# Patient Record
Sex: Female | Born: 1935 | Race: White | Hispanic: No | Smoking: Never smoker
Health system: Southern US, Community
[De-identification: ages and names within clinical notes are randomized; demographics above are authoritative.]

## PROBLEM LIST (undated history)

## (undated) DIAGNOSIS — I10 Essential (primary) hypertension: Secondary | ICD-10-CM

## (undated) DIAGNOSIS — I370 Nonrheumatic pulmonary valve stenosis: Secondary | ICD-10-CM

## (undated) DIAGNOSIS — I509 Heart failure, unspecified: Secondary | ICD-10-CM

## (undated) DIAGNOSIS — I442 Atrioventricular block, complete: Secondary | ICD-10-CM

## (undated) DIAGNOSIS — I4892 Unspecified atrial flutter: Secondary | ICD-10-CM

## (undated) DIAGNOSIS — F329 Major depressive disorder, single episode, unspecified: Secondary | ICD-10-CM

## (undated) DIAGNOSIS — F419 Anxiety disorder, unspecified: Secondary | ICD-10-CM

## (undated) DIAGNOSIS — R0602 Shortness of breath: Secondary | ICD-10-CM

## (undated) DIAGNOSIS — H409 Unspecified glaucoma: Secondary | ICD-10-CM

## (undated) DIAGNOSIS — F32A Depression, unspecified: Secondary | ICD-10-CM

## (undated) DIAGNOSIS — Z8774 Personal history of (corrected) congenital malformations of heart and circulatory system: Secondary | ICD-10-CM

## (undated) HISTORY — PX: AORTIC VALVE REPAIR: SHX6306

## (undated) HISTORY — DX: Personal history of (corrected) congenital malformations of heart and circulatory system: Z87.74

## (undated) HISTORY — DX: Unspecified atrial flutter: I48.92

## (undated) HISTORY — DX: Nonrheumatic pulmonary valve stenosis: I37.0

## (undated) HISTORY — DX: Unspecified glaucoma: H40.9

## (undated) HISTORY — PX: OTHER SURGICAL HISTORY: SHX169

---

## 1983-12-22 HISTORY — PX: OTHER SURGICAL HISTORY: SHX169

## 2000-05-03 ENCOUNTER — Encounter: Admission: RE | Admit: 2000-05-03 | Discharge: 2000-05-03 | Payer: Self-pay | Admitting: Family Medicine

## 2000-05-03 ENCOUNTER — Encounter: Payer: Self-pay | Admitting: Family Medicine

## 2001-05-04 ENCOUNTER — Encounter: Payer: Self-pay | Admitting: Family Medicine

## 2001-05-04 ENCOUNTER — Encounter: Admission: RE | Admit: 2001-05-04 | Discharge: 2001-05-04 | Payer: Self-pay | Admitting: Family Medicine

## 2002-05-09 ENCOUNTER — Encounter: Admission: RE | Admit: 2002-05-09 | Discharge: 2002-05-09 | Payer: Self-pay | Admitting: Family Medicine

## 2002-05-09 ENCOUNTER — Encounter: Payer: Self-pay | Admitting: Family Medicine

## 2003-05-14 ENCOUNTER — Encounter: Payer: Self-pay | Admitting: Family Medicine

## 2003-05-14 ENCOUNTER — Encounter: Admission: RE | Admit: 2003-05-14 | Discharge: 2003-05-14 | Payer: Self-pay | Admitting: Family Medicine

## 2004-05-14 ENCOUNTER — Encounter: Admission: RE | Admit: 2004-05-14 | Discharge: 2004-05-14 | Payer: Self-pay | Admitting: Family Medicine

## 2005-05-26 ENCOUNTER — Encounter: Admission: RE | Admit: 2005-05-26 | Discharge: 2005-05-26 | Payer: Self-pay | Admitting: Family Medicine

## 2005-12-21 DIAGNOSIS — I442 Atrioventricular block, complete: Secondary | ICD-10-CM

## 2005-12-21 HISTORY — DX: Atrioventricular block, complete: I44.2

## 2005-12-21 HISTORY — PX: PACEMAKER INSERTION: SHX728

## 2006-07-01 ENCOUNTER — Encounter: Admission: RE | Admit: 2006-07-01 | Discharge: 2006-07-01 | Payer: Self-pay | Admitting: Family Medicine

## 2006-07-08 ENCOUNTER — Encounter: Admission: RE | Admit: 2006-07-08 | Discharge: 2006-07-08 | Payer: Self-pay | Admitting: Family Medicine

## 2006-09-07 ENCOUNTER — Inpatient Hospital Stay (HOSPITAL_COMMUNITY): Admission: AD | Admit: 2006-09-07 | Discharge: 2006-09-10 | Payer: Self-pay | Admitting: Cardiology

## 2006-09-08 ENCOUNTER — Encounter (INDEPENDENT_AMBULATORY_CARE_PROVIDER_SITE_OTHER): Payer: Self-pay | Admitting: Cardiovascular Disease

## 2007-02-09 ENCOUNTER — Ambulatory Visit: Payer: Self-pay | Admitting: Ophthalmology

## 2007-03-23 ENCOUNTER — Ambulatory Visit: Payer: Self-pay | Admitting: Ophthalmology

## 2007-08-16 ENCOUNTER — Encounter: Admission: RE | Admit: 2007-08-16 | Discharge: 2007-08-16 | Payer: Self-pay | Admitting: Family Medicine

## 2008-08-16 ENCOUNTER — Encounter: Admission: RE | Admit: 2008-08-16 | Discharge: 2008-08-16 | Payer: Self-pay | Admitting: Family Medicine

## 2008-11-29 ENCOUNTER — Encounter: Admission: RE | Admit: 2008-11-29 | Discharge: 2008-11-29 | Payer: Self-pay | Admitting: Nurse Practitioner

## 2009-10-03 ENCOUNTER — Encounter: Admission: RE | Admit: 2009-10-03 | Discharge: 2009-10-03 | Payer: Self-pay | Admitting: Internal Medicine

## 2010-07-02 ENCOUNTER — Emergency Department (HOSPITAL_COMMUNITY): Admission: EM | Admit: 2010-07-02 | Discharge: 2010-07-02 | Payer: Self-pay | Admitting: Emergency Medicine

## 2010-07-12 ENCOUNTER — Inpatient Hospital Stay (HOSPITAL_COMMUNITY): Admission: EM | Admit: 2010-07-12 | Discharge: 2010-07-13 | Payer: Self-pay | Admitting: Emergency Medicine

## 2010-10-09 ENCOUNTER — Encounter: Admission: RE | Admit: 2010-10-09 | Discharge: 2010-10-09 | Payer: Self-pay | Admitting: Internal Medicine

## 2011-01-11 ENCOUNTER — Encounter: Payer: Self-pay | Admitting: Family Medicine

## 2011-03-07 LAB — CROSSMATCH
ABO/RH(D): A POS
Antibody Screen: NEGATIVE

## 2011-03-07 LAB — URINALYSIS, ROUTINE W REFLEX MICROSCOPIC
Bilirubin Urine: NEGATIVE
Glucose, UA: NEGATIVE mg/dL
Ketones, ur: NEGATIVE mg/dL
Nitrite: NEGATIVE
Protein, ur: NEGATIVE mg/dL
Specific Gravity, Urine: 1.011 (ref 1.005–1.030)
Urobilinogen, UA: 0.2 mg/dL (ref 0.0–1.0)
pH: 5 (ref 5.0–8.0)

## 2011-03-07 LAB — BASIC METABOLIC PANEL
BUN: 30 mg/dL — ABNORMAL HIGH (ref 6–23)
BUN: 35 mg/dL — ABNORMAL HIGH (ref 6–23)
CO2: 21 mEq/L (ref 19–32)
CO2: 25 mEq/L (ref 19–32)
Calcium: 9 mg/dL (ref 8.4–10.5)
Calcium: 9.1 mg/dL (ref 8.4–10.5)
Chloride: 105 mEq/L (ref 96–112)
Chloride: 109 mEq/L (ref 96–112)
Creatinine, Ser: 1.26 mg/dL — ABNORMAL HIGH (ref 0.4–1.2)
Creatinine, Ser: 1.44 mg/dL — ABNORMAL HIGH (ref 0.4–1.2)
GFR calc Af Amer: 43 mL/min — ABNORMAL LOW (ref >60)
GFR calc Af Amer: 50 mL/min — ABNORMAL LOW (ref >60)
GFR calc non Af Amer: 35 mL/min — ABNORMAL LOW (ref >60)
GFR calc non Af Amer: 41 mL/min — ABNORMAL LOW (ref >60)
Glucose, Bld: 110 mg/dL — ABNORMAL HIGH (ref 70–99)
Glucose, Bld: 131 mg/dL — ABNORMAL HIGH (ref 70–99)
Potassium: 4.3 mEq/L (ref 3.5–5.1)
Potassium: 4.3 mEq/L (ref 3.5–5.1)
Sodium: 139 mEq/L (ref 135–145)
Sodium: 141 mEq/L (ref 135–145)

## 2011-03-07 LAB — HEMOGLOBIN AND HEMATOCRIT, BLOOD
HCT: 24.7 % — ABNORMAL LOW (ref 36.0–46.0)
Hemoglobin: 8.6 g/dL — ABNORMAL LOW (ref 12.0–15.0)

## 2011-03-07 LAB — DIFFERENTIAL
Basophils Absolute: 0.1 10*3/uL (ref 0.0–0.1)
Basophils Relative: 1 % (ref 0–1)
Eosinophils Absolute: 0.2 10*3/uL (ref 0.0–0.7)
Eosinophils Relative: 2 % (ref 0–5)
Lymphocytes Relative: 14 % (ref 12–46)
Lymphs Abs: 1.4 10*3/uL (ref 0.7–4.0)
Monocytes Absolute: 0.8 10*3/uL (ref 0.1–1.0)
Monocytes Relative: 8 % (ref 3–12)
Neutro Abs: 7.7 10*3/uL (ref 1.7–7.7)
Neutrophils Relative %: 75 % (ref 43–77)

## 2011-03-07 LAB — COMPREHENSIVE METABOLIC PANEL
ALT: 14 U/L (ref 0–35)
AST: 20 U/L (ref 0–37)
Albumin: 3 g/dL — ABNORMAL LOW (ref 3.5–5.2)
Alkaline Phosphatase: 61 U/L (ref 39–117)
BUN: 25 mg/dL — ABNORMAL HIGH (ref 6–23)
CO2: 23 mEq/L (ref 19–32)
Calcium: 8.6 mg/dL (ref 8.4–10.5)
Chloride: 104 mEq/L (ref 96–112)
Creatinine, Ser: 1.02 mg/dL (ref 0.4–1.2)
GFR calc Af Amer: >60 mL/min (ref >60)
GFR calc non Af Amer: 53 mL/min — ABNORMAL LOW (ref >60)
Glucose, Bld: 109 mg/dL — ABNORMAL HIGH (ref 70–99)
Potassium: 3.8 mEq/L (ref 3.5–5.1)
Sodium: 135 mEq/L (ref 135–145)
Total Bilirubin: 1.5 mg/dL — ABNORMAL HIGH (ref 0.3–1.2)
Total Protein: 5.8 g/dL — ABNORMAL LOW (ref 6.0–8.3)

## 2011-03-07 LAB — CBC
HCT: 24.7 % — ABNORMAL LOW (ref 36.0–46.0)
HCT: 27.6 % — ABNORMAL LOW (ref 36.0–46.0)
Hemoglobin: 8.4 g/dL — ABNORMAL LOW (ref 12.0–15.0)
Hemoglobin: 9.6 g/dL — ABNORMAL LOW (ref 12.0–15.0)
MCH: 33.4 pg (ref 26.0–34.0)
MCH: 33.7 pg (ref 26.0–34.0)
MCHC: 34.1 g/dL (ref 30.0–36.0)
MCHC: 34.8 g/dL (ref 30.0–36.0)
MCV: 96.9 fL (ref 78.0–100.0)
MCV: 97.9 fL (ref 78.0–100.0)
Platelets: 243 10*3/uL (ref 150–400)
Platelets: 262 10*3/uL (ref 150–400)
RBC: 2.53 MIL/uL — ABNORMAL LOW (ref 3.87–5.11)
RBC: 2.85 MIL/uL — ABNORMAL LOW (ref 3.87–5.11)
RDW: 14.4 % (ref 11.5–15.5)
RDW: 14.6 % (ref 11.5–15.5)
WBC: 10.3 10*3/uL (ref 4.0–10.5)
WBC: 8.8 10*3/uL (ref 4.0–10.5)

## 2011-03-07 LAB — URINE MICROSCOPIC-ADD ON

## 2011-03-07 LAB — PROTIME-INR
INR: 1.06 (ref 0.00–1.49)
INR: 1.2 (ref 0.00–1.49)
INR: 1.22 (ref 0.00–1.49)
INR: 1.28 (ref 0.00–1.49)
Prothrombin Time: 13.7 seconds (ref 11.6–15.2)
Prothrombin Time: 15.1 seconds (ref 11.6–15.2)
Prothrombin Time: 15.3 seconds — ABNORMAL HIGH (ref 11.6–15.2)
Prothrombin Time: 15.9 seconds — ABNORMAL HIGH (ref 11.6–15.2)

## 2011-03-07 LAB — APTT: aPTT: 32 seconds (ref 24–37)

## 2011-03-07 LAB — ABO/RH: ABO/RH(D): A POS

## 2011-05-08 NOTE — Cardiovascular Report (Signed)
NAMEMARSHIA, [name] Baird                 ACCOUNT NO.:  [id number]   MEDICAL RECORD NO.:  [id number]          PATIENT TYPE:  INP   LOCATION:  2902                         FACILITY:  [facility]   PHYSICIAN:  Thereasa Solo. Little, M.D. DATE OF BIRTH:  [birth year]   DATE OF PROCEDURE:  09/08/2006  DATE OF DISCHARGE:                              CARDIAC CATHETERIZATION   INDICATIONS FOR TEST:  This 77 year old female presented to the office with  marked tiredness and fatigue and bradycardia.  She had a pulse of 34.  She  was hospitalized and subsequently has developed second-degree AV block with  a ventricular rate of 42 and an atrial rate of 84 with every other beat  being conducted.  She has a history of congenital pulmonary artery stenosis  and in 1985 underwent valvulotomy by Dr. Nedra Hai.  Prior to her surgery, her  pulmonic gradient was 110 mmHg.   She is brought to the cath lab for right and left heart catheterization to  make sure she does not have underlying ischemia as an etiology of her second-  degree AV block/bradycardia.  The right heart is done to evaluate her  pulmonary artery pressures and whether or not there is residual/worsening  pulmonic stenosis.   After obtaining informed consent, the patient was prepped and draped in the  usual sterile fashion exposing the right groin.  Following local anesthetic  with 1% Xylocaine, the Seldinger technique was employed a 5-French  introducer sheath was placed in the right femoral artery and an 8-French  introducer sheath in the right femoral vein.  The Swan-Ganz catheter was  advanced through its normal route into the left ventricle.  Hemodynamic  monitoring was undertaken throughout each station.  It was very difficult to  cross the pulmonic valve and finally with the aid of a Swan wire (025).  The  Swan-Ganz catheter was placed into the left pulmonary artery.  This appeared  to be a very large vessel.  Cardiac output by thermal dilution was  performed.   A pigtail catheter was then placed into the left ventricular cavity and  simultaneous wedge LV determination were performed.  Ventriculography and  aortic root and selective evaluation of her coronary arteries were then  performed.   COMPLICATIONS:  None.   TOTAL CONTRAST:  110 mL.   EQUIPMENT:  An 8-French Swan-Ganz catheter, 5-French Judkins configuration  catheters.   INTERESTING FINDINGS:  The right coronary artery comes off the midportion of  the LAD.   RESULTS:  1. Hemodynamic monitoring:  Central aortic pressure was 164/61, left      ventricular pressure was 165/7, and there was no significant gradient      at the time of pullback.  2. Right atrial pressure 12, right ventricular pressure 40/5, pulmonary      artery pressure 22/12, and a wedge of 12.   There was a 17 mm gradient across the pulmonic valve.  Attempts at a  pullback across the valve, unfortunately, were not recorded on paper for  evaluation.   Cardiac output by thermal dilution was 2.9 liters per minute, bearing  in  mind the patient's heart rate was 42 in second-degree AV block.   1. Ventriculography:  Ventriculography in the RAO projection using 20 mL      of contrast at 12 mL per second revealed a normal size LV with normal      systolic function.  The ejection fraction was in excess of 60%.  There      appeared to be +2 mitral regurgitation with the left atrium being      dilated.  The left ventricular end-diastolic pressure was 21.  2. Aortic root:  The aortic root appeared to be normal in diameter.  There      was +1 aortic insufficiency.  3. Coronary arteriography:  No calcification was appreciated on      fluoroscopy, but on the aortic root injection there was no evidence of      a right coronary artery.   1. Left main.  This was about a 6-mm vessel and bifurcated and was normal.  2. Circumflex:  The circumflex was a dominant vessel giving rise to 2 OMs      and a large PDA, all of  which were free of disease.  3. LAD:  The LAD with to the apex of the heart and gave rise to a large      first diagonal, which was free of disease as was the LAD.  The right      coronary came off the LAD at the takeoff of the septal perforator.  The      right coronary artery appeared to supply only the RV portion and did      not appear to supply the posterior septum.  There was a 30% area of      narrowing in the midportion of this vessel.   CONCLUSION:  1. Normal LV systolic function.  2. Dilatation of the left atrium.  3. +2 mitral regurgitation  4. +1 AI.  5. A 17-mm gradient across the pulmonary valve.  6. Second-degree AV block with a heart rate of 42.  7. 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  8. No high-grade stenosis in her coronary anatomy.   The patient will need a pacemaker for the second-degree AV block.           ______________________________  Thereasa Solo. Little, M.D.     ABL/MEDQ  D:  09/08/2006  T:  09/09/2006  Job:  [id number]   cc:   Madaline Savage, M.D.  Donia Guiles, M.D.  Cath Lab

## 2011-05-08 NOTE — Discharge Summary (Signed)
[name], [name] Baird                 ACCOUNT NO.:  [id number]   MEDICAL RECORD NO.:  [id number]          PATIENT TYPE:  INP   LOCATION:  2034                         FACILITY:  [facility]   PHYSICIAN:  Cristy Hilts. Jacinto Halim, MD       DATE OF BIRTH:  [birth year]   DATE OF ADMISSION:  09/07/2006  DATE OF DISCHARGE:  09/10/2006                               DISCHARGE SUMMARY   PRIMARY CARDIOLOGIST:  Madaline Savage, M.D.   DISCHARGE DIAGNOSES:  1. Shortness of breath.  2. Exertional chest discomfort.  3. Bradycardia, 2:1 atrioventricular (AV) block.  4. Right bundle-branch block.  5. Status post cardiac catheterization revealing normal coronary      arteries with a 30% lesion in the right coronary artery.  6. Normal left ventricular function.  7. Dilated left atrium.  8. 2+ mitral regurgitation.  9. 1+ aortic insufficiency.  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  11.Status post placement of Medtronic Adapta pacemaker with Dual-      Chamber pacemaker for AV block and symptomatic bradycardia.  12.History of atrial septal defect.  13.History of pulmonic stenosis.  14.Status post pulmonic valvulotomy by Dr. Nedra Hai.   HISTORY:  [name] is a very pleasant, 77 year old white female who  presented to our office on the day of admission with weakness, fatigue  and bradycardia.  She had also noted some shortness of breath with  exertion as well as some chest tightness with physical activity.  EKG at  that time revealed bradycardia with a heart rate of 34 and a right  bundle-branch block.  She was admitted to [facility] for further  evaluation and treatment.  [name] was admitted to stepdown unit and  subsequently underwent cardiac catheterization which revealed no  evidence of critical stenosis as a cause for her new bradycardia.  At  that time, a decision was made to proceed with pacemaker implant on  September 09, 2006,  this was performed by Dr. Lenise Herald.  A  Medtronic Adapta pacemaker was placed, atrial and ventricular leads were  placed in the right chest, the device was functioning normally  postprocedure and there was no complication.  On September 10, 2006, she  felt well without any complaints of chest pain or shortness of breath.  The pacer pocket was without hematoma and chest x-ray revealed good lead  placement without pneumothorax.  Her EKG revealed a sensed V-paced  rhythm.   DISCHARGE INSTRUCTIONS:  She is to avoid driving for the next week and  no lifting objects greater than five pounds for the next six weeks.  She  is to avoid raising her right arm above her head.  She is to increase  her activities slowly.  She is to keep her incision clean and dry and  call us with any increased pain, redness, swelling or drainage from the  pacemaker insertion site or with onset of fever or chills.   DISCHARGE MEDICATIONS:  Include:  1. Cosopt and Travatan eye drops.  2. 20  mg daily.  3. Aspirin 81 mg daily.  4. Potassium daily.  5. Lorazepam .5 mg p.r.n.   She is to follow up with Dr. Elsie Lincoln in three to four weeks and have her  wound checked in one week, our office will contact her with dates and  times.     ______________________________  Charmian Muff, NP      Cristy Hilts Jacinto Halim, MD  Electronically Signed    LS/MEDQ  D:  01/03/2007  T:  01/04/2007  Job:  [id number]

## 2011-05-08 NOTE — Op Note (Signed)
[name] Baird, [name] Baird                 ACCOUNT NO.:  [id number]   MEDICAL RECORD NO.:  [id number]          PATIENT TYPE:  INP   LOCATION:  2902                         FACILITY:  [facility]   PHYSICIAN:  Darlin Priestly, MD  DATE OF BIRTH:  07-31-1933   DATE OF PROCEDURE:  09/09/2006  DATE OF DISCHARGE:                                 OPERATIVE REPORT   PROCEDURE:  Insertion of a Medtronic Adapta ADDR01 generator, serial number  [id number] with passive atrial and ventricular leads.   ATTENDING SURGEON:  Darlin Priestly, MD   COMPLICATIONS:  None.   INDICATIONS:  [name] Baird is a 77 year old female patient of Dr. Caprice Kluver  and Dr. Donia Guiles, with a history of ASD with pulmonic stenosis status  post a pulmonic valvulotomy by Dr. Nedra Hai.  She is also known to have moderate  aortic insufficiency with mildly depressed EF of 45% to 50% as well as mild  pulmonic regurgitation and mild-to-moderate MR.  She has had symptomatic  bradycardia since mid September.  She was admitted for fatigue and weakness  and found to be bradycardic with heart rates in the 30s.  She underwent a  cardiac catheterization by Dr. Caprice Kluver on September 19, revealing  anomalous takeoff of the RCA from the LAD but no significant CAD with normal  LV function.  She had normal right heart pressures.  She was noted to have  pulmonic valve gradient of 17 mmHg.  She is now referred for a dual-chamber  pacer implant secondary to symptomatic bradycardia and second degree AV  block, type II.   DESCRIPTION OF OPERATION:  After getting informed consent, patient was  brought to the cardiac cath lab where the right chest was prepped and draped  in sterile fashion.  ECG monitoring was established. Then 1% lidocaine was  then used to anesthetize the right mid subclavicular area.  Next,  approximately 3-cm mid infraclavicular, horizontal incision was carried out  and hemostasis was obtained with electrocautery.  Blunt dissection  used to  carry this down to the right pectoral fascia.  Next, approximately 3 x 4 cm  pocket was then created over the right pectoral fascia.  Again hemostasis  was obtained with electrocautery.  The right subclavian vein was then easily  entered and a guide wire was then easily passed into the SVC and right  atrium.  A second right subclavian stick was then performed.  A second guide  wire was then easily passed into the SVC and right atrium.  Two silk sutures  were then placed at the base of each retained guide wire.  Next, a #7 [place] was then placed over the first guide wire and dilator and guide  wire were then removed.  Through this, a 52-cm passive Medtronic lead, model  number [id number], serial number [id number] was then easily passed into the SVC  and right atrium and the peel-away sheath was removed.  A second 7 French  Safe Sheath was inserted over the second retained guide wire, and the guide  wire and dilator removed.  Through  this, a 45-cm passive atrial lead, model  number 4592, serial number [id number] was then placed into the right atrium.  Peel-away sheath was removed.  A J curve was then placed and ventricular  lead was dilated and ventricular lead was then prolapsed through the  tricuspid valve and positioned in the RV apex.  Threshold was then  determined.  R waves were measured at 7.4 millivolts.  Impedance was 569  ohms.  Threshold in the ventricle was 0.6 volts at 0.5 milliseconds.  Current was 1.3 milliamps.  Ten-volts negative for diaphragmatic  stimulation.  The atrial lead was then positioned in the right atrial  appendage and threshold was determined.  P waves were measured at 6.1  millivolts.  Impedance was 476 ohms.  Threshold in the atrium was 0.6 volts  at 0.5 milliseconds.  Current was 1.5 milliamps.  Again, 10 volts negative  for diaphragmatic stimulation.  The leads were then anchored to the pectoral  fascia with two silk sutures per lead.   The  pocket was then copiously irrigated with 1% clindamycin solution.  The  leads were then connected in serial fashion to the Medtronic Adapta  generator, model number [id number], serial number [id number].  Head screws were  tightened and pacing was confirmed.  A single silk suture was then placed in  the apex of the pocket.  The generator and leads were then delivered into  the pocket and the header was secured with silk suture.  The subcutaneous  layer was then closed with running 2-0 Vicryl.  The skin layer was then  closed with running 4-0 Vicryl.  Steri-Strips were applied.  The patient was  returned to the recovery room in stable condition.   CONCLUSION:  Successful implant of a Medtronic Adapta [id number] generator,  serial number [id number] with passive atrial and ventricular leads.      Darlin Priestly, MD  Electronically Signed     RHM/MEDQ  D:  09/09/2006  T:  09/09/2006  Job:  [id number]   cc:   Thereasa Solo. Little, M.D.  Donia Guiles, M.D.

## 2011-09-03 ENCOUNTER — Other Ambulatory Visit: Payer: Self-pay | Admitting: Internal Medicine

## 2011-09-03 DIAGNOSIS — Z1231 Encounter for screening mammogram for malignant neoplasm of breast: Secondary | ICD-10-CM

## 2011-10-13 ENCOUNTER — Ambulatory Visit
Admission: RE | Admit: 2011-10-13 | Discharge: 2011-10-13 | Disposition: A | Discharge status: Home / Self Care | Payer: Medicare Other | Source: Ambulatory Visit | Attending: Internal Medicine | Admitting: Internal Medicine

## 2011-10-13 DIAGNOSIS — Z1231 Encounter for screening mammogram for malignant neoplasm of breast: Secondary | ICD-10-CM

## 2012-09-19 ENCOUNTER — Other Ambulatory Visit: Payer: Self-pay | Admitting: Internal Medicine

## 2012-09-19 DIAGNOSIS — Z1231 Encounter for screening mammogram for malignant neoplasm of breast: Secondary | ICD-10-CM

## 2012-10-18 ENCOUNTER — Ambulatory Visit
Admission: RE | Admit: 2012-10-18 | Discharge: 2012-10-18 | Disposition: A | Discharge status: Home / Self Care | Payer: PRIVATE HEALTH INSURANCE | Source: Ambulatory Visit | Attending: Internal Medicine | Admitting: Internal Medicine

## 2012-10-18 DIAGNOSIS — Z1231 Encounter for screening mammogram for malignant neoplasm of breast: Secondary | ICD-10-CM

## 2013-02-22 LAB — PACEMAKER DEVICE OBSERVATION

## 2013-03-16 ENCOUNTER — Other Ambulatory Visit: Payer: Self-pay | Admitting: Cardiovascular Disease

## 2013-03-17 NOTE — Addendum Note (Signed)
Addended by: Thurmon Fair on: 03/17/2013 08:03 AM   Modules accepted: Orders

## 2013-03-24 ENCOUNTER — Encounter (HOSPITAL_COMMUNITY): Payer: Self-pay | Admitting: Anesthesiology

## 2013-03-24 ENCOUNTER — Ambulatory Visit (HOSPITAL_COMMUNITY): Payer: PRIVATE HEALTH INSURANCE | Admitting: Anesthesiology

## 2013-03-24 ENCOUNTER — Encounter (HOSPITAL_COMMUNITY): Payer: Self-pay | Admitting: Gastroenterology

## 2013-03-24 ENCOUNTER — Ambulatory Visit (HOSPITAL_COMMUNITY)
Admission: RE | Admit: 2013-03-24 | Discharge: 2013-03-24 | Disposition: A | Discharge status: Home / Self Care | Payer: PRIVATE HEALTH INSURANCE | Source: Ambulatory Visit | Attending: Cardiovascular Disease | Admitting: Cardiovascular Disease

## 2013-03-24 ENCOUNTER — Encounter (HOSPITAL_COMMUNITY)
Admission: RE | Disposition: A | Discharge status: Home / Self Care | Payer: Self-pay | Source: Ambulatory Visit | Attending: Cardiovascular Disease

## 2013-03-24 DIAGNOSIS — Z950 Presence of cardiac pacemaker: Secondary | ICD-10-CM | POA: Insufficient documentation

## 2013-03-24 DIAGNOSIS — I442 Atrioventricular block, complete: Secondary | ICD-10-CM | POA: Insufficient documentation

## 2013-03-24 DIAGNOSIS — I4891 Unspecified atrial fibrillation: Secondary | ICD-10-CM | POA: Insufficient documentation

## 2013-03-24 DIAGNOSIS — I4892 Unspecified atrial flutter: Secondary | ICD-10-CM | POA: Insufficient documentation

## 2013-03-24 HISTORY — DX: Essential (primary) hypertension: I10

## 2013-03-24 HISTORY — DX: Depression, unspecified: F32.A

## 2013-03-24 HISTORY — DX: Heart failure, unspecified: I50.9

## 2013-03-24 HISTORY — DX: Major depressive disorder, single episode, unspecified: F32.9

## 2013-03-24 HISTORY — DX: Shortness of breath: R06.02

## 2013-03-24 HISTORY — PX: CARDIOVERSION: SHX1299

## 2013-03-24 HISTORY — DX: Anxiety disorder, unspecified: F41.9

## 2013-03-24 SURGERY — CARDIOVERSION
Anesthesia: Monitor Anesthesia Care

## 2013-03-24 MED ORDER — LIDOCAINE HCL (CARDIAC) 20 MG/ML IV SOLN
INTRAVENOUS | Status: DC | PRN
  Administered 2013-03-24: 30 mg via INTRAVENOUS

## 2013-03-24 MED ORDER — PROPOFOL 10 MG/ML IV BOLUS
INTRAVENOUS | Status: DC | PRN
  Administered 2013-03-24: 50 mg via INTRAVENOUS

## 2013-03-24 MED ORDER — SODIUM CHLORIDE 0.9 % IV SOLN
INTRAVENOUS | Status: DC
  Administered 2013-03-24: 500 mL via INTRAVENOUS
  Administered 2013-03-24: 13:00:00 via INTRAVENOUS

## 2013-03-24 NOTE — Anesthesia Postprocedure Evaluation (Signed)
  Anesthesia Post-op Note  Patient: [name] Baird  Procedure(s) Performed: Procedure(s): CARDIOVERSION (N/A)  Patient Location: PACU  Anesthesia Type:General  Level of Consciousness: awake, sedated and patient cooperative  Airway and Oxygen Therapy: Patient Spontanous Breathing and Patient connected to nasal cannula oxygen  Post-op Pain: none  Post-op Assessment: Post-op Vital signs reviewed, Patient's Cardiovascular Status Stable, Respiratory Function Stable, Patent Airway and No signs of Nausea or vomiting  Post-op Vital Signs: Reviewed and stable  Complications: No apparent anesthesia complications

## 2013-03-24 NOTE — Anesthesia Postprocedure Evaluation (Signed)
  Anesthesia Post-op Note  Patient: [name] Baird  Procedure(s) Performed: Procedure(s): CARDIOVERSION (N/A)  Patient Location: PACU and Endoscopy Unit  Anesthesia Type:General  Level of Consciousness: awake, alert  and oriented  Airway and Oxygen Therapy: Patient Spontanous Breathing and Patient connected to nasal cannula oxygen  Post-op Pain: none  Post-op Assessment: Post-op Vital signs reviewed, Patient's Cardiovascular Status Stable, Respiratory Function Stable and Patent Airway  Post-op Vital Signs: stable  Complications: No apparent anesthesia complications

## 2013-03-24 NOTE — Transfer of Care (Signed)
Immediate Anesthesia Transfer of Care Note  Patient: [name] Baird  Procedure(s) Performed: Procedure(s): CARDIOVERSION (N/A)  Patient Location: PACU  Anesthesia Type:General  Level of Consciousness: awake, sedated and patient cooperative  Airway & Oxygen Therapy: Patient Spontanous Breathing and Patient connected to nasal cannula oxygen  Post-op Assessment: Report given to PACU RN and Post -op Vital signs reviewed and stable  Post vital signs: Reviewed and stable  Complications: No apparent anesthesia complications

## 2013-03-24 NOTE — Anesthesia Preprocedure Evaluation (Addendum)
Anesthesia Evaluation  Patient identified by MRN, date of birth, ID band Patient awake    Reviewed: Allergy & Precautions, H&P , NPO status , Patient's Chart, lab work & pertinent test results, reviewed documented beta blocker date and time   Airway Mallampati: II TM Distance: >3 FB Neck ROM: Full    Dental  (+) Teeth Intact   Pulmonary shortness of breath and at rest,  breath sounds clear to auscultation        Cardiovascular hypertension, +CHF + dysrhythmias Atrial Fibrillation + pacemaker Rhythm:Irregular Rate:Normal  Pacer Tech present    Neuro/Psych    GI/Hepatic   Endo/Other    Renal/GU      Musculoskeletal   Abdominal   Peds  Hematology   Anesthesia Other Findings Assessment originally done at 1350  Reproductive/Obstetrics                         Anesthesia Physical Anesthesia Plan  ASA: III  Anesthesia Plan: General   Post-op Pain Management:    Induction: Intravenous  Airway Management Planned: Mask  Additional Equipment:   Intra-op Plan:   Post-operative Plan:   Informed Consent: I have reviewed the patients History and Physical, chart, labs and discussed the procedure including the risks, benefits and alternatives for the proposed anesthesia with the patient or authorized representative who has indicated his/her understanding and acceptance.     Plan Discussed with: CRNA and Surgeon  Anesthesia Plan Comments:         Anesthesia Quick Evaluation

## 2013-03-24 NOTE — H&P (Signed)
  Date of Initial H&P: 03/15/13  History reviewed, patient examined, no change in status, stable for elective DC CV for symptomatic atrial flutter. Pacemaker dependent due to complete heart block, poorly tolerant of loss of AV synchrony. This procedure has been fully reviewed with the patient and written informed consent has been obtained.  Thurmon Fair, MD, [facility] [phone] office [phone] pager 03/24/2013 1:47 PM

## 2013-03-24 NOTE — CV Procedure (Signed)
[name] Baird, [place] Female, 79 y.o., 09/07/1933  Location: [facility]  Bed: NONE  MRN: [id number]  CSN: [id number]  Admit Dt: 03/24/13   Procedure: Electrical Cardioversion Indications:  Atrial Fibrillation and Atrial Flutter Complete heart block, pacemaker-dependent  Procedure Details:  Consent: Risks of procedure as well as the alternatives and risks of each were explained to the (patient/caregiver).  Consent for procedure obtained.  Time Out: Verified patient identification, verified procedure, site/side was marked, verified correct patient position, special equipment/implants available, medications/allergies/relevent history reviewed, required imaging and test results available.  Performed  Patient placed on cardiac monitor, pulse oximetry, supplemental oxygen as necessary.  Sedation given: Propofol IV Pacer pads placed anterior and posterior chest. The anterior pad was placed at the apex. The pacemaker is located in the right subclavian area.  Cardioverted 1 time(s).  Cardioverted at 150J. Synchronized biphasic  Evaluation: Findings: Post procedure EKG shows: Atrial pacing with complete heart block; Ventricular pacing did not resume for approximately 30 seconds after the shock. Full device check showed normal function and a ventriculat pacing threshold of 0.75V@0 .4 ms pulse width, unchanged from preprocedure. Complications: no other complications Patient did tolerate procedure well.  Time Spent Directly with the Patient:  45 minutes   [name] Baird 03/24/2013, 1:48 PM

## 2013-03-24 NOTE — Preoperative (Addendum)
Beta Blockers   Reason not to administer Beta Blockers:Bystolic 10 a.m.

## 2013-03-27 ENCOUNTER — Encounter (HOSPITAL_COMMUNITY): Payer: Self-pay | Admitting: Cardiovascular Disease

## 2013-03-28 LAB — PACEMAKER DEVICE OBSERVATION

## 2013-04-14 ENCOUNTER — Institutional Professional Consult (permissible substitution): Payer: PRIVATE HEALTH INSURANCE | Admitting: Internal Medicine

## 2013-04-29 ENCOUNTER — Encounter: Payer: Self-pay | Admitting: Cardiovascular Disease

## 2013-05-07 DIAGNOSIS — I4891 Unspecified atrial fibrillation: Secondary | ICD-10-CM

## 2013-05-07 DIAGNOSIS — I442 Atrioventricular block, complete: Secondary | ICD-10-CM

## 2013-05-07 DIAGNOSIS — I509 Heart failure, unspecified: Secondary | ICD-10-CM

## 2013-05-07 LAB — PACEMAKER DEVICE OBSERVATION

## 2013-05-08 ENCOUNTER — Telehealth: Payer: Self-pay | Admitting: Cardiovascular Disease

## 2013-05-08 DIAGNOSIS — I4891 Unspecified atrial fibrillation: Secondary | ICD-10-CM

## 2013-05-08 NOTE — Telephone Encounter (Signed)
Pt's daughter called and states that Mrs. Eyer was told if she had any problems to please do a pacer download.  She did this over the weekend.  Also, pt is not feeling well and thinks it may be the new medication Dr. Royann Shivers gave her.

## 2013-05-08 NOTE — Telephone Encounter (Signed)
Spoke to [facility] regarding mother's MDT [facility] Dgtr informed that pt has been in AF/AFL, which could explain mother's sxms. V.O. From Dr. Royann Shivers was to refer pt back to Dr. Graciela Husbands to discuss an ablation. No changes were made to medications. Dgtr voiced understanding. Referral was sent to Dr. Graciela Husbands.

## 2013-05-08 NOTE — Telephone Encounter (Signed)
Returned call.  [name] stated they did a pacemaker download over the weekend.  Stated pt is not feeling well.  Stated pt c/o feeling SOB when walking and tightness in chest.  Stated pt said she feels a lot better this morning.  Also stated they think it may be related the new medication Dr. Salena Saner prescribed on May 7th.  Informed [name] that [place], CMA has been notified and will discuss with Dr. Salena Saner and call her back with further instructions.  [name] verbalized understanding and asked that she be overhead paged when the call is returned.

## 2013-05-22 ENCOUNTER — Encounter: Payer: Self-pay | Admitting: *Deleted

## 2013-05-31 ENCOUNTER — Other Ambulatory Visit: Payer: Self-pay | Admitting: *Deleted

## 2013-05-31 MED ORDER — LOSARTAN POTASSIUM 50 MG PO TABS: 50.0000 mg | ORAL_TABLET | Freq: Every day | ORAL | Status: DC

## 2013-06-07 ENCOUNTER — Other Ambulatory Visit: Payer: Self-pay | Admitting: Cardiovascular Disease

## 2013-06-08 ENCOUNTER — Ambulatory Visit (INDEPENDENT_AMBULATORY_CARE_PROVIDER_SITE_OTHER): Payer: PRIVATE HEALTH INSURANCE | Admitting: Internal Medicine

## 2013-06-08 ENCOUNTER — Encounter: Payer: Self-pay | Admitting: Internal Medicine

## 2013-06-08 VITALS — BP 110/70 | HR 72 | Ht 64.0 in | Wt 151.0 lb

## 2013-06-08 DIAGNOSIS — Z9889 Other specified postprocedural states: Secondary | ICD-10-CM

## 2013-06-08 DIAGNOSIS — Z8774 Personal history of (corrected) congenital malformations of heart and circulatory system: Secondary | ICD-10-CM | POA: Insufficient documentation

## 2013-06-08 DIAGNOSIS — I370 Nonrheumatic pulmonary valve stenosis: Secondary | ICD-10-CM

## 2013-06-08 DIAGNOSIS — I4892 Unspecified atrial flutter: Secondary | ICD-10-CM

## 2013-06-08 DIAGNOSIS — I442 Atrioventricular block, complete: Secondary | ICD-10-CM

## 2013-06-08 DIAGNOSIS — I379 Nonrheumatic pulmonary valve disorder, unspecified: Secondary | ICD-10-CM

## 2013-06-08 DIAGNOSIS — Z950 Presence of cardiac pacemaker: Secondary | ICD-10-CM | POA: Insufficient documentation

## 2013-06-08 LAB — PACEMAKER DEVICE OBSERVATION
AL IMPEDENCE PM: 494 Ohm
ATRIAL PACING PM: 90
BATTERY VOLTAGE: 2.73 V
RV LEAD IMPEDENCE PM: 749 Ohm
VENTRICULAR PACING PM: 98

## 2013-06-08 NOTE — Progress Notes (Signed)
ELECTROPHYSIOLOGY CONSULT NOTE  Patient ID: [name] Baird, MRN: [id number], DOB/AGE: May 29, 1933 79 y.o. Admit date: (Not on file) Date of Consult: 06/08/2013  Primary Physician: Tomi Bamberger, NP Primary Cardiologist:  [facility] Chief Complaint: afLUTTER   HPI [name] Baird is a 79 y.o. female   with a history of a prior pacemaker implantation referred for consideration of ablation of atrial flutter.  She has a complex cardiac history; she has a history of pulmonic stenosis with a prior pulmonary valvotomy in 1985.she also at that time had repair of an ASD. She also has mild aortic insufficiency pulmonic insufficiency and mitral insufficiency.  She was found to be in atrial flutter. She was started on Rivaroxaban an attempt to pace terminate failed. She underwent cardioversion April 2014  She reverted a few weeks later. It was her impression and her daughter's impression that she felt significantly better while in sinus rhythm.     Past Medical History  Diagnosis Date  . Hypertension   . Dysrhythmia     aflutter  . Pacemaker 2007    medtronic  . Depression   . Anxiety   . CHF (congestive heart failure)   . Shortness of breath   . Valvular heart disease   . Glaucoma       Surgical History:  Past Surgical History  Procedure Laterality Date  . Aortic valve repair    . Pulmonary valvotomy  1985  . Trigeminal sx      left side  . Cardioversion N/A 03/24/2013    Procedure: CARDIOVERSION;  Surgeon: Thurmon Fair, MD;  Location: [facility];  Service: Cardiovascular;  Laterality: N/A;  . Pacemaker insertion  2007    Medtronic Adapta ADDRL1, serial # [id number]     Home Meds: Prior to Admission medications   Medication Sig Start Date End Date Taking? Authorizing Provider  clonazePAM (KLONOPIN) 0.5 MG tablet Take 0.5 mg by mouth daily.    Historical Provider, MD  furosemide (LASIX) 40 MG tablet TAKE 1 TABLET DAILY AS DIRECTED 06/07/13   Mihai Croitoru, MD  latanoprost (XALATAN)  0.005 % ophthalmic solution Place 1 drop into both eyes at bedtime.    Historical Provider, MD  losartan (COZAAR) 50 MG tablet Take 1 tablet (50 mg total) by mouth daily. 05/31/13   Mihai Croitoru, MD  Multiple Vitamins-Minerals (MULTIVITAMIN PO) Take 1 tablet by mouth.    Historical Provider, MD  nebivolol (BYSTOLIC) 10 MG tablet Take 10 mg by mouth daily.    Historical Provider, MD  pilocarpine (PILOCAR) 1 % ophthalmic solution Place 1 drop into the left eye 4 (four) times daily.    Historical Provider, MD  potassium chloride (K-DUR,KLOR-CON) 10 MEQ tablet Take 10 mEq by mouth 3 (three) times daily.    Historical Provider, MD  rivaroxaban (XARELTO) 10 MG TABS tablet Take 20 mg by mouth daily.    Historical Provider, MD  rosuvastatin (CRESTOR) 10 MG tablet Take 10 mg by mouth at bedtime.    Historical Provider, MD  timolol (TIMOPTIC) 0.5 % ophthalmic solution Place 1 drop into both eyes 2 (two) times daily.    Historical Provider, MD  venlafaxine (EFFEXOR) 75 MG tablet Take 75 mg by mouth 2 (two) times daily.    Historical Provider, MD    *  Allergies:  Allergies  Allergen Reactions  . Sulfa Antibiotics Other (See Comments)    welps   . Hydrocodone     nausea    History   Social History  . Marital  Status: Widowed    Spouse Name: N/A    Number of Children: 2  . Years of Education: N/A   Occupational History  . Not on file.   Social History Main Topics  . Smoking status: Never Smoker   . Smokeless tobacco: Not on file  . Alcohol Use: No  . Drug Use: No  . Sexually Active: No   Other Topics Concern  . Not on file   Social History Narrative  . No narrative on file     No family history on file.   ROS:  Please see the history of present illness.     All other systems reviewed and negative.    Physical Exam:   BP 110/70  Pulse 72  Ht 5\' 4"  (1.626 m)  Wt 151 lb (68.493 kg)  BMI 25.91 kg/m2  General: Well developed, well nourished female in no acute  distress. Head: Normocephalic, atraumatic, sclera non-icteric, no xanthomas, nares are without discharge. EENT: normal Lymph Nodes:  none Back: without scoliosis/kyphosis, no CVA tendersness Neck: Negative for carotid bruits. JVD not elevated. Lungs: Clear bilaterally to auscultation without wheezes, rales, or rhonchi. Breathing is unlabored. Heart: RRR with S1 S2. 2/6  murmur , rubs, or gallops appreciated. Abdomen: Soft, non-tender, non-distended with normoactive bowel sounds. No hepatomegaly. No rebound/guarding. No obvious abdominal masses. Msk:  Strength and tone appear normal for age. Extremities: No clubbing or cyanosis. No  edema.  Distal pedal pulses are 2+ and equal bilaterally. Skin: Warm and Dry Neuro: Alert and oriented X 3. CN III-XII intact Grossly normal sensory and motor function . Psych:  Responds to questions appropriately with a normal affect.      Labs: Cardiac Enzymes No results found for this basename: CKTOTAL, CKMB, TROPONINI,  in the last 72 hours CBC Lab Results  Component Value Date   WBC 8.8 07/13/2010   HGB 9.6* 07/13/2010   HCT 27.6* 07/13/2010   MCV 96.9 07/13/2010   PLT 243 07/13/2010    Radiology/Studies:  No results found.  EKG:  Atrial flutter with typical wave form   Complete heart block  V pacing  Assessment and Plan:    [name] Baird

## 2013-06-08 NOTE — Assessment & Plan Note (Signed)
As above.

## 2013-06-08 NOTE — Assessment & Plan Note (Addendum)
The patient has atrial flutter which is symptomatic. She is on anticoagulation. Cardioversion and dronaderone have failed to maintain sinus rhythm  As previously, I tried to pace terminate today and was unsuccessful  Electrocardiogram suggests atypical flutter pattern however, with her prior atriotomy for her ASD repair, I'm concerned about the circuit. I have reviewed this with her and her family and have suggested that utilizing complex mapping might be to her advantage in the event that it is not isthmus dependent and rather is related to her atriotomy. To that end, I will review the strips in a store with Dr. Fawn Kirk and will get back with her about scheduling an ablation with him \  As her rhtyhm is persistent, will also have her stop her dronaderone.

## 2013-06-08 NOTE — Assessment & Plan Note (Signed)
Stable post pacing 

## 2013-06-08 NOTE — Assessment & Plan Note (Signed)
The patient's device was interrogated.  The information was reviewed. No changes were made in the programming.    

## 2013-06-15 ENCOUNTER — Telehealth: Payer: Self-pay | Admitting: *Deleted

## 2013-06-15 ENCOUNTER — Encounter: Payer: Self-pay | Admitting: *Deleted

## 2013-06-15 DIAGNOSIS — I4892 Unspecified atrial flutter: Secondary | ICD-10-CM

## 2013-06-15 NOTE — Telephone Encounter (Signed)
I spoke with the patient's daughter, Lanice Schwab. The patient is scheduled for her a-flutter ablation with Dr. Johney Frame on 06/29/13 at 7:30 am. Verbal instructions given to the patient's daughter. Letter of instructions mailed to the patient. Patient scheduled for labs and an EKG on 06/26/13.

## 2013-06-19 ENCOUNTER — Encounter (HOSPITAL_COMMUNITY): Payer: Self-pay | Admitting: Pharmacy Technician

## 2013-06-22 ENCOUNTER — Encounter: Payer: Self-pay | Admitting: Internal Medicine

## 2013-06-26 ENCOUNTER — Other Ambulatory Visit (INDEPENDENT_AMBULATORY_CARE_PROVIDER_SITE_OTHER): Payer: PRIVATE HEALTH INSURANCE

## 2013-06-26 ENCOUNTER — Ambulatory Visit (INDEPENDENT_AMBULATORY_CARE_PROVIDER_SITE_OTHER): Payer: PRIVATE HEALTH INSURANCE | Admitting: *Deleted

## 2013-06-26 DIAGNOSIS — I4892 Unspecified atrial flutter: Secondary | ICD-10-CM

## 2013-06-26 LAB — CBC WITH DIFFERENTIAL/PLATELET
Basophils Absolute: 0 10*3/uL (ref 0.0–0.1)
Basophils Relative: 0.5 % (ref 0.0–3.0)
Eosinophils Absolute: 0.1 10*3/uL (ref 0.0–0.7)
Eosinophils Relative: 1.6 % (ref 0.0–5.0)
HCT: 41.6 % (ref 36.0–46.0)
Hemoglobin: 13.9 g/dL (ref 12.0–15.0)
Lymphocytes Relative: 17.8 % (ref 12.0–46.0)
Lymphs Abs: 1.6 10*3/uL (ref 0.7–4.0)
MCHC: 33.4 g/dL (ref 30.0–36.0)
MCV: 97.2 fl (ref 78.0–100.0)
Monocytes Absolute: 0.7 10*3/uL (ref 0.1–1.0)
Monocytes Relative: 8.3 % (ref 3.0–12.0)
Neutro Abs: 6.3 10*3/uL (ref 1.4–7.7)
Neutrophils Relative %: 71.8 % (ref 43.0–77.0)
Platelets: 194 10*3/uL (ref 150.0–400.0)
RBC: 4.28 Mil/uL (ref 3.87–5.11)
RDW: 14.5 % (ref 11.5–14.6)
WBC: 8.8 10*3/uL (ref 4.5–10.5)

## 2013-06-26 LAB — BASIC METABOLIC PANEL
BUN: 19 mg/dL (ref 6–23)
CO2: 30 mEq/L (ref 19–32)
Calcium: 9.7 mg/dL (ref 8.4–10.5)
Chloride: 104 mEq/L (ref 96–112)
Creatinine, Ser: 1.2 mg/dL (ref 0.4–1.2)
GFR: 47.07 mL/min — ABNORMAL LOW (ref >60.00)
Glucose, Bld: 103 mg/dL — ABNORMAL HIGH (ref 70–99)
Potassium: 4.5 mEq/L (ref 3.5–5.1)
Sodium: 139 mEq/L (ref 135–145)

## 2013-06-26 LAB — PACEMAKER DEVICE OBSERVATION
AL IMPEDENCE PM: 474 Ohm
ATRIAL PACING PM: 0
BATTERY VOLTAGE: 2.72 V
RV LEAD IMPEDENCE PM: 625 Ohm
VENTRICULAR PACING PM: 99

## 2013-06-26 LAB — PROTIME-INR
INR: 1.6 ratio — ABNORMAL HIGH (ref 0.8–1.0)
Prothrombin Time: 17.2 s — ABNORMAL HIGH (ref 10.2–12.4)

## 2013-06-26 NOTE — Patient Instructions (Signed)
ekg completed, Per Dr Graciela Husbands pt to have pacer mode changed to DDI, pt taken to pacer room/ Renae Fickle will change mode per Dr Odessa Fleming orders.

## 2013-06-29 ENCOUNTER — Encounter (HOSPITAL_COMMUNITY)
Admission: RE | Disposition: A | Discharge status: Home / Self Care | Payer: Self-pay | Source: Ambulatory Visit | Attending: Internal Medicine

## 2013-06-29 ENCOUNTER — Ambulatory Visit (HOSPITAL_COMMUNITY): Payer: PRIVATE HEALTH INSURANCE | Admitting: Anesthesiology

## 2013-06-29 ENCOUNTER — Encounter (HOSPITAL_COMMUNITY): Payer: Self-pay | Admitting: Anesthesiology

## 2013-06-29 ENCOUNTER — Ambulatory Visit (HOSPITAL_COMMUNITY)
Admission: RE | Admit: 2013-06-29 | Discharge: 2013-06-30 | Disposition: A | Discharge status: Home / Self Care | Payer: PRIVATE HEALTH INSURANCE | Source: Ambulatory Visit | Attending: Internal Medicine | Admitting: Internal Medicine

## 2013-06-29 DIAGNOSIS — I442 Atrioventricular block, complete: Secondary | ICD-10-CM

## 2013-06-29 DIAGNOSIS — Z950 Presence of cardiac pacemaker: Secondary | ICD-10-CM | POA: Diagnosis present

## 2013-06-29 DIAGNOSIS — I509 Heart failure, unspecified: Secondary | ICD-10-CM | POA: Insufficient documentation

## 2013-06-29 DIAGNOSIS — I10 Essential (primary) hypertension: Secondary | ICD-10-CM | POA: Insufficient documentation

## 2013-06-29 DIAGNOSIS — I4892 Unspecified atrial flutter: Secondary | ICD-10-CM

## 2013-06-29 DIAGNOSIS — Z7901 Long term (current) use of anticoagulants: Secondary | ICD-10-CM | POA: Insufficient documentation

## 2013-06-29 DIAGNOSIS — I379 Nonrheumatic pulmonary valve disorder, unspecified: Secondary | ICD-10-CM | POA: Insufficient documentation

## 2013-06-29 DIAGNOSIS — Z79899 Other long term (current) drug therapy: Secondary | ICD-10-CM | POA: Insufficient documentation

## 2013-06-29 DIAGNOSIS — I080 Rheumatic disorders of both mitral and aortic valves: Secondary | ICD-10-CM | POA: Insufficient documentation

## 2013-06-29 HISTORY — PX: ABLATION: SHX5711

## 2013-06-29 HISTORY — PX: ATRIAL FLUTTER ABLATION: SHX5733

## 2013-06-29 HISTORY — DX: Atrioventricular block, complete: I44.2

## 2013-06-29 SURGERY — ATRIAL FLUTTER ABLATION
Anesthesia: Monitor Anesthesia Care

## 2013-06-29 MED ORDER — SODIUM CHLORIDE 0.9 % IJ SOLN: 3.0000 mL | Freq: Two times a day (BID) | INTRAMUSCULAR | Status: DC

## 2013-06-29 MED ORDER — ONDANSETRON HCL 4 MG/2ML IJ SOLN
INTRAMUSCULAR | Status: DC | PRN
  Administered 2013-06-29: 4 mg via INTRAVENOUS

## 2013-06-29 MED ORDER — FUROSEMIDE 40 MG PO TABS
40.0000 mg | ORAL_TABLET | Freq: Two times a day (BID) | ORAL | Status: DC
  Filled 2013-06-29 (×4): qty 1

## 2013-06-29 MED ORDER — ARTIFICIAL TEARS OP OINT
TOPICAL_OINTMENT | OPHTHALMIC | Status: DC | PRN
  Administered 2013-06-29: 1 via OPHTHALMIC

## 2013-06-29 MED ORDER — PHENYLEPHRINE HCL 10 MG/ML IJ SOLN
INTRAMUSCULAR | Status: DC | PRN
  Administered 2013-06-29: 40 ug via INTRAVENOUS

## 2013-06-29 MED ORDER — SODIUM CHLORIDE 0.9 % IJ SOLN: 3.0000 mL | INTRAMUSCULAR | Status: DC | PRN

## 2013-06-29 MED ORDER — MIDAZOLAM HCL 5 MG/5ML IJ SOLN
INTRAMUSCULAR | Status: DC | PRN
  Administered 2013-06-29: 1 mg via INTRAVENOUS

## 2013-06-29 MED ORDER — SODIUM CHLORIDE 0.9 % IV SOLN
INTRAVENOUS | Status: DC
  Administered 2013-06-29: 1000 mL via INTRAVENOUS

## 2013-06-29 MED ORDER — POTASSIUM CHLORIDE CRYS ER 20 MEQ PO TBCR
20.0000 meq | EXTENDED_RELEASE_TABLET | Freq: Every day | ORAL | Status: DC
  Filled 2013-06-29: qty 1

## 2013-06-29 MED ORDER — PILOCARPINE HCL 1 % OP SOLN
1.0000 [drp] | Freq: Four times a day (QID) | OPHTHALMIC | Status: DC
  Administered 2013-06-29 (×3): 1 [drp] via OPHTHALMIC
  Filled 2013-06-29: qty 15

## 2013-06-29 MED ORDER — CLONAZEPAM 0.5 MG PO TABS
0.5000 mg | ORAL_TABLET | Freq: Every day | ORAL | Status: DC
  Administered 2013-06-29: 23:00:00 0.5 mg via ORAL
  Filled 2013-06-29: qty 1

## 2013-06-29 MED ORDER — FENTANYL CITRATE 0.05 MG/ML IJ SOLN
INTRAMUSCULAR | Status: DC | PRN
  Administered 2013-06-29: 50 ug via INTRAVENOUS
  Administered 2013-06-29: 25 ug via INTRAVENOUS
  Administered 2013-06-29: 50 ug via INTRAVENOUS
  Administered 2013-06-29: 25 ug via INTRAVENOUS

## 2013-06-29 MED ORDER — VENLAFAXINE HCL ER 75 MG PO CP24
75.0000 mg | ORAL_CAPSULE | Freq: Every day | ORAL | Status: DC
  Administered 2013-06-29: 75 mg via ORAL
  Filled 2013-06-29 (×3): qty 1

## 2013-06-29 MED ORDER — LOSARTAN POTASSIUM 50 MG PO TABS
50.0000 mg | ORAL_TABLET | Freq: Every day | ORAL | Status: DC
  Administered 2013-06-29: 50 mg via ORAL
  Filled 2013-06-29 (×2): qty 1

## 2013-06-29 MED ORDER — ACETAMINOPHEN 325 MG PO TABS: 650.0000 mg | ORAL_TABLET | ORAL | Status: DC | PRN

## 2013-06-29 MED ORDER — RIVAROXABAN 20 MG PO TABS
20.0000 mg | ORAL_TABLET | Freq: Every day | ORAL | Status: DC
  Administered 2013-06-29: 20 mg via ORAL
  Filled 2013-06-29 (×2): qty 1

## 2013-06-29 MED ORDER — DORZOLAMIDE HCL-TIMOLOL MAL 2-0.5 % OP SOLN
2.0000 [drp] | Freq: Two times a day (BID) | OPHTHALMIC | Status: DC
  Filled 2013-06-29: qty 10

## 2013-06-29 MED ORDER — SODIUM CHLORIDE 0.9 % IV SOLN
INTRAVENOUS | Status: DC | PRN
  Administered 2013-06-29: 07:00:00 via INTRAVENOUS

## 2013-06-29 MED ORDER — ONDANSETRON HCL 4 MG/2ML IJ SOLN: 4.0000 mg | Freq: Four times a day (QID) | INTRAMUSCULAR | Status: DC | PRN

## 2013-06-29 MED ORDER — VENLAFAXINE HCL 75 MG PO TABS: 75.0000 mg | ORAL_TABLET | Freq: Every day | ORAL | Status: DC

## 2013-06-29 MED ORDER — LIDOCAINE HCL (CARDIAC) 20 MG/ML IV SOLN
INTRAVENOUS | Status: DC | PRN
  Administered 2013-06-29: 50 mg via INTRAVENOUS

## 2013-06-29 MED ORDER — LATANOPROST 0.005 % OP SOLN
1.0000 [drp] | Freq: Every day | OPHTHALMIC | Status: DC
  Filled 2013-06-29: qty 2.5

## 2013-06-29 MED ORDER — PROPOFOL 10 MG/ML IV BOLUS
INTRAVENOUS | Status: DC | PRN
  Administered 2013-06-29: 150 mg via INTRAVENOUS

## 2013-06-29 MED ORDER — SODIUM CHLORIDE 0.9 % IV SOLN: 250.0000 mL | INTRAVENOUS | Status: DC | PRN

## 2013-06-29 NOTE — Anesthesia Postprocedure Evaluation (Signed)
  Anesthesia Post-op Note  Patient: [name] Baird  Procedure(s) Performed: Procedure(s): ATRIAL FLUTTER ABLATION (N/A)  Patient Location: PACU and Cath Lab  Anesthesia Type:General  Level of Consciousness: awake, alert  and oriented  Airway and Oxygen Therapy: Patient Spontanous Breathing and Patient connected to nasal cannula oxygen  Post-op Pain: mild  Post-op Assessment: Post-op Vital signs reviewed, Patient's Cardiovascular Status Stable, Respiratory Function Stable, Patent Airway and Pain level controlled  Post-op Vital Signs: stable  Complications: No apparent anesthesia complications

## 2013-06-29 NOTE — Anesthesia Procedure Notes (Signed)
Procedure Name: LMA Insertion Date/Time: 06/29/2013 7:47 AM Performed by: Luster Landsberg Pre-anesthesia Checklist: Patient identified, Emergency Drugs available, Suction available and Patient being monitored Patient Re-evaluated:Patient Re-evaluated prior to inductionOxygen Delivery Method: Circle system utilized Preoxygenation: Pre-oxygenation with 100% oxygen Intubation Type: IV induction Ventilation: Mask ventilation without difficulty LMA: LMA inserted LMA Size: 4.0 Number of attempts: 1 Placement Confirmation: positive ETCO2 and breath sounds checked- equal and bilateral Tube secured with: Tape Dental Injury: Teeth and Oropharynx as per pre-operative assessment

## 2013-06-29 NOTE — Transfer of Care (Signed)
Immediate Anesthesia Transfer of Care Note  Patient: [name] Baird  Procedure(s) Performed: Procedure(s): ATRIAL FLUTTER ABLATION (N/A)  Patient Location: PACU  Anesthesia Type:General  Level of Consciousness: responds to stimulation  Airway & Oxygen Therapy: Patient Spontanous Breathing and Patient connected to nasal cannula oxygen  Post-op Assessment: Report given to PACU RN and Post -op Vital signs reviewed and stable  Post vital signs: Reviewed and stable  Complications: No apparent anesthesia complications

## 2013-06-29 NOTE — Progress Notes (Signed)
Utilization Review Completed.   Carlo Guevarra, RN, BSN Nurse Case Manager  [phone]  

## 2013-06-29 NOTE — Interval H&P Note (Signed)
History and Physical Interval Note:  Pt seen examined and Dr Odessa Fleming note is reviewed.  The patient has typical appearing atrial flutter but has a history of valvular heart disease with prior atriotomy.  She is clearly symptomatic with her atrial flutter.  I would agree with Dr Graciela Husbands that atrial flutter ablation is prudent at this time.  Therapeutic strategies for atrial flutter including medicine and ablation were discussed in detail with the patient today. Risk, benefits, and alternatives to EP study and radiofrequency ablation were also discussed in detail today. These risks include but are not limited to stroke, bleeding, vascular damage, tamponade, perforation, damage to the heart and other structures, pacemaker lead dislodgement, worsening renal function, and death. The patient understands these risk and wishes to proceed.  We will therefore proceed with catheter ablation at the next available time.    06/29/2013 7:52 AM  [name] Baird  has presented today for surgery, with the diagnosis of Aflutter  The various methods of treatment have been discussed with the patient and family. After consideration of risks, benefits and other options for treatment, the patient has consented to  Procedure(s): ATRIAL FLUTTER ABLATION (N/A) as a surgical intervention .  The patient's history has been reviewed, patient examined, no change in status, stable for surgery.  I have reviewed the patient's chart and labs.  Questions were answered to the patient's satisfaction.     Hillis Range

## 2013-06-29 NOTE — Brief Op Note (Signed)
06/29/2013  10:12 AM  Counter clockwise isthmus dependant right atrial flutter ablated along the cavotricuspid isthmus  See dication

## 2013-06-29 NOTE — Op Note (Signed)
[name], Baird                 ACCOUNT NO.:  [id number]  MEDICAL RECORD NO.:  [id number]  LOCATION:  6C07C                        FACILITY:  [facility]  PHYSICIAN:  Hillis Range, MD       DATE OF BIRTH:  1933-11-17  DATE OF PROCEDURE: DATE OF DISCHARGE:                              OPERATIVE REPORT   PREPROCEDURE DIAGNOSES: 1. Atrial flutter. 2. Complete heart block status post prior pacemaker implantation.  POSTPROCEDURE DIAGNOSES: 1. Counterclockwise isthmus-dependent right atrial flutter. 2. Complete heart block status post prior pacemaker implantation.  PROCEDURES: 1. Comprehensive EP study. 2. Three-dimensional mapping and ablation of SVT. 3. Radiofrequency ablation of SVT. 4. Pacemaker interrogation and reprogramming.  INTRODUCTION:  [name] Baird is a pleasant 79 year old female with a history of prior atriotomy following an ASD repair and pulmonary valve procedure.  She has a history of complete heart block and is status post prior pacemaker implantation.  She is pacemaker dependent chronically. She recently has developed atrial flutter.  She has taken off Multaq without improvement.  She presents today for EP study and radiofrequency ablation.  DESCRIPTION OF PROCEDURE:  Informed written consent was obtained and the patient was brought to the Electrophysiology Lab in the fasting state. She was adequately sedated with intravenous medications as outlined in the anesthesia report.  The patient's right groin was prepped and draped in the usual sterile fashion by the EP lab staff.  Using a percutaneous Seldinger technique, one 6, one 7, and one 8-French hemostasis sheaths were placed in the right common femoral vein.  A 7-French Biosense Webster decapolar catheter was introduced through the right common femoral vein and advanced into the coronary sinus for recording and pacing from this location.  A 6-French quadripolar Josephson catheter was introduced through the right  common femoral vein and advanced into the right ventricle for recording and pacing.  This catheter was pulled back to the His bundle location.  The patient presented to the Electrophysiology Lab in typical appearing atrial flutter.  The atrial flutter cycle length measured 270 milliseconds.  The coronary sinus activation was proximal to distal and suggestive of right atrial flutter.  Infrahisian block was observed.  I elected to perform three- dimensional mapping of atrial flutter.  A 7-French Biosense Webster 8-mm ablation catheter was introduced through the right common femoral vein and advanced into the right atrium.  Three-dimensional electroanatomical mapping was performed using the [employer].  This demonstrated a counter clockwise reentrant flutter around the tricuspid valve anulus.  The entire tachycardia cycle length was mapped from the right atrium.  Entrainment was performed from the left atrium, which revealed a long post pacing interval when compared to the tachycardia cycle length.  Entrainment was then performed from the cavotricuspid isthmus, which revealed a post pacing interval equal to the tachycardia cycle length.  The patient was therefore felt to have counterclockwise isthmus-dependent right atrial flutter.  I therefore elected to perform cavotricuspid isthmus ablation.  A series of radiofrequency ablations were performed along the usual cavotricuspid isthmus.  Extensive ablation was performed.  The ablation catheter was then advanced through a ramp sheath.  Additional ablation was performed along the isthmus. The tachycardia cycle  length extended and then the patient converted to a 2nd atrial flutter which was of a different atrial activation along the coronary sinus catheter.  I elected to perform additional three- dimensional mapping.  The catheter was placed into the right atrium, along the posterior wall and the tachycardia immediately terminated. The  tachycardia could unfortunately not be reinduced with multiple attempts of rapid atrial pacing today.  Following ablation, complete bidirectional cavotricuspid isthmus block was achieved as evident by a stimulus earliest atrial activation recorded bidirectional across the isthmus of 190 milliseconds with differential atrial pacing from the low lateral right atrium.  The patient was observed without return of conduction through the isthmus.  Following ablation, the AH interval measured 171 milliseconds.  Infrahisian block was observed with complete heart block and ventricular pacing.  VA dissociation was observed when pacing at 600 milliseconds.  Rapid atrial pacing was performed down to a cycle length of 250 milliseconds with no arrhythmias observed.  The procedure was therefore considered completed.  All catheters were removed and the sheaths were aspirated and flushed.  The sheaths were removed and hemostasis was assured.  The patient's pacemaker was interrogated.  Atrial and ventricular lead pacing sensing and impedance parameters were confirmed to be unchanged when compared to prior to the procedure today.  These results were available on the paper chart. There were no early apparent complications.  CONCLUSIONS: 1. Counterclockwise isthmus-dependent right atrial flutter upon     presentation, successfully ablated along the usual cavotricuspid     isthmus with complete isthmus block achieved. 2. A 2nd atrial flutter was observed from the right atrial which     spontaneously terminated and could not be mapped as it could not be     reinduced today. 3. No inducible arrhythmias following ablation. 4. No early apparent complications.     Hillis Range, MD     JA/MEDQ  D:  06/29/2013  T:  06/29/2013  Job:  [id number]  cc:   Duke Salvia, MD, [facility]

## 2013-06-29 NOTE — Anesthesia Preprocedure Evaluation (Addendum)
Anesthesia Evaluation  Patient identified by MRN, date of birth, ID band Patient awake    Reviewed: Allergy & Precautions, H&P , NPO status , Patient's Chart, lab work & pertinent test results  Airway Mallampati: II TM Distance: >3 FB Neck ROM: Full    Dental  (+) Caps, Dental Advisory Given and Teeth Intact   Pulmonary  breath sounds clear to auscultation        Cardiovascular hypertension, Pt. on medications +CHF + dysrhythmias Atrial Fibrillation + pacemaker Rhythm:Regular     Neuro/Psych PSYCHIATRIC DISORDERS Anxiety Depression    GI/Hepatic   Endo/Other    Renal/GU      Musculoskeletal   Abdominal   Peds  Hematology   Anesthesia Other Findings   Reproductive/Obstetrics                          Anesthesia Physical Anesthesia Plan  ASA: III  Anesthesia Plan: General   Post-op Pain Management:    Induction: Intravenous  Airway Management Planned: LMA  Additional Equipment:   Intra-op Plan:   Post-operative Plan: Extubation in OR  Informed Consent: I have reviewed the patients History and Physical, chart, labs and discussed the procedure including the risks, benefits and alternatives for the proposed anesthesia with the patient or authorized representative who has indicated his/her understanding and acceptance.   Dental advisory given  Plan Discussed with: CRNA and Anesthesiologist  Anesthesia Plan Comments: (A. Flutter Pacemaker 2007 S/P ASD closure, pulmonary valvotomy  1985 Htn  Plan GA with LMA  Kipp Brood MD )       Anesthesia Quick Evaluation

## 2013-06-29 NOTE — H&P (View-Only) (Signed)
ELECTROPHYSIOLOGY CONSULT NOTE  Patient ID: [name] Baird, MRN: [id number], DOB/AGE: April 18, 1933 79 y.o. Admit date: (Not on file) Date of Consult: 06/08/2013  Primary Physician: [name] Bamberger, NP Primary Cardiologist:  [facility] Chief Complaint: afLUTTER   HPI [name] Baird is a 79 y.o. female   with a history of a prior pacemaker implantation referred for consideration of ablation of atrial flutter.  She has a complex cardiac history; she has a history of pulmonic stenosis with a prior pulmonary valvotomy in 1985.she also at that time had repair of an ASD. She also has mild aortic insufficiency pulmonic insufficiency and mitral insufficiency.  She was found to be in atrial flutter. She was started on Rivaroxaban an attempt to pace terminate failed. She underwent cardioversion April 2014  She reverted a few weeks later. It was her impression and her daughter's impression that she felt significantly better while in sinus rhythm.     Past Medical History  Diagnosis Date  . Hypertension   . Dysrhythmia     aflutter  . Pacemaker 2007    medtronic  . Depression   . Anxiety   . CHF (congestive heart failure)   . Shortness of breath   . Valvular heart disease   . Glaucoma       Surgical History:  Past Surgical History  Procedure Laterality Date  . Aortic valve repair    . Pulmonary valvotomy  1985  . Trigeminal sx      left side  . Cardioversion N/A 03/24/2013    Procedure: CARDIOVERSION;  Surgeon: [name] Fair, MD;  Location: [facility];  Service: Cardiovascular;  Laterality: N/A;  . Pacemaker insertion  2007    Medtronic Adapta ADDRL1, serial # [id number]     Home Meds: Prior to Admission medications   Medication Sig Start Date End Date Taking? Authorizing Provider  clonazePAM (KLONOPIN) 0.5 MG tablet Take 0.5 mg by mouth daily.    Historical Provider, MD  furosemide (LASIX) 40 MG tablet TAKE 1 TABLET DAILY AS DIRECTED 06/07/13   [name] Croitoru, MD  latanoprost (XALATAN)  0.005 % ophthalmic solution Place 1 drop into both eyes at bedtime.    Historical Provider, MD  losartan (COZAAR) 50 MG tablet Take 1 tablet (50 mg total) by mouth daily. 05/31/13   [name] Croitoru, MD  Multiple Vitamins-Minerals (MULTIVITAMIN PO) Take 1 tablet by mouth.    Historical Provider, MD  nebivolol (BYSTOLIC) 10 MG tablet Take 10 mg by mouth daily.    Historical Provider, MD  pilocarpine (PILOCAR) 1 % ophthalmic solution Place 1 drop into the left eye 4 (four) times daily.    Historical Provider, MD  potassium chloride (K-DUR,KLOR-CON) 10 MEQ tablet Take 10 mEq by mouth 3 (three) times daily.    Historical Provider, MD  rivaroxaban (XARELTO) 10 MG TABS tablet Take 20 mg by mouth daily.    Historical Provider, MD  rosuvastatin (CRESTOR) 10 MG tablet Take 10 mg by mouth at bedtime.    Historical Provider, MD  timolol (TIMOPTIC) 0.5 % ophthalmic solution Place 1 drop into both eyes 2 (two) times daily.    Historical Provider, MD  venlafaxine (EFFEXOR) 75 MG tablet Take 75 mg by mouth 2 (two) times daily.    Historical Provider, MD    *  Allergies:  Allergies  Allergen Reactions  . Sulfa Antibiotics Other (See Comments)    welps   . Hydrocodone     nausea    History   Social History  . Marital  Status: Widowed    Spouse Name: N/A    Number of Children: 2  . Years of Education: N/A   Occupational History  . Not on file.   Social History Main Topics  . Smoking status: Never Smoker   . Smokeless tobacco: Not on file  . Alcohol Use: No  . Drug Use: No  . Sexually Active: No   Other Topics Concern  . Not on file   Social History Narrative  . No narrative on file     No family history on file.   ROS:  Please see the history of present illness.     All other systems reviewed and negative.    Physical Exam:   BP 110/70  Pulse 72  Ht 5\' 4"  (1.626 m)  Wt 151 lb (68.493 kg)  BMI 25.91 kg/m2  General: Well developed, well nourished female in no acute  distress. Head: Normocephalic, atraumatic, sclera non-icteric, no xanthomas, nares are without discharge. EENT: normal Lymph Nodes:  none Back: without scoliosis/kyphosis, no CVA tendersness Neck: Negative for carotid bruits. JVD not elevated. Lungs: Clear bilaterally to auscultation without wheezes, rales, or rhonchi. Breathing is unlabored. Heart: RRR with S1 S2. 2/6  murmur , rubs, or gallops appreciated. Abdomen: Soft, non-tender, non-distended with normoactive bowel sounds. No hepatomegaly. No rebound/guarding. No obvious abdominal masses. Msk:  Strength and tone appear normal for age. Extremities: No clubbing or cyanosis. No  edema.  Distal pedal pulses are 2+ and equal bilaterally. Skin: Warm and Dry Neuro: Alert and oriented X 3. CN III-XII intact Grossly normal sensory and motor function . Psych:  Responds to questions appropriately with a normal affect.      Labs: Cardiac Enzymes No results found for this basename: CKTOTAL, CKMB, TROPONINI,  in the last 72 hours CBC Lab Results  Component Value Date   WBC 8.8 07/13/2010   HGB 9.6* 07/13/2010   HCT 27.6* 07/13/2010   MCV 96.9 07/13/2010   PLT 243 07/13/2010    Radiology/Studies:  No results found.  EKG:  Atrial flutter with typical wave form   Complete heart block  V pacing  Assessment and Plan:    [name] Baird

## 2013-06-30 ENCOUNTER — Encounter (HOSPITAL_COMMUNITY): Payer: Self-pay | Admitting: *Deleted

## 2013-06-30 DIAGNOSIS — I4892 Unspecified atrial flutter: Secondary | ICD-10-CM

## 2013-06-30 DIAGNOSIS — Z950 Presence of cardiac pacemaker: Secondary | ICD-10-CM

## 2013-06-30 DIAGNOSIS — I442 Atrioventricular block, complete: Secondary | ICD-10-CM

## 2013-06-30 MED ORDER — OFF THE BEAT BOOK
Freq: Once | Status: DC
  Filled 2013-06-30: qty 1

## 2013-06-30 NOTE — Discharge Summary (Signed)
Physician Discharge Summary      Patient ID: [name] Baird MRN: [id number] DOB/AGE: [birth year] 79 y.o.  Admit date: 06/29/2013 Discharge date: 06/30/2013  Primary Discharge Diagnosis  Atrial flutter Secondary Discharge Diagnosis  Complete heart block (chronic) s/p PPM, diastolic dysfunction  Significant Diagnostic Studies: atrial flutter ablation  Consults: None  Hospital Course:   The patient was admitted for elective atrial flutter ablation.  She presented in counter clockwise isthmus dependant right atrial flutter.  This was successfully ablated.  She had a second right atria flutter which terminated before mapping could be performed and could not be reinduced.  She did well overnight.  She was observed to have frequent nonsustained atrial arrhythmias overnight for which she was asymptomatic.  At time of discharge she was alert, ambulatory, and without complaint.     Discharge Exam: Blood pressure 115/49, pulse 76, temperature 99.2 F (37.3 C), temperature source Oral, resp. rate 16, height 5\' 4"  (1.626 m), weight 156 lb 12 oz (71.1 kg), SpO2 93.00%.   Physical Exam: Filed Vitals:   06/29/13 1600 06/29/13 2059 06/30/13 0018 06/30/13 0529  BP: 118/52 142/64 134/55 115/49  Pulse: 69 78 83 76  Temp: 97.7 F (36.5 C) 98.8 F (37.1 C) 99.4 F (37.4 C) 99.2 F (37.3 C)  TempSrc: Oral Axillary Oral Oral  Resp: 18 18 20 16   Height:      Weight:   156 lb 12 oz (71.1 kg)   SpO2:  95%  93%    GEN- The patient is well appearing, alert and oriented x 3 today.   Head- normocephalic, atraumatic Eyes-  Sclera clear, conjunctiva pink Ears- hearing intact Oropharynx- clear Neck- supple, no JVP Lymph- no cervical lymphadenopathy Lungs- Clear to ausculation bilaterally, normal work of breathing Heart- Regular rate and rhythm, no murmurs, rubs or gallops, PMI not laterally displaced GI- soft, NT, ND, + BS Extremities- no clubbing, cyanosis, or edema, no hematoma/ bruits   Labs:   Lab Results  Component Value Date   WBC 8.8 06/26/2013   HGB 13.9 06/26/2013   HCT 41.6 06/26/2013   MCV 97.2 06/26/2013   PLT 194.0 06/26/2013    Recent Labs Lab 06/26/13 0958  NA 139  K 4.5  CL 104  CO2 30  BUN 19  CREATININE 1.2  CALCIUM 9.7  GLUCOSE 103*   No results found for this basename: CKTOTAL, CKMB, CKMBINDEX, TROPONINI    No results found for this basename: CHOL   No results found for this basename: HDL   No results found for this basename: LDLCALC   No results found for this basename: TRIG   No results found for this basename: CHOLHDL   No results found for this basename: LDLDIRECT        FOLLOW UP PLANS AND APPOINTMENTS  Future Appointments Provider Department Dept Phone   08/03/2013 10:45 AM Hillis Range, MD [facility] Main Office [place]) [phone]   08/08/2013 6:00 AM [facility] [employer] [place] [phone]       Medication List         calcium-vitamin D 250-125 MG-UNIT per tablet  Commonly known as:  OSCAL  Take 1 tablet by mouth daily.     Cholecalciferol 1000 UNITS capsule  Take 1,000 Units by mouth 2 (two) times daily.     clonazePAM 0.5 MG tablet  Commonly known as:  KLONOPIN  Take 0.5 mg by mouth daily as needed.     dorzolamide-timolol 22.3-6.8 MG/ML ophthalmic solution  Commonly known as:  COSOPT  Place 2 drops into both eyes 2 (two) times daily.     dronedarone 400 MG tablet  Commonly known as:  MULTAQ  Take 400 mg by mouth 2 (two) times daily with a meal.     furosemide 40 MG tablet  Commonly known as:  LASIX  Take 40 mg by mouth 2 (two) times daily.     latanoprost 0.005 % ophthalmic solution  Commonly known as:  XALATAN  Place 1 drop into both eyes at bedtime.     losartan 50 MG tablet  Commonly known as:  COZAAR  Take 50 mg by mouth daily.     MULTIVITAMIN PO  Take 1 tablet by mouth daily.     nebivolol 10 MG tablet  Commonly known as:  BYSTOLIC  Take 10  mg by mouth daily.     pilocarpine 1 % ophthalmic solution  Commonly known as:  PILOCAR  Place 4 drops into the left eye daily.     potassium chloride 10 MEQ tablet  Commonly known as:  K-DUR,KLOR-CON  Take 20 mEq by mouth daily.     rivaroxaban 10 MG Tabs tablet  Commonly known as:  XARELTO  Take 20 mg by mouth daily.     rosuvastatin 10 MG tablet  Commonly known as:  CRESTOR  Take 10 mg by mouth at bedtime.     TYLENOL ARTHRITIS PAIN 650 MG CR tablet  Generic drug:  acetaminophen  Take 1,300 mg by mouth daily as needed for pain.     venlafaxine 75 MG tablet  Commonly known as:  EFFEXOR  Take 75 mg by mouth daily.         BRING ALL MEDICATIONS WITH YOU TO FOLLOW UP APPOINTMENTS  Time spent with patient to include physician time: 30 minutes Signed: Hillis Range, MD 06/30/2013, 7:20 AM

## 2013-07-25 ENCOUNTER — Other Ambulatory Visit: Payer: Self-pay | Admitting: Cardiovascular Disease

## 2013-07-25 NOTE — Telephone Encounter (Signed)
Rx was sent to pharmacy electronically. 

## 2013-07-27 ENCOUNTER — Telehealth: Payer: Self-pay | Admitting: *Deleted

## 2013-07-27 ENCOUNTER — Telehealth: Payer: Self-pay | Admitting: Cardiovascular Disease

## 2013-07-27 NOTE — Telephone Encounter (Signed)
Returned call to Williams, pt's daughter.  Stated they received a letter in the mail from pt's pharmacy that Dr. Salena Saner needs to verify some information for pt's Xarelto by the 15th (Aug) or pt will have to pay full price.  Stated pt is due for a refill soon and she wanted to make sure this was taken care of in time.  Stated she faxed the information to the Attn: Barbara/Dr. C about a week ago and hasn't heard anything back.  Daughter informed Britta Mccreedy, CMA will be notified.  Daughter verbalized understanding and will refax letter.  Asks that Marietta call her back today to let her know it was received.  Forwarded to B. Lassiter, CMA.

## 2013-07-27 NOTE — Telephone Encounter (Signed)
[name] notified her mothers Xarelto is approved x one year w/[employer]. Voiced understanding.

## 2013-07-27 NOTE — Telephone Encounter (Signed)
Please call,if she is not in her office dial [phone] and have the operator page her. Still waiting to hear from you concerning her mother Xarelto.

## 2013-07-27 NOTE — Telephone Encounter (Signed)
[name] notified her mother's xarelto has been approved x one year.

## 2013-07-27 NOTE — Telephone Encounter (Signed)
Notified xarelto has been approved for one year.

## 2013-08-03 ENCOUNTER — Ambulatory Visit (INDEPENDENT_AMBULATORY_CARE_PROVIDER_SITE_OTHER): Payer: PRIVATE HEALTH INSURANCE | Admitting: Internal Medicine

## 2013-08-03 ENCOUNTER — Encounter: Payer: Self-pay | Admitting: Internal Medicine

## 2013-08-03 VITALS — BP 129/76 | HR 88 | Ht 64.0 in | Wt 150.6 lb

## 2013-08-03 DIAGNOSIS — Z950 Presence of cardiac pacemaker: Secondary | ICD-10-CM

## 2013-08-03 DIAGNOSIS — I442 Atrioventricular block, complete: Secondary | ICD-10-CM

## 2013-08-03 DIAGNOSIS — I4891 Unspecified atrial fibrillation: Secondary | ICD-10-CM

## 2013-08-03 DIAGNOSIS — I4892 Unspecified atrial flutter: Secondary | ICD-10-CM

## 2013-08-03 DIAGNOSIS — I480 Paroxysmal atrial fibrillation: Secondary | ICD-10-CM | POA: Insufficient documentation

## 2013-08-03 LAB — PACEMAKER DEVICE OBSERVATION
AL IMPEDENCE PM: 500 Ohm
ATRIAL PACING PM: 83
BAMS-0001: 140 {beats}/min
BATTERY VOLTAGE: 2.71 V
RV LEAD IMPEDENCE PM: 727 Ohm
VENTRICULAR PACING PM: 100

## 2013-08-03 NOTE — Patient Instructions (Addendum)
Your physician recommends that you schedule a follow-up appointment in: 3 months with Dr. Croitoru.  

## 2013-08-03 NOTE — Progress Notes (Signed)
PCP: Tomi Bamberger, NP Primary Cardiologist:  Dr Donette Larry is a 79 y.o. female who presents today for routine electrophysiology followup.  Since her recent atrial flutter ablation, the patient reports doing very well.  She is very pleased with the results of her procedure.  She denies procedure related problems.  Her exercise tolerance is much improved.   Her fatigue is resolved.  Today, she denies symptoms of palpitations, chest pain, shortness of breath,  lower extremity edema, dizziness, presyncope, or syncope.  The patient is otherwise without complaint today.   Past Medical History  Diagnosis Date  . Hypertension   . Atrial flutter     s/p ablation 06/29/2013 by Dr Johney Frame  . Complete heart block 2007    s/p medtronic pacemaker implantation  . Depression   . Anxiety   . CHF (congestive heart failure)   . Glaucoma   . Status post patch closure of ASD   . Pulmonary stenosis sp valvotomy        . Shortness of breath    Past Surgical History  Procedure Laterality Date  . Aortic valve repair    . Pulmonary valvotomy  1985  . Trigeminal sx      left side  . Cardioversion N/A 03/24/2013    Procedure: CARDIOVERSION;  Surgeon: Thurmon Fair, MD;  Location: [facility];  Service: Cardiovascular;  Laterality: N/A;  . Pacemaker insertion  2007    Medtronic Adapta ADDRL1, serial # [id number]  . Ablation  06/29/2013    s/p isthmus ablation for atrial flutter by Dr Johney Frame    Current Outpatient Prescriptions  Medication Sig Dispense Refill  . acetaminophen (TYLENOL ARTHRITIS PAIN) 650 MG CR tablet Take 1,300 mg by mouth daily as needed for pain.       [name] Kitchen BYSTOLIC 10 MG tablet TAKE 1 TABLET DAILY  90 tablet  2  . calcium-vitamin D (OSCAL) 250-125 MG-UNIT per tablet Take 1 tablet by mouth daily.      . Cholecalciferol 1000 UNITS capsule Take 1,000 Units by mouth 2 (two) times daily.      . clonazePAM (KLONOPIN) 0.5 MG tablet Take 0.5 mg by mouth daily as needed.       .  dorzolamide-timolol (COSOPT) 22.3-6.8 MG/ML ophthalmic solution Place 2 drops into both eyes 2 (two) times daily.      . furosemide (LASIX) 40 MG tablet Take 40 mg by mouth daily.       [name] Kitchen latanoprost (XALATAN) 0.005 % ophthalmic solution Place 1 drop into both eyes at bedtime.      [name] Kitchen losartan (COZAAR) 50 MG tablet Take 50 mg by mouth daily.      . Multiple Vitamins-Minerals (MULTIVITAMIN PO) Take 1 tablet by mouth daily.       . pilocarpine (PILOCAR) 1 % ophthalmic solution Place 4 drops into the left eye daily.       . potassium chloride (K-DUR,KLOR-CON) 10 MEQ tablet Take 20 mEq by mouth daily.       . rivaroxaban (XARELTO) 10 MG TABS tablet Take 20 mg by mouth daily.      . rosuvastatin (CRESTOR) 10 MG tablet Take 10 mg by mouth at bedtime.      [name] Kitchen venlafaxine (EFFEXOR) 75 MG tablet Take 75 mg by mouth daily.        No current facility-administered medications for this visit.    Physical Exam: Filed Vitals:   08/03/13 1048  BP: 129/76  Pulse: 88  Height: 5\' 4"  (1.626 m)  Weight: 150 lb 9.6 oz (68.312 kg)    GEN- The patient is well appearing, alert and oriented x 3 today.   Head- normocephalic, atraumatic Eyes-  Sclera clear, conjunctiva pink Ears- hearing intact Oropharynx- clear Lungs- Clear to ausculation bilaterally, normal work of breathing Heart- Regular rate and rhythm, no murmurs, rubs or gallops, PMI not laterally displaced GI- soft, NT, ND, + BS Extremities- no clubbing, cyanosis, or edema  Pacemaker interrogation today is reviewed (see paceart)  Assessment and Plan:  1. Atrial flutter Resolved s/p ablation  2. afib She has had 1 hour of afib on 07/24/13 for which she was asymptomatic Continue xarelto long term  3. Complete heart block Normal pacemaker function See Pace Art report No changes today  Follow-up with Dr Royann Shivers I will see as needed going forward

## 2013-08-04 ENCOUNTER — Encounter: Payer: Self-pay | Admitting: Internal Medicine

## 2013-08-13 ENCOUNTER — Other Ambulatory Visit: Payer: Self-pay | Admitting: Cardiovascular Disease

## 2013-08-13 DIAGNOSIS — I4892 Unspecified atrial flutter: Secondary | ICD-10-CM

## 2013-08-13 DIAGNOSIS — I442 Atrioventricular block, complete: Secondary | ICD-10-CM

## 2013-08-13 LAB — PACEMAKER DEVICE OBSERVATION

## 2013-09-01 ENCOUNTER — Encounter: Payer: Self-pay | Admitting: *Deleted

## 2013-09-01 LAB — REMOTE PACEMAKER DEVICE
AL IMPEDENCE PM: 490 Ohm
AL THRESHOLD: 1.125 V
ATRIAL PACING PM: 97.8
BAMS-0001: 140 {beats}/min
BATTERY VOLTAGE: 2.73 V
RV LEAD IMPEDENCE PM: 746 Ohm
RV LEAD THRESHOLD: 0.875 V
VENTRICULAR PACING PM: 99.7

## 2013-09-08 ENCOUNTER — Telehealth: Payer: Self-pay | Admitting: *Deleted

## 2013-09-08 NOTE — Telephone Encounter (Signed)
Surgical clearance faxed  

## 2013-09-13 ENCOUNTER — Ambulatory Visit: Payer: Self-pay | Admitting: Ophthalmology

## 2013-09-13 DIAGNOSIS — I509 Heart failure, unspecified: Secondary | ICD-10-CM

## 2013-09-13 DIAGNOSIS — R011 Cardiac murmur, unspecified: Secondary | ICD-10-CM

## 2013-09-13 LAB — POTASSIUM: Potassium: 4.2 mmol/L (ref 3.5–5.1)

## 2013-09-14 ENCOUNTER — Telehealth: Payer: Self-pay | Admitting: Cardiovascular Disease

## 2013-09-14 NOTE — Telephone Encounter (Signed)
Need to know if you received fax yesterday about stopping her Xarelto.Need this asap.-scheduled for surgery on 09-20-13.

## 2013-09-14 NOTE — Telephone Encounter (Signed)
Message from Medical Records: "Chart was put on Dr. Renaye Rakers cart yesterday. ST" [facility] [place])

## 2013-09-14 NOTE — Telephone Encounter (Signed)
Confirmed chart is on Dr. Erin Hearing cart.

## 2013-09-20 ENCOUNTER — Ambulatory Visit: Payer: Self-pay | Admitting: Ophthalmology

## 2013-09-26 ENCOUNTER — Other Ambulatory Visit: Payer: Self-pay

## 2013-09-26 DIAGNOSIS — Z1231 Encounter for screening mammogram for malignant neoplasm of breast: Secondary | ICD-10-CM

## 2013-10-01 ENCOUNTER — Other Ambulatory Visit: Payer: Self-pay | Admitting: Cardiovascular Disease

## 2013-10-19 ENCOUNTER — Ambulatory Visit
Admission: RE | Admit: 2013-10-19 | Discharge: 2013-10-19 | Disposition: A | Discharge status: Home / Self Care | Payer: PRIVATE HEALTH INSURANCE | Source: Ambulatory Visit

## 2013-10-19 DIAGNOSIS — Z1231 Encounter for screening mammogram for malignant neoplasm of breast: Secondary | ICD-10-CM

## 2013-10-26 ENCOUNTER — Ambulatory Visit (INDEPENDENT_AMBULATORY_CARE_PROVIDER_SITE_OTHER): Payer: PRIVATE HEALTH INSURANCE | Admitting: Cardiovascular Disease

## 2013-10-26 ENCOUNTER — Encounter: Payer: Self-pay | Admitting: Cardiovascular Disease

## 2013-10-26 ENCOUNTER — Ambulatory Visit: Payer: PRIVATE HEALTH INSURANCE | Admitting: Cardiovascular Disease

## 2013-10-26 VITALS — BP 139/79 | HR 77 | Resp 16 | Ht 65.0 in | Wt 151.7 lb

## 2013-10-26 DIAGNOSIS — Z9889 Other specified postprocedural states: Secondary | ICD-10-CM

## 2013-10-26 DIAGNOSIS — Z8774 Personal history of (corrected) congenital malformations of heart and circulatory system: Secondary | ICD-10-CM

## 2013-10-26 DIAGNOSIS — I442 Atrioventricular block, complete: Secondary | ICD-10-CM

## 2013-10-26 DIAGNOSIS — I370 Nonrheumatic pulmonary valve stenosis: Secondary | ICD-10-CM

## 2013-10-26 DIAGNOSIS — I4892 Unspecified atrial flutter: Secondary | ICD-10-CM

## 2013-10-26 DIAGNOSIS — Z950 Presence of cardiac pacemaker: Secondary | ICD-10-CM

## 2013-10-26 DIAGNOSIS — I379 Nonrheumatic pulmonary valve disorder, unspecified: Secondary | ICD-10-CM

## 2013-10-26 DIAGNOSIS — I4891 Unspecified atrial fibrillation: Secondary | ICD-10-CM

## 2013-10-26 LAB — PACEMAKER DEVICE OBSERVATION

## 2013-10-26 NOTE — Patient Instructions (Signed)
Remote monitoring is used to monitor your pacemaker from home. This monitoring reduces the number of office visits required to check your device to one time per year. It allows us to keep an eye on the functioning of your device to ensure it is working properly. You are scheduled for a device check from home on 01-29-2014. You may send your transmission at any time that day. If you have a wireless device, the transmission will be sent automatically. After your physician reviews your transmission, you will receive a postcard with your next transmission date.   Your physician recommends that you schedule a follow-up appointment in: 6 months  

## 2013-11-02 ENCOUNTER — Encounter: Payer: Self-pay | Admitting: Cardiovascular Disease

## 2013-11-02 LAB — MDC_IDC_ENUM_SESS_TYPE_INCLINIC
Battery Impedance: 2045 Ohm
Battery Remaining Longevity: 21 mo
Battery Voltage: 2.71 V
Brady Statistic RA Percent Paced: 96.8 %
Brady Statistic RV Percent Paced: 99.6 %
Lead Channel Pacing Threshold Amplitude: 0.5 V
Lead Channel Pacing Threshold Amplitude: 0.75 V
Lead Channel Pacing Threshold Pulse Width: 0.4 ms
Lead Channel Pacing Threshold Pulse Width: 0.4 ms
Lead Channel Sensing Intrinsic Amplitude: 2.8 mV
Lead Channel Setting Pacing Amplitude: 2.25 V
Lead Channel Setting Pacing Amplitude: 2.5 V
Lead Channel Setting Pacing Pulse Width: 0.4 ms
Lead Channel Setting Sensing Sensitivity: 4 mV

## 2013-11-02 NOTE — Assessment & Plan Note (Signed)
Normal dual chamber permanent pacemaker functioning. Almost 100% atrial pacing and 100% ventricular pacing. No permanent device reprogramming performed.

## 2013-11-02 NOTE — Assessment & Plan Note (Signed)
Last echo April 2013 showed mild to moderate pulmonary valve regurgitation and no residual pulmonary valve stenosis normal right ventricular size with mildly reduced right ventricular systolic function

## 2013-11-02 NOTE — Assessment & Plan Note (Signed)
Mild to moderately dilated left atrium. No residual ASD by echo.

## 2013-11-02 NOTE — Assessment & Plan Note (Signed)
Pacemaker dependent. She is poorly tolerant of ventricular pacing threshold testing

## 2013-11-02 NOTE — Progress Notes (Signed)
Patient ID: [name] Baird, female   DOB: 03/25/1933, 80 y.o.   MRN: [id number]      Reason for office visit Pacemaker check/complete heart block, atrial flutter status post ablation, history of pulmonic stenosis status post valvotomy and atrial septal defect status post patch closure.  [name] Baird is feeling well. She had a flutter ablation performed by Dr. Johney Frame in July and her Multaq was stopped. At the time of the EP study she was noted to have conventional counterclockwise isthmus-dependent right atrial flutter but then a second type of right atrial flutter was noted tbriefly. It could not be reinduced for full study.  She has complete heart block and is pacemaker dependent. Since her last pacemaker interrogation there has been 97% right atrial pacing and 100% biventricular pacing. She has had one meaningful episode of atrial fibrillation lasting for over 4 days. She takes Xarelto and has not had any bleeding problems or any focal neurological events or other signs of embolism.   She denies shortness of breath chest pain or edema. She takes a small daily dose of loop diuretic. Her daughter gives her an extra dose for edema and weight gain but this occurs at the most once every 2-3 weeks.   Allergies  Allergen Reactions  . Sulfa Antibiotics Other (See Comments)    welps   . Hydrocodone     nausea    Current Outpatient Prescriptions  Medication Sig Dispense Refill  . acetaminophen (TYLENOL ARTHRITIS PAIN) 650 MG CR tablet Take 1,300 mg by mouth daily as needed for pain.       [name] Kitchen BYSTOLIC 10 MG tablet TAKE 1 TABLET DAILY  90 tablet  2  . calcium-vitamin D (OSCAL) 250-125 MG-UNIT per tablet Take 1 tablet by mouth daily.      . clonazePAM (KLONOPIN) 0.5 MG tablet Take 0.5 mg by mouth daily as needed.       . dorzolamide-timolol (COSOPT) 22.3-6.8 MG/ML ophthalmic solution Place 2 drops into both eyes 2 (two) times daily.      . furosemide (LASIX) 40 MG tablet Take 40 mg by mouth daily.       [name] Kitchen  latanoprost (XALATAN) 0.005 % ophthalmic solution Place 1 drop into both eyes at bedtime.      [name] Kitchen losartan (COZAAR) 50 MG tablet Take 50 mg by mouth daily.      . Multiple Vitamins-Minerals (MULTIVITAMIN PO) Take 1 tablet by mouth daily.       . pilocarpine (PILOCAR) 1 % ophthalmic solution Place 4 drops into the left eye daily.       . potassium chloride (K-DUR,KLOR-CON) 10 MEQ tablet Take 20 mEq by mouth daily.       . rosuvastatin (CRESTOR) 10 MG tablet Take 10 mg by mouth at bedtime.      [name] Kitchen venlafaxine (EFFEXOR) 75 MG tablet Take 75 mg by mouth daily.       [name] Baird 20 MG TABS tablet TAKE 1 TABLET EVERY DAY  30 tablet  5   No current facility-administered medications for this visit.    Past Medical History  Diagnosis Date  . Hypertension   . Atrial flutter     s/p ablation 06/29/2013 by Dr Johney Frame  . Complete heart block 2007    s/p medtronic pacemaker implantation  . Depression   . Anxiety   . CHF (congestive heart failure)   . Glaucoma   . Status post patch closure of ASD   . Pulmonary stenosis sp valvotomy        .  Shortness of breath     Past Surgical History  Procedure Laterality Date  . Aortic valve repair    . Pulmonary valvotomy  1985  . Trigeminal sx      left side  . Cardioversion N/A 03/24/2013    Procedure: CARDIOVERSION;  Surgeon: Thurmon Fair, MD;  Location: [facility];  Service: Cardiovascular;  Laterality: N/A;  . Pacemaker insertion  2007    Medtronic Adapta ADDRL1, serial # [id number]  . Ablation  06/29/2013    s/p isthmus ablation for atrial flutter by Dr Johney Frame    No family history on file.  History   Social History  . Marital Status: Widowed    Spouse Name: N/A    Number of Children: 2  . Years of Education: N/A   Occupational History  . Not on file.   Social History Main Topics  . Smoking status: Never Smoker   . Smokeless tobacco: Never Used  . Alcohol Use: No  . Drug Use: No  . Sexual Activity: No   Other Topics Concern  . Not  on file   Social History Narrative  . No narrative on file    Review of systems: The patient specifically denies any chest pain at rest or with exertion, dyspnea at rest or with exertion, orthopnea, paroxysmal nocturnal dyspnea, syncope, palpitations, focal neurological deficits, intermittent claudication, lower extremity edema, unexplained weight gain, cough, hemoptysis or wheezing.  The patient also denies abdominal pain, nausea, vomiting, dysphagia, diarrhea, constipation, polyuria, polydipsia, dysuria, hematuria, frequency, urgency, abnormal bleeding or bruising, fever, chills, unexpected weight changes, mood swings, change in skin or hair texture, change in voice quality, auditory or visual problems, allergic reactions or rashes, new musculoskeletal complaints other than usual "aches and pains".   PHYSICAL EXAM BP 139/79  Pulse 77  Resp 16  Ht 5\' 5"  (1.651 m)  Wt 151 lb 11.2 oz (68.811 kg)  BMI 25.24 kg/m2  General: Alert, oriented x3, no distress Head: no evidence of trauma, PERRL, EOMI, no exophtalmos or lid lag, no myxedema, no xanthelasma; normal ears, nose and oropharynx Neck: normal jugular venous pulsations and no hepatojugular reflux; brisk carotid pulses without delay and no carotid bruits Chest: clear to auscultation, no signs of consolidation by percussion or palpation, normal fremitus, symmetrical and full respiratory excursions; healthy right subclavian pacemaker site Cardiovascular: normal position and quality of the apical impulse, regular rhythm, normal first and second heart sounds, no rubs or gallops, 2/6 decrescendo murmur at the base of the sternum lateral and the left than on the right side, grade 1-2/6 harsh systolic ejection murmur at the left upper sternal border, grade 2/6 holosystolic murmur at the left lower sternal border at the apex Abdomen: no tenderness or distention, no masses by palpation, no abnormal pulsatility or arterial bruits, normal bowel sounds,  no hepatosplenomegaly Extremities: no clubbing, cyanosis or edema; 2+ radial, ulnar and brachial pulses bilaterally; 2+ right femoral, posterior tibial and dorsalis pedis pulses; 2+ left femoral, posterior tibial and dorsalis pedis pulses; no subclavian or femoral bruits Neurological: grossly nonfocal   EKG: 100% atrioventricular sequential pacing  BMET    Component Value Date/Time   NA 139 06/26/2013 0958   K 4.5 06/26/2013 0958   CL 104 06/26/2013 0958   CO2 30 06/26/2013 0958   GLUCOSE 103* 06/26/2013 0958   BUN 19 06/26/2013 0958   CREATININE 1.2 06/26/2013 0958   CALCIUM 9.7 06/26/2013 0958   GFRNONAA 53* 07/13/2010 0328   GFRAA  Value: >60  The eGFR has been calculated using the MDRD equation. This calculation has not been validated in all clinical situations. eGFR's persistently <60 mL/min signify possible Chronic Kidney Disease. 07/13/2010 0328     ASSESSMENT AND PLAN Complete heart block Pacemaker dependent. She is poorly tolerant of ventricular pacing threshold testing  Pacemaker Normal dual chamber permanent pacemaker functioning. Almost 100% atrial pacing and 100% ventricular pacing. No permanent device reprogramming performed.  Pulmonary stenosis sp valvotomy Last echo April 2013 showed mild to moderate pulmonary valve regurgitation and no residual pulmonary valve stenosis normal right ventricular size with mildly reduced right ventricular systolic function  Status post patch closure of ASD Mild to moderately dilated left atrium. No residual ASD by echo.   Patient Instructions  Remote monitoring is used to monitor your pacemaker from home. This monitoring reduces the number of office visits required to check your device to one time per year. It allows [place] to keep an eye on the functioning of your device to ensure it is working properly. You are scheduled for a device check from home on 01-29-2014. You may send your transmission at any time that day. If you have a wireless device,  the transmission will be sent automatically. After your physician reviews your transmission, you will receive a postcard with your next transmission date.  Your physician recommends that you schedule a follow-up appointment in: 6 months        Orders Placed This Encounter  Procedures  . Implantable device check  . EKG 12-Lead   No orders of the defined types were placed in this encounter.    Junious Silk, MD, [facility] [phone] office [phone] pager

## 2014-01-29 ENCOUNTER — Encounter: Payer: Self-pay | Admitting: Internal Medicine

## 2014-01-29 ENCOUNTER — Ambulatory Visit (INDEPENDENT_AMBULATORY_CARE_PROVIDER_SITE_OTHER): Payer: PRIVATE HEALTH INSURANCE | Admitting: *Deleted

## 2014-01-29 DIAGNOSIS — I4892 Unspecified atrial flutter: Secondary | ICD-10-CM

## 2014-01-29 DIAGNOSIS — I442 Atrioventricular block, complete: Secondary | ICD-10-CM

## 2014-01-29 DIAGNOSIS — I4891 Unspecified atrial fibrillation: Secondary | ICD-10-CM

## 2014-01-29 LAB — MDC_IDC_ENUM_SESS_TYPE_REMOTE
Battery Impedance: 2150 Ohm
Battery Remaining Longevity: 19 mo
Battery Voltage: 2.71 V
Brady Statistic AP VP Percent: 97 %
Brady Statistic AP VS Percent: 0 %
Brady Statistic AS VP Percent: 3 %
Brady Statistic AS VS Percent: 0 %
Date Time Interrogation Session: 20150207052926
Lead Channel Impedance Value: 497 Ohm
Lead Channel Impedance Value: 741 Ohm
Lead Channel Pacing Threshold Amplitude: 0.875 V
Lead Channel Pacing Threshold Pulse Width: 0.4 ms
Lead Channel Setting Pacing Amplitude: 2.25 V
Lead Channel Setting Pacing Amplitude: 2.5 V
Lead Channel Setting Pacing Pulse Width: 0.4 ms
Lead Channel Setting Sensing Sensitivity: 4 mV

## 2014-02-22 ENCOUNTER — Other Ambulatory Visit: Payer: Self-pay | Admitting: Cardiovascular Disease

## 2014-02-22 NOTE — Telephone Encounter (Signed)
Rx was sent to pharmacy electronically. 

## 2014-02-27 ENCOUNTER — Encounter: Payer: Self-pay | Admitting: *Deleted

## 2014-03-21 ENCOUNTER — Other Ambulatory Visit: Payer: Self-pay

## 2014-03-21 MED ORDER — FUROSEMIDE 40 MG PO TABS: 40.0000 mg | ORAL_TABLET | Freq: Every day | ORAL | Status: DC

## 2014-03-21 NOTE — Telephone Encounter (Signed)
Rx was sent to pharmacy electronically. 

## 2014-03-29 ENCOUNTER — Other Ambulatory Visit: Payer: Self-pay | Admitting: Cardiovascular Disease

## 2014-03-30 ENCOUNTER — Other Ambulatory Visit: Payer: Self-pay | Admitting: *Deleted

## 2014-03-30 MED ORDER — RIVAROXABAN 20 MG PO TABS: 20.0000 mg | ORAL_TABLET | Freq: Every day | ORAL | Status: DC

## 2014-04-24 ENCOUNTER — Ambulatory Visit (INDEPENDENT_AMBULATORY_CARE_PROVIDER_SITE_OTHER): Payer: PRIVATE HEALTH INSURANCE | Admitting: Cardiovascular Disease

## 2014-04-24 ENCOUNTER — Encounter: Payer: Self-pay | Admitting: Cardiovascular Disease

## 2014-04-24 VITALS — BP 138/80 | HR 93 | Resp 16 | Ht 63.0 in | Wt 154.5 lb

## 2014-04-24 DIAGNOSIS — I4892 Unspecified atrial flutter: Secondary | ICD-10-CM

## 2014-04-24 DIAGNOSIS — I4891 Unspecified atrial fibrillation: Secondary | ICD-10-CM

## 2014-04-24 DIAGNOSIS — Z950 Presence of cardiac pacemaker: Secondary | ICD-10-CM

## 2014-04-24 DIAGNOSIS — I442 Atrioventricular block, complete: Secondary | ICD-10-CM

## 2014-04-24 LAB — MDC_IDC_ENUM_SESS_TYPE_INCLINIC
Battery Impedance: 2375 Ohm
Battery Remaining Longevity: 17 mo
Battery Voltage: 2.7 V
Brady Statistic AP VP Percent: 95.9 %
Brady Statistic AP VS Percent: 0.9 %
Brady Statistic AS VP Percent: 3.1 %
Brady Statistic AS VS Percent: 0.1 % — CL
Lead Channel Impedance Value: 507 Ohm
Lead Channel Impedance Value: 802 Ohm
Lead Channel Pacing Threshold Amplitude: 0.75 V
Lead Channel Pacing Threshold Amplitude: 1 V
Lead Channel Pacing Threshold Pulse Width: 0.4 ms
Lead Channel Pacing Threshold Pulse Width: 0.4 ms
Lead Channel Sensing Intrinsic Amplitude: 2.8 mV
Lead Channel Setting Pacing Amplitude: 2.25 V
Lead Channel Setting Pacing Amplitude: 2.5 V
Lead Channel Setting Pacing Pulse Width: 0.4 ms
Lead Channel Setting Sensing Sensitivity: 4 mV

## 2014-04-24 LAB — PACEMAKER DEVICE OBSERVATION

## 2014-04-24 NOTE — Assessment & Plan Note (Signed)
Normal device function, no permanent reprogramming.

## 2014-04-24 NOTE — Progress Notes (Signed)
Patient ID: KALAH PFLUM, female   DOB: 1933/03/10, 80 y.o.   MRN: [id number]      Reason for office visit  Pacemaker check/complete heart block, atrial flutter status post ablation, history of pulmonic stenosis status post valvotomy and atrial septal defect status post patch closure.   [name] Baird is feeling well. Not been troubled by palpitations, dyspnea, chest tightness or syncope. Her major complaint right now is glaucoma and rising high pressures with some change in her visual acuity. She has not had any bleeding complications or any stroke/TIA or other embolic events. She intermittently develops ankle edema, promptly resolving after an increase in dose of diuretic  She had a flutter ablation performed by Dr. Rayann Heman in July 2014 and is not taking any antiarrhythmics at this time, other than a low dose of beta blocker. At the time of the EP study she was noted to have conventional counterclockwise isthmus-dependent right atrial flutter but then a second type of right atrial flutter was noted tbriefly. It could not be reinduced for full study.  She has complete heart block and is pacemaker dependent (Medtronic Adapta). Her pacemaker generator is expected to reach ERI in late 2016.  Her pacemaker has had issues with far field R wave oversensing as well as crosstalk, especially when atrial overdrive pacing was attempted. She has a history of pulmonic stenosis undergoing pulmonary valvotomy in 1985 and repair of an atrial septal defect at the same time. Her echocardiogram shows mild to moderate aortic insufficiency, mild pulmonic insufficiency and mild mitral insufficiency. He has mildly reduced left ventricular systolic function, largely related to ventricular dyssynchrony from right ventricular pacing.  Since her last pacemaker interrogation there has been 97% right atrial pacing and 100% ventricular pacing. Has not had any atrial arrhythmia since last November. Far field R wave oversensing has led to  mode switch and reprogramming of her post ventricular atrial blanking today and hopefully will take care of that problem.    Allergies  Allergen Reactions  . Sulfa Antibiotics Other (See Comments)    welps   . Hydrocodone     nausea    Current Outpatient Prescriptions  Medication Sig Dispense Refill  . acetaminophen (TYLENOL ARTHRITIS PAIN) 650 MG CR tablet Take 1,300 mg by mouth daily as needed for pain.       . brimonidine (ALPHAGAN P) 0.1 % SOLN Apply 1 drop to eye daily.      [name] Kitchen BYSTOLIC 10 MG tablet TAKE 1 TABLET DAILY  90 tablet  1  . calcium-vitamin D (OSCAL) 250-125 MG-UNIT per tablet Take 1 tablet by mouth daily.      . clonazePAM (KLONOPIN) 0.5 MG tablet Take 0.5 mg by mouth daily as needed.       . dorzolamide-timolol (COSOPT) 22.3-6.8 MG/ML ophthalmic solution Place 2 drops into both eyes 2 (two) times daily.      . furosemide (LASIX) 40 MG tablet Take 1 tablet (40 mg total) by mouth daily.  30 tablet  7  . latanoprost (XALATAN) 0.005 % ophthalmic solution Place 1 drop into both eyes at bedtime.      [name] Kitchen losartan (COZAAR) 50 MG tablet Take 50 mg by mouth daily.      . Multiple Vitamins-Minerals (MULTIVITAMIN PO) Take 1 tablet by mouth daily.       . pilocarpine (PILOCAR) 1 % ophthalmic solution Place 4 drops into the left eye daily.       . potassium chloride (K-DUR) 10 MEQ tablet Take 2 tablets (20  mEq total) by mouth daily.  180 tablet  3  . Rivaroxaban (XARELTO) 20 MG TABS tablet Take 1 tablet (20 mg total) by mouth daily with supper.  30 tablet  2  . rosuvastatin (CRESTOR) 10 MG tablet Take 10 mg by mouth at bedtime.      [name] Kitchen venlafaxine (EFFEXOR) 75 MG tablet Take 75 mg by mouth daily.        No current facility-administered medications for this visit.    Past Medical History  Diagnosis Date  . Hypertension   . Atrial flutter     s/p ablation 06/29/2013 by Dr Rayann Heman  . Complete heart block 2007    s/p medtronic pacemaker implantation  . Depression   . Anxiety   .  CHF (congestive heart failure)   . Glaucoma   . Status post patch closure of ASD   . Pulmonary stenosis sp valvotomy        . Shortness of breath     Past Surgical History  Procedure Laterality Date  . Aortic valve repair    . Pulmonary valvotomy  1985  . Trigeminal sx      left side  . Cardioversion N/A 03/24/2013    Procedure: CARDIOVERSION;  Surgeon: Sanda Klein, MD;  Location: [facility];  Service: Cardiovascular;  Laterality: N/A;  . Pacemaker insertion  2007    [place], serial # [id number]  . Ablation  06/29/2013    s/p isthmus ablation for atrial flutter by Dr Rayann Heman    No family history on file.  History   Social History  . Marital Status: Widowed    Spouse Name: N/A    Number of Children: 2  . Years of Education: N/A   Occupational History  . Not on file.   Social History Main Topics  . Smoking status: Never Smoker   . Smokeless tobacco: Never Used  . Alcohol Use: No  . Drug Use: No  . Sexual Activity: No   Other Topics Concern  . Not on file   Social History Narrative  . No narrative on file    Review of systems: The patient specifically denies any chest pain at rest or with exertion, dyspnea at rest or with exertion, orthopnea, paroxysmal nocturnal dyspnea, syncope, palpitations, focal neurological deficits, intermittent claudication, lower extremity edema, unexplained weight gain, cough, hemoptysis or wheezing.  The patient also denies abdominal pain, nausea, vomiting, dysphagia, diarrhea, constipation, polyuria, polydipsia, dysuria, hematuria, frequency, urgency, abnormal bleeding or bruising, fever, chills, unexpected weight changes, mood swings, change in skin or hair texture, change in voice quality, auditory or visual problems, allergic reactions or rashes, new musculoskeletal complaints other than usual "aches and pains".   PHYSICAL EXAM BP 138/80  Pulse 93  Resp 16  Ht _0  (1.6 m)  Wt 154 lb 8 oz (70.081 kg)  BMI 27.38  kg/m2  General: Alert, oriented x3, no distress Head: no evidence of trauma, PERRL, EOMI, no exophtalmos or lid lag, no myxedema, no xanthelasma; normal ears, nose and oropharynx Neck: normal jugular venous pulsations and no hepatojugular reflux; brisk carotid pulses without delay and no carotid bruits Chest: clear to auscultation, no signs of consolidation by percussion or palpation, normal fremitus, symmetrical and full respiratory excursions Cardiovascular: normal position and quality of the apical impulse, regular rhythm, normal first and paradoxically split second heart sounds, no murmurs, rubs or gallops Abdomen: no tenderness or distention, no masses by palpation, no abnormal pulsatility or arterial bruits, normal bowel sounds, no hepatosplenomegaly  Extremities: no clubbing, cyanosis or edema; 2+ radial, ulnar and brachial pulses bilaterally; 2+ right femoral, posterior tibial and dorsalis pedis pulses; 2+ left femoral, posterior tibial and dorsalis pedis pulses; no subclavian or femoral bruits Neurological: grossly nonfocal   EKG: AV sequential pacing  Lipid Panel  No results found for this basename: chol, trig, hdl, cholhdl, vldl, ldlcalc    BMET    Component Value Date/Time   NA 139 06/26/2013 0958   K 4.5 06/26/2013 0958   CL 104 06/26/2013 0958   CO2 30 06/26/2013 0958   GLUCOSE 103* 06/26/2013 0958   BUN 19 06/26/2013 0958   CREATININE 1.2 06/26/2013 0958   CALCIUM 9.7 06/26/2013 0958   GFRNONAA 53* 07/13/2010 0328   GFRAA  Value: >60        The eGFR has been calculated using the MDRD equation. This calculation has not been validated in all clinical situations. eGFR's persistently <60 mL/min signify possible Chronic Kidney Disease. 07/13/2010 0328     ASSESSMENT AND PLAN No problem-specific assessment & plan notes found for this encounter.  Orders Placed This Encounter  Procedures  . EKG 12-Lead   Meds ordered this encounter  Medications  . brimonidine (ALPHAGAN P) 0.1 % SOLN     Sig: Apply 1 drop to eye daily.    Destinee Taber  Sanda Klein, MD, [facility] [phone] office [phone] pager

## 2014-04-24 NOTE — Assessment & Plan Note (Signed)
Pacemaker dependent 

## 2014-04-24 NOTE — Assessment & Plan Note (Signed)
No recurrence since ablation.

## 2014-04-24 NOTE — Assessment & Plan Note (Signed)
Rare self-limited episodes in the past, none in the last 3 months. Anticoagulation is well tolerated.

## 2014-04-24 NOTE — Patient Instructions (Signed)
Remote monitoring is used to monitor your pacemaker from home. This monitoring reduces the number of office visits required to check your device to one time per year. It allows us to keep an eye on the functioning of your device to ensure it is working properly. You are scheduled for a device check from home on 07-26-2014. You may send your transmission at any time that day. If you have a wireless device, the transmission will be sent automatically. After your physician reviews your transmission, you will receive a postcard with your next transmission date.  Your physician recommends that you schedule a follow-up appointment in: 6 months with Dr.Croitoru    

## 2014-04-30 ENCOUNTER — Encounter: Payer: Self-pay | Admitting: Cardiovascular Disease

## 2014-05-23 ENCOUNTER — Telehealth: Payer: Self-pay | Admitting: Cardiovascular Disease

## 2014-05-23 MED ORDER — LOSARTAN POTASSIUM 50 MG PO TABS: 50.0000 mg | ORAL_TABLET | Freq: Every day | ORAL | Status: DC

## 2014-05-23 MED ORDER — POTASSIUM CHLORIDE ER 10 MEQ PO TBCR: 20.0000 meq | EXTENDED_RELEASE_TABLET | Freq: Every day | ORAL | Status: DC

## 2014-05-23 MED ORDER — FUROSEMIDE 40 MG PO TABS: 40.0000 mg | ORAL_TABLET | Freq: Every day | ORAL | Status: DC

## 2014-05-23 MED ORDER — RIVAROXABAN 20 MG PO TABS: 20.0000 mg | ORAL_TABLET | Freq: Every day | ORAL | Status: DC

## 2014-05-23 NOTE — Telephone Encounter (Signed)
Called patient's daughter to clarify lasix dosing. She states Dr C said is OK to extra dose of furosemide daily if wt gain >3lbs. RN sees this documented to some degree in November 2014 OV note.  Rx was sent to pharmacy electronically. Also refilled losartan, xarelto, potassium, furosemide

## 2014-05-23 NOTE — Telephone Encounter (Signed)
[facility] wanted you to be on the look out for a fax on her medications.Her fluid medicine quainity should be changed to 2 tablets a day so it will 180 instead of 90.

## 2014-07-24 ENCOUNTER — Telehealth: Payer: Self-pay | Admitting: Cardiovascular Disease

## 2014-07-24 ENCOUNTER — Telehealth: Payer: Self-pay | Admitting: *Deleted

## 2014-07-24 NOTE — Telephone Encounter (Signed)
Follow Up    Originally faxed over a letter for Xarelto meds to be continued 7/29. States office needs to call and confirm in order to stop D/C of this medication. Calling to follow up, please call.

## 2014-07-24 NOTE — Telephone Encounter (Signed)
Prior auth done for Xarelto 06/24/2014 - 07/24/2015.

## 2014-07-24 NOTE — Telephone Encounter (Signed)
Forwarded to Barbara 

## 2014-07-24 NOTE — Telephone Encounter (Signed)
Prior auth done for Xarelto 06/24/14 - 07/24/2015.  Daughter notified and voiced understanding.

## 2014-07-26 ENCOUNTER — Telehealth: Payer: Self-pay | Admitting: Cardiology

## 2014-07-26 ENCOUNTER — Ambulatory Visit (INDEPENDENT_AMBULATORY_CARE_PROVIDER_SITE_OTHER): Payer: PRIVATE HEALTH INSURANCE | Admitting: *Deleted

## 2014-07-26 DIAGNOSIS — I442 Atrioventricular block, complete: Secondary | ICD-10-CM

## 2014-07-26 DIAGNOSIS — I4891 Unspecified atrial fibrillation: Secondary | ICD-10-CM

## 2014-07-26 DIAGNOSIS — I480 Paroxysmal atrial fibrillation: Secondary | ICD-10-CM

## 2014-07-26 NOTE — Telephone Encounter (Signed)
LMOV[facility] reminding pt to send remote transmission.   

## 2014-07-27 NOTE — Progress Notes (Signed)
Remote pacemaker transmission.   

## 2014-07-30 LAB — MDC_IDC_ENUM_SESS_TYPE_REMOTE
Battery Impedance: 2931 Ohm
Battery Remaining Longevity: 14 mo
Battery Voltage: 2.69 V
Brady Statistic AP VP Percent: 94 %
Brady Statistic AP VS Percent: 1 %
Brady Statistic AS VP Percent: 5 %
Brady Statistic AS VS Percent: 0 %
Date Time Interrogation Session: 20150806212440
Lead Channel Impedance Value: 514 Ohm
Lead Channel Impedance Value: 812 Ohm
Lead Channel Pacing Threshold Amplitude: 0.75 V
Lead Channel Pacing Threshold Amplitude: 0.875 V
Lead Channel Pacing Threshold Pulse Width: 0.4 ms
Lead Channel Pacing Threshold Pulse Width: 0.4 ms
Lead Channel Sensing Intrinsic Amplitude: 4 mV
Lead Channel Setting Pacing Amplitude: 2 V
Lead Channel Setting Pacing Amplitude: 2.5 V
Lead Channel Setting Pacing Pulse Width: 0.4 ms
Lead Channel Setting Sensing Sensitivity: 4 mV

## 2014-08-03 ENCOUNTER — Telehealth: Payer: Self-pay | Admitting: *Deleted

## 2014-08-03 NOTE — Telephone Encounter (Signed)
Xarelto has been approved effective 06/24/14-07/24/2015.

## 2014-08-06 ENCOUNTER — Encounter: Payer: Self-pay | Admitting: Cardiology

## 2014-08-08 ENCOUNTER — Encounter: Payer: Self-pay | Admitting: Internal Medicine

## 2014-08-28 ENCOUNTER — Other Ambulatory Visit: Payer: Self-pay

## 2014-08-28 DIAGNOSIS — Z1231 Encounter for screening mammogram for malignant neoplasm of breast: Secondary | ICD-10-CM

## 2014-08-29 ENCOUNTER — Other Ambulatory Visit: Payer: Self-pay | Admitting: Pharmacist Clinician (PhC)/ Clinical Pharmacy Specialist

## 2014-08-29 MED ORDER — RIVAROXABAN 20 MG PO TABS: 20.0000 mg | ORAL_TABLET | Freq: Every day | ORAL | Status: DC

## 2014-09-02 ENCOUNTER — Other Ambulatory Visit: Payer: Self-pay | Admitting: Cardiovascular Disease

## 2014-09-09 ENCOUNTER — Other Ambulatory Visit: Payer: Self-pay | Admitting: Cardiovascular Disease

## 2014-10-23 ENCOUNTER — Ambulatory Visit
Admission: RE | Admit: 2014-10-23 | Discharge: 2014-10-23 | Disposition: A | Discharge status: Home / Self Care | Payer: PRIVATE HEALTH INSURANCE | Source: Ambulatory Visit

## 2014-10-23 DIAGNOSIS — Z1231 Encounter for screening mammogram for malignant neoplasm of breast: Secondary | ICD-10-CM

## 2014-11-08 ENCOUNTER — Encounter: Payer: Self-pay | Admitting: Cardiovascular Disease

## 2014-11-08 ENCOUNTER — Ambulatory Visit (INDEPENDENT_AMBULATORY_CARE_PROVIDER_SITE_OTHER): Payer: PRIVATE HEALTH INSURANCE | Admitting: Cardiovascular Disease

## 2014-11-08 VITALS — BP 128/72 | HR 99 | Resp 16 | Ht 63.0 in | Wt 158.7 lb

## 2014-11-08 DIAGNOSIS — Z950 Presence of cardiac pacemaker: Secondary | ICD-10-CM

## 2014-11-08 DIAGNOSIS — I4891 Unspecified atrial fibrillation: Secondary | ICD-10-CM

## 2014-11-08 DIAGNOSIS — I442 Atrioventricular block, complete: Secondary | ICD-10-CM

## 2014-11-08 DIAGNOSIS — I484 Atypical atrial flutter: Secondary | ICD-10-CM

## 2014-11-08 DIAGNOSIS — Z9889 Other specified postprocedural states: Secondary | ICD-10-CM

## 2014-11-08 DIAGNOSIS — I370 Nonrheumatic pulmonary valve stenosis: Secondary | ICD-10-CM

## 2014-11-08 DIAGNOSIS — Z8774 Personal history of (corrected) congenital malformations of heart and circulatory system: Secondary | ICD-10-CM

## 2014-11-08 LAB — MDC_IDC_ENUM_SESS_TYPE_INCLINIC
Battery Impedance: 3311 Ohm
Battery Remaining Longevity: 11 mo
Battery Voltage: 2.67 V
Brady Statistic AP VP Percent: 93 %
Brady Statistic AP VS Percent: 1 %
Brady Statistic AS VP Percent: 5 %
Brady Statistic AS VS Percent: 0 %
Date Time Interrogation Session: 20151119104655
Lead Channel Impedance Value: 509 Ohm
Lead Channel Impedance Value: 773 Ohm
Lead Channel Pacing Threshold Amplitude: 0.75 V
Lead Channel Pacing Threshold Amplitude: 1 V
Lead Channel Pacing Threshold Pulse Width: 0.4 ms
Lead Channel Pacing Threshold Pulse Width: 0.4 ms
Lead Channel Sensing Intrinsic Amplitude: 2.8 mV
Lead Channel Setting Pacing Amplitude: 2.5 V
Lead Channel Setting Pacing Amplitude: 2.5 V
Lead Channel Setting Pacing Pulse Width: 0.4 ms
Lead Channel Setting Sensing Sensitivity: 4 mV

## 2014-11-08 NOTE — Progress Notes (Signed)
Patient ID: [name] Baird, female   DOB: 10/20/1933, 80 y.o.   MRN: [id number]      Reason for office visit [facility] s/p pacemaker, atrial flutter, HTN, pulmonary stenosis  [name] Baird is here to follow-up on her pacemaker and has not had any serious medical problems since her last visit. She is limited primarily by knee pain related to arthritis and also has worsening vision from glaucoma, especially in her right eye. Shortness of breath does not occur during usual physical activity, but she does notice it when she bends over.  Pacemaker interrogation shows normal device function with an estimated battery longevity of about 11 months. She has 100% ventricular pacing and no escape rhythm whatsoever. She become severely symptomatic during ventricular threshold testing. Lead parameters are excellent otherwise. She also paces the atrium roughly 94% of the time. The overall burden of atrial mode switch has been less than 0.1%. There has been no evidence of true atrial flutter or atrial fibrillation. She has fairly frequent PVCs. Heart rate histogram distribution is favorable. Atrial preference pacing is on.  She had a flutter ablation performed by Dr. Rayann Heman in July 2014 and is not taking any antiarrhythmics at this time, other than a low dose of beta blocker. At the time of the EP study she was noted to have conventional counterclockwise isthmus-dependent right atrial flutter but then a second type of right atrial flutter was noted tbriefly. It could not be reinduced for full study.  She has complete heart block and is pacemaker dependent (Medtronic Adapta). Her pacemaker generator is expected to reach ERI in late 2016.  Her pacemaker has had issues with far field R wave oversensing as well as crosstalk, especially when atrial overdrive pacing was attempted. She has a history of pulmonic stenosis undergoing pulmonary valvotomy in 1985 and repair of an atrial septal defect at the same time. Her echocardiogram shows  mild to moderate aortic insufficiency, mild pulmonic insufficiency and mild mitral insufficiency. He has mildly reduced left ventricular systolic function, largely related to ventricular dyssynchrony from right ventricular pacing.    Allergies  Allergen Reactions  . Sulfa Antibiotics Other (See Comments)    welps   . Hydrocodone     nausea    Current Outpatient Prescriptions  Medication Sig Dispense Refill  . acetaminophen (TYLENOL ARTHRITIS PAIN) 650 MG CR tablet Take 1,300 mg by mouth daily as needed for pain.     . brimonidine (ALPHAGAN P) 0.1 % SOLN Apply 1 drop to eye daily.    [name] Kitchen BYSTOLIC 10 MG tablet TAKE 1 TABLET DAILY 90 tablet 0  . calcium-vitamin D (OSCAL) 250-125 MG-UNIT per tablet Take 1 tablet by mouth daily.    . clonazePAM (KLONOPIN) 0.5 MG tablet Take 0.5 mg by mouth daily as needed.     . dorzolamide-timolol (COSOPT) 22.3-6.8 MG/ML ophthalmic solution Place 2 drops into both eyes 2 (two) times daily.    . furosemide (LASIX) 40 MG tablet Take 1 tablet (40 mg total) by mouth daily. May take extra dose (42m) as needed for weight gain daily. 180 tablet 2  . latanoprost (XALATAN) 0.005 % ophthalmic solution Place 1 drop into both eyes at bedtime.    .[name] Kitchenlosartan (COZAAR) 50 MG tablet Take 1 tablet (50 mg total) by mouth daily. 90 tablet 2  . Multiple Vitamins-Minerals (MULTIVITAMIN PO) Take 1 tablet by mouth daily.     . pilocarpine (PILOCAR) 1 % ophthalmic solution Place 4 drops into the left eye daily.     .[name] Kitchen  potassium chloride (K-DUR) 10 MEQ tablet Take 2 tablets (20 mEq total) by mouth daily. 180 tablet 2  . rivaroxaban (XARELTO) 20 MG TABS tablet Take 1 tablet (20 mg total) by mouth daily with supper. 90 tablet 1  . rosuvastatin (CRESTOR) 10 MG tablet Take 10 mg by mouth at bedtime.    [name] Kitchen venlafaxine (EFFEXOR) 75 MG tablet Take 75 mg by mouth daily.      No current facility-administered medications for this visit.    Past Medical History  Diagnosis Date  .  Hypertension   . Atrial flutter     s/p ablation 06/29/2013 by Dr Rayann Heman  . Complete heart block 2007    s/p medtronic pacemaker implantation  . Depression   . Anxiety   . CHF (congestive heart failure)   . Glaucoma   . Status post patch closure of ASD   . Pulmonary stenosis sp valvotomy        . Shortness of breath     Past Surgical History  Procedure Laterality Date  . Aortic valve repair    . Pulmonary valvotomy  1985  . Trigeminal sx      left side  . Cardioversion N/A 03/24/2013    Procedure: CARDIOVERSION;  Surgeon: Sanda Klein, MD;  Location: [place];  Service: Cardiovascular;  Laterality: N/A;  . Pacemaker insertion  2007    [place], serial # [id number]  . Ablation  06/29/2013    s/p isthmus ablation for atrial flutter by Dr Rayann Heman    No family history on file.  History   Social History  . Marital Status: Widowed    Spouse Name: N/A    Number of Children: 2  . Years of Education: N/A   Occupational History  . Not on file.   Social History Main Topics  . Smoking status: Never Smoker   . Smokeless tobacco: Never Used  . Alcohol Use: No  . Drug Use: No  . Sexual Activity: No   Other Topics Concern  . Not on file   Social History Narrative    Review of systems: The patient specifically denies any chest pain at rest or with exertion, dyspnea at rest or with exertion, orthopnea, paroxysmal nocturnal dyspnea, syncope, palpitations, focal neurological deficits, intermittent claudication, lower extremity edema, unexplained weight gain, cough, hemoptysis or wheezing.  The patient also denies abdominal pain, nausea, vomiting, dysphagia, diarrhea, constipation, polyuria, polydipsia, dysuria, hematuria, frequency, urgency, abnormal bleeding or bruising, fever, chills, unexpected weight changes, mood swings, change in skin or hair texture, change in voice quality, auditory or visual problems, allergic reactions or rashes, new musculoskeletal  complaints other than usual "aches and pains".   PHYSICAL EXAM BP 128/72 mmHg  Pulse 99  Resp 16  Ht 5' 3"  (1.6 m)  Wt 158 lb 11.2 oz (71.986 kg)  BMI 28.12 kg/m2   General: Alert, oriented x3, no distress Head: no evidence of trauma, PERRL, EOMI, no exophtalmos or lid lag, no myxedema, no xanthelasma; normal ears, nose and oropharynx Neck: normal jugular venous pulsations and no hepatojugular reflux; brisk carotid pulses without delay and no carotid bruits Chest: clear to auscultation, no signs of consolidation by percussion or palpation, normal fremitus, symmetrical and full respiratory excursions Cardiovascular: normal position and quality of the apical impulse, regular rhythm, normal first and paradoxically split second heart sounds, no murmurs, rubs or gallops Abdomen: no tenderness or distention, no masses by palpation, no abnormal pulsatility or arterial bruits, normal bowel sounds, no hepatosplenomegaly Extremities: no  clubbing, cyanosis or edema; 2+ radial, ulnar and brachial pulses bilaterally; 2+ right femoral, posterior tibial and dorsalis pedis pulses; 2+ left femoral, posterior tibial and dorsalis pedis pulses; no subclavian or femoral bruits Neurological: grossly nonfocal   EKG: AV sequential pacing  BMET    Component Value Date/Time   NA 139 06/26/2013 0958   K 4.5 06/26/2013 0958   CL 104 06/26/2013 0958   CO2 30 06/26/2013 0958   GLUCOSE 103* 06/26/2013 0958   BUN 19 06/26/2013 0958   CREATININE 1.2 06/26/2013 0958   CALCIUM 9.7 06/26/2013 0958   GFRNONAA 53* 07/13/2010 0328   GFRAA  07/13/2010 0328    >60        The eGFR has been calculated using the MDRD equation. This calculation has not been validated in all clinical situations. eGFR's persistently <60 mL/min signify possible Chronic Kidney Disease.     ASSESSMENT AND PLAN  Complete heart block/pacemaker dependent with normally functioning dual-chamber device. Anticipate need for generator  change sometime next summer. It is advisable that she have a temporary transvenous ventricular pacing lead paced since she is so poorly tolerant when the ventricle pacing was stopped.  No evidence of recurrent arrhythmia. Anticoagulation has been well tolerated and discontinued since after her flutter ablation she has had some rare episodes of atrial fibrillation. Recurrent arrhythmia would not be unexpected considering her previous cardiac surgery for congenital heart disease. If she should have bleeding complications, however, I don't think anticoagulation is critical and could be temporarily stopped.  Her blood pressure is well controlled.  Plan remote pacemaker check in 3 months and follow-up office visit in 6 months. Orders Placed This Encounter  Procedures  . Implantable device check  . EKG 12-Lead   No orders of the defined types were placed in this encounter.    Holli Humbles, MD, [place] [phone] office [phone] pager

## 2014-11-08 NOTE — Patient Instructions (Addendum)
Remote monitoring is used to monitor your pacemaker from home. This monitoring reduces the number of office visits required to check your device to one time per year. It allows us to keep an eye on the functioning of your device to ensure it is working properly. You are scheduled for a device check from home on 02-11-2015. You may send your transmission at any time that day. If you have a wireless device, the transmission will be sent automatically. After your physician reviews your transmission, you will receive a postcard with your next transmission date.  Your physician recommends that you schedule a follow-up appointment in: 6 months with Dr.Croitoru

## 2014-11-21 ENCOUNTER — Encounter: Payer: Self-pay | Admitting: *Deleted

## 2014-11-29 ENCOUNTER — Encounter (HOSPITAL_COMMUNITY): Payer: Self-pay | Admitting: Internal Medicine

## 2014-12-01 ENCOUNTER — Other Ambulatory Visit: Payer: Self-pay | Admitting: Cardiovascular Disease

## 2014-12-03 NOTE — Telephone Encounter (Signed)
Rx refill sent to patient pharmacy   

## 2014-12-08 ENCOUNTER — Other Ambulatory Visit: Payer: Self-pay | Admitting: Cardiovascular Disease

## 2015-01-29 ENCOUNTER — Other Ambulatory Visit: Payer: Self-pay | Admitting: Cardiovascular Disease

## 2015-01-29 NOTE — Telephone Encounter (Signed)
Rx(s) sent to pharmacy electronically.  

## 2015-01-30 ENCOUNTER — Other Ambulatory Visit: Payer: Self-pay | Admitting: Cardiovascular Disease

## 2015-01-30 NOTE — Telephone Encounter (Signed)
Rx refill sent to patient pharmacy   

## 2015-02-11 ENCOUNTER — Telehealth: Payer: Self-pay | Admitting: Cardiology

## 2015-02-11 ENCOUNTER — Ambulatory Visit (INDEPENDENT_AMBULATORY_CARE_PROVIDER_SITE_OTHER): Payer: Medicare Other | Admitting: *Deleted

## 2015-02-11 DIAGNOSIS — I442 Atrioventricular block, complete: Secondary | ICD-10-CM

## 2015-02-11 LAB — MDC_IDC_ENUM_SESS_TYPE_REMOTE
Battery Impedance: 4115 Ohm
Battery Remaining Longevity: 7 mo
Battery Voltage: 2.65 V
Brady Statistic AP VP Percent: 92 %
Brady Statistic AP VS Percent: 1 %
Brady Statistic AS VP Percent: 6 %
Brady Statistic AS VS Percent: 0 %
Date Time Interrogation Session: 20160222221055
Lead Channel Impedance Value: 527 Ohm
Lead Channel Impedance Value: 778 Ohm
Lead Channel Pacing Threshold Amplitude: 0.75 V
Lead Channel Pacing Threshold Amplitude: 1 V
Lead Channel Pacing Threshold Pulse Width: 0.4 ms
Lead Channel Pacing Threshold Pulse Width: 0.4 ms
Lead Channel Setting Pacing Amplitude: 2.25 V
Lead Channel Setting Pacing Amplitude: 2.5 V
Lead Channel Setting Pacing Pulse Width: 0.4 ms
Lead Channel Setting Sensing Sensitivity: 4 mV

## 2015-02-11 NOTE — Telephone Encounter (Signed)
LMOV[facility] reminding pt to send remote transmission.   

## 2015-02-12 NOTE — Progress Notes (Signed)
Remote pacemaker transmission.   

## 2015-02-22 ENCOUNTER — Encounter: Payer: Self-pay | Admitting: Cardiology

## 2015-02-27 ENCOUNTER — Encounter: Payer: Self-pay | Admitting: Cardiovascular Disease

## 2015-03-03 ENCOUNTER — Other Ambulatory Visit: Payer: Self-pay | Admitting: Cardiovascular Disease

## 2015-03-04 NOTE — Telephone Encounter (Signed)
Rx has been sent to the pharmacy electronically. ° °

## 2015-03-12 ENCOUNTER — Ambulatory Visit (INDEPENDENT_AMBULATORY_CARE_PROVIDER_SITE_OTHER): Payer: Medicare Other | Admitting: *Deleted

## 2015-03-12 ENCOUNTER — Encounter: Payer: Self-pay | Admitting: Cardiovascular Disease

## 2015-03-12 ENCOUNTER — Telehealth: Payer: Self-pay | Admitting: Cardiology

## 2015-03-12 DIAGNOSIS — Z950 Presence of cardiac pacemaker: Secondary | ICD-10-CM

## 2015-03-12 DIAGNOSIS — I442 Atrioventricular block, complete: Secondary | ICD-10-CM

## 2015-03-12 LAB — MDC_IDC_ENUM_SESS_TYPE_REMOTE
Battery Impedance: 4341 Ohm
Battery Remaining Longevity: 6 mo
Battery Voltage: 2.65 V
Brady Statistic AP VP Percent: 92 %
Brady Statistic AP VS Percent: 1 %
Brady Statistic AS VP Percent: 7 %
Brady Statistic AS VS Percent: 0 %
Date Time Interrogation Session: 20160322205320
Lead Channel Impedance Value: 554 Ohm
Lead Channel Impedance Value: 795 Ohm
Lead Channel Pacing Threshold Amplitude: 0.75 V
Lead Channel Pacing Threshold Amplitude: 1.125 V
Lead Channel Pacing Threshold Pulse Width: 0.4 ms
Lead Channel Pacing Threshold Pulse Width: 0.4 ms
Lead Channel Setting Pacing Amplitude: 2.25 V
Lead Channel Setting Pacing Amplitude: 2.5 V
Lead Channel Setting Pacing Pulse Width: 0.4 ms
Lead Channel Setting Sensing Sensitivity: 4 mV

## 2015-03-12 NOTE — Telephone Encounter (Signed)
LMOV[facility] reminding pt to send remote transmission.   

## 2015-03-13 NOTE — Progress Notes (Signed)
Remote pacemaker transmission.   

## 2015-03-19 ENCOUNTER — Encounter: Payer: Self-pay | Admitting: Cardiology

## 2015-03-25 ENCOUNTER — Telehealth: Payer: Self-pay | Admitting: Cardiovascular Disease

## 2015-03-25 NOTE — Telephone Encounter (Signed)
What milligram of Xarelto is she suppose to be taking? She also had a battery check for her pacemaker on 03-12-15,have not heard anything about her next check. If it is after 5,please [phone]-[phone].

## 2015-03-25 NOTE — Telephone Encounter (Signed)
Spoke to HauserMisty.  Xarelto 20 mg daily . Next pacer transmission April 21,2016. Re- mail letter

## 2015-04-11 ENCOUNTER — Telehealth: Payer: Self-pay | Admitting: Cardiology

## 2015-04-11 ENCOUNTER — Ambulatory Visit (INDEPENDENT_AMBULATORY_CARE_PROVIDER_SITE_OTHER): Payer: Medicare Other | Admitting: *Deleted

## 2015-04-11 DIAGNOSIS — Z950 Presence of cardiac pacemaker: Secondary | ICD-10-CM

## 2015-04-11 LAB — MDC_IDC_ENUM_SESS_TYPE_REMOTE
Battery Impedance: 4502 Ohm
Battery Remaining Longevity: 5 mo
Battery Voltage: 2.64 V
Brady Statistic AP VP Percent: 92 %
Brady Statistic AP VS Percent: 1 %
Brady Statistic AS VP Percent: 7 %
Brady Statistic AS VS Percent: 0 %
Date Time Interrogation Session: 20160421160922
Lead Channel Impedance Value: 528 Ohm
Lead Channel Impedance Value: 784 Ohm
Lead Channel Pacing Threshold Amplitude: 0.625 V
Lead Channel Pacing Threshold Amplitude: 1.125 V
Lead Channel Pacing Threshold Pulse Width: 0.4 ms
Lead Channel Pacing Threshold Pulse Width: 0.4 ms
Lead Channel Setting Pacing Amplitude: 2.25 V
Lead Channel Setting Pacing Amplitude: 2.5 V
Lead Channel Setting Pacing Pulse Width: 0.4 ms
Lead Channel Setting Sensing Sensitivity: 4 mV

## 2015-04-11 NOTE — Telephone Encounter (Signed)
LMOV[facility] reminding pt to send remote transmission.   

## 2015-04-11 NOTE — Progress Notes (Signed)
Remote pacemaker transmission.   

## 2015-04-12 NOTE — Op Note (Signed)
PATIENT NAME:  [name] Baird, [name] Baird MR#:  [id number] DATE OF BIRTH:  03-16-1933  DATE OF PROCEDURE:  09/20/2013  PROCEDURES PERFORMED: 1.  Pars plana vitrectomy of the right eye.  2.  Internal limiting membrane peel of the right eye.  3.  Phacoemulsification and intraocular lens insertion of the right eye.   PREOPERATIVE DIAGNOSES: 1.  Macular hole of the right eye.  2.  Visually significant cataract of the right eye.  POSTOPERATIVE DIAGNOSES: 1.  Macular hole of the right eye.  2.  Visually significant cataract of the right eye.  ESTIMATED BLOOD LOSS: Less than 1 Ml.  PRIMARY SURGEON:  Aron Baba, M.D.   ANESTHESIA:  Retrobulbar block of the right eye with monitored anesthesia care.   INDICATION FOR PROCEDURE:  The patient presented to my office with decreasing vision in the right eye. Examination revealed a macular hole of the right eye with vitreomacular traction and a visually significant cataract of the right eye. The patient noted that she was having trouble with reading and watching television. Risks, benefits and alternatives of the above procedure were discussed and the patient wished to proceed.   DETAILS OF PROCEDURE: After informed consent was obtained, the patient was brought in the operative suite at [facility]. The patient was placed in supine position, given a small dose of Alfenta and a retrobulbar block was performed on the right eye by the primary surgeon without any complications. The right eye was prepped and draped in a sterile manner. After lid speculum was inserted, a side-port wound was created at approximately 10:30 through clear cornea. DisCoVisc was injected into the anterior chamber in order to maintain it. A clear corneal wound was created at approximately 12 o'clock in three-plane fashion. The cystotome was introduced and a continuous 360 degree anterior capsulorrhexis was created without complication. The lens was hydrodissected using  BSS on a 26-gauge cannula. The lens was rotated for 180 degrees. The phacoemulsification wand was introduced and the lens was broken into 4 quadrants and removed without complication. The INA was used to remove any remnant cortical material from the capsular bag. DisCoVisc was injected into the capsular bag. A 22-diopter ZCB00 lens from [employer], serial #[id number] was injected into the capsular bag and rotated into position. The DisCoVisc was removed using INA. A single 10-0 nylon stitch was placed at the main corneal wound and the knot was rotated into the cornea. The corneal wounds were hydrated and the anterior chamber maintained itself. Attention was turned to the pars plana vitrectomy portion of the case.   A 25-gauge trocar was placed inferotemporally through displaced conjunctiva in an oblique fashion 3 mm beyond the limbus in the inferotemporal quadrant. The infusion cannula was turned on and inserted through the trocar and secured in position with Steri-Strips. Two more trocars were placed in a similar fashion superotemporally and superonasally. Vitreous cutter and light pipe were introduced in the eye and a core vitrectomy was performed. The vitreous face was engaged using suction and carefully peeled away from the retina. The retinal face was removed and peripheral vitreous was trimmed for 360 degrees. The scleral depressed exam was performed for 360 degrees due to the adherent nature of the gel. No signs of any breaks, tears or retinal detachment could be identified for 360 degrees. Indocyanine green was injected onto the posterior pole and removed within 30 seconds. An internal limiting membrane peel was completed for 360 degrees around the fovea. A scleral depressed exam was performed again  and no signs of any breaks or tears could be identified for 360 degrees. Air-fluid exchange was performed. Four minutes was allowed to elapse for dehydration of the vitreous base and this remnant fluid was removed.  22% SF6 was used as an air-gas exchange and the trocars were removed and were noted to be airtight. Pressure in the eye was confirmed to be approximately 15 mmHg and 5 mg of dexamethasone was given into the inferior fornix. The lid speculum was removed and the eye was cleaned. TobraDex was placed in the eye and a patch and shield were placed over the eye. The patient was taken to postanesthesia care with instructions to remain face down.     ____________________________ Ignacia FellingMatthew F. Champ MungoAppenzeller, MD mfa:ce D: 09/20/2013 10:40:05 ET T: 09/20/2013 10:48:26 ET JOB#: [id number]  cc: Ignacia FellingMatthew F. Champ MungoAppenzeller, MD, <Dictator> Cline CoolsMATTHEW F Gurtaj Ruz MD ELECTRONICALLY SIGNED 10/18/2013 7:08

## 2015-04-17 ENCOUNTER — Encounter: Payer: Self-pay | Admitting: Cardiology

## 2015-04-19 ENCOUNTER — Encounter: Payer: Self-pay | Admitting: Cardiovascular Disease

## 2015-05-14 ENCOUNTER — Encounter: Payer: Self-pay | Admitting: Cardiovascular Disease

## 2015-05-14 ENCOUNTER — Ambulatory Visit (INDEPENDENT_AMBULATORY_CARE_PROVIDER_SITE_OTHER): Payer: Medicare Other | Admitting: Cardiovascular Disease

## 2015-05-14 VITALS — BP 122/72 | HR 84 | Ht 63.0 in | Wt 158.9 lb

## 2015-05-14 DIAGNOSIS — I4891 Unspecified atrial fibrillation: Secondary | ICD-10-CM | POA: Diagnosis not present

## 2015-05-14 DIAGNOSIS — I370 Nonrheumatic pulmonary valve stenosis: Secondary | ICD-10-CM | POA: Diagnosis not present

## 2015-05-14 DIAGNOSIS — Z9889 Other specified postprocedural states: Secondary | ICD-10-CM

## 2015-05-14 DIAGNOSIS — I442 Atrioventricular block, complete: Secondary | ICD-10-CM

## 2015-05-14 DIAGNOSIS — Z950 Presence of cardiac pacemaker: Secondary | ICD-10-CM

## 2015-05-14 DIAGNOSIS — Z8774 Personal history of (corrected) congenital malformations of heart and circulatory system: Secondary | ICD-10-CM

## 2015-05-14 LAB — CUP PACEART INCLINIC DEVICE CHECK
Battery Impedance: 4712 Ohm
Battery Remaining Longevity: 4 mo
Battery Voltage: 2.62 V
Brady Statistic AP VP Percent: 92 %
Brady Statistic AP VS Percent: 1 %
Brady Statistic AS VP Percent: 6 %
Brady Statistic AS VS Percent: 0 %
Date Time Interrogation Session: 20160524125231
Lead Channel Impedance Value: 540 Ohm
Lead Channel Impedance Value: 790 Ohm
Lead Channel Pacing Threshold Amplitude: 0.625 V
Lead Channel Pacing Threshold Amplitude: 1.125 V
Lead Channel Pacing Threshold Pulse Width: 0.4 ms
Lead Channel Pacing Threshold Pulse Width: 0.4 ms
Lead Channel Setting Pacing Amplitude: 2.25 V
Lead Channel Setting Pacing Amplitude: 2.5 V
Lead Channel Setting Pacing Pulse Width: 0.4 ms
Lead Channel Setting Sensing Sensitivity: 4 mV

## 2015-05-14 NOTE — Patient Instructions (Addendum)
Remote monitoring is used to monitor your pacemaker from home. This monitoring reduces the number of office visits required to check your device to one time per year. It allows us to keep an eye on the functioning of your device to ensure it is working properly. You are scheduled for a device check from home on 06/13/2015. You may send your transmission at any time that day. If you have a wireless device, the transmission will be sent automatically. After your physician reviews your transmission, you will receive a postcard with your next transmission date.  Your physician recommends that you schedule a follow-up appointment in: 12 months with Dr.Croitoru + pacemaker check

## 2015-05-14 NOTE — Progress Notes (Signed)
Patient ID: [name] Baird, female   DOB: [birth year], 81 y.o.   MRN: [id number]      Cardiology Office Note   Date:  05/14/2015   ID:  [name] Baird, DOB 01/31/1933, MRN [id number]  PCP:  [name] Bamberger, NP  Cardiologist:   [name] Fair, MD   Chief Complaint  Patient presents with  . Follow-up    6 months:  No complaints of chest pain, SOB, edema or dizziness.      History of Present Illness: [name] Baird is a 81 y.o. female who presents for follow-up for complete heart block/pacemaker dependent with a dual-chamber device that is approaching battery depletion. Interrogation of her pacemaker today shows an estimated generator longevity of about another 4 months (Baird 1-11 months). The device has recorded generally brief episodes of paroxysmal atrial tachycardia (maximum 45 minutes, all of them asymptomatic) with uninterrupted ventricular pacing at 70 bpm her pacemaker is a Medtronic Adapta device implanted in 2007. The presenting rhythm today atrial ventricular sequential pacing. She has 93% atrial pacing and 100% ventricular pacing.  Her weight has been steady and she has not had any manifestations of congestive heart failure. Her home scale shows 157 pounds +/- 1 lb ( office scale is 1-2 pounds more than that).  She had a flutter ablation performed by Dr. Johney Baird in July 2014 and is not taking any antiarrhythmics at this time, other than a low dose of beta blocker. At the time of the EP study she was noted to have conventional counterclockwise isthmus-dependent right atrial flutter but then a second type of right atrial flutter was noted briefly. It could not be reinduced for full study.  She has complete heart block and is pacemaker dependent (Medtronic Adapta). Her pacemaker generator is expected to reach ERI in late 2016.  Her pacemaker has had issues with far field R wave oversensing as well as crosstalk, especially when atrial overdrive pacing was attempted. She has a history of  pulmonic stenosis undergoing pulmonary valvotomy in 1985 and repair of an atrial septal defect at the same time. Her echocardiogram shows mild to moderate aortic insufficiency, mild pulmonic insufficiency and mild mitral insufficiency. She has mildly reduced left ventricular systolic function, largely related to ventricular dyssynchrony from right ventricular pacing.  Past Medical History  Diagnosis Date  . Hypertension   . Atrial flutter     s/p ablation 06/29/2013 by Dr [name] Baird  . Complete heart block 2007    s/p medtronic pacemaker implantation  . Depression   . Anxiety   . CHF (congestive heart failure)   . Glaucoma   . Status post patch closure of ASD   . Pulmonary stenosis sp valvotomy        . Shortness of breath     Past Surgical History  Procedure Laterality Date  . Aortic valve repair    . Pulmonary valvotomy  1985  . Trigeminal sx      left side  . Cardioversion N/A 03/24/2013    Procedure: CARDIOVERSION;  Surgeon: [name] Fair, MD;  Location: [facility];  Service: Cardiovascular;  Laterality: N/A;  . Pacemaker insertion  2007    Medtronic Adapta ADDRL1, serial # [id number]  . Ablation  06/29/2013    s/p isthmus ablation for atrial flutter by Dr [name] Baird  . Atrial flutter ablation N/A 06/29/2013    Procedure: ATRIAL FLUTTER ABLATION;  Surgeon: [name] Range, MD;  Location: [facility] CATH LAB;  Service: Cardiovascular;  Laterality: N/A;     Current Outpatient Prescriptions  Medication Sig Dispense Refill  . acetaminophen (TYLENOL ARTHRITIS PAIN) 650 MG CR tablet Take 1,300 mg by mouth daily as needed for pain.     . brimonidine (ALPHAGAN P) 0.1 % SOLN Apply 1 drop to eye daily.    [name] Kitchen. BYSTOLIC 10 MG tablet TAKE 1 TABLET DAILY 90 tablet 3  . calcium-vitamin D (OSCAL) 250-125 MG-UNIT per tablet Take 1 tablet by mouth daily.    . clonazePAM (KLONOPIN) 0.5 MG tablet Take 0.5 mg by mouth daily as needed.     . dorzolamide-timolol (COSOPT) 22.3-6.8 MG/ML ophthalmic solution Place 2  drops into both eyes 2 (two) times daily.    . furosemide (LASIX) 40 MG tablet TAKE 1 TABLET DAILY. MAY TAKE EXTRA DOSE AS NEEDED FOR WEIGHT GAIN DAILY. 180 tablet 1  . latanoprost (XALATAN) 0.005 % ophthalmic solution Place 1 drop into both eyes at bedtime.    [name] Kitchen. losartan (COZAAR) 50 MG tablet Take 1 tablet (50 mg total) by mouth daily. 90 tablet 2  . Multiple Vitamins-Minerals (MULTIVITAMIN PO) Take 1 tablet by mouth daily.     . pilocarpine (PILOCAR) 1 % ophthalmic solution Place 4 drops into the left eye daily.     . potassium chloride (KLOR-CON M10) 10 MEQ tablet Take 2 tablets (20 mEq total) by mouth daily. 180 tablet 2  . rosuvastatin (CRESTOR) 10 MG tablet Take 10 mg by mouth at bedtime.    [name] Kitchen. venlafaxine (EFFEXOR) 75 MG tablet Take 75 mg by mouth daily.     [name] Baird. XARELTO 20 MG TABS tablet TAKE 1 TABLET (20 MG TOTAL) BY MOUTH DAILY WITH SUPPER. 30 tablet 6   No current facility-administered medications for this visit.    Allergies:   Sulfa antibiotics and Hydrocodone    Social History:  The patient  reports that she has never smoked. She has never used smokeless tobacco. She reports that she does not drink alcohol or use illicit drugs.     ROS:  Please see the history of present illness.    Otherwise, review of systems positive for none.   All other systems are reviewed and negative.    PHYSICAL EXAM: VS:  BP 122/72 mmHg  Pulse 84  Ht 5\' 3"  (1.6 m)  Wt 72.077 kg (158 lb 14.4 oz)  BMI 28.16 kg/m2 , BMI Body mass index is 28.16 kg/(m^2).  General: Alert, oriented x3, no distress Head: no evidence of trauma, PERRL, EOMI, no exophtalmos or lid lag, no myxedema, no xanthelasma; normal ears, nose and oropharynx Neck: normal jugular venous pulsations and no hepatojugular reflux; brisk carotid pulses without delay and no carotid bruits Chest: clear to auscultation, no signs of consolidation by percussion or palpation, normal fremitus, symmetrical and full respiratory  excursions Cardiovascular: normal position and quality of the apical impulse, regular rhythm, normal first and paradoxically split second heart sounds, no rubs or gallops, or to/6 decrescendo diastolic murmur at the right and left lower sternal border Abdomen: no tenderness or distention, no masses by palpation, no abnormal pulsatility or arterial bruits, normal bowel sounds, no hepatosplenomegaly Extremities: no clubbing, cyanosis or edema; 2+ radial, ulnar and brachial pulses bilaterally; 2+ right femoral, posterior tibial and dorsalis pedis pulses; 2+ left femoral, posterior tibial and dorsalis pedis pulses; no subclavian or femoral bruits Neurological: grossly nonfocal Psych: euthymic mood, full affect   EKG:  EKG is ordered today. The ekg ordered today demonstrates atrial paced, ventricular paced rhythm   Recent Labs: No results found for requested labs within last  365 days.    Lipid Panel No results found for: CHOL, TRIG, HDL, CHOLHDL, VLDL, LDLCALC, LDLDIRECT    Wt Readings from Last 3 Encounters:  05/14/15 72.077 kg (158 lb 14.4 oz)  11/08/14 71.986 kg (158 lb 11.2 oz)  04/24/14 70.081 kg (154 lb 8 oz)       ASSESSMENT AND PLAN:  Little has complete heart block and is pacemaker dependent. It is anticipated that she will require generator change out sometime this summer. We discussed whether or not a temporary transvenous pacing wire is the best option at the time of the procedure. The risk of bleeding has to be taken in consideration since she is on chronic Xarelto. On the other hand she has no escape rhythm whatsoever. She has not had recent episodes of atrial fibrillation, but some rare events were recorded after she had the flutter ablation.   Current medicines are reviewed at length with the patient today.  The patient does not have concerns regarding medicines.  The following changes have been made:  no change  Labs/ tests ordered today include:  Orders Placed This  Encounter  Procedures  . Implantable device check  . EKG 12-Lead    Patient Instructions  Remote monitoring is used to monitor your pacemaker from home. This monitoring reduces the number of office visits required to check your device to one time per year. It allows [place] to keep an eye on the functioning of your device to ensure it is working properly. You are scheduled for a device check from home on 06/13/2015. You may send your transmission at any time that day. If you have a wireless device, the transmission will be sent automatically. After your physician reviews your transmission, you will receive a postcard with your next transmission date.  Your physician recommends that you schedule a follow-up appointment in: 12 months with Dr.Saarah Dewing + pacemaker check     Signed, [name] Fair, MD  05/14/2015 7:05 PM    [name] Fair, MD, [facility] [phone] office [phone] pager

## 2015-06-04 ENCOUNTER — Encounter: Payer: Self-pay | Admitting: Cardiovascular Disease

## 2015-06-13 ENCOUNTER — Ambulatory Visit (INDEPENDENT_AMBULATORY_CARE_PROVIDER_SITE_OTHER): Payer: Medicare Other | Admitting: *Deleted

## 2015-06-13 DIAGNOSIS — Z950 Presence of cardiac pacemaker: Secondary | ICD-10-CM

## 2015-06-13 LAB — CUP PACEART REMOTE DEVICE CHECK
Battery Impedance: 5377 Ohm
Battery Remaining Longevity: 2 mo
Battery Voltage: 2.59 V
Brady Statistic AP VP Percent: 92 %
Brady Statistic AP VS Percent: 1 %
Brady Statistic AS VP Percent: 6 %
Brady Statistic AS VS Percent: 0 %
Date Time Interrogation Session: 20160623125424
Lead Channel Impedance Value: 541 Ohm
Lead Channel Impedance Value: 796 Ohm
Lead Channel Setting Pacing Amplitude: 2.5 V
Lead Channel Setting Pacing Amplitude: 2.5 V
Lead Channel Setting Pacing Pulse Width: 0.4 ms
Lead Channel Setting Sensing Sensitivity: 4 mV

## 2015-06-13 NOTE — Progress Notes (Signed)
Remote pacemaker transmission.   

## 2015-06-20 ENCOUNTER — Encounter: Payer: Self-pay | Admitting: Cardiology

## 2015-06-21 ENCOUNTER — Telehealth: Payer: Self-pay | Admitting: Cardiovascular Disease

## 2015-06-21 NOTE — Telephone Encounter (Signed)
Pt says she diid not feel right,where pacemaker is,it felt like a tightness. She did a download last night,wants to see if it is all right.

## 2015-06-21 NOTE — Telephone Encounter (Signed)
Follow Up   4. Are you calling to see if we received your device transmission? Y  Comments: pt daughter called to follow

## 2015-06-21 NOTE — Telephone Encounter (Signed)
Pt's device reached ERI 06/17/15. Pt has been feeling tight & bloated in middle of chest since ERI. Device did revert to VVI 65 from DDDR 70. Her device is not reprogrammable.   Due to no available time slot through July for Dr. Royann Shiversroitoru or Joice LoftsAmber, msg sent to Baldomero LamyKay Fogleman to direct schedule pt in lab for changeout. Pt's daughter aware someone from [facility] will contact them to schedule.   *Daughter, Ms. Lovings, requesting to remind staff pt may need to have temp pacer inserted during changeout due to pacer dependency.

## 2015-06-21 NOTE — Telephone Encounter (Signed)
Pt's daughter is calling back to speak with someone in the device clinic.

## 2015-06-25 ENCOUNTER — Encounter: Payer: Self-pay | Admitting: Cardiovascular Disease

## 2015-06-25 ENCOUNTER — Telehealth: Payer: Self-pay | Admitting: *Deleted

## 2015-06-25 ENCOUNTER — Other Ambulatory Visit: Payer: Self-pay | Admitting: *Deleted

## 2015-06-25 DIAGNOSIS — Z45010 Encounter for checking and testing of cardiac pacemaker pulse generator [battery]: Secondary | ICD-10-CM

## 2015-06-25 DIAGNOSIS — Z79899 Other long term (current) drug therapy: Secondary | ICD-10-CM

## 2015-06-25 NOTE — Telephone Encounter (Signed)
Patient notified she is scheduled for pacemaker generator change out Thursday 06/27/15, lab work needs to done tomorrow 06/26/15.  Daughter will come by the office today to pick up her paper work.  Patient voiced understanding.

## 2015-06-25 NOTE — Telephone Encounter (Signed)
Patient notified she is scheduled for pacemaker generator change out Thursday 06/27/15, lab work needs to done tomorrow 06/26/15.  Daughter will come by the office today to pick up her paper work.  Patient voiced understanding. 

## 2015-06-26 ENCOUNTER — Encounter: Payer: Self-pay | Admitting: Cardiovascular Disease

## 2015-06-26 ENCOUNTER — Ambulatory Visit: Payer: Medicare Other | Admitting: Cardiovascular Disease

## 2015-06-26 LAB — COMPREHENSIVE METABOLIC PANEL
ALT: 16 U/L (ref 0–35)
AST: 19 U/L (ref 0–37)
Albumin: 4.2 g/dL (ref 3.5–5.2)
Alkaline Phosphatase: 72 U/L (ref 39–117)
BUN: 18 mg/dL (ref 6–23)
CO2: 26 mEq/L (ref 19–32)
Calcium: 9.9 mg/dL (ref 8.4–10.5)
Chloride: 104 mEq/L (ref 96–112)
Creat: 1.03 mg/dL (ref 0.50–1.10)
Glucose, Bld: 101 mg/dL — ABNORMAL HIGH (ref 70–99)
Potassium: 4.1 mEq/L (ref 3.5–5.3)
Sodium: 142 mEq/L (ref 135–145)
Total Bilirubin: 0.8 mg/dL (ref 0.2–1.2)
Total Protein: 7 g/dL (ref 6.0–8.3)

## 2015-06-26 LAB — CBC
HCT: 42.4 % (ref 36.0–46.0)
Hemoglobin: 13.9 g/dL (ref 12.0–15.0)
MCH: 31 pg (ref 26.0–34.0)
MCHC: 32.8 g/dL (ref 30.0–36.0)
MCV: 94.4 fL (ref 78.0–100.0)
MPV: 9.8 fL (ref 8.6–12.4)
Platelets: 199 10*3/uL (ref 150–400)
RBC: 4.49 MIL/uL (ref 3.87–5.11)
RDW: 13.5 % (ref 11.5–15.5)
WBC: 7.7 10*3/uL (ref 4.0–10.5)

## 2015-06-26 LAB — PROTIME-INR
INR: 1.53 — ABNORMAL HIGH (ref <1.50)
Prothrombin Time: 18.4 seconds — ABNORMAL HIGH (ref 11.6–15.2)

## 2015-06-26 LAB — APTT: aPTT: 35 seconds (ref 24–37)

## 2015-06-26 MED ORDER — SODIUM CHLORIDE 0.9 % IR SOLN
80.0000 mg | Status: AC
  Filled 2015-06-26: qty 2

## 2015-06-26 MED ORDER — CEFAZOLIN SODIUM-DEXTROSE 2-3 GM-% IV SOLR: 2.0000 g | INTRAVENOUS | Status: DC

## 2015-06-27 ENCOUNTER — Encounter (HOSPITAL_COMMUNITY)
Admission: RE | Disposition: A | Discharge status: Home / Self Care | Payer: Medicare Other | Source: Ambulatory Visit | Attending: Cardiovascular Disease

## 2015-06-27 ENCOUNTER — Ambulatory Visit (HOSPITAL_COMMUNITY)
Admission: RE | Admit: 2015-06-27 | Discharge: 2015-06-27 | Disposition: A | Discharge status: Home / Self Care | Payer: Medicare Other | Source: Ambulatory Visit | Attending: Cardiovascular Disease | Admitting: Cardiovascular Disease

## 2015-06-27 DIAGNOSIS — F419 Anxiety disorder, unspecified: Secondary | ICD-10-CM | POA: Diagnosis not present

## 2015-06-27 DIAGNOSIS — I442 Atrioventricular block, complete: Secondary | ICD-10-CM | POA: Diagnosis not present

## 2015-06-27 DIAGNOSIS — I10 Essential (primary) hypertension: Secondary | ICD-10-CM | POA: Diagnosis not present

## 2015-06-27 DIAGNOSIS — I4892 Unspecified atrial flutter: Secondary | ICD-10-CM | POA: Insufficient documentation

## 2015-06-27 DIAGNOSIS — Z45010 Encounter for checking and testing of cardiac pacemaker pulse generator [battery]: Secondary | ICD-10-CM

## 2015-06-27 DIAGNOSIS — F329 Major depressive disorder, single episode, unspecified: Secondary | ICD-10-CM | POA: Insufficient documentation

## 2015-06-27 DIAGNOSIS — I509 Heart failure, unspecified: Secondary | ICD-10-CM | POA: Diagnosis not present

## 2015-06-27 HISTORY — PX: CARDIAC CATHETERIZATION: SHX172

## 2015-06-27 HISTORY — PX: EP IMPLANTABLE DEVICE: SHX172B

## 2015-06-27 LAB — SURGICAL PCR SCREEN
MRSA, PCR: NEGATIVE
Staphylococcus aureus: NEGATIVE

## 2015-06-27 SURGERY — PPM/BIV PPM GENERATOR CHANGEOUT
Anesthesia: LOCAL

## 2015-06-27 MED ORDER — SODIUM CHLORIDE 0.9 % IV SOLN: INTRAVENOUS | Status: DC

## 2015-06-27 MED ORDER — MIDAZOLAM HCL 5 MG/5ML IJ SOLN
INTRAMUSCULAR | Status: DC | PRN
  Administered 2015-06-27: 1 mg via INTRAVENOUS

## 2015-06-27 MED ORDER — ONDANSETRON HCL 4 MG/2ML IJ SOLN: 4.0000 mg | Freq: Four times a day (QID) | INTRAMUSCULAR | Status: DC | PRN

## 2015-06-27 MED ORDER — MIDAZOLAM HCL 5 MG/5ML IJ SOLN
INTRAMUSCULAR | Status: AC
  Filled 2015-06-27: qty 5

## 2015-06-27 MED ORDER — LIDOCAINE HCL (PF) 1 % IJ SOLN
INTRAMUSCULAR | Status: AC
  Filled 2015-06-27: qty 60

## 2015-06-27 MED ORDER — MUPIROCIN 2 % EX OINT
1.0000 "application " | TOPICAL_OINTMENT | Freq: Once | CUTANEOUS | Status: AC
  Administered 2015-06-27: 1 via TOPICAL
  Filled 2015-06-27: qty 22

## 2015-06-27 MED ORDER — GENTAMICIN SULFATE 40 MG/ML IJ SOLN
INTRAMUSCULAR | Status: AC
  Filled 2015-06-27: qty 2

## 2015-06-27 MED ORDER — HEPARIN (PORCINE) IN NACL 2-0.9 UNIT/ML-% IJ SOLN
INTRAMUSCULAR | Status: AC
  Filled 2015-06-27: qty 500

## 2015-06-27 MED ORDER — ACETAMINOPHEN 325 MG PO TABS: 325.0000 mg | ORAL_TABLET | ORAL | Status: DC | PRN

## 2015-06-27 MED ORDER — SODIUM CHLORIDE 0.9 % IV SOLN
INTRAVENOUS | Status: DC
  Administered 2015-06-27: 13:00:00 via INTRAVENOUS

## 2015-06-27 MED ORDER — CEFAZOLIN SODIUM-DEXTROSE 2-3 GM-% IV SOLR
INTRAVENOUS | Status: AC
  Filled 2015-06-27: qty 50

## 2015-06-27 MED ORDER — SODIUM CHLORIDE 0.9 % IJ SOLN: 3.0000 mL | INTRAMUSCULAR | Status: DC | PRN

## 2015-06-27 MED ORDER — RIVAROXABAN 20 MG PO TABS: 20.0000 mg | ORAL_TABLET | Freq: Every day | ORAL | Status: DC

## 2015-06-27 MED ORDER — CEFAZOLIN SODIUM-DEXTROSE 2-3 GM-% IV SOLR
INTRAVENOUS | Status: DC | PRN
  Administered 2015-06-27: 2 g via INTRAVENOUS

## 2015-06-27 MED ORDER — MUPIROCIN 2 % EX OINT
TOPICAL_OINTMENT | CUTANEOUS | Status: AC
  Administered 2015-06-27: 1 via TOPICAL
  Filled 2015-06-27: qty 22

## 2015-06-27 SURGICAL SUPPLY — 12 items
CABLE SURGICAL S-101-97-12 (CABLE) ×1 IMPLANT
CATH S G BIP PACING (SET/KITS/TRAYS/PACK) ×1 IMPLANT
NDL 18GX3-1/2 9CM (NEEDLE) IMPLANT
NDL PERC 18GX7CM (NEEDLE) IMPLANT
NEEDLE 18GX3-1/2 9CM (NEEDLE) ×2 IMPLANT
NEEDLE PERC 18GX7CM (NEEDLE) ×2 IMPLANT
PACEMAKER ADAPTA DR ADDRL1 (Pacemaker) IMPLANT
PAD DEFIB LIFELINK (PAD) ×1 IMPLANT
PPM ADAPTA DR ADDRL1 (Pacemaker) ×2 IMPLANT
SHEATH PINNACLE 6F 10CM (SHEATH) ×1 IMPLANT
SYR 8ML ANGIOGRAPH HI-PRES (SYRINGE) ×1 IMPLANT
TRAY PACEMAKER INSERTION (CUSTOM PROCEDURE TRAY) ×1 IMPLANT

## 2015-06-27 NOTE — CV Procedure (Addendum)
error 

## 2015-06-27 NOTE — Op Note (Addendum)
Error

## 2015-06-27 NOTE — H&P (Signed)
Cardiology Office Note   Date:  06/27/2015   ID:  [name] Baird, DOB April 11, 1933, MRN [id number]  PCP:  Tomi BambergerFULLER,SUSAN, NP  Cardiologist:   Thurmon FairROITORU,Brisia Schuermann, MD   PACEMAKER AT ELECTIVE REPLACEMENT INDICATOR    History of Present Illness: [name] CurtMona S Baird is a 81 y.o. female who presents for PACEMAKER GENERATOR CHANGE. She has complete heart block/pacemaker dependent with a dual-chamber device that has reached ERI with reversion to VVI and symptoms of pacemaker syndrome.  The device has recorded generally brief episodes of paroxysmal atrial tachycardia (maximum 45 minutes, all of them asymptomatic) with uninterrupted ventricular pacing at 70 bpm her pacemaker is a Medtronic Adapta device implanted in 2007. The presenting rhythm today atrial ventricular sequential pacing. She has previously had 93% atrial pacing and 100% ventricular pacing.  Her weight has been steady and she has not had any manifestations of congestive heart failure. Her home scale shows 157 pounds +/- 1 lb.  She had a flutter ablation performed by Dr. Johney FrameAllred in July 2014 and is not taking any antiarrhythmics at this time, other than a low dose of beta blocker. At the time of the EP study she was noted to have conventional counterclockwise isthmus-dependent right atrial flutter but then a second type of right atrial flutter was noted briefly. It could not be reinduced for full study.   Her pacemaker has had issues with far field R wave oversensing as well as crosstalk, especially when atrial overdrive pacing was attempted.  She has a history of pulmonic stenosis undergoing pulmonary valvotomy in 1985 and repair of an atrial septal defect at the same time. Her echocardiogram shows mild to moderate aortic insufficiency, mild pulmonic insufficiency and mild mitral insufficiency. She has mildly reduced left ventricular systolic function, largely related to ventricular dyssynchrony from right ventricular pacing.    Past Medical  History  Diagnosis Date  . Hypertension   . Atrial flutter     s/p ablation 06/29/2013 by Dr Johney FrameAllred  . Complete heart block 2007    s/p medtronic pacemaker implantation  . Depression   . Anxiety   . CHF (congestive heart failure)   . Glaucoma   . Status post patch closure of ASD   . Pulmonary stenosis sp valvotomy        . Shortness of breath     Past Surgical History  Procedure Laterality Date  . Aortic valve repair    . Pulmonary valvotomy  1985  . Trigeminal sx      left side  . Cardioversion N/A 03/24/2013    Procedure: CARDIOVERSION;  Surgeon: Thurmon FairMihai Grier Czerwinski, MD;  Location: [facility];  Service: Cardiovascular;  Laterality: N/A;  . Pacemaker insertion  2007    Medtronic Adapta ADDRL1, serial # [id number]  . Ablation  06/29/2013    s/p isthmus ablation for atrial flutter by Dr Johney FrameAllred  . Atrial flutter ablation N/A 06/29/2013    Procedure: ATRIAL FLUTTER ABLATION;  Surgeon: Hillis RangeJames Allred, MD;  [facility];  Service: Cardiovascular;  Laterality: N/A;     Current Facility-Administered Medications  Medication Dose Route Frequency Provider Last Rate Last Dose  . 0.9 %  sodium chloride infusion   Intravenous Continuous Jennifer Holland, MD      . ceFAZolin (ANCEF) IVPB 2 g/50 mL premix  2 g Intravenous On Call Ngoc Daughtridge, MD      . gentamicin (GARAMYCIN) 80 mg in sodium chloride irrigation 0.9 % 500 mL irrigation  80 mg Irrigation On Call [employer] of CompaniesHaley  Alyson Locket, RPH      . mupirocin ointment (BACTROBAN) 2 % 1 application  1 application Topical Once Kynnedi Zweig, MD      . sodium chloride 0.9 % injection 3 mL  3 mL Intravenous PRN Thurmon Fair, MD        Allergies:   Sulfa antibiotics and Hydrocodone    Social History:  The patient  reports that she has never smoked. She has never used smokeless tobacco. She reports that she does not drink alcohol or use illicit drugs.   Family History:  The patient's reviewed family history is not contributory    ROS:  Please see  the history of present illness.    Otherwise, review of systems positive for fatigue, weakness nausea after VVI mode switch   All other systems are reviewed and negative.    PHYSICAL EXAM: VS:  BP 139/55 mmHg  Pulse 64  Temp(Src) 98.4 F (36.9 C) (Oral)  Resp 18  Ht  (1.575 m)  Wt 161 lb (73.029 kg)  BMI 29.44 kg/m2  SpO2 100% ,  General: Alert, oriented x3, no distress Head: no evidence of trauma, PERRL, EOMI, no exophtalmos or lid lag, no myxedema, no xanthelasma; normal ears, nose and oropharynx Neck: normal jugular venous pulsations and no hepatojugular reflux; brisk carotid pulses without delay and no carotid bruits Chest: clear to auscultation, no signs of consolidation by percussion or palpation, normal fremitus, symmetrical and full respiratory excursions Cardiovascular: normal position and quality of the apical impulse, regular rhythm, normal first and paradoxically split second heart sounds, no rubs or gallops, 2/6 decrescendo diastolic murmur at the right and left lower sternal border Abdomen: no tenderness or distention, no masses by palpation, no abnormal pulsatility or arterial bruits, normal bowel sounds, no hepatosplenomegaly Extremities: no clubbing, cyanosis or edema; 2+ radial, ulnar and brachial pulses bilaterally; 2+ right femoral, posterior tibial and dorsalis pedis pulses; 2+ left femoral, posterior tibial and dorsalis pedis pulses; no subclavian or femoral bruits Neurological: grossly nonfocal Psych: euthymic mood, full affect   Wt Readings from Last 3 Encounters:  05/14/15 158 lb 14.4 oz (72.077 kg)  11/08/14 158 lb 11.2 oz (71.986 kg)  04/24/14 154 lb 8 oz (70.081 kg)       ASSESSMENT AND PLAN:  Plan pacemaker generator change with temporary transvenous pacing. This procedure has been fully reviewed with the patient and written informed consent has been obtained.   Labs/ tests ordered today include:  Orders Placed This Encounter  Procedures  .  Surgical pcr screen  . Diet NPO time specified Except for: Sips with Meds  . Obtain Consent  . Electrode Placement  . Use clippers to remove hair, entire chest area  . Verify informed consent  . Void on call to EP Lab  . Lab instructions  . EP PPM/ICD IMPLANT   Signed, Thurmon Fair, MD  06/27/2015 12:13 PM    Thurmon Fair, MD, [facility] [phone] office [phone] pager

## 2015-06-27 NOTE — Op Note (Signed)
Procedure report  Procedure performed:  1. Dual chamber pacemaker generator changeout  2. Light sedation  3. Temporary transvenous pacemaker lead Reason for procedure:  1. Device generator at elective replacement interval  2. Complete heart block 3. Pacemaker syndrome Procedure performed by:  Thurmon FairMihai Cesia Orf, MD  Complications:  None  Estimated blood loss:  <5 mL  Medications administered during procedure:  Ancef 2 g intravenously, lidocaine 1% 30 mL locally, Versed 1 mg intravenously Device details:   New Generator Medtronic Adapta L model number ADDRL1, serial number  Right atrial lead (chronic) Medtronic [id number], serial number [id number] (implanted 09/09/2006) Right ventricular lead (chronic)  Medtronic [id number], serial number [id number] (implanted 09/09/2006)  Explanted generator Medtronic Adapta (implanted 09/09/2006)  Procedure details:  After the risks and benefits of the procedure were discussed the patient provided informed consent. She was brought to the cardiac catheter lab in the fasting state. The patient was prepped and draped in usual sterile fashion. Local anesthesia with 1% lidocaine was administered to to the right infraclavicular area. A 5-6cm horizontal incision was made parallel with and 2-3 cm caudal to the right clavicle, in the area of an old scar. An older scar was seen closer to the right clavicle. Using minimal electrocautery and mostly sharp and blunt dissection the prepectoral pocket was opened carefully to avoid injury to the loops of chronic leads. Extensive dissection was not necessary. The device was explanted. The pocket was carefully inspected for hemostasis and flushed with copious amounts of antibiotic solution.  The leads were disconnected from the old generator and testing of the lead parameters later showed excellent values. The new generator was connected to the chronic leads, with appropriate pacing noted.   The entire system was then carefully inserted  in the pocket with care been taking that the leads and device assumed a comfortable position without pressure on the incision. Great care was taken that the leads be located deep to the generator. The pocket was then closed in layers using 2 layers of 2-0 Vicryl and cutaneous staples after which a sterile dressing was applied.   At the end of the procedure the following lead parameters were encountered:   Right atrial lead sensed P waves 2.5 mV, impedance 477 ohms, threshold 1.0 at 0.5 ms pulse width.  Right ventricular lead sensed R waves  20 mV (temp pacer), impedance 674 ohms, threshold 0.6 at 0.5 ms pulse width.   Thurmon FairMihai Glinda Natzke, MD, [facility] [phone] office [phone] pager

## 2015-06-27 NOTE — Discharge Instructions (Signed)

## 2015-06-28 ENCOUNTER — Encounter (HOSPITAL_COMMUNITY): Payer: Self-pay | Admitting: Cardiovascular Disease

## 2015-06-28 MED FILL — Heparin Sodium (Porcine) 2 Unit/ML in Sodium Chloride 0.9%: INTRAMUSCULAR | Qty: 500 | Status: AC

## 2015-06-28 MED FILL — Lidocaine HCl Local Preservative Free (PF) Inj 1%: INTRAMUSCULAR | Qty: 30 | Status: AC

## 2015-06-28 MED FILL — Sodium Chloride Irrigation Soln 0.9%: Qty: 500 | Status: AC

## 2015-06-28 MED FILL — Gentamicin Sulfate Inj 40 MG/ML: INTRAMUSCULAR | Qty: 2 | Status: AC

## 2015-07-02 ENCOUNTER — Telehealth: Payer: Self-pay | Admitting: Cardiovascular Disease

## 2015-07-02 NOTE — Telephone Encounter (Signed)
Closed encounter °

## 2015-07-08 ENCOUNTER — Ambulatory Visit: Payer: Medicare Other

## 2015-07-08 ENCOUNTER — Ambulatory Visit (INDEPENDENT_AMBULATORY_CARE_PROVIDER_SITE_OTHER): Payer: Medicare Other | Admitting: *Deleted

## 2015-07-08 DIAGNOSIS — Z950 Presence of cardiac pacemaker: Secondary | ICD-10-CM

## 2015-07-08 DIAGNOSIS — I442 Atrioventricular block, complete: Secondary | ICD-10-CM | POA: Diagnosis not present

## 2015-07-08 LAB — CUP PACEART INCLINIC DEVICE CHECK
Battery Impedance: 100 Ohm
Battery Remaining Longevity: 128 mo
Battery Voltage: 2.79 V
Brady Statistic AP VP Percent: 88 %
Brady Statistic AP VS Percent: 1 %
Brady Statistic AS VP Percent: 11 %
Brady Statistic AS VS Percent: 0 %
Date Time Interrogation Session: 20160718114559
Lead Channel Impedance Value: 503 Ohm
Lead Channel Impedance Value: 783 Ohm
Lead Channel Pacing Threshold Amplitude: 0.75 V
Lead Channel Pacing Threshold Amplitude: 1 V
Lead Channel Pacing Threshold Pulse Width: 0.4 ms
Lead Channel Pacing Threshold Pulse Width: 0.4 ms
Lead Channel Sensing Intrinsic Amplitude: 1.4 mV
Lead Channel Setting Pacing Amplitude: 2 V
Lead Channel Setting Pacing Amplitude: 2.5 V
Lead Channel Setting Pacing Pulse Width: 0.4 ms
Lead Channel Setting Sensing Sensitivity: 5.6 mV

## 2015-07-08 NOTE — Progress Notes (Signed)
Changeout wound check appointment. Staples removed. Wound without redness or edema. Incision edges approximated, wound well healed. Normal device function. Thresholds, sensing, and impedances consistent with implant measurements. Device programmed at chronic output settings. Histogram distribution poor but appropriate for patient and level of activity---pt minimally active; no programming changes made to sensors. 1 mode switch--- <0.1% + xarelto. No high ventricular rates noted. Patient educated about wound care, arm mobility, lifting restrictions. Carelink 10/07/15 & ROV w/ [facility] in 87mo.

## 2015-07-26 ENCOUNTER — Other Ambulatory Visit: Payer: Self-pay | Admitting: Cardiovascular Disease

## 2015-07-26 NOTE — Telephone Encounter (Signed)
Rx(s) sent to pharmacy electronically.  

## 2015-07-29 ENCOUNTER — Other Ambulatory Visit: Payer: Self-pay | Admitting: Cardiovascular Disease

## 2015-07-31 ENCOUNTER — Telehealth: Payer: Self-pay | Admitting: *Deleted

## 2015-07-31 MED ORDER — RIVAROXABAN 20 MG PO TABS: 20.0000 mg | ORAL_TABLET | Freq: Every day | ORAL | Status: DC

## 2015-07-31 NOTE — Telephone Encounter (Signed)
Xarelto PA done and approved from 07/01/15 through 07/30/2016 #[id number] Refill sent to pharmacy.

## 2015-08-01 ENCOUNTER — Other Ambulatory Visit: Payer: Self-pay | Admitting: *Deleted

## 2015-08-06 ENCOUNTER — Encounter: Payer: Self-pay | Admitting: Cardiovascular Disease

## 2015-09-13 ENCOUNTER — Telehealth: Payer: Self-pay | Admitting: *Deleted

## 2015-09-13 NOTE — Telephone Encounter (Signed)
[facility] - need to know if she has been on a beta blocker in the past.

## 2015-09-16 NOTE — Telephone Encounter (Signed)
Per [name] (dtr) she was on Metoprolol years ago and it was not effective and made her feel fatigued.  Will contact [facility] to see if we can get a PA.  If not, Dr. Salena Saner said to try carvedilol 6.25mg  bid.

## 2015-09-16 NOTE — Telephone Encounter (Signed)
Returning your call,if after 4:45 call [phone].

## 2015-09-17 MED ORDER — CARVEDILOL 12.5 MG PO TABS: 12.5000 mg | ORAL_TABLET | Freq: Two times a day (BID) | ORAL | Status: DC

## 2015-09-17 NOTE — Telephone Encounter (Signed)
Notified PA for Bystolic denied.  Per Dr. Salena Saner switch to carvedilol 12.5mg  bid.  Rx sent to pharmacy to hold until she uses up her current Rx of Bystolic.

## 2015-09-17 NOTE — Telephone Encounter (Signed)
PA done through CoverMyMeds.  Awaiting response.

## 2015-10-07 ENCOUNTER — Ambulatory Visit (INDEPENDENT_AMBULATORY_CARE_PROVIDER_SITE_OTHER): Payer: Medicare Other | Admitting: *Deleted

## 2015-10-07 ENCOUNTER — Telehealth: Payer: Self-pay | Admitting: Cardiology

## 2015-10-07 DIAGNOSIS — I442 Atrioventricular block, complete: Secondary | ICD-10-CM

## 2015-10-07 NOTE — Telephone Encounter (Signed)
LMOV[facility] reminding pt to send remote transmission.   

## 2015-10-08 ENCOUNTER — Encounter: Payer: Self-pay | Admitting: Cardiology

## 2015-10-08 LAB — CUP PACEART REMOTE DEVICE CHECK
Battery Impedance: 100 Ohm
Battery Remaining Longevity: 137 mo
Battery Voltage: 2.79 V
Brady Statistic AP VP Percent: 94 %
Brady Statistic AP VS Percent: 1 %
Brady Statistic AS VP Percent: 5 %
Brady Statistic AS VS Percent: 0 %
Date Time Interrogation Session: 20161017211451
Implantable Lead Implant Date: 20070920
Implantable Lead Implant Date: 20070920
Implantable Lead Location: 753859
Implantable Lead Location: 753860
Implantable Lead Model: 4592
Implantable Lead Model: 5092
Lead Channel Impedance Value: 510 Ohm
Lead Channel Impedance Value: 828 Ohm
Lead Channel Pacing Threshold Amplitude: 0.625 V
Lead Channel Pacing Threshold Amplitude: 1.125 V
Lead Channel Pacing Threshold Pulse Width: 0.4 ms
Lead Channel Pacing Threshold Pulse Width: 0.4 ms
Lead Channel Setting Pacing Amplitude: 2 V
Lead Channel Setting Pacing Amplitude: 2.25 V
Lead Channel Setting Pacing Pulse Width: 0.4 ms
Lead Channel Setting Sensing Sensitivity: 5.6 mV

## 2015-10-08 NOTE — Progress Notes (Signed)
Remote pacemaker transmission.   

## 2015-10-22 ENCOUNTER — Other Ambulatory Visit: Payer: Self-pay | Admitting: Cardiovascular Disease

## 2015-10-24 ENCOUNTER — Other Ambulatory Visit: Payer: Self-pay | Admitting: Cardiovascular Disease

## 2015-11-11 ENCOUNTER — Telehealth: Payer: Self-pay | Admitting: Pediatrics

## 2015-11-11 NOTE — Telephone Encounter (Signed)
Patient's daughter, [name], called because the patient is having occasional episodes where she feels lightheaded and dizzy for a short time.  [name] correlates this with the patient taking latanoprost eye drops every night because within 45 minutes after taking the episode occurs.  Patient has had these episodes twice last week and again last night.  Patient has been taking latanoprost "for years".  Reviewed manual Carelink transmission--no abnormalities or alerts.  Advised [name] to start recording patient's blood pressure during these episodes and that I would make Dr. Royann Shiversroitoru aware.  If Dr. Royann Shiversroitoru has any additional recommendations, I will call her back.  [name] is agreeable to this plan and is aware to call if patient has low BPs, worsening symptoms, questions, or concerns.  She is appreciative of call and denies additional questions or concerns at this time.

## 2015-11-11 NOTE — Telephone Encounter (Signed)
Agree with your advice to check blood pressure. Latanoprost should have little impact on her rhythm or blood pressure though.

## 2015-11-11 NOTE — Telephone Encounter (Signed)
The correct providerr is Dr Royann Shiversroitoru

## 2015-11-11 NOTE — Telephone Encounter (Signed)
Pt haven not felt quite right about her pacemaker,felt like her heart was slowing down. Pt did a pacer down load last night,please check and let her know if it was all right.

## 2015-11-11 NOTE — Telephone Encounter (Signed)
I hit the wrong provider in  First encounter.

## 2015-11-12 NOTE — Telephone Encounter (Signed)
Pt daughter calling you back. Please call her back.

## 2015-11-12 NOTE — Telephone Encounter (Signed)
Called [name] back to let her know.  She reports that patient had the same sensation last night "just before I gave her the eye drops" and she felt lightheaded and felt like she was choking.  The sensation lasts only a few minutes.  [name] checked her BP and it was 116/69.  Advised [name] that her latanoprost is likely not causing these symptoms.  Patient also reports feeling nauseous with these episodes and "she feels like her heart rate is slowing down".  [facility] that there are no abnormalities or alerts on the Carelink transmission she sent yesterday.  Advised her to continue checking BP during episodes and that I would forward the information to Dr. Royann Shiversroitoru.  She voices understanding and is aware to call if patient has new or worsening symptoms.

## 2015-11-12 NOTE — Telephone Encounter (Signed)
Please have her come in for an in-office device check

## 2015-11-12 NOTE — Telephone Encounter (Signed)
LM requesting call back.  

## 2015-11-13 ENCOUNTER — Ambulatory Visit (INDEPENDENT_AMBULATORY_CARE_PROVIDER_SITE_OTHER): Payer: Medicare Other | Admitting: *Deleted

## 2015-11-13 DIAGNOSIS — I484 Atypical atrial flutter: Secondary | ICD-10-CM | POA: Diagnosis not present

## 2015-11-13 DIAGNOSIS — I442 Atrioventricular block, complete: Secondary | ICD-10-CM | POA: Diagnosis not present

## 2015-11-13 LAB — CUP PACEART INCLINIC DEVICE CHECK
Battery Impedance: 100 Ohm
Battery Remaining Longevity: 137 mo
Battery Voltage: 2.78 V
Brady Statistic AP VP Percent: 95 %
Brady Statistic AP VS Percent: 1 %
Brady Statistic AS VP Percent: 4 %
Brady Statistic AS VS Percent: 0 %
Date Time Interrogation Session: 20161123165738
Implantable Lead Implant Date: 20070920
Implantable Lead Implant Date: 20070920
Implantable Lead Location: 753859
Implantable Lead Location: 753860
Implantable Lead Model: 4592
Implantable Lead Model: 5092
Lead Channel Impedance Value: 518 Ohm
Lead Channel Impedance Value: 825 Ohm
Lead Channel Pacing Threshold Amplitude: 0.75 V
Lead Channel Pacing Threshold Amplitude: 0.75 V
Lead Channel Pacing Threshold Amplitude: 1 V
Lead Channel Pacing Threshold Amplitude: 1.125 V
Lead Channel Pacing Threshold Pulse Width: 0.4 ms
Lead Channel Pacing Threshold Pulse Width: 0.4 ms
Lead Channel Pacing Threshold Pulse Width: 0.4 ms
Lead Channel Pacing Threshold Pulse Width: 0.4 ms
Lead Channel Sensing Intrinsic Amplitude: 2.8 mV
Lead Channel Setting Pacing Amplitude: 2 V
Lead Channel Setting Pacing Amplitude: 2.25 V
Lead Channel Setting Pacing Pulse Width: 0.4 ms
Lead Channel Setting Sensing Sensitivity: 5.6 mV

## 2015-11-13 NOTE — Telephone Encounter (Signed)
Patient's daughter returned call.  She states that she is unable to bring her mother in for an appointment today, but is agreeable to PPM check in the device clinic on Monday, 11/18/15 at 3:00pm.  She states that her mother did not have any symptoms last night and that she believes it could be related to her not drinking enough water some days.  Advised that being dehydrated can cause lightheadedness/dizziness, but that it would still be advisable to have her device checked in office.  Patient's daughter is agreeable and appreciative.  She is aware that our office will be closed for the holiday and that if her mother has worsening symptoms, that she can take her to the ED.  Daughter denies any additional questions or concerns.

## 2015-11-13 NOTE — Progress Notes (Signed)
Pacemaker check in clinic for dizziness. Per daughter---patient has started drinking more water and has noticed some relief in her sx's. Normal device function. Thresholds, sensing, impedances consistent with previous measurements. Device programmed to maximize longevity. (10) mode switches (<0.1%)--all <30 sec, no EGMs. No high ventricular rates noted. Device programmed at appropriate safety margins. Histogram distribution appropriate for patient activity level. Device programmed to optimize intrinsic conduction. Estimated longevity 11.5 years. [facility]

## 2015-11-27 ENCOUNTER — Encounter: Payer: Self-pay | Admitting: Cardiovascular Disease

## 2015-12-18 ENCOUNTER — Other Ambulatory Visit: Payer: Self-pay

## 2015-12-18 DIAGNOSIS — Z1231 Encounter for screening mammogram for malignant neoplasm of breast: Secondary | ICD-10-CM

## 2016-01-14 ENCOUNTER — Encounter: Payer: Self-pay | Admitting: Cardiovascular Disease

## 2016-01-14 ENCOUNTER — Ambulatory Visit (INDEPENDENT_AMBULATORY_CARE_PROVIDER_SITE_OTHER): Payer: Medicare Other | Admitting: Cardiovascular Disease

## 2016-01-14 VITALS — BP 135/83 | HR 75 | Ht 64.0 in | Wt 158.8 lb

## 2016-01-14 DIAGNOSIS — I4891 Unspecified atrial fibrillation: Secondary | ICD-10-CM | POA: Diagnosis not present

## 2016-01-14 DIAGNOSIS — Z950 Presence of cardiac pacemaker: Secondary | ICD-10-CM

## 2016-01-14 DIAGNOSIS — I442 Atrioventricular block, complete: Secondary | ICD-10-CM | POA: Diagnosis not present

## 2016-01-14 DIAGNOSIS — I5032 Chronic diastolic (congestive) heart failure: Secondary | ICD-10-CM

## 2016-01-14 DIAGNOSIS — I370 Nonrheumatic pulmonary valve stenosis: Secondary | ICD-10-CM | POA: Diagnosis not present

## 2016-01-14 DIAGNOSIS — Z9889 Other specified postprocedural states: Secondary | ICD-10-CM

## 2016-01-14 DIAGNOSIS — Z7901 Long term (current) use of anticoagulants: Secondary | ICD-10-CM

## 2016-01-14 DIAGNOSIS — Z8774 Personal history of (corrected) congenital malformations of heart and circulatory system: Secondary | ICD-10-CM

## 2016-01-14 NOTE — Patient Instructions (Signed)
Remote monitoring is used to monitor your Pacemaker from home. This monitoring reduces the number of office visits required to check your device to one time per year. It allows [place] to monitor the functioning of your device to ensure it is working properly. You are scheduled for a device check from home on April 14, 2016. You may send your transmission at any time that day. If you have a wireless device, the transmission will be sent automatically. After your physician reviews your transmission, you will receive a postcard with your next transmission date.  Dr. Royann Shivers recommends that you schedule a follow-up appointment in: 6 MONTHS

## 2016-01-14 NOTE — Progress Notes (Signed)
Patient ID: [name] Baird, female   DOB: [birth year], 82 y.o.   MRN: [id number]    Cardiology Office Note    Date:  01/16/2016   ID:  [name] Baird, DOB [birth year], MRN [id number]  PCP:  Tomi Bamberger, NP  Cardiologist:   Thurmon Fair, MD   Chief Complaint  Patient presents with  . Pacemaker Check    History of Present Illness:  [name] Baird is a 82 y.o. female with a history of congenital heart disease (atrial septal defect status post patch closure, pulmonic stenosis status post valvotomy), complete heart block status post dual-chamber permanent pacemaker implantation and recurrent problems with paroxysmal atrial flutter and atrial fibrillation on Xarelto. She has history of diastolic heart failure without recent exacerbation.  She generally feels well. She has a few episodes of weakness and dizziness but when she has transmitted her pacemaker downloads for these events, no arrhythmia was detected. She associates these episodes with administration of her eyedrops, although these do not appear likely to cause hemodynamic changes.  Pacemaker interrogation shows normal device function. She has 99% atrial pacing and 100% ventricular pacing. No episodes of atrial or ventricular arrhythmias have been detected. Lead parameters are excellent. She is pacemaker dependent without underlying ventricular escape rhythm.  She had a flutter ablation performed by Dr. Johney Frame in July 2014 and is not taking any antiarrhythmics at this time, other than a low dose of beta blocker. At the time of the EP study she was noted to have conventional counterclockwise isthmus-dependent right atrial flutter but then a second type of right atrial flutter was noted briefly. It could not be reinduced for full study.    Past Medical History  Diagnosis Date  . Hypertension   . Atrial flutter (HCC)     s/p ablation 06/29/2013 by Dr Johney Frame  . Complete heart block (HCC) 2007    s/p medtronic pacemaker implantation  .  Depression   . Anxiety   . CHF (congestive heart failure) (HCC)   . Glaucoma   . Status post patch closure of ASD   . Pulmonary stenosis sp valvotomy        . Shortness of breath     Past Surgical History  Procedure Laterality Date  . Aortic valve repair    . Pulmonary valvotomy  1985  . Trigeminal sx      left side  . Cardioversion N/A 03/24/2013    Procedure: CARDIOVERSION;  Surgeon: Thurmon Fair, MD;  Location: [facility] ENDOSCOPY;  Service: Cardiovascular;  Laterality: N/A;  . Pacemaker insertion  2007    Medtronic Adapta ADDRL1, serial # [id number]  . Ablation  06/29/2013    s/p isthmus ablation for atrial flutter by Dr Johney Frame  . Atrial flutter ablation N/A 06/29/2013    Procedure: ATRIAL FLUTTER ABLATION;  Surgeon: Hillis Range, MD;  Location: [facility] CATH LAB;  Service: Cardiovascular;  Laterality: N/A;  . Ep implantable device N/A 06/27/2015    Procedure: PPM Generator Changeout;  Surgeon: Thurmon Fair, MD;  Location: [facility] INVASIVE CV LAB;  Service: Cardiovascular;  Laterality: N/A;  . Cardiac catheterization N/A 06/27/2015    Procedure: Temporary Pacemaker;  Surgeon: Thurmon Fair, MD;  Location: [facility] INVASIVE CV LAB;  Service: Cardiovascular;  Laterality: N/A;    Outpatient Prescriptions Prior to Visit  Medication Sig Dispense Refill  . acetaminophen (TYLENOL ARTHRITIS PAIN) 650 MG CR tablet Take 1,300 mg by mouth daily as needed for pain.     . brimonidine (ALPHAGAN) 0.2 % ophthalmic solution Place 1  drop into the left eye 2 (two) times daily.  5  . calcium-vitamin D (OSCAL) 250-125 MG-UNIT per tablet Take 1 tablet by mouth daily.    . carvedilol (COREG) 12.5 MG tablet Take 1 tablet (12.5 mg total) by mouth 2 (two) times daily. 60 tablet 5  . clonazePAM (KLONOPIN) 0.5 MG tablet Take 0.5 mg by mouth daily as needed.     . dorzolamide-timolol (COSOPT) 22.3-6.8 MG/ML ophthalmic solution Place 2 drops into both eyes 2 (two) times daily.    . furosemide (LASIX) 40 MG tablet Take 1 tablet by  mouth daily. May take an extra dose daily as needed for weight gain. 180 tablet 1  . latanoprost (XALATAN) 0.005 % ophthalmic solution Place 1 drop into both eyes at bedtime.    [name] Kitchen losartan (COZAAR) 50 MG tablet Take 1 tablet (50 mg total) by mouth daily. 90 tablet 1  . Multiple Vitamins-Minerals (MULTIVITAMIN PO) Take 1 tablet by mouth daily.     [name] Kitchen neomycin-polymyxin b-dexamethasone (MAXITROL) 3.5-10000-0.1 OINT Place 1 application into the left eye 2 (two) times daily.  2  . ofloxacin (OCUFLOX) 0.3 % ophthalmic solution Place 1 drop into the left eye 2 (two) times daily.   0  . pilocarpine (PILOCAR) 1 % ophthalmic solution Place 4 drops into the left eye daily.     . potassium chloride (KLOR-CON M10) 10 MEQ tablet Take 2 tablets (20 mEq total) by mouth daily. 180 tablet 1  . rivaroxaban (XARELTO) 20 MG TABS tablet Take 1 tablet (20 mg total) by mouth daily with supper. 30 tablet 6  . rosuvastatin (CRESTOR) 10 MG tablet Take 10 mg by mouth at bedtime.    [name] Kitchen venlafaxine XR (EFFEXOR-XR) 75 MG 24 hr capsule Take 75 mg by mouth daily.    . nebivolol (BYSTOLIC) 10 MG tablet Take 10 mg by mouth daily.    . rivaroxaban (XARELTO) 20 MG TABS tablet Take 1 tablet (20 mg total) by mouth daily with supper. 30 tablet 4   No facility-administered medications prior to visit.     Allergies:   Sulfa antibiotics and Hydrocodone   Social History   Social History  . Marital Status: Widowed    Spouse Name: N/A  . Number of Children: 2  . Years of Education: N/A   Social History Main Topics  . Smoking status: Never Smoker   . Smokeless tobacco: Never Used  . Alcohol Use: No  . Drug Use: No  . Sexual Activity: No   Other Topics Concern  . None   Social History Narrative    ROS:   Please see the history of present illness.    ROS All other systems reviewed and are negative.   PHYSICAL EXAM:   VS:  BP 135/83 mmHg  Pulse 75  Ht  (1.626 m)  Wt 158 lb 12.8 oz (72.031 kg)  BMI 27.24 kg/m2     GEN: Well nourished, well developed, in no acute distress HEENT: normal Neck: no JVD, carotid bruits, or masses Cardiac: RRR; normal first and paradoxically split second heart sounds, no rubs or gallops, 1-2/6 decrescendo diastolic murmur at the right and left lower sternal border,no edema  Respiratory:  clear to auscultation bilaterally, normal work of breathing GI: soft, nontender, nondistended, + BS MS: no deformity or atrophy Skin: warm and dry, no rash Neuro:  Alert and Oriented x 3, Strength and sensation are intact Psych: euthymic mood, full affect  Wt Readings from Last 3 Encounters:  01/14/16 158  lb 12.8 oz (72.031 kg)  06/27/15 161 lb (73.029 kg)  05/14/15 158 lb 14.4 oz (72.077 kg)      Studies/Labs Reviewed:   EKG:  EKG is ordered today.  The ekg ordered today demonstrates AV sequential paced rhythm  Recent Labs: 06/25/2015: ALT 16; BUN 18; Creat 1.03; Hemoglobin 13.9; Platelets 199; Potassium 4.1; Sodium 142   Lipid Panel No results found for: CHOL, TRIG, HDL, CHOLHDL, VLDL, LDLCALC, LDLDIRECT   ASSESSMENT:    1. Complete heart block (HCC)   2. Pacemaker   3. Atrial fibrillation, unspecified type (HCC)   4. Chronic diastolic heart failure (HCC)   5. Pulmonary stenosis sp valvotomy   6. Status post patch closure of ASD   7. Long term (current) use of anticoagulants      PLAN:  In order of problems listed above:  1. CHB: Pacemaker dependent, very poorly tolerant of atrial threshold testing. No escape rhythm.  2. PPM: Normal device function, continue remote downloads every 3 months and office visit at least yearly. 3. PAFib: On appropriate anticoagulation. CHADSVasc 4 (age 89, gender, HF). No recent episodes by pacemaker check 4. CHF, diastolic: Maintaining euvolemic on a very low dose of diuretic. 2013, echo reported EF 40% septal dyssynchrony, but nuclear scintigraphy showed normal perfusion and normal LVEF 57%. 5. PS s/p surgical valvotomy: No residual  gradient, mild to moderate regurgitation by echo 6. ASD s/p patch closure: No residual shunt. 7. No bleeding complications  Echo also showed mild to moderate mitral insufficiency and mild to moderate aortic insufficiency  Medication Adjustments/Labs and Tests Ordered: Current medicines are reviewed at length with the patient today.  Concerns regarding medicines are outlined above.  Medication changes, Labs and Tests ordered today are listed in the Patient Instructions below. Patient Instructions  Remote monitoring is used to monitor your Pacemaker from home. This monitoring reduces the number of office visits required to check your device to one time per year. It allows [place] to monitor the functioning of your device to ensure it is working properly. You are scheduled for a device check from home on April 14, 2016. You may send your transmission at any time that day. If you have a wireless device, the transmission will be sent automatically. After your physician reviews your transmission, you will receive a postcard with your next transmission date.  Dr. Royann Shivers recommends that you schedule a follow-up appointment in: 6 MONTHS       Signed, Meosha Castanon, MD  01/16/2016 2:37 PM    [facility] [address] [place], [place], [place]  [address] Phone: [phone]; Fax: [phone]

## 2016-01-16 ENCOUNTER — Ambulatory Visit
Admission: RE | Admit: 2016-01-16 | Discharge: 2016-01-16 | Disposition: A | Discharge status: Home / Self Care | Payer: Medicare Other | Source: Ambulatory Visit

## 2016-01-16 DIAGNOSIS — I5032 Chronic diastolic (congestive) heart failure: Secondary | ICD-10-CM | POA: Insufficient documentation

## 2016-01-16 DIAGNOSIS — I5042 Chronic combined systolic (congestive) and diastolic (congestive) heart failure: Secondary | ICD-10-CM | POA: Insufficient documentation

## 2016-01-16 DIAGNOSIS — Z1231 Encounter for screening mammogram for malignant neoplasm of breast: Secondary | ICD-10-CM

## 2016-02-18 LAB — CUP PACEART INCLINIC DEVICE CHECK
Battery Impedance: 100 Ohm
Battery Remaining Longevity: 136 mo
Battery Voltage: 2.79 V
Brady Statistic AP VP Percent: 98 %
Brady Statistic AP VS Percent: 1 %
Brady Statistic AS VP Percent: 1 %
Brady Statistic AS VS Percent: 0 %
Date Time Interrogation Session: 20170124131629
Implantable Lead Implant Date: 20070920
Implantable Lead Implant Date: 20070920
Implantable Lead Location: 753859
Implantable Lead Location: 753860
Implantable Lead Model: 4592
Implantable Lead Model: 5092
Lead Channel Impedance Value: 510 Ohm
Lead Channel Impedance Value: 825 Ohm
Lead Channel Setting Pacing Amplitude: 2 V
Lead Channel Setting Pacing Amplitude: 2.25 V
Lead Channel Setting Pacing Pulse Width: 0.4 ms
Lead Channel Setting Sensing Sensitivity: 5.6 mV

## 2016-02-20 ENCOUNTER — Encounter: Payer: Self-pay | Admitting: Cardiovascular Disease

## 2016-02-27 ENCOUNTER — Telehealth: Payer: Self-pay | Admitting: Cardiovascular Disease

## 2016-02-27 NOTE — Telephone Encounter (Signed)
Returned call and spoke w/ daughter. She realized that she has been giving pt the carvedilol once a day, not twice a day as recommended. Apparent confusion about the freq of administration since Bystolic was a once daily med.  Symptoms of fatigue, weakness, SOB discussed.  Advised to correct dosage - this may help w/ symptoms, but we may also need to have pt return to office for EKG check or visit. She is aware I am routing to Dr. Royann Shiversroitoru for further advice.

## 2016-02-27 NOTE — Telephone Encounter (Signed)
New message      Pt c/o medication issue:  1. Name of Medication: carvedilol 2. How are you currently taking this medication (dosage and times per day)? 12.5mg  daily in am with food 3. Are you having a reaction (difficulty breathing--STAT)? no 4. What is your medication issue? Since starting medication, pt is fatigue, weak, no energy and mild sob.  Pt has been taking this for about 1 month.  Pt's ins would not pay for bystolic.  However, they are willing to switch back to bystolic and pay the difference

## 2016-02-28 NOTE — Telephone Encounter (Signed)
OK. If it is because of the medication dosing, she should feel improvement in just a few days since it is a fairly quick acting medication. If she still feels poorly by early next week, please make her an office appointment with a device check

## 2016-03-16 ENCOUNTER — Telehealth: Payer: Self-pay | Admitting: Cardiovascular Disease

## 2016-03-16 MED ORDER — NEBIVOLOL HCL 10 MG PO TABS
10.0000 mg | ORAL_TABLET | Freq: Every day | ORAL | Status: DC
Start: 2016-03-16 — End: 2016-10-07

## 2016-03-16 NOTE — Telephone Encounter (Signed)
Pt is going to have Tarrosophy surgery on 04-02-16. She needs to know about stopping her Xarelto. Pt is still not feeling well,even after taking the Carvedilol. She might need to be seen again.

## 2016-03-16 NOTE — Telephone Encounter (Signed)
Ok to switch back to her previous dose of Bystolic, instead of carvedilol.  She will probably need to stop her Xarelto for the eyelid surgery for 48 hours. Take last dose on 03-30-2016 in the evening, skip the next 2 evenings. Resume the evening after the procedure if her ophthalmologist thins it is safe to start it.

## 2016-03-16 NOTE — Telephone Encounter (Signed)
Returned call to patient's daughter LazearMisty.Dr.Croitoru's recommendations given.Bystolic 10 mg daily sent to pharmacy.Advised to call back if needed.

## 2016-03-16 NOTE — Telephone Encounter (Signed)
Returned call to patient's daughter [place]isty.She stated she wanted to ask Dr.Croitoru 2 questions #1 mother scheduled to have eye lid surgery 04/02/16 does she need to hold xarelto before surgery.#2 mother has not felt good since she started taking carvedilol 12.5 mg twice a day she is weak,sluggish,more sob.Stated she did not have any problems with bystolic.Insurance would not cover bystolic,she is willing to pay for.Message sent to Dr.Croitoru for advice.

## 2016-03-23 ENCOUNTER — Telehealth: Payer: Self-pay | Admitting: Cardiovascular Disease

## 2016-03-23 NOTE — Telephone Encounter (Signed)
Returned call to patient's daughter [place]isty.She stated mother did not feel good this past weekend.Stated she had chest tightness off and on,noticed more after she ate.Also sob.No swelling.Stated she sent in a download from her pacemaker this morning.Stated she feels ok this morning.She slept good last night.No chest tightness at present.Appointment scheduled with Wilburt FinlayBryan Hager PA 03/25/16 at 2:00 pm.Message sent to Trudi IdaShakila Saunders in device clinic Message sent to Dr.Croitoru for review.

## 2016-03-23 NOTE — Telephone Encounter (Signed)
Pt was still not feeling well yesterday,so she went on and sent a transmission for her pacemaker. She wanted you to be aware of this.

## 2016-03-23 NOTE — Telephone Encounter (Signed)
Persistent AFL since 02/16/2016. Dr.Croitoru aware.

## 2016-03-24 NOTE — Telephone Encounter (Signed)
Per Dr.Croitoru- will attempt overdrive pacing of AFL at patient's upcoming appt.   Will contact industry.  [name] informed of Dr.Croitoru's plan.

## 2016-03-25 ENCOUNTER — Ambulatory Visit (INDEPENDENT_AMBULATORY_CARE_PROVIDER_SITE_OTHER): Payer: Medicare Other | Admitting: Physician Assistant

## 2016-03-25 ENCOUNTER — Encounter: Payer: Self-pay | Admitting: Physician Assistant

## 2016-03-25 VITALS — BP 110/70 | HR 70 | Ht 64.0 in | Wt 158.0 lb

## 2016-03-25 DIAGNOSIS — I484 Atypical atrial flutter: Secondary | ICD-10-CM | POA: Diagnosis not present

## 2016-03-25 DIAGNOSIS — I5032 Chronic diastolic (congestive) heart failure: Secondary | ICD-10-CM

## 2016-03-25 DIAGNOSIS — R1013 Epigastric pain: Secondary | ICD-10-CM

## 2016-03-25 DIAGNOSIS — I4891 Unspecified atrial fibrillation: Secondary | ICD-10-CM

## 2016-03-25 DIAGNOSIS — K30 Functional dyspepsia: Secondary | ICD-10-CM | POA: Insufficient documentation

## 2016-03-25 MED ORDER — PANTOPRAZOLE SODIUM 40 MG PO TBEC: 40.0000 mg | DELAYED_RELEASE_TABLET | Freq: Every day | ORAL | Status: DC

## 2016-03-25 NOTE — Progress Notes (Signed)
Patient ID: [name] CurtMona S Baird, female   DOB: 12-29-1932, 82 y.o.   MRN: [id number]    Date:  03/25/2016   ID:  [name] CurtMona S Baird, DOB 12-29-1932, MRN [id number]  PCP:  Tomi BambergerFULLER,SUSAN, NP  Primary Cardiologist:  Croitoru   Chief Complaint  Patient presents with  . Chest Pain    Tightness and shortness of breath, no dizziness or lightheadedness     History of Present Illness: [name] Baird is a 82 y.o. female with a history of congenital heart disease (atrial septal defect status post patch closure, pulmonic stenosis status post valvotomy), complete heart block status post dual-chamber permanent pacemaker implantation and recurrent problems with paroxysmal atrial flutter and atrial fibrillation on Xarelto. She has history of diastolic heart failure without recent exacerbation.  She generally feels well. She has a few episodes of weakness and dizziness but when she has transmitted her pacemaker downloads for these events, no arrhythmia was detected. She associates these episodes with administration of her eyedrops, although these do not appear likely to cause hemodynamic changes.  Pacemaker interrogation during her last office visit in January showed normal device function. She has 99% atrial pacing and 100% ventricular pacing. No episodes of atrial or ventricular arrhythmias have been detected. Lead parameters are excellent. She is pacemaker dependent without underlying ventricular escape rhythm.  She had a flutter ablation performed by Dr. Johney FrameAllred in July 2014 and is not taking any antiarrhythmics at this time, other than a low dose of beta blocker. At the time of the EP study she was noted to have conventional counterclockwise isthmus-dependent right atrial flutter but then a second type of right atrial flutter was noted briefly. It could not be reinduced for full study.   She presents today with complaints of shortness of breath with exertion. Pacemaker interrogation shows she is in atrial flutter and has  been for 37 days.  She also reports having some epigastric/lower chest tightness right after eating. She also reports a little bit of acid reflux.  The patient currently denies nausea, vomiting, fever, orthopnea, dizziness, PND, cough, congestion, abdominal pain, hematochezia, melena, lower extremity edema, claudication.  Wt Readings from Last 3 Encounters:  03/25/16 158 lb (71.668 kg)  01/14/16 158 lb 12.8 oz (72.031 kg)  06/27/15 161 lb (73.029 kg)     Past Medical History  Diagnosis Date  . Hypertension   . Atrial flutter (HCC)     s/p ablation 06/29/2013 by Dr Johney FrameAllred  . Complete heart block (HCC) 2007    s/p medtronic pacemaker implantation  . Depression   . Anxiety   . CHF (congestive heart failure) (HCC)   . Glaucoma   . Status post patch closure of ASD   . Pulmonary stenosis sp valvotomy        . Shortness of breath     Current Outpatient Prescriptions  Medication Sig Dispense Refill  . acetaminophen (TYLENOL ARTHRITIS PAIN) 650 MG CR tablet Take 1,300 mg by mouth daily as needed for pain.     . brimonidine (ALPHAGAN) 0.2 % ophthalmic solution Place 1 drop into the left eye 2 (two) times daily.  5  . calcium-vitamin D (OSCAL) 250-125 MG-UNIT per tablet Take 1 tablet by mouth daily.    . clonazePAM (KLONOPIN) 0.5 MG tablet Take 0.5 mg by mouth daily as needed.     . Difluprednate (DUREZOL) 0.05 % EMUL Place 1 drop into the left eye daily.    . dorzolamide-timolol (COSOPT) 22.3-6.8 MG/ML ophthalmic solution Place 2 drops  into both eyes 2 (two) times daily.    . furosemide (LASIX) 40 MG tablet Take 1 tablet by mouth daily. May take an extra dose daily as needed for weight gain. 180 tablet 1  . latanoprost (XALATAN) 0.005 % ophthalmic solution Place 1 drop into both eyes at bedtime.    [name] Kitchen losartan (COZAAR) 50 MG tablet Take 1 tablet (50 mg total) by mouth daily. 90 tablet 1  . Multiple Vitamins-Minerals (MULTIVITAMIN PO) Take 1 tablet by mouth daily.     . nebivolol (BYSTOLIC)  10 MG tablet Take 1 tablet (10 mg total) by mouth daily. 30 tablet 6  . neomycin-polymyxin b-dexamethasone (MAXITROL) 3.5-10000-0.1 OINT Place 1 application into the left eye 2 (two) times daily.  2  . ofloxacin (OCUFLOX) 0.3 % ophthalmic solution Place 1 drop into the left eye 2 (two) times daily.   0  . pilocarpine (PILOCAR) 1 % ophthalmic solution Place 4 drops into the left eye daily.     . potassium chloride (KLOR-CON M10) 10 MEQ tablet Take 2 tablets (20 mEq total) by mouth daily. 180 tablet 1  . rivaroxaban (XARELTO) 20 MG TABS tablet Take 1 tablet (20 mg total) by mouth daily with supper. 30 tablet 6  . rosuvastatin (CRESTOR) 10 MG tablet Take 10 mg by mouth at bedtime.    [name] Kitchen venlafaxine XR (EFFEXOR-XR) 75 MG 24 hr capsule Take 75 mg by mouth daily.    . pantoprazole (PROTONIX) 40 MG tablet Take 1 tablet (40 mg total) by mouth daily. 30 tablet 11   No current facility-administered medications for this visit.    Allergies:    Allergies  Allergen Reactions  . Sulfa Antibiotics Other (See Comments)    welps   . Hydrocodone     nausea    Social History:  The patient  reports that she has never smoked. She has never used smokeless tobacco. She reports that she does not drink alcohol or use illicit drugs.   Family history:  No family history on file.  ROS:  Please see the history of present illness.  All other systems reviewed and negative.   PHYSICAL EXAM: VS:  BP 110/70 mmHg  Pulse 70  Ht  (1.626 m)  Wt 158 lb (71.668 kg)  BMI 27.11 kg/m2 Overweight, well developed, in no acute distress HEENT: Pupils are equal round react to light accommodation extraocular movements are intact.  Neck: no JVDNo cervical lymphadenopathy. Cardiac: Regular rate and rhythm without murmurs rubs or gallops. Lungs:  clear to auscultation bilaterally, no wheezing, rhonchi or rales Abd: soft, nontender, positive bowel sounds all quadrants, no hepatosplenomegaly Ext: no lower extremity edema.   2+ radial and dorsalis pedis pulses. Skin: warm and dry Neuro:  Grossly normal  EKG:   Paced rhythm underlying atrial flutter  ASSESSMENT AND PLAN:  Problem List Items Addressed This Visit    Indigestion   Chronic diastolic heart failure (HCC)   Relevant Orders   EKG 12-Lead   Atrial flutter (HCC) - Primary   Relevant Orders   EKG 12-Lead   Atrial fibrillation (HCC)   Relevant Orders   EKG 12-Lead     Patient is in atrial flutter. Dr. Royann Shivers and Leta Jungling Hayes(Medtronic rep) attempted to overdrive pace her but were unsuccessful.  Patient will download her pacemaker device this coming Friday. She continues to be in atrial flutter or fibrillation we will schedule a DC CV the week after next when Dr. Royann Shivers is available.  Currently patient is stable  blood pressures well-controlled.  Also prescribed her some Protonix to help with acid indigestion.

## 2016-03-25 NOTE — Patient Instructions (Signed)
Medication Instructions:  Your physician recommends that you continue on your current medications as directed. Please refer to the Current Medication list given to you today.   Labwork: None ordered  Testing/Procedures: None ordered  Follow-Up: Remote monitoring is used to monitor your Pacemaker of ICD from home. This monitoring reduces the number of office visits required to check your device to one time per year. It allows us to keep an eye on the functioning of your device to ensure it is working properly. You are scheduled for a device check from home on 03/27/16. You may send your transmission at any time that day. If you have a wireless device, the transmission will be sent automatically. After your physician reviews your transmission, you will receive a postcard with your next transmission date.  Your physician recommends that you schedule a follow-up appointment as planned with Dr.Croitoru in July 2017 or sooner pending your remote device transmission    Any Other Special Instructions Will Be Listed Below (If Applicable).     If you need a refill on your cardiac medications before your next appointment, please call your pharmacy.

## 2016-03-26 ENCOUNTER — Ambulatory Visit: Payer: Medicare Other | Admitting: Physician Assistant

## 2016-03-27 ENCOUNTER — Ambulatory Visit (INDEPENDENT_AMBULATORY_CARE_PROVIDER_SITE_OTHER): Payer: Medicare Other | Admitting: *Deleted

## 2016-03-27 ENCOUNTER — Telehealth: Payer: Self-pay | Admitting: Cardiovascular Disease

## 2016-03-27 DIAGNOSIS — I4891 Unspecified atrial fibrillation: Secondary | ICD-10-CM

## 2016-03-27 DIAGNOSIS — Z45010 Encounter for checking and testing of cardiac pacemaker pulse generator [battery]: Secondary | ICD-10-CM

## 2016-03-27 NOTE — Progress Notes (Signed)
Remote pacemaker transmission.   

## 2016-03-27 NOTE — Telephone Encounter (Signed)
Please tell her that she is actually back in normal rhythm which is an excellent surprise. I would give it another 2 or 3 days to see if she doesn't start feeling better. Would also like her to have another echocardiogram, in case there's been some change in heart pumping function at actually causing her to feel different. Please order echo for afib

## 2016-03-27 NOTE — Telephone Encounter (Signed)
Returned call to HomerMisty (daughter), noted heart rhythm normal, advised 2-3 days for improvement per Dr. Royann Shiversroitoru. Also noted his recommendation for f/u echo. She voiced understanding of this and rationale for testing, will await scheduler call.  I have placed order and sent a message to scheduling.

## 2016-03-27 NOTE — Telephone Encounter (Signed)
Returned daughter's call. Notes pt feels about the same as she did when seen in office on Wed. Wanted to inform that the home transmission was sent this morning & see if any further recommendations from Dr. Royann Shiversroitoru.  Caller aware I will route for device inquiry & that someone will follow up.

## 2016-03-27 NOTE — Telephone Encounter (Signed)
New message    Home transmitting was done this am.    Patient C/O still not feeling herself.

## 2016-03-31 ENCOUNTER — Encounter: Payer: Self-pay | Admitting: Cardiovascular Disease

## 2016-03-31 ENCOUNTER — Telehealth: Payer: Self-pay | Admitting: Cardiovascular Disease

## 2016-03-31 NOTE — Telephone Encounter (Signed)
I did not need encounter

## 2016-04-06 ENCOUNTER — Ambulatory Visit (HOSPITAL_COMMUNITY)
Admission: RE | Admit: 2016-04-06 | Discharge: 2016-04-06 | Disposition: A | Discharge status: Home / Self Care | Payer: Medicare Other | Source: Ambulatory Visit | Attending: Cardiology | Admitting: Cardiology

## 2016-04-06 DIAGNOSIS — I351 Nonrheumatic aortic (valve) insufficiency: Secondary | ICD-10-CM | POA: Insufficient documentation

## 2016-04-06 DIAGNOSIS — I4891 Unspecified atrial fibrillation: Secondary | ICD-10-CM | POA: Diagnosis not present

## 2016-04-06 DIAGNOSIS — I340 Nonrheumatic mitral (valve) insufficiency: Secondary | ICD-10-CM | POA: Insufficient documentation

## 2016-04-06 DIAGNOSIS — I517 Cardiomegaly: Secondary | ICD-10-CM | POA: Diagnosis not present

## 2016-04-06 DIAGNOSIS — I371 Nonrheumatic pulmonary valve insufficiency: Secondary | ICD-10-CM | POA: Diagnosis not present

## 2016-04-06 DIAGNOSIS — Z7901 Long term (current) use of anticoagulants: Secondary | ICD-10-CM | POA: Diagnosis not present

## 2016-04-08 ENCOUNTER — Telehealth: Payer: Self-pay | Admitting: Cardiovascular Disease

## 2016-04-08 NOTE — Telephone Encounter (Signed)
Returned call to patient's daughter [name] echo results given.

## 2016-04-08 NOTE — Telephone Encounter (Signed)
Pt's dtr [name] Lovings returning call re echo results-pls call  [phone] or [phone]

## 2016-04-19 ENCOUNTER — Other Ambulatory Visit: Payer: Self-pay | Admitting: Cardiovascular Disease

## 2016-04-20 NOTE — Telephone Encounter (Signed)
Rx request sent to pharmacy.  

## 2016-04-28 ENCOUNTER — Ambulatory Visit (INDEPENDENT_AMBULATORY_CARE_PROVIDER_SITE_OTHER): Payer: Medicare Other | Admitting: *Deleted

## 2016-04-28 DIAGNOSIS — I442 Atrioventricular block, complete: Secondary | ICD-10-CM | POA: Diagnosis not present

## 2016-04-29 NOTE — Progress Notes (Signed)
Remote pacemaker transmission.   

## 2016-05-14 ENCOUNTER — Encounter: Payer: Self-pay | Admitting: Cardiology

## 2016-05-14 LAB — CUP PACEART REMOTE DEVICE CHECK
Battery Impedance: 100 Ohm
Battery Remaining Longevity: 138 mo
Battery Voltage: 2.79 V
Brady Statistic AP VP Percent: 93 %
Brady Statistic AP VS Percent: 1 %
Brady Statistic AS VP Percent: 5 %
Brady Statistic AS VS Percent: 1 %
Date Time Interrogation Session: 20170407082837
Implantable Lead Implant Date: 20070920
Implantable Lead Implant Date: 20070920
Implantable Lead Location: 753859
Implantable Lead Location: 753860
Implantable Lead Model: 4592
Implantable Lead Model: 5092
Lead Channel Impedance Value: 509 Ohm
Lead Channel Impedance Value: 732 Ohm
Lead Channel Pacing Threshold Amplitude: 0.625 V
Lead Channel Pacing Threshold Amplitude: 1.125 V
Lead Channel Pacing Threshold Pulse Width: 0.4 ms
Lead Channel Pacing Threshold Pulse Width: 0.4 ms
Lead Channel Setting Pacing Amplitude: 2 V
Lead Channel Setting Pacing Amplitude: 2.25 V
Lead Channel Setting Pacing Pulse Width: 0.4 ms
Lead Channel Setting Sensing Sensitivity: 5.6 mV

## 2016-05-22 ENCOUNTER — Other Ambulatory Visit
Admission: RE | Admit: 2016-05-22 | Discharge: 2016-05-22 | Disposition: A | Discharge status: Home / Self Care | Payer: Medicare Other | Source: Ambulatory Visit | Attending: Ophthalmology | Admitting: Ophthalmology

## 2016-05-22 DIAGNOSIS — H16002 Unspecified corneal ulcer, left eye: Secondary | ICD-10-CM | POA: Insufficient documentation

## 2016-05-26 LAB — WOUND CULTURE: Gram Stain: NONE SEEN

## 2016-05-30 ENCOUNTER — Other Ambulatory Visit: Payer: Self-pay | Admitting: Cardiovascular Disease

## 2016-06-01 ENCOUNTER — Telehealth: Payer: Self-pay | Admitting: Cardiology

## 2016-06-01 ENCOUNTER — Ambulatory Visit (INDEPENDENT_AMBULATORY_CARE_PROVIDER_SITE_OTHER): Payer: Medicare Other | Admitting: *Deleted

## 2016-06-01 DIAGNOSIS — Z45010 Encounter for checking and testing of cardiac pacemaker pulse generator [battery]: Secondary | ICD-10-CM

## 2016-06-01 NOTE — Telephone Encounter (Signed)
LMOV[facility] reminding pt to send remote transmission.   

## 2016-06-02 NOTE — Progress Notes (Signed)
Remote pacemaker transmission.   

## 2016-06-03 LAB — CUP PACEART REMOTE DEVICE CHECK
Battery Impedance: 100 Ohm
Battery Remaining Longevity: 138 mo
Battery Voltage: 2.79 V
Brady Statistic AP VP Percent: 97 %
Brady Statistic AP VS Percent: 1 %
Brady Statistic AS VP Percent: 2 %
Brady Statistic AS VS Percent: 0 %
Date Time Interrogation Session: 20170510010238
Implantable Lead Implant Date: 20070920
Implantable Lead Implant Date: 20070920
Implantable Lead Location: 753859
Implantable Lead Location: 753860
Implantable Lead Model: 4592
Implantable Lead Model: 5092
Lead Channel Impedance Value: 534 Ohm
Lead Channel Impedance Value: 874 Ohm
Lead Channel Pacing Threshold Amplitude: 0.625 V
Lead Channel Pacing Threshold Amplitude: 1.125 V
Lead Channel Pacing Threshold Pulse Width: 0.4 ms
Lead Channel Pacing Threshold Pulse Width: 0.4 ms
Lead Channel Setting Pacing Amplitude: 2 V
Lead Channel Setting Pacing Amplitude: 2.25 V
Lead Channel Setting Pacing Pulse Width: 0.4 ms
Lead Channel Setting Sensing Sensitivity: 5.6 mV

## 2016-06-25 ENCOUNTER — Ambulatory Visit (INDEPENDENT_AMBULATORY_CARE_PROVIDER_SITE_OTHER): Payer: Medicare Other | Admitting: Cardiovascular Disease

## 2016-06-25 ENCOUNTER — Encounter: Payer: Self-pay | Admitting: Cardiovascular Disease

## 2016-06-25 VITALS — BP 130/80 | HR 88 | Ht 64.0 in | Wt 161.0 lb

## 2016-06-25 DIAGNOSIS — I442 Atrioventricular block, complete: Secondary | ICD-10-CM

## 2016-06-25 DIAGNOSIS — Z950 Presence of cardiac pacemaker: Secondary | ICD-10-CM

## 2016-06-25 DIAGNOSIS — Z9889 Other specified postprocedural states: Secondary | ICD-10-CM

## 2016-06-25 DIAGNOSIS — I480 Paroxysmal atrial fibrillation: Secondary | ICD-10-CM | POA: Diagnosis not present

## 2016-06-25 DIAGNOSIS — I370 Nonrheumatic pulmonary valve stenosis: Secondary | ICD-10-CM

## 2016-06-25 DIAGNOSIS — Z8774 Personal history of (corrected) congenital malformations of heart and circulatory system: Secondary | ICD-10-CM

## 2016-06-25 DIAGNOSIS — Z7901 Long term (current) use of anticoagulants: Secondary | ICD-10-CM

## 2016-06-25 DIAGNOSIS — I5032 Chronic diastolic (congestive) heart failure: Secondary | ICD-10-CM

## 2016-06-25 NOTE — Patient Instructions (Signed)
Dr Royann Shiversroitoru recommends that you continue on your current medications as directed. Please refer to the Current Medication list given to you today.  Dr Royann Shiversroitoru recommends that you schedule a follow-up appointment in November 2017. You will receive a reminder letter in the mail two months in advance. If you don't receive a letter, please call our office to schedule the follow-up appointment.  If you need a refill on your cardiac medications before your next appointment, please call your pharmacy.

## 2016-06-25 NOTE — Progress Notes (Signed)
ROS  Patient ID: [name] Baird, female   DOB: [birth year], 82 y.o.   MRN: [id number]    Cardiology Office Note    Date:  06/26/2016   ID:  [name] Baird, DOB [birth year], MRN [id number]  PCP:  Tomi BambergerFULLER,SUSAN, NP  Cardiologist:   Thurmon FairMihai Sariyah Corcino, MD   Chief Complaint  Patient presents with  . Follow-up    History of Present Illness:  [name] Baird is a 82 y.o. female with a history of congenital heart disease (atrial septal defect status post patch closure, pulmonic stenosis status post valvotomy), complete heart block status post dual-chamber permanent pacemaker implantation and recurrent problems with paroxysmal atrial flutter and atrial fibrillation on Xarelto. She has history of diastolic heart failure without recent exacerbation.  She generally feels well. Some weakness and lightheadedness earlier this year improved after treatment for urinary tract infection  She infrequently takes an extra dose of diuretic for leg edema, no more than twice a month.  Pacemaker interrogation Apr 29, 2016 shows normal device function. She has 97% atrial pacing and 100% ventricular pacing. No episodes of atrial or ventricular arrhythmias have been detected. Lead parameters are excellent. She is pacemaker dependent without underlying ventricular escape rhythm.  She had a flutter ablation performed by Dr. Johney FrameAllred in July 2014 and is not taking any antiarrhythmics at this time, other than a low dose of beta blocker. At the time of the EP study she was noted to have conventional counterclockwise isthmus-dependent right atrial flutter but then a second type of right atrial flutter was noted briefly. It could not be reinduced for full study.    Past Medical History  Diagnosis Date  . Hypertension   . Atrial flutter (HCC)     s/p ablation 06/29/2013 by Dr Johney FrameAllred  . Complete heart block (HCC) 2007    s/p medtronic pacemaker implantation  . Depression   . Anxiety   . CHF (congestive heart failure) (HCC)   .  Glaucoma   . Status post patch closure of ASD   . Pulmonary stenosis sp valvotomy        . Shortness of breath     Past Surgical History  Procedure Laterality Date  . Aortic valve repair    . Pulmonary valvotomy  1985  . Trigeminal sx      left side  . Cardioversion N/A 03/24/2013    Procedure: CARDIOVERSION;  Surgeon: Thurmon FairMihai Boyd Buffalo, MD;  Location: [facility] ENDOSCOPY;  Service: Cardiovascular;  Laterality: N/A;  . Pacemaker insertion  2007    Medtronic Adapta ADDRL1, serial # [id number]  . Ablation  06/29/2013    s/p isthmus ablation for atrial flutter by Dr Johney FrameAllred  . Atrial flutter ablation N/A 06/29/2013    Procedure: ATRIAL FLUTTER ABLATION;  Surgeon: Hillis RangeJames Allred, MD;  [facility];  Service: Cardiovascular;  Laterality: N/A;  . Ep implantable device N/A 06/27/2015    Procedure: PPM Generator Changeout;  Surgeon: Thurmon FairMihai Nayana Lenig, MD;  Location: [facility] INVASIVE CV LAB;  Service: Cardiovascular;  Laterality: N/A;  . Cardiac catheterization N/A 06/27/2015    Procedure: Temporary Pacemaker;  Surgeon: Thurmon FairMihai Zacheriah Stumpe, MD;  Location: [facility] INVASIVE CV LAB;  Service: Cardiovascular;  Laterality: N/A;    Outpatient Prescriptions Prior to Visit  Medication Sig Dispense Refill  . acetaminophen (TYLENOL ARTHRITIS PAIN) 650 MG CR tablet Take 1,300 mg by mouth daily as needed for pain.     . brimonidine (ALPHAGAN) 0.2 % ophthalmic solution Place 1 drop into the left eye 2 (  two) times daily.  5  . calcium-vitamin D (OSCAL) 250-125 MG-UNIT per tablet Take 1 tablet by mouth daily.    . clonazePAM (KLONOPIN) 0.5 MG tablet Take 0.5 mg by mouth daily as needed.     . Difluprednate (DUREZOL) 0.05 % EMUL Place 1 drop into the left eye daily.    . dorzolamide-timolol (COSOPT) 22.3-6.8 MG/ML ophthalmic solution Place 2 drops into both eyes 2 (two) times daily.    . furosemide (LASIX) 40 MG tablet TAKE 1 TABLET DAILY. MAY TAKE AN EXTRA DOSE DAILY AS NEEDED FOR WEIGHT GAIN 180 tablet 1  . KLOR-CON M10 10 MEQ tablet  TAKE 2 TABLETS (20 MEQ TOTAL) DAILY 180 tablet 1  . latanoprost (XALATAN) 0.005 % ophthalmic solution Place 1 drop into both eyes at bedtime.    [name] Kitchen losartan (COZAAR) 50 MG tablet TAKE 1 TABLET DAILY 90 tablet 1  . Multiple Vitamins-Minerals (MULTIVITAMIN PO) Take 1 tablet by mouth daily.     . nebivolol (BYSTOLIC) 10 MG tablet Take 1 tablet (10 mg total) by mouth daily. 30 tablet 6  . neomycin-polymyxin b-dexamethasone (MAXITROL) 3.5-10000-0.1 OINT Place 1 application into the left eye 2 (two) times daily.  2  . ofloxacin (OCUFLOX) 0.3 % ophthalmic solution Place 1 drop into the left eye 2 (two) times daily.   0  . pantoprazole (PROTONIX) 40 MG tablet Take 1 tablet (40 mg total) by mouth daily. 30 tablet 11  . pilocarpine (PILOCAR) 1 % ophthalmic solution Place 4 drops into the left eye daily.     . rosuvastatin (CRESTOR) 10 MG tablet Take 10 mg by mouth at bedtime.    [name] Kitchen venlafaxine XR (EFFEXOR-XR) 75 MG 24 hr capsule Take 75 mg by mouth daily.    [name] 20 MG TABS tablet TAKE 1 TABLET BY MOUTH EVERY DAY WITH SUPPER 30 tablet 4   No facility-administered medications prior to visit.     Allergies:   Sulfa antibiotics and Hydrocodone   Social History   Social History  . Marital Status: Widowed    Spouse Name: N/A  . Number of Children: 2  . Years of Education: N/A   Social History Main Topics  . Smoking status: Never Smoker   . Smokeless tobacco: Never Used  . Alcohol Use: No  . Drug Use: No  . Sexual Activity: No   Other Topics Concern  . None   Social History Narrative    ROS:   Please see the history of present illness.    ROS All other systems reviewed and are negative.   PHYSICAL EXAM:   VS:  BP 130/80 mmHg  Pulse 88  Ht  (1.626 m)  Wt 73.029 kg (161 lb)  BMI 27.62 kg/m2  SpO2 98%   GEN: Well nourished, well developed, in no acute distress HEENT: normal Neck: no JVD, carotid bruits, or masses Cardiac: RRR; normal first and paradoxically split second  heart sounds, no rubs or gallops, 1-2/6 decrescendo diastolic murmur at the right and left lower sternal border,no edema  Respiratory:  clear to auscultation bilaterally, normal work of breathing GI: soft, nontender, nondistended, + BS MS: no deformity or atrophy Skin: warm and dry, no rash Neuro:  Alert and Oriented x 3, Strength and sensation are intact Psych: euthymic mood, full affect  Wt Readings from Last 3 Encounters:  06/25/16 73.029 kg (161 lb)  03/25/16 71.668 kg (158 lb)  01/14/16 72.031 kg (158 lb 12.8 oz)      Studies/Labs Reviewed:  EKG:  EKG is not ordered today.    ASSESSMENT:    1. Complete heart block (HCC)   2. Pacemaker   3. Paroxysmal atrial fibrillation (HCC)   4. Chronic diastolic heart failure (HCC)   5. Pulmonary stenosis sp valvotomy   6. Status post patch closure of ASD   7. Long term (current) use of anticoagulants      PLAN:  In order of problems listed above:  1. CHB: Pacemaker dependent, very poorly tolerant of atrial threshold testing. No escape rhythm.  2. PPM: Normal device function, continue remote downloads every 3 months and office visit at least yearly. 3. PAFib: On appropriate anticoagulation. CHADSVasc 4 (age 37, gender, HF). No recent episodes by pacemaker check 4. CHF, diastolic: Maintaining euvolemic on a very low dose of diuretic, rarely needing dose adjustment. 2013, echo reported EF 40% septal dyssynchrony, but nuclear scintigraphy showed normal perfusion and normal LVEF 57%. 5. PS s/p surgical valvotomy: No residual gradient, mild to moderate regurgitation by echo 6. ASD s/p patch closure: No residual shunt. 7. No bleeding complications  Echo also showed mild to moderate mitral insufficiency and mild to moderate aortic insufficiency  Medication Adjustments/Labs and Tests Ordered: Current medicines are reviewed at length with the patient today.  Concerns regarding medicines are outlined above.  Medication changes, Labs and  Tests ordered today are listed in the Patient Instructions below. Patient Instructions  Dr Royann Shiversroitoru recommends that you continue on your current medications as directed. Please refer to the Current Medication list given to you today.  Dr Royann Shiversroitoru recommends that you schedule a follow-up appointment in November 2017. You will receive a reminder letter in the mail two months in advance. If you don't receive a letter, please call our office to schedule the follow-up appointment.  If you need a refill on your cardiac medications before your next appointment, please call your pharmacy.    Signed, Thurmon FairMihai Paras Kreider, MD  06/26/2016 9:32 PM    [facility] [address], [place], [place]  [address] Phone: [phone]; Fax: [phone]

## 2016-06-26 DIAGNOSIS — Z7901 Long term (current) use of anticoagulants: Secondary | ICD-10-CM | POA: Insufficient documentation

## 2016-07-13 ENCOUNTER — Telehealth: Payer: Self-pay

## 2016-07-14 NOTE — Telephone Encounter (Signed)
Request for surgical clearance:   1. What type surgery is being performed? Tooth extraction  2. When is this surgery scheduled? pending  3. Are there any medications that need to be held prior to surgery and how long? Xarelto  4. Name of the physician performing surgery: Graylon Gunning, DDS  5. What is the office phone and fax number?   Phone [phone]  Fax [phone]

## 2016-07-15 ENCOUNTER — Encounter: Payer: Self-pay | Admitting: Cardiovascular Disease

## 2016-07-15 NOTE — Telephone Encounter (Signed)
In EPIC

## 2016-07-20 NOTE — Telephone Encounter (Signed)
Faxed clearance letter to Dr Mendel Corning office [phone].

## 2016-07-30 ENCOUNTER — Ambulatory Visit (INDEPENDENT_AMBULATORY_CARE_PROVIDER_SITE_OTHER): Payer: Medicare Other | Admitting: *Deleted

## 2016-07-30 ENCOUNTER — Telehealth: Payer: Self-pay | Admitting: Cardiology

## 2016-07-30 DIAGNOSIS — Z950 Presence of cardiac pacemaker: Secondary | ICD-10-CM

## 2016-07-30 DIAGNOSIS — I442 Atrioventricular block, complete: Secondary | ICD-10-CM | POA: Diagnosis not present

## 2016-07-30 NOTE — Telephone Encounter (Signed)
LMOV[facility] reminding pt to send remote transmission.   

## 2016-08-03 ENCOUNTER — Telehealth: Payer: Self-pay | Admitting: Cardiovascular Disease

## 2016-08-03 ENCOUNTER — Encounter: Payer: Self-pay | Admitting: Cardiology

## 2016-08-03 LAB — CUP PACEART REMOTE DEVICE CHECK
Battery Impedance: 100 Ohm
Battery Remaining Longevity: 137 mo
Battery Voltage: 2.78 V
Brady Statistic AP VP Percent: 97 %
Brady Statistic AP VS Percent: 1 %
Brady Statistic AS VP Percent: 2 %
Brady Statistic AS VS Percent: 0 %
Date Time Interrogation Session: 20170810200625
Implantable Lead Implant Date: 20070920
Implantable Lead Implant Date: 20070920
Implantable Lead Location: 753859
Implantable Lead Location: 753860
Implantable Lead Model: 4592
Implantable Lead Model: 5092
Lead Channel Impedance Value: 510 Ohm
Lead Channel Impedance Value: 845 Ohm
Lead Channel Pacing Threshold Amplitude: 0.625 V
Lead Channel Pacing Threshold Amplitude: 1.125 V
Lead Channel Pacing Threshold Pulse Width: 0.4 ms
Lead Channel Pacing Threshold Pulse Width: 0.4 ms
Lead Channel Setting Pacing Amplitude: 2 V
Lead Channel Setting Pacing Amplitude: 2.25 V
Lead Channel Setting Pacing Pulse Width: 0.4 ms
Lead Channel Setting Sensing Sensitivity: 5.6 mV

## 2016-08-03 MED ORDER — RIVAROXABAN 20 MG PO TABS: 20.0000 mg | ORAL_TABLET | Freq: Every day | ORAL | 0 refills | Status: DC

## 2016-08-03 NOTE — Progress Notes (Signed)
Remote pacemaker transmission.   

## 2016-08-03 NOTE — Telephone Encounter (Signed)
Returned call to daughter. She states [facility] to [facility] this notification to us - patient on last xarelto pill today.  I have informed caller that I have put samples at front for patient pickup, and that we will start on PA today. She voiced thanks for the call. Aware I will update once prior authorization reviewed/approve by insurance.

## 2016-08-03 NOTE — Telephone Encounter (Signed)
Pt needs prior authorization for her Xarelto please. Please call to [phone].She needs this today if possible please.

## 2016-08-03 NOTE — Telephone Encounter (Signed)
PA submitted   Key: [id number] - PA Case ID: [id number]

## 2016-08-03 NOTE — Telephone Encounter (Signed)
PA Case [id number] is denied. For further questions, call [phone].

## 2016-08-04 ENCOUNTER — Encounter: Payer: Self-pay | Admitting: Cardiology

## 2016-08-04 NOTE — Telephone Encounter (Signed)
New message   What should pt family do about the medication she said that [name] Baird has the information to help pt stay on Xarelto   Pt daughter said she will pay out of pocket for pt to stay on the medication

## 2016-08-04 NOTE — Progress Notes (Signed)
Letter  

## 2016-08-06 ENCOUNTER — Telehealth: Payer: Self-pay | Admitting: Cardiovascular Disease

## 2016-08-06 NOTE — Telephone Encounter (Signed)
Returned call. I went through process of calling [facility] and spoke to several representatives. Ultimately was found that the patient was not on their Rx coverage, hence rationale for denial.  I contacted [facility] and obtained up to date Rx benefit info, she has a separate Part D through [employer].  I contacted [employer] at their provided appeal number.  Spoke to representative for [employer] who authorized approval for date of July 18th 2017 to August 17th 2018. Case approval # [id number]

## 2016-08-06 NOTE — Telephone Encounter (Signed)
New message       Pt cannot afford xarelto.  Can she get a presc for something else cheaper?

## 2016-09-15 ENCOUNTER — Telehealth: Payer: Self-pay | Admitting: Cardiology

## 2016-09-15 NOTE — Telephone Encounter (Signed)
Pt daughter called and stated that pt thinks her heart is out of rhythm. Pt sent a transmission. Informed pt daughter that the transmission was received and that a device tech will review and and call her back. Pt daughter verbalized understanding.

## 2016-09-15 NOTE — Telephone Encounter (Signed)
Agree with your recommendations 

## 2016-09-15 NOTE — Telephone Encounter (Signed)
Remote transmission reviewed. Presenting rhythm: ApVp 103-96 (after walk to bedroom). 1 AF episode x 6hrs 22 mins on 03/25/16. Normal device function.  Spoke to [place] regarding patient's remote. I informed her that patient has not had an episode of AF since 03/25/16. [name] voiced understanding.  [name] states that patient complains that "her chest just doesn't feel right". She says that she has had some mild chest tightness and DOE x 1 week. [name] states that patient may be anxious bc this is around the time that her husband passed away.   [name] also states that patient has had some LE edema, which is resolved HS by Lasix. She says that patient hasn't been limiting her sodium intake. She says that she will make sure that she does better.  I encouraged [name] to continue to monitor patient for sx's and call back if she notices any changes or progression of sx's. [name] voiced understanding.   Will forward information to Dr.Croitoru for any further recommendations.

## 2016-10-07 ENCOUNTER — Other Ambulatory Visit: Payer: Self-pay | Admitting: Cardiovascular Disease

## 2016-10-08 NOTE — Telephone Encounter (Signed)
REFILL 

## 2016-10-11 ENCOUNTER — Other Ambulatory Visit: Payer: Self-pay | Admitting: Cardiovascular Disease

## 2016-10-12 ENCOUNTER — Telehealth: Payer: Self-pay | Admitting: Cardiovascular Disease

## 2016-10-12 NOTE — Telephone Encounter (Signed)
Closed encounter °

## 2016-10-17 ENCOUNTER — Other Ambulatory Visit: Payer: Self-pay | Admitting: Cardiovascular Disease

## 2016-10-19 NOTE — Telephone Encounter (Signed)
Rx(s) sent to pharmacy electronically.  

## 2016-10-27 ENCOUNTER — Encounter: Payer: Self-pay | Admitting: Cardiovascular Disease

## 2016-10-27 ENCOUNTER — Encounter (INDEPENDENT_AMBULATORY_CARE_PROVIDER_SITE_OTHER): Payer: Self-pay

## 2016-10-27 ENCOUNTER — Ambulatory Visit (INDEPENDENT_AMBULATORY_CARE_PROVIDER_SITE_OTHER): Payer: Medicare Other | Admitting: Cardiovascular Disease

## 2016-10-27 VITALS — BP 134/75 | HR 104 | Ht 65.0 in | Wt 160.8 lb

## 2016-10-27 DIAGNOSIS — I442 Atrioventricular block, complete: Secondary | ICD-10-CM

## 2016-10-27 DIAGNOSIS — Z23 Encounter for immunization: Secondary | ICD-10-CM

## 2016-10-27 DIAGNOSIS — Z9889 Other specified postprocedural states: Secondary | ICD-10-CM

## 2016-10-27 DIAGNOSIS — I5032 Chronic diastolic (congestive) heart failure: Secondary | ICD-10-CM

## 2016-10-27 DIAGNOSIS — I484 Atypical atrial flutter: Secondary | ICD-10-CM | POA: Diagnosis not present

## 2016-10-27 DIAGNOSIS — Z8774 Personal history of (corrected) congenital malformations of heart and circulatory system: Secondary | ICD-10-CM

## 2016-10-27 DIAGNOSIS — I370 Nonrheumatic pulmonary valve stenosis: Secondary | ICD-10-CM

## 2016-10-27 DIAGNOSIS — Z950 Presence of cardiac pacemaker: Secondary | ICD-10-CM

## 2016-10-27 DIAGNOSIS — Z7901 Long term (current) use of anticoagulants: Secondary | ICD-10-CM

## 2016-10-27 MED ORDER — RIVAROXABAN 20 MG PO TABS: 20.0000 mg | ORAL_TABLET | Freq: Every day | ORAL | 3 refills | Status: DC

## 2016-10-27 MED ORDER — NEBIVOLOL HCL 10 MG PO TABS: 10.0000 mg | ORAL_TABLET | Freq: Every day | ORAL | 3 refills | Status: DC

## 2016-10-27 NOTE — Progress Notes (Signed)
ROS  Patient ID: [name] Baird, female   DOB: [birth year], 82 y.o.   MRN: [id number]    Cardiology Office Note    Date:  10/27/2016   ID:  [name] Baird, DOB Oct 09, 1933, MRN [id number]  PCP:  Tomi Bamberger, NP  Cardiologist:   Thurmon Fair, MD   Chief Complaint  Patient presents with  . Follow-up    pt denied chest pain  and SOB    History of Present Illness:  [name] Baird is a 82 y.o. female with a history of congenital heart disease (atrial septal defect status post patch closure, pulmonic stenosis status post valvotomy), complete heart block status post dual-chamber permanent pacemaker implantation and recurrent problems with paroxysmal atrial flutter and atrial fibrillation on Xarelto. She has history of diastolic heart failure without recent exacerbation.  She generally feels well. She gets anxious more often and worries that her heart rhythm has been abnormal. He had been a couple of ad hoc downloads that showed normal rhythm. She has 2 sutures holding together her upper and lower left eyelid at the external corner, to avoid progression of corneal disease. She infrequently takes an extra dose of diuretic for leg edema, no more than twice a month.  Pacemaker interrogation today shows normal device function. She has 98% atrial pacing and 99% ventricular pacing. No significant episodes of atrial or ventricular arrhythmias have been detected since April when she had 6-1/2 hours of atrial fibrillation. Lead parameters are excellent. She is pacemaker dependent without underlying ventricular escape rhythm. She is quite symptomatic and we do ventricular lead threshold testing.  She had a flutter ablation performed by Dr. Johney Frame in July 2014 and is not taking any antiarrhythmics at this time, other than a low dose of beta blocker. At the time of the EP study she was noted to have conventional counterclockwise isthmus-dependent right atrial flutter but then a second type of right atrial  flutter was noted briefly. It could not be reinduced for full study.    Past Medical History:  Diagnosis Date  . Anxiety   . Atrial flutter (HCC)    s/p ablation 06/29/2013 by Dr Johney Frame  . CHF (congestive heart failure) (HCC)   . Complete heart block (HCC) 2007   s/p medtronic pacemaker implantation  . Depression   . Glaucoma   . Hypertension   . Pulmonary stenosis sp valvotomy       . Shortness of breath   . Status post patch closure of ASD     Past Surgical History:  Procedure Laterality Date  . ABLATION  06/29/2013   s/p isthmus ablation for atrial flutter by Dr Johney Frame  . AORTIC VALVE REPAIR    . ATRIAL FLUTTER ABLATION N/A 06/29/2013   Procedure: ATRIAL FLUTTER ABLATION;  Surgeon: Hillis Range, MD;  Location: [facility] CATH LAB;  Service: Cardiovascular;  Laterality: N/A;  . CARDIAC CATHETERIZATION N/A 06/27/2015   Procedure: Temporary Pacemaker;  Surgeon: Thurmon Fair, MD;  Location: [facility] INVASIVE CV LAB;  Service: Cardiovascular;  Laterality: N/A;  . CARDIOVERSION N/A 03/24/2013   Procedure: CARDIOVERSION;  Surgeon: Thurmon Fair, MD;  Location: [facility] ENDOSCOPY;  Service: Cardiovascular;  Laterality: N/A;  . EP IMPLANTABLE DEVICE N/A 06/27/2015   Procedure: PPM Generator Changeout;  Surgeon: Thurmon Fair, MD;  Location: [facility] INVASIVE CV LAB;  Service: Cardiovascular;  Laterality: N/A;  . PACEMAKER INSERTION  2007   Medtronic Adapta ADDRL1, serial # [id number]  . pulmonary valvotomy  1985  . trigeminal sx  left side    Outpatient Medications Prior to Visit  Medication Sig Dispense Refill  . acetaminophen (TYLENOL ARTHRITIS PAIN) 650 MG CR tablet Take 1,300 mg by mouth daily as needed for pain.     . bacitracin ophthalmic ointment Place 1 application into the left eye 4 (four) times daily.  1  . brimonidine (ALPHAGAN) 0.2 % ophthalmic solution Place 1 drop into the left eye 2 (two) times daily.  5  . calcium-vitamin D (OSCAL) 250-125 MG-UNIT per tablet Take 1 tablet by mouth daily.     . clonazePAM (KLONOPIN) 0.5 MG tablet Take 0.5 mg by mouth daily as needed.     . dorzolamide-timolol (COSOPT) 22.3-6.8 MG/ML ophthalmic solution Place 2 drops into both eyes 2 (two) times daily.    . furosemide (LASIX) 40 MG tablet Take 1 tablet (40 mg total) by mouth daily. May take an extra dose daily as needed for weight gain. 180 tablet 2  . latanoprost (XALATAN) 0.005 % ophthalmic solution Place 1 drop into both eyes at bedtime.    [name] Kitchen losartan (COZAAR) 50 MG tablet TAKE 1 TABLET DAILY 90 tablet 3  . Multiple Vitamins-Minerals (MULTIVITAMIN PO) Take 1 tablet by mouth daily.     [name] Kitchen ofloxacin (OCUFLOX) 0.3 % ophthalmic solution Place 1 drop into the left eye 2 (two) times daily.   0  . pantoprazole (PROTONIX) 40 MG tablet Take 1 tablet (40 mg total) by mouth daily. 30 tablet 11  . potassium chloride (K-DUR,KLOR-CON) 10 MEQ tablet Take 2 tablets (20 mEq total) by mouth daily. 180 tablet 3  . rosuvastatin (CRESTOR) 10 MG tablet Take 10 mg by mouth at bedtime.    [name] Kitchen venlafaxine XR (EFFEXOR-XR) 75 MG 24 hr capsule Take 75 mg by mouth daily.    [name] Kitchen VIGAMOX 0.5 % ophthalmic solution Place 1 application into the left eye 4 (four) times daily.    [name] Kitchen BYSTOLIC 10 MG tablet TAKE 1 TABLET BY MOUTH EVERY DAY 30 tablet 6  . pilocarpine (PILOCAR) 1 % ophthalmic solution Place 4 drops into the left eye daily.     . rivaroxaban (XARELTO) 20 MG TABS tablet Take 1 tablet (20 mg total) by mouth daily with supper. 14 tablet 0  . Difluprednate (DUREZOL) 0.05 % EMUL Place 1 drop into the left eye daily.    [name] Kitchen neomycin-polymyxin b-dexamethasone (MAXITROL) 3.5-10000-0.1 OINT Place 1 application into the left eye 2 (two) times daily.  2   No facility-administered medications prior to visit.      Allergies:   Sulfa antibiotics and Hydrocodone   Social History   Social History  . Marital status: Widowed    Spouse name: N/A  . Number of children: 2  . Years of education: N/A   Social History Main Topics  . Smoking  status: Never Smoker  . Smokeless tobacco: Never Used  . Alcohol use No  . Drug use: No  . Sexual activity: No   Other Topics Concern  . Not on file   Social History Narrative  . No narrative on file    ROS:   Please see the history of present illness.    ROS All other systems reviewed and are negative.   PHYSICAL EXAM:   VS:  BP 134/75   Pulse (!) 104   Ht 5\' 5"  (1.651 m)   Wt 160 lb 12.8 oz (72.9 kg)   BMI 26.76 kg/m    GEN: Well nourished, well developed, in no acute distress  HEENT: normal  Neck: no JVD, carotid bruits, or masses Cardiac: RRR; normal first and paradoxically split second heart sounds, no rubs or gallops, 1-2/6 decrescendo diastolic murmur at the right and left lower sternal border,no edema  Respiratory:  clear to auscultation bilaterally, normal work of breathing GI: soft, nontender, nondistended, + BS MS: no deformity or atrophy  Skin: warm and dry, no rash Neuro:  Alert and Oriented x 3, Strength and sensation are intact Psych: euthymic mood, full affect  Wt Readings from Last 3 Encounters:  10/27/16 160 lb 12.8 oz (72.9 kg)  06/25/16 161 lb (73 kg)  03/25/16 158 lb (71.7 kg)      Studies/Labs Reviewed:   EKG:  EKG is ordered today.  Shows 100% atrial paced ventricular paced rhythm. QTC 570 ms  ASSESSMENT:    1. Complete heart block (HCC)   2. Pacemaker   3. Atypical atrial flutter (HCC)   4. Chronic diastolic heart failure (HCC)   5. Nonrheumatic pulmonary valve stenosis   6. Status post patch closure of ASD   7. Long term current use of anticoagulant therapy   8. Encounter for immunization      PLAN:  In order of problems listed above:  1. CHB: Pacemaker dependent, very poorly tolerant of threshold testing. No escape rhythm.  2. PPM: Normal device function, continue remote downloads every 3 months and office visit at least yearly. 3. PAFlutter/ Afib: On appropriate anticoagulation. No recent episodes by pacemaker check After the  6-1/2 hour episode that occurred in April 4. CHF, diastolic: Maintaining euvolemia on a very low dose of diuretic, rarely needing dose adjustment. 2013, echo reported EF 40% septal dyssynchrony, but nuclear scintigraphy showed normal perfusion and normal LVEF 57%. Echo also showed mild to moderate mitral insufficiency and mild to moderate aortic insufficiency 5. PS s/p surgical valvotomy: No residual gradient, mild to moderate regurgitation by echo 6. ASD s/p patch closure: No residual shunt. 7. Xarelto: No bleeding complications. CHADSVasc 4 (age 67, gender, HF) 8. Flu shot administered today at patient's request    Medication Adjustments/Labs and Tests Ordered: Current medicines are reviewed at length with the patient today.  Concerns regarding medicines are outlined above.  Medication changes, Labs and Tests ordered today are listed in the Patient Instructions below. Patient Instructions  Dr Royann Shiversroitoru recommends that you continue on your current medications as directed. Please refer to the Current Medication list given to you today.  Remote monitoring is used to monitor your Pacemaker of ICD from home. This monitoring reduces the number of office visits required to check your device to one time per year. It allows us to keep an eye on the functioning of your device to ensure it is working properly. You are scheduled for a device check from home on Tuesday, February 6th, 2018. You may send your transmission at any time that day. If you have a wireless device, the transmission will be sent automatically. After your physician reviews your transmission, you will receive a postcard with your next transmission date.  Dr Royann Shiversroitoru recommends that you schedule a follow-up appointment in 12 months with a pacemaker check. You will receive a reminder letter in the mail two months in advance. If you don't receive a letter, please call our office to schedule the follow-up appointment.  If you need a refill on  your cardiac medications before your next appointment, please call your pharmacy.   Signed, Thurmon FairMihai Aaron Boeh, MD  10/27/2016 3:13 PM    [facility] [phone]  [address], [place], [place]  [address] Phone: [phone]) [phone]; Fax: [phone](336) [phone]

## 2016-10-27 NOTE — Patient Instructions (Signed)
Dr Croitoru recommends that you continue on your current medications as directed. Please refer to the Current Medication list given to you today.  Remote monitoring is used to monitor your Pacemaker of ICD from home. This monitoring reduces the number of office visits required to check your device to one time per year. It allows us to keep an eye on the functioning of your device to ensure it is working properly. You are scheduled for a device check from home on Tuesday, February 6th, 2018. You may send your transmission at any time that day. If you have a wireless device, the transmission will be sent automatically. After your physician reviews your transmission, you will receive a postcard with your next transmission date.  Dr Croitoru recommends that you schedule a follow-up appointment in 12 months with a pacemaker check. You will receive a reminder letter in the mail two months in advance. If you don't receive a letter, please call our office to schedule the follow-up appointment.  If you need a refill on your cardiac medications before your next appointment, please call your pharmacy. 

## 2016-10-28 ENCOUNTER — Encounter: Payer: Medicare Other | Admitting: Cardiovascular Disease

## 2017-01-09 LAB — CUP PACEART INCLINIC DEVICE CHECK
Battery Impedance: 112 Ohm
Battery Remaining Longevity: 117 mo
Battery Voltage: 2.78 V
Brady Statistic AP VP Percent: 97 %
Brady Statistic AP VS Percent: 1 %
Brady Statistic AS VP Percent: 2 %
Brady Statistic AS VS Percent: 0 %
Date Time Interrogation Session: 20171107163646
Implantable Lead Implant Date: 20070920
Implantable Lead Implant Date: 20070920
Implantable Lead Location: 753859
Implantable Lead Location: 753860
Implantable Lead Model: 4592
Implantable Lead Model: 5092
Implantable Pulse Generator Implant Date: 20160707
Lead Channel Impedance Value: 503 Ohm
Lead Channel Impedance Value: 820 Ohm
Lead Channel Pacing Threshold Amplitude: 0.625 V
Lead Channel Pacing Threshold Amplitude: 1.375 V
Lead Channel Pacing Threshold Pulse Width: 0.4 ms
Lead Channel Pacing Threshold Pulse Width: 0.4 ms
Lead Channel Setting Pacing Amplitude: 2 V
Lead Channel Setting Pacing Amplitude: 2.75 V
Lead Channel Setting Pacing Pulse Width: 0.4 ms
Lead Channel Setting Sensing Sensitivity: 5.6 mV

## 2017-01-26 ENCOUNTER — Telehealth: Payer: Self-pay | Admitting: Cardiology

## 2017-01-26 ENCOUNTER — Ambulatory Visit (INDEPENDENT_AMBULATORY_CARE_PROVIDER_SITE_OTHER): Payer: Medicare Other | Admitting: *Deleted

## 2017-01-26 DIAGNOSIS — I442 Atrioventricular block, complete: Secondary | ICD-10-CM

## 2017-01-26 NOTE — Telephone Encounter (Signed)
LMOV[facility] reminding pt to send remote transmission.   

## 2017-01-27 LAB — CUP PACEART REMOTE DEVICE CHECK
Battery Impedance: 135 Ohm
Battery Remaining Longevity: 128 mo
Battery Voltage: 2.78 V
Brady Statistic AP VP Percent: 95 %
Brady Statistic AP VS Percent: 1 %
Brady Statistic AS VP Percent: 4 %
Brady Statistic AS VS Percent: 0 %
Date Time Interrogation Session: 20180206234613
Implantable Lead Implant Date: 20070920
Implantable Lead Implant Date: 20070920
Implantable Lead Location: 753859
Implantable Lead Location: 753860
Implantable Lead Model: 4592
Implantable Lead Model: 5092
Implantable Pulse Generator Implant Date: 20160707
Lead Channel Impedance Value: 518 Ohm
Lead Channel Impedance Value: 899 Ohm
Lead Channel Pacing Threshold Amplitude: 0.625 V
Lead Channel Pacing Threshold Amplitude: 1.125 V
Lead Channel Pacing Threshold Pulse Width: 0.4 ms
Lead Channel Pacing Threshold Pulse Width: 0.4 ms
Lead Channel Setting Pacing Amplitude: 2 V
Lead Channel Setting Pacing Amplitude: 2.25 V
Lead Channel Setting Pacing Pulse Width: 0.4 ms
Lead Channel Setting Sensing Sensitivity: 5.6 mV

## 2017-01-27 NOTE — Progress Notes (Signed)
Remote pacemaker transmission.   

## 2017-01-29 ENCOUNTER — Encounter: Payer: Self-pay | Admitting: Cardiology

## 2017-02-12 ENCOUNTER — Other Ambulatory Visit: Payer: Self-pay | Admitting: Internal Medicine

## 2017-02-12 ENCOUNTER — Other Ambulatory Visit: Payer: Self-pay | Admitting: Nurse Practitioner

## 2017-02-12 DIAGNOSIS — Z1231 Encounter for screening mammogram for malignant neoplasm of breast: Secondary | ICD-10-CM

## 2017-03-03 ENCOUNTER — Encounter: Payer: Self-pay | Admitting: Podiatry

## 2017-03-03 ENCOUNTER — Ambulatory Visit (INDEPENDENT_AMBULATORY_CARE_PROVIDER_SITE_OTHER): Payer: Medicare Other

## 2017-03-03 ENCOUNTER — Ambulatory Visit (INDEPENDENT_AMBULATORY_CARE_PROVIDER_SITE_OTHER): Payer: Medicare Other | Admitting: Podiatry

## 2017-03-03 VITALS — BP 115/78 | HR 92 | Resp 16 | Ht 62.0 in | Wt 158.0 lb

## 2017-03-03 DIAGNOSIS — M2041 Other hammer toe(s) (acquired), right foot: Secondary | ICD-10-CM | POA: Diagnosis not present

## 2017-03-03 DIAGNOSIS — L84 Corns and callosities: Secondary | ICD-10-CM | POA: Diagnosis not present

## 2017-03-03 DIAGNOSIS — M779 Enthesopathy, unspecified: Secondary | ICD-10-CM

## 2017-03-03 MED ORDER — TRIAMCINOLONE ACETONIDE 10 MG/ML IJ SUSP: 10.0000 mg | Freq: Once | INTRAMUSCULAR | Status: DC

## 2017-03-03 NOTE — Progress Notes (Signed)
   Subjective:    Patient ID: [name] Baird, female    DOB: 03/19/1933, 83 y.o.   MRN: [id number]  HPI Chief Complaint  Patient presents with  . Painful lesion    Right foot; 5th toe-lateral side; pt stated, "Has had for the past year"      Review of Systems  All other systems reviewed and are negative.      Objective:   Physical Exam        Assessment & Plan:

## 2017-03-03 NOTE — Progress Notes (Signed)
Subjective:     Patient ID: [name] Baird, female   DOB: 08-28-1933, 83 y.o.   MRN: [id number]  HPI patient presents with a painful right fifth digit with lesion formation. States it's been going on for around a year and that she's tried wider shoes and padding without relief and she presents with caregiver today   Review of Systems  All other systems reviewed and are negative.      Objective:   Physical Exam  Constitutional: She is oriented to person, place, and time.  Cardiovascular: Intact distal pulses.   Musculoskeletal: Normal range of motion.  Neurological: She is oriented to person, place, and time.  Skin: Skin is warm.  Nursing note and vitals reviewed.  Neurovascular status intact muscle strength adequate range of motion within normal limits with patient found to have a very painful interphalangeal joint right fifth toe with keratotic lesion and inability to wear shoe gear comfortably. Patient also has excellent digital perfusion and is well oriented 3     Assessment:     Inflammatory lesion fifth digit right with fluid buildup and inflammatory capsulitis around the joint along with hammertoe deformity    Plan:     H&P x-ray reviewed all conditions discussed. At this time I did a proximal nerve block of the fifth digit and I carefully injected around and into the interphalangeal joint 1 mg dexamethasone 1 mg Kenalog and did deep debridement of lesion with padding. I instructed on wider shoes and reappoint to recheck  X-ray report indicates that the fifth digit right on the head of the proximal phalanx shows a spur formation

## 2017-03-09 ENCOUNTER — Ambulatory Visit
Admission: RE | Admit: 2017-03-09 | Discharge: 2017-03-09 | Disposition: A | Discharge status: Home / Self Care | Payer: Medicare Other | Source: Ambulatory Visit | Attending: Nurse Practitioner | Admitting: Nurse Practitioner

## 2017-03-09 DIAGNOSIS — Z1231 Encounter for screening mammogram for malignant neoplasm of breast: Secondary | ICD-10-CM

## 2017-03-15 ENCOUNTER — Other Ambulatory Visit: Payer: Self-pay | Admitting: *Deleted

## 2017-03-15 ENCOUNTER — Other Ambulatory Visit: Payer: Self-pay | Admitting: Internal Medicine

## 2017-03-15 DIAGNOSIS — R928 Other abnormal and inconclusive findings on diagnostic imaging of breast: Secondary | ICD-10-CM

## 2017-03-17 ENCOUNTER — Other Ambulatory Visit: Payer: Self-pay | Admitting: Internal Medicine

## 2017-03-17 ENCOUNTER — Other Ambulatory Visit: Payer: Self-pay | Admitting: Cardiovascular Disease

## 2017-03-17 ENCOUNTER — Other Ambulatory Visit: Payer: Medicare Other

## 2017-03-17 ENCOUNTER — Inpatient Hospital Stay: Admission: RE | Admit: 2017-03-17 | Payer: Medicare Other | Source: Ambulatory Visit

## 2017-03-17 ENCOUNTER — Other Ambulatory Visit: Payer: Self-pay | Admitting: Adult Health

## 2017-03-17 ENCOUNTER — Telehealth: Payer: Self-pay | Admitting: Cardiovascular Disease

## 2017-03-17 ENCOUNTER — Ambulatory Visit
Admission: RE | Admit: 2017-03-17 | Discharge: 2017-03-17 | Disposition: A | Discharge status: Home / Self Care | Payer: Medicare Other | Source: Ambulatory Visit | Attending: Internal Medicine | Admitting: Internal Medicine

## 2017-03-17 ENCOUNTER — Ambulatory Visit: Payer: Medicare Other

## 2017-03-17 DIAGNOSIS — R928 Other abnormal and inconclusive findings on diagnostic imaging of breast: Secondary | ICD-10-CM

## 2017-03-17 NOTE — Telephone Encounter (Signed)
New message   Patient PCP suddenly closed it's office.   Patient needs to have a double core biopsy of left breast .   Asking if Dr.Croitoru could do an verbal on patient . Fax # [phone]. Patient has appt today @ 2:45 pm   Direct # [phone].  Second option is patient will have to go to [address] - her appt at [facility] center will not be until Friday 3.30.18

## 2017-03-17 NOTE — Telephone Encounter (Signed)
Follow up   Pt daughter is calling to follow up.

## 2017-03-17 NOTE — Telephone Encounter (Signed)
Spoke to daughter , informed her information has not been routed to Dr C.    will contact her with the answer.   Daughter verbalized understanding. She states she called Tomi BambergerSusan Fuller NP 'S OFFICE - office has been closed after they received letter in regards to having the biopsy.   Defer to DR Croitoru

## 2017-03-18 NOTE — Telephone Encounter (Signed)
Sorry, I declined the biopsy order because I thought they were in error (was entered 5 times). Then, I read this message. I can sign that order if you could put it back in, please. Thanks, GoogleMCr

## 2017-03-18 NOTE — Telephone Encounter (Signed)
SPOKE TO cathy. Informed cathy - Dr Royann Shiversroitoru will signed order- She states she will re-enter and contact daughter.

## 2017-03-19 ENCOUNTER — Ambulatory Visit
Admission: RE | Admit: 2017-03-19 | Discharge: 2017-03-19 | Disposition: A | Discharge status: Home / Self Care | Payer: Medicare Other | Source: Ambulatory Visit | Attending: Cardiovascular Disease | Admitting: Cardiovascular Disease

## 2017-03-19 ENCOUNTER — Other Ambulatory Visit: Payer: Self-pay | Admitting: Cardiovascular Disease

## 2017-03-19 DIAGNOSIS — R928 Other abnormal and inconclusive findings on diagnostic imaging of breast: Secondary | ICD-10-CM

## 2017-03-22 ENCOUNTER — Other Ambulatory Visit: Payer: Medicare Other

## 2017-03-31 ENCOUNTER — Other Ambulatory Visit: Payer: Self-pay

## 2017-03-31 MED ORDER — PANTOPRAZOLE SODIUM 40 MG PO TBEC: 40.0000 mg | DELAYED_RELEASE_TABLET | Freq: Every day | ORAL | 7 refills | Status: DC

## 2017-03-31 NOTE — Telephone Encounter (Signed)
Rx(s) sent to pharmacy electronically.  

## 2017-04-02 ENCOUNTER — Telehealth: Payer: Self-pay | Admitting: Cardiology

## 2017-04-02 NOTE — Telephone Encounter (Signed)
Pt daughter called and stated that pt has not been feeling well. PT has no dizziness, some shortness of breath, just weak and fatigue every night around 5:30 - 6:00 PM.

## 2017-04-05 NOTE — Telephone Encounter (Signed)
Spoke with [name] informed her that the transmission was reviewed and there were no events that correlated with times that pt states she experiences weakness and fatigue. Ms [name] Baird appreciative of call back and relieved that the weakness and fatigue does not correlate to episodes.

## 2017-04-08 ENCOUNTER — Telehealth: Payer: Self-pay | Admitting: Cardiovascular Disease

## 2017-04-08 NOTE — Telephone Encounter (Signed)
[name] Baird from [facility] at [phone], requesting a prior auth on Bystolic 10 mg tablet.  [name] Baird would like a call back concerning this matter. Thanks

## 2017-04-13 ENCOUNTER — Telehealth: Payer: Self-pay | Admitting: *Deleted

## 2017-04-14 NOTE — Telephone Encounter (Signed)
Medication samples have been provided to the patient. Drug name: Bystolic 10 mg Qty: 56 tablets LOT: [id number] Exp.Date: 10/2017 Samples left at front desk for patient pick-up. Patient notified.  T[place]itt, Chelley 5:13 PM 04/14/2017   Harvie Heck provided # for appeal [id number].

## 2017-04-14 NOTE — Telephone Encounter (Signed)
[name] Baird (Son ) is calling about his mother medication

## 2017-04-16 NOTE — Telephone Encounter (Signed)
[name] Baird, CMA, Dr. Erin Hearing assistant has already addressed this matter.

## 2017-04-27 ENCOUNTER — Ambulatory Visit (INDEPENDENT_AMBULATORY_CARE_PROVIDER_SITE_OTHER): Payer: Medicare Other | Admitting: *Deleted

## 2017-04-27 DIAGNOSIS — I442 Atrioventricular block, complete: Secondary | ICD-10-CM

## 2017-04-27 NOTE — Progress Notes (Signed)
Remote pacemaker transmission.   

## 2017-04-29 LAB — CUP PACEART REMOTE DEVICE CHECK
Battery Impedance: 135 Ohm
Battery Remaining Longevity: 118 mo
Battery Voltage: 2.78 V
Brady Statistic AP VP Percent: 95 %
Brady Statistic AP VS Percent: 1 %
Brady Statistic AS VP Percent: 4 %
Brady Statistic AS VS Percent: 0 %
Date Time Interrogation Session: 20180508103112
Implantable Lead Implant Date: 20070920
Implantable Lead Implant Date: 20070920
Implantable Lead Location: 753859
Implantable Lead Location: 753860
Implantable Lead Model: 4592
Implantable Lead Model: 5092
Implantable Pulse Generator Implant Date: 20160707
Lead Channel Impedance Value: 503 Ohm
Lead Channel Impedance Value: 863 Ohm
Lead Channel Pacing Threshold Amplitude: 0.625 V
Lead Channel Pacing Threshold Amplitude: 1.25 V
Lead Channel Pacing Threshold Pulse Width: 0.4 ms
Lead Channel Pacing Threshold Pulse Width: 0.4 ms
Lead Channel Setting Pacing Amplitude: 2 V
Lead Channel Setting Pacing Amplitude: 2.5 V
Lead Channel Setting Pacing Pulse Width: 0.4 ms
Lead Channel Setting Sensing Sensitivity: 5.6 mV

## 2017-04-30 ENCOUNTER — Telehealth: Payer: Self-pay

## 2017-04-30 NOTE — Telephone Encounter (Signed)
Spoke with patient's daughter regarding starting the coreg d/t insurance issues with they bystolic. She reports that they were able to resolve these issues and she will maintain the Bystolic at this time.

## 2017-06-22 ENCOUNTER — Other Ambulatory Visit: Payer: Self-pay | Admitting: Cardiovascular Disease

## 2017-07-17 ENCOUNTER — Other Ambulatory Visit: Payer: Self-pay | Admitting: Cardiovascular Disease

## 2017-07-27 ENCOUNTER — Ambulatory Visit (INDEPENDENT_AMBULATORY_CARE_PROVIDER_SITE_OTHER): Payer: Medicare Other | Admitting: *Deleted

## 2017-07-27 DIAGNOSIS — I442 Atrioventricular block, complete: Secondary | ICD-10-CM | POA: Diagnosis not present

## 2017-07-27 NOTE — Progress Notes (Signed)
Remote pacemaker transmission.   

## 2017-07-28 ENCOUNTER — Encounter: Payer: Self-pay | Admitting: Cardiology

## 2017-08-06 ENCOUNTER — Other Ambulatory Visit: Payer: Self-pay | Admitting: Adult Health

## 2017-08-06 DIAGNOSIS — N6320 Unspecified lump in the left breast, unspecified quadrant: Secondary | ICD-10-CM

## 2017-08-11 ENCOUNTER — Other Ambulatory Visit: Payer: Self-pay

## 2017-08-12 ENCOUNTER — Telehealth: Payer: Self-pay | Admitting: Cardiovascular Disease

## 2017-08-12 NOTE — Telephone Encounter (Signed)
[name] called pt need a re fill on XARELTO  20 mg   Pt has 1 pill left  Pharm asked that pt call.  Pt's daughter has question of can she get samples  And or discount card?     Please give her call back.

## 2017-08-12 NOTE — Telephone Encounter (Signed)
Contacted Patients daughter([name] Lovings), and notify her we do have samples of Xarelto 20 mg and it will be at the front desk ready for pick up. [name] let me know her Husband Arma Heading) will be picking up samples tomorrow(08/13/17)

## 2017-08-13 LAB — CUP PACEART REMOTE DEVICE CHECK
Battery Impedance: 160 Ohm
Battery Remaining Longevity: 105 mo
Battery Voltage: 2.78 V
Brady Statistic AP VP Percent: 95 %
Brady Statistic AP VS Percent: 1 %
Brady Statistic AS VP Percent: 4 %
Brady Statistic AS VS Percent: 0 %
Date Time Interrogation Session: 20180807094910
Implantable Lead Implant Date: 20070920
Implantable Lead Implant Date: 20070920
Implantable Lead Location: 753859
Implantable Lead Location: 753860
Implantable Lead Model: 4592
Implantable Lead Model: 5092
Implantable Pulse Generator Implant Date: 20160707
Lead Channel Impedance Value: 488 Ohm
Lead Channel Impedance Value: 752 Ohm
Lead Channel Pacing Threshold Amplitude: 0.75 V
Lead Channel Pacing Threshold Amplitude: 1.375 V
Lead Channel Pacing Threshold Pulse Width: 0.4 ms
Lead Channel Pacing Threshold Pulse Width: 0.4 ms
Lead Channel Setting Pacing Amplitude: 2 V
Lead Channel Setting Pacing Amplitude: 2.75 V
Lead Channel Setting Pacing Pulse Width: 0.4 ms
Lead Channel Setting Sensing Sensitivity: 5.6 mV

## 2017-08-18 ENCOUNTER — Telehealth: Payer: Self-pay | Admitting: Cardiovascular Disease

## 2017-08-18 DIAGNOSIS — E785 Hyperlipidemia, unspecified: Secondary | ICD-10-CM

## 2017-08-18 DIAGNOSIS — Z7901 Long term (current) use of anticoagulants: Secondary | ICD-10-CM

## 2017-08-18 DIAGNOSIS — Z79899 Other long term (current) drug therapy: Secondary | ICD-10-CM

## 2017-08-18 NOTE — Telephone Encounter (Signed)
New message    [facility] at [facility] is calling about prior authorization for the pt. For her xarelto. Please call.

## 2017-08-25 NOTE — Telephone Encounter (Signed)
Follow Up    Patient's daughter calling to follow up on authorization. Still hasn't heard anything and [facility] recommended she call back. She doesn't want to run out of samples. Requesting call back

## 2017-08-25 NOTE — Telephone Encounter (Signed)
Prior authorization for Xarelto 20 mg faxed to patient's insurance company via [email or web address].  Prior authorization for Xarelto has been approved through 08/25/17. Pharmacy notified - pharmacy will not fill, need new CrClearance. Labs in epic >81 yrs old, no metabolic panel in care everywhere. Called daughter, [name], spoke with her husband, Harvie HeckRandy (okay per DPR). Patient has new PCP and had labs done this year. Called [facility] to [facility], Marletta LorJulie Barr, NP office. Patient only has CBC. Discussed with MCr - CMET and lipid panel.  Returned call to [place]. Notified of recommendations. Will attempt to bring patient in tomorrow for labs - at the latest Monday.   Labs ordered and placed in file for [facility].

## 2017-08-25 NOTE — Telephone Encounter (Signed)
Returned call and advised message would be sent to primary nurse for PA to be completed.    Daughter aware and verbalized understanding.  Patient has samples at current.  Daughter request call once completed.

## 2017-08-26 LAB — COMPREHENSIVE METABOLIC PANEL
ALT: 12 IU/L (ref 0–32)
AST: 22 IU/L (ref 0–40)
Albumin/Globulin Ratio: 1.8 (ref 1.2–2.2)
Albumin: 4.5 g/dL (ref 3.5–4.7)
Alkaline Phosphatase: 80 IU/L (ref 39–117)
BUN/Creatinine Ratio: 15 (ref 12–28)
BUN: 14 mg/dL (ref 8–27)
Bilirubin Total: 1 mg/dL (ref 0.0–1.2)
CO2: 24 mmol/L (ref 20–29)
Calcium: 10 mg/dL (ref 8.7–10.3)
Chloride: 104 mmol/L (ref 96–106)
Creatinine, Ser: 0.96 mg/dL (ref 0.57–1.00)
GFR calc Af Amer: 64 mL/min/{1.73_m2} (ref >59)
GFR calc non Af Amer: 55 mL/min/{1.73_m2} — ABNORMAL LOW (ref >59)
Globulin, Total: 2.5 g/dL (ref 1.5–4.5)
Glucose: 105 mg/dL — ABNORMAL HIGH (ref 65–99)
Potassium: 5 mmol/L (ref 3.5–5.2)
Sodium: 142 mmol/L (ref 134–144)
Total Protein: 7 g/dL (ref 6.0–8.5)

## 2017-08-26 LAB — LIPID PANEL
Chol/HDL Ratio: 1.9 ratio (ref 0.0–4.4)
Cholesterol, Total: 116 mg/dL (ref 100–199)
HDL: 61 mg/dL (ref >39)
LDL Calculated: 36 mg/dL (ref 0–99)
Triglycerides: 93 mg/dL (ref 0–149)
VLDL Cholesterol Cal: 19 mg/dL (ref 5–40)

## 2017-08-27 ENCOUNTER — Telehealth: Payer: Self-pay | Admitting: *Deleted

## 2017-08-27 NOTE — Telephone Encounter (Signed)
Patient's daughter in law, [place], has been made aware to hold Xarelto 2 nights prior to a tooth extraction and then restart the night of the extraction, per Dr. Royann Shiversroitoru. The appointment to have the tooth extracted has not been set yet. The daughter in law wanted to know the instructions prior to setting the appointment. She verbalized her appreciation.

## 2017-08-30 ENCOUNTER — Ambulatory Visit
Admission: RE | Admit: 2017-08-30 | Discharge: 2017-08-30 | Disposition: A | Discharge status: Home / Self Care | Payer: Medicare Other | Source: Ambulatory Visit | Attending: Adult Health | Admitting: Adult Health

## 2017-08-30 DIAGNOSIS — N6320 Unspecified lump in the left breast, unspecified quadrant: Secondary | ICD-10-CM

## 2017-08-31 ENCOUNTER — Other Ambulatory Visit: Payer: Self-pay

## 2017-08-31 MED ORDER — RIVAROXABAN 20 MG PO TABS: 20.0000 mg | ORAL_TABLET | Freq: Every day | ORAL | 1 refills | Status: DC

## 2017-09-27 ENCOUNTER — Telehealth: Payer: Self-pay | Admitting: Cardiovascular Disease

## 2017-09-27 NOTE — Telephone Encounter (Signed)
New Message     Call came through answering service 10-6/18 840a    If Home Health RN is calling please get Coumadin Nurse on the phone STAT  1.  Are you calling in regards to an appointment?  no  2.  Are you calling for a refill ? no  3.  Are you having bleeding issues? Yes - patient has been having bad bleeding in tooth and clotting , wants to stop blood thinner   4.  Do you need clearance to hold Coumadin?  no   Please route to the Coumadin Clinic Pool

## 2017-09-27 NOTE — Telephone Encounter (Signed)
Spoke with daughter - this problem occurred late last week after having had some dental extractions on Monday.  They held Xarelto on Friday, restarted Saturday and patient now doing well.  Daughter was concerned about safety of holding.   Assured her that the one missed dose was fine and patient doing well.

## 2017-10-08 ENCOUNTER — Telehealth: Payer: Self-pay | Admitting: Cardiovascular Disease

## 2017-10-08 NOTE — Telephone Encounter (Signed)
Go ahead and check CBC and BMET please. MCr

## 2017-10-08 NOTE — Telephone Encounter (Signed)
°  New Prob  Pt c/o BP issue: STAT if pt c/o blurred vision, one-sided weakness or slurred speech  1. What are your last 5 BP readings?  10/17: 82/44 (7:30 PM) later on 113/67 (8:30 PM) 10/18: 102/62 (7:30 PM) later on 109/64 (8:30 PM) 10/19: 137/81 this morning 7:30 AM  2. Are you having any other symptoms (ex. Dizziness, headache, blurred vision, passed out)?  Generalized weakness  3. What is your BP issue?  States pt has become very weak a few hours after taking her daily Xarelto.

## 2017-10-08 NOTE — Telephone Encounter (Signed)
Returned call to [place] (daughter). She reports patient c/o weakness, fatigue after taking xarelto. This has been occurring for a few nights. BP readings copied below. She did have a tooth pulled recently and had some bleeding after this and held xarelto an extra day, since this no recurrent bleeding (see phone note 10/8). Asked if patient is eating well and staying hydrated - she didn't think patient was doing this, so she has since advised for patient to drink more fluids since these episodes have occured. She was advised to check patient's BP no more than twice daily, 1-2 hours after taking any BP/cardiac medications, after resting for 5-10 mins in a sitting position and to call office if SBP is consistently under 100. Advised to continue all current medications and monitor symptoms.   Routed to MD for any further recommendations.

## 2017-10-12 NOTE — Telephone Encounter (Signed)
Returned call to [place]. Notified her of MD recommendations. She states patient has PCP appt on Thursday 10/25. She will have labs there. She wrote down the requested labs per Dr. Salena Saner and our fax # to provide to PCP office staff. Informed her to have PCP office call if they need a lab order sent to them

## 2017-10-13 ENCOUNTER — Telehealth: Payer: Self-pay | Admitting: Cardiovascular Disease

## 2017-10-13 DIAGNOSIS — R531 Weakness: Secondary | ICD-10-CM

## 2017-10-13 DIAGNOSIS — R5383 Other fatigue: Secondary | ICD-10-CM

## 2017-10-13 DIAGNOSIS — Z79899 Other long term (current) drug therapy: Secondary | ICD-10-CM

## 2017-10-13 NOTE — Telephone Encounter (Signed)
Returned call to daughter [place]. Patient's PCP appt got cancelled and moved to Nov so she wishes to bring patient here for lab work. BMET & CBC ordered. Daughter aware that flu vaccines are only given during an office visit but advised patient can go to local pharmacy for vaccination.

## 2017-10-13 NOTE — Telephone Encounter (Signed)
New message    Patient daughter calling to request lab order. No longer wants labs to be done at PCP office.  Requesting to get labs done on 10/26 at [facility], also wanting to know about getting a flu shot Friday.  Please call daughter on mobile.

## 2017-10-14 LAB — CBC
Hematocrit: 39.6 % (ref 34.0–46.6)
Hemoglobin: 12.9 g/dL (ref 11.1–15.9)
MCH: 30.6 pg (ref 26.6–33.0)
MCHC: 32.6 g/dL (ref 31.5–35.7)
MCV: 94 fL (ref 79–97)
Platelets: 222 10*3/uL (ref 150–379)
RBC: 4.21 x10E6/uL (ref 3.77–5.28)
RDW: 13.6 % (ref 12.3–15.4)
WBC: 5.3 10*3/uL (ref 3.4–10.8)

## 2017-10-14 LAB — BASIC METABOLIC PANEL
BUN/Creatinine Ratio: 13 (ref 12–28)
BUN: 14 mg/dL (ref 8–27)
CO2: 26 mmol/L (ref 20–29)
Calcium: 9.6 mg/dL (ref 8.7–10.3)
Chloride: 102 mmol/L (ref 96–106)
Creatinine, Ser: 1.04 mg/dL — ABNORMAL HIGH (ref 0.57–1.00)
GFR calc Af Amer: 58 mL/min/{1.73_m2} — ABNORMAL LOW (ref >59)
GFR calc non Af Amer: 50 mL/min/{1.73_m2} — ABNORMAL LOW (ref >59)
Glucose: 115 mg/dL — ABNORMAL HIGH (ref 65–99)
Potassium: 4.6 mmol/L (ref 3.5–5.2)
Sodium: 141 mmol/L (ref 134–144)

## 2017-10-17 ENCOUNTER — Other Ambulatory Visit: Payer: Self-pay | Admitting: Cardiovascular Disease

## 2017-10-18 NOTE — Telephone Encounter (Signed)
REFILL 

## 2017-10-26 ENCOUNTER — Ambulatory Visit (INDEPENDENT_AMBULATORY_CARE_PROVIDER_SITE_OTHER): Payer: Medicare Other | Admitting: *Deleted

## 2017-10-26 DIAGNOSIS — I442 Atrioventricular block, complete: Secondary | ICD-10-CM

## 2017-10-26 NOTE — Progress Notes (Signed)
Remote pacemaker transmission.   

## 2017-10-28 ENCOUNTER — Encounter: Payer: Self-pay | Admitting: Cardiology

## 2017-10-30 LAB — CUP PACEART REMOTE DEVICE CHECK
Battery Impedance: 160 Ohm
Battery Remaining Longevity: 113 mo
Battery Voltage: 2.78 V
Brady Statistic AP VP Percent: 95 %
Brady Statistic AP VS Percent: 1 %
Brady Statistic AS VP Percent: 4 %
Brady Statistic AS VS Percent: 0 %
Date Time Interrogation Session: 20181106121724
Implantable Lead Implant Date: 20070920
Implantable Lead Implant Date: 20070920
Implantable Lead Location: 753859
Implantable Lead Location: 753860
Implantable Lead Model: 4592
Implantable Lead Model: 5092
Implantable Pulse Generator Implant Date: 20160707
Lead Channel Impedance Value: 510 Ohm
Lead Channel Impedance Value: 837 Ohm
Lead Channel Pacing Threshold Amplitude: 0.75 V
Lead Channel Pacing Threshold Amplitude: 1.25 V
Lead Channel Pacing Threshold Pulse Width: 0.4 ms
Lead Channel Pacing Threshold Pulse Width: 0.4 ms
Lead Channel Setting Pacing Amplitude: 2 V
Lead Channel Setting Pacing Amplitude: 2.5 V
Lead Channel Setting Pacing Pulse Width: 0.4 ms
Lead Channel Setting Sensing Sensitivity: 5.6 mV

## 2017-11-03 ENCOUNTER — Encounter: Payer: Self-pay | Admitting: Cardiovascular Disease

## 2017-11-03 ENCOUNTER — Ambulatory Visit (INDEPENDENT_AMBULATORY_CARE_PROVIDER_SITE_OTHER): Payer: Medicare Other | Admitting: Cardiovascular Disease

## 2017-11-03 VITALS — BP 146/82 | HR 81 | Ht 62.0 in | Wt 163.2 lb

## 2017-11-03 DIAGNOSIS — Q211 Atrial septal defect, unspecified: Secondary | ICD-10-CM

## 2017-11-03 DIAGNOSIS — Z950 Presence of cardiac pacemaker: Secondary | ICD-10-CM | POA: Diagnosis not present

## 2017-11-03 DIAGNOSIS — E785 Hyperlipidemia, unspecified: Secondary | ICD-10-CM

## 2017-11-03 DIAGNOSIS — Q221 Congenital pulmonary valve stenosis: Secondary | ICD-10-CM | POA: Diagnosis not present

## 2017-11-03 DIAGNOSIS — I5032 Chronic diastolic (congestive) heart failure: Secondary | ICD-10-CM | POA: Diagnosis not present

## 2017-11-03 DIAGNOSIS — I442 Atrioventricular block, complete: Secondary | ICD-10-CM

## 2017-11-03 DIAGNOSIS — I480 Paroxysmal atrial fibrillation: Secondary | ICD-10-CM | POA: Diagnosis not present

## 2017-11-03 DIAGNOSIS — Z7901 Long term (current) use of anticoagulants: Secondary | ICD-10-CM | POA: Diagnosis not present

## 2017-11-03 LAB — LIPID PANEL
Chol/HDL Ratio: 2.3 ratio (ref 0.0–4.4)
Cholesterol, Total: 130 mg/dL (ref 100–199)
HDL: 57 mg/dL (ref >39)
LDL Calculated: 49 mg/dL (ref 0–99)
Triglycerides: 120 mg/dL (ref 0–149)
VLDL Cholesterol Cal: 24 mg/dL (ref 5–40)

## 2017-11-03 NOTE — Patient Instructions (Signed)
Dr Royann Shiversroitoru recommends that you continue on your current medications as directed. Please refer to the Current Medication list given to you today.  Your physician recommends that you return for lab work at your earliest convenience - FASTING.  Remote monitoring is used to monitor your Pacemaker or ICD from home. This monitoring reduces the number of office visits required to check your device to one time per year. It allows us to keep an eye on the functioning of your device to ensure it is working properly. You are scheduled for a device check from home on Tuesday, February 5th, 2019. You may send your transmission at any time that day. If you have a wireless device, the transmission will be sent automatically. After your physician reviews your transmission, you will receive a notification with your next transmission date.  Dr Royann Shiversroitoru recommends that you schedule a follow-up appointment in 12 months with a pacemaker check. You will receive a reminder letter in the mail two months in advance. If you don't receive a letter, please call our office to schedule the follow-up appointment.  If you need a refill on your cardiac medications before your next appointment, please call your pharmacy.

## 2017-11-04 ENCOUNTER — Other Ambulatory Visit: Payer: Self-pay | Admitting: Cardiovascular Disease

## 2017-11-05 NOTE — Progress Notes (Signed)
ROS  Patient ID: [name] Baird, female   DOB: [birth year], 83 y.o.   MRN: [id number]    Cardiology Office Note    Date:  11/05/2017   ID:  [name] Baird, DOB 1933-07-21, MRN [id number]  PCP:  [name] Bamberger, NP (Inactive)  Cardiologist:   [name] Fair, MD   Chief Complaint  Patient presents with  . Follow-up    History of Present Illness:  [name] is a 83 y.o. female with a history of congenital heart disease (atrial septal defect status post patch closure, pulmonic stenosis status post valvotomy), complete heart block status post dual-chamber permanent pacemaker implantation and recurrent problems with paroxysmal atrial flutter and atrial fibrillation on Xarelto. She has history of diastolic heart failure without recent exacerbation.  She has not had any cardiac problems recently.  She denies angina or dyspnea, but is of course quite sedentary.  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  She has not required extra doses of diuretic in months.  She is not troubled by leg edema or orthopnea.  She is not aware of palpitations even though her pacemaker has shown occasional arrhythmia.  Comprehensive interrogation shows normal device function today.  Her Medtronic Adapta dual-chamber device was implanted in 2016 and still has over 9 years of longevity, even though she has a very high frequency of pacing.  She has 95.6% atrial pacing at 98.9% ventricular pacing.  She does not have any AV conduction and is pacemaker dependent.  She is very poorly tolerant of ventricular threshold testing.  In the last 2 years she is only had a couple of episodes of atrial fibrillation.  In April 2017 she had one episode that lasted for 6 hours.  In March 2018 the atrial fibrillation only lasted for just over 2 minutes.  She had a flutter ablation performed by Dr. Johney Frame in July 2014 and is not taking any antiarrhythmics at  this time, other than a low dose of beta blocker. At the time of the EP study she was noted to have conventional counterclockwise isthmus-dependent right atrial flutter but then a second type of right atrial flutter was noted briefly. It could not be reinduced for full study.    Past Medical History:  Diagnosis Date  . Anxiety   . Atrial flutter (HCC)    Baird/p ablation 06/29/2013 by Dr Johney Frame  . CHF (congestive heart failure) (HCC)   . Complete heart block (HCC) 2007   Baird/p medtronic pacemaker implantation  . Depression   . Glaucoma   . Hypertension   . Pulmonary stenosis sp valvotomy       . Shortness of breath   . Status post patch closure of ASD     Past Surgical History:  Procedure Laterality Date  . ABLATION  06/29/2013   Baird/p isthmus ablation for atrial flutter by Dr Johney Frame  . AORTIC VALVE REPAIR    . ATRIAL FLUTTER ABLATION N/A 06/29/2013   Performed by [name] Range, MD at [facility] CATH LAB  . CARDIOVERSION N/A 03/24/2013   Performed by [name] Fair, MD at [facility] ENDOSCOPY  . PACEMAKER INSERTION  2007   Medtronic Adapta ADDRL1, serial # [id number]  . PPM Generator Changeout N/A 06/27/2015   Performed by [name] Fair, MD at [facility] INVASIVE CV LAB  . pulmonary valvotomy  1985  . Temporary Pacemaker N/A 06/27/2015   Performed by [name] Fair, MD at [facility] INVASIVE  CV LAB  . trigeminal sx     left side    Outpatient Medications Prior to Visit  Medication Sig Dispense Refill  . acetaminophen (TYLENOL ARTHRITIS PAIN) 650 MG CR tablet Take 1,300 mg by mouth daily as needed for pain.     . bacitracin ophthalmic ointment Place 1 application into the left eye 4 (four) times daily.  1  . brimonidine (ALPHAGAN) 0.2 % ophthalmic solution Place 1 drop into the left eye 2 (two) times daily.  5  . BYSTOLIC 10 MG tablet TAKE 1 TABLET BY MOUTH EVERY DAY 30 tablet 6  . calcium-vitamin D (OSCAL) 250-125 MG-UNIT per tablet Take 1 tablet by mouth daily.    . clonazePAM (KLONOPIN) 0.5 MG tablet Take 0.5 mg  by mouth daily as needed.     . dorzolamide-timolol (COSOPT) 22.3-6.8 MG/ML ophthalmic solution Place 2 drops into both eyes 2 (two) times daily.    . furosemide (LASIX) 40 MG tablet TAKE 1 TABLET DAILY. MAY TAKE AN EXTRA DOSE DAILY AS NEEDED FOR WEIGHT GAIN. 180 tablet 1  . latanoprost (XALATAN) 0.005 % ophthalmic solution Place 1 drop into both eyes at bedtime.    [name] Kitchen. losartan (COZAAR) 50 MG tablet Take 1 tablet (50 mg total) by mouth daily. KEEP OV. 90 tablet 1  . Multiple Vitamins-Minerals (MULTIVITAMIN PO) Take 1 tablet by mouth daily.     . pantoprazole (PROTONIX) 40 MG tablet Take 1 tablet (40 mg total) by mouth daily. 30 tablet 7  . potassium chloride (K-DUR,KLOR-CON) 10 MEQ tablet Take 2 tablets (20 mEq total) by mouth daily. KEEP OV. 180 tablet 1  . rivaroxaban (XARELTO) 20 MG TABS tablet Take 1 tablet (20 mg total) by mouth daily with supper. 90 tablet 1  . rosuvastatin (CRESTOR) 10 MG tablet Take 10 mg by mouth at bedtime.    [name] Kitchen. venlafaxine XR (EFFEXOR-XR) 75 MG 24 hr capsule Take 75 mg by mouth daily.    [name] Kitchen. ofloxacin (OCUFLOX) 0.3 % ophthalmic solution Place 1 drop into the left eye 2 (two) times daily.   0  . VIGAMOX 0.5 % ophthalmic solution Place 1 application into the left eye 4 (four) times daily.     Facility-Administered Medications Prior to Visit  Medication Dose Route Frequency Provider Last Rate Last Dose  . triamcinolone acetonide (KENALOG) 10 MG/ML injection 10 mg  10 mg Other Once Lenn Sinkegal, [name] Baird, DPM         Allergies:   Sulfa antibiotics and Hydrocodone   Social History   Socioeconomic History  . Marital status: Widowed    Spouse name: None  . Number of children: 2  . Years of education: None  . Highest education level: None  Social Needs  . Financial resource strain: None  . Food insecurity - worry: None  . Food insecurity - inability: None  . Transportation needs - medical: None  . Transportation needs - non-medical: None  Occupational History  . None    Tobacco Use  . Smoking status: Never Smoker  . Smokeless tobacco: Never Used  Substance and Sexual Activity  . Alcohol use: No  . Drug use: No  . Sexual activity: No  Other Topics Concern  . None  Social History Narrative  . None    ROS:   Please see the history of present illness.    ROS All other systems reviewed and are negative.   PHYSICAL EXAM:   VS:  BP (!) 146/82   Pulse 81  Ht 5\' 2"  (1.575 m)   Wt 163 lb 3.2 oz (74 kg)   BMI 29.85 kg/m    Recheck blood pressure was 122/77  General: Alert, oriented x3, no distress, borderline obese Head: no evidence of trauma, PERRL, EOMI, no exophtalmos or lid lag, left eyelids sutured together at their external corner no myxedema, no xanthelasma; normal ears, nose and oropharynx Neck: normal jugular venous pulsations and no hepatojugular reflux; brisk carotid pulses without delay and no carotid bruits Chest: clear to auscultation, no signs of consolidation by percussion or palpation, normal fremitus, symmetrical and full respiratory excursions Cardiovascular: normal position and quality of the apical impulse, regular rhythm, normal first and paradoxically split second heart sounds, grade 2/6 early peaking systolic ejection murmur heard at both the left and right upper sternal borders, no diastolic murmurs, rubs or gallops.  Healthy left subclavian pacemaker site Abdomen: no tenderness or distention, no masses by palpation, no abnormal pulsatility or arterial bruits, normal bowel sounds, no hepatosplenomegaly Extremities: no clubbing, cyanosis or edema; 2+ radial, ulnar and brachial pulses bilaterally; 2+ right femoral, posterior tibial and dorsalis pedis pulses; 2+ left femoral, posterior tibial and dorsalis pedis pulses; no subclavian or femoral bruits Neurological: grossly nonfocal Psych: Normal mood and affect   Wt Readings from Last 3 Encounters:  11/03/17 163 lb 3.2 oz (74 kg)  03/03/17 158 lb (71.7 kg)  10/27/16 160 lb 12.8  oz (72.9 kg)      Studies/Labs Reviewed:   EKG:  EKG is ordered today.  ECG shows 100% AV sequential pacing  ASSESSMENT:    1. Chronic diastolic heart failure (HCC)   2. Complete heart block (HCC)   3. Pacemaker   4. Paroxysmal atrial fibrillation (HCC)   5. Dyslipidemia   6. Pulmonic stenosis, congenital   7. Atrial septal defect   8. Long term current use of anticoagulant      PLAN:  In order of problems listed above:  1. CHF, diastolic: She is maintaining euvolemic and asymptomatic status on a low dose of loop diuretic. 2013, echo reported EF 40% septal dyssynchrony, but nuclear scintigraphy showed normal perfusion and normal LVEF 57%. Echo also showed mild to moderate mitral insufficiency and mild to moderate aortic insufficiency 2. CHB: Pacemaker dependent without any escape rhythm and very poorly tolerant of threshold testing 3. PPM: Recheck remote download in 3 months and office visit yearly 4. PAFlutter/ Afib: The burden of atrial fibrillation is extremely low.  The arrhythmia is asymptomatic.  She is on appropriate anticoagulation. CHADSVasc4 (age 74, gender, HF). 5. HLP: Recheck lipids 6. PS Baird/p surgical valvotomy: No residual gradient, mild to moderate regurgitation by echo 7. ASD Baird/p patch closure: No residual shunt. 8. Xarelto: No bleeding complications.      Medication Adjustments/Labs and Tests Ordered: Current medicines are reviewed at length with the patient today.  Concerns regarding medicines are outlined above.  Medication changes, Labs and Tests ordered today are listed in the Patient Instructions below. Patient Instructions  Dr Royann Shiversroitoru recommends that you continue on your current medications as directed. Please refer to the Current Medication list given to you today.  Your physician recommends that you return for lab work at your earliest convenience - FASTING.  Remote monitoring is used to monitor your Pacemaker or ICD from home. This monitoring  reduces the number of office visits required to check your device to one time per year. It allows us to keep an eye on the functioning of your device to ensure it  is working properly. You are scheduled for a device check from home on Tuesday, February 5th, 2019. You may send your transmission at any time that day. If you have a wireless device, the transmission will be sent automatically. After your physician reviews your transmission, you will receive a notification with your next transmission date.  Dr Royann Shivers recommends that you schedule a follow-up appointment in 12 months with a pacemaker check. You will receive a reminder letter in the mail two months in advance. If you don't receive a letter, please call our office to schedule the follow-up appointment.  If you need a refill on your cardiac medications before your next appointment, please call your pharmacy.   Signed, [name] Fair, MD  11/05/2017 5:42 PM    [facility] [address] [place], [place], [place]  [address] Phone: [phone]; Fax: [phone]

## 2017-11-27 ENCOUNTER — Other Ambulatory Visit: Payer: Self-pay | Admitting: Cardiovascular Disease

## 2017-12-15 LAB — CUP PACEART INCLINIC DEVICE CHECK
Battery Impedance: 160 Ohm
Battery Remaining Longevity: 112 mo
Battery Voltage: 2.78 V
Brady Statistic AP VP Percent: 95 %
Brady Statistic AP VS Percent: 1 %
Brady Statistic AS VP Percent: 4 %
Brady Statistic AS VS Percent: 0 %
Date Time Interrogation Session: 20181114134654
Implantable Lead Implant Date: 20070920
Implantable Lead Implant Date: 20070920
Implantable Lead Location: 753859
Implantable Lead Location: 753860
Implantable Lead Model: 4592
Implantable Lead Model: 5092
Implantable Pulse Generator Implant Date: 20160707
Lead Channel Impedance Value: 503 Ohm
Lead Channel Impedance Value: 789 Ohm
Lead Channel Pacing Threshold Amplitude: 0.75 V
Lead Channel Pacing Threshold Amplitude: 0.75 V
Lead Channel Pacing Threshold Amplitude: 0.75 V
Lead Channel Pacing Threshold Amplitude: 1.25 V
Lead Channel Pacing Threshold Pulse Width: 0.4 ms
Lead Channel Pacing Threshold Pulse Width: 0.4 ms
Lead Channel Pacing Threshold Pulse Width: 0.4 ms
Lead Channel Pacing Threshold Pulse Width: 0.4 ms
Lead Channel Sensing Intrinsic Amplitude: 2 mV
Lead Channel Setting Pacing Amplitude: 2 V
Lead Channel Setting Pacing Amplitude: 2.5 V
Lead Channel Setting Pacing Pulse Width: 0.4 ms
Lead Channel Setting Sensing Sensitivity: 5.6 mV

## 2018-01-25 ENCOUNTER — Ambulatory Visit (INDEPENDENT_AMBULATORY_CARE_PROVIDER_SITE_OTHER): Payer: Medicare Other | Admitting: *Deleted

## 2018-01-25 DIAGNOSIS — I442 Atrioventricular block, complete: Secondary | ICD-10-CM | POA: Diagnosis not present

## 2018-01-25 NOTE — Progress Notes (Signed)
Remote pacemaker transmission.   

## 2018-01-26 ENCOUNTER — Other Ambulatory Visit: Payer: Self-pay | Admitting: Cardiovascular Disease

## 2018-01-26 ENCOUNTER — Encounter: Payer: Self-pay | Admitting: Cardiology

## 2018-02-07 ENCOUNTER — Other Ambulatory Visit: Payer: Self-pay | Admitting: Adult Health

## 2018-02-07 DIAGNOSIS — N6002 Solitary cyst of left breast: Secondary | ICD-10-CM

## 2018-02-16 LAB — CUP PACEART REMOTE DEVICE CHECK
Battery Impedance: 184 Ohm
Battery Remaining Longevity: 110 mo
Battery Voltage: 2.78 V
Brady Statistic AP VP Percent: 83 %
Brady Statistic AP VS Percent: 0 %
Brady Statistic AS VP Percent: 16 %
Brady Statistic AS VS Percent: 0 %
Date Time Interrogation Session: 20190205122222
Implantable Lead Implant Date: 20070920
Implantable Lead Implant Date: 20070920
Implantable Lead Location: 753859
Implantable Lead Location: 753860
Implantable Lead Model: 4592
Implantable Lead Model: 5092
Implantable Pulse Generator Implant Date: 20160707
Lead Channel Impedance Value: 495 Ohm
Lead Channel Impedance Value: 837 Ohm
Lead Channel Pacing Threshold Amplitude: 0.75 V
Lead Channel Pacing Threshold Amplitude: 1.25 V
Lead Channel Pacing Threshold Pulse Width: 0.4 ms
Lead Channel Pacing Threshold Pulse Width: 0.4 ms
Lead Channel Setting Pacing Amplitude: 2 V
Lead Channel Setting Pacing Amplitude: 2.5 V
Lead Channel Setting Pacing Pulse Width: 0.4 ms
Lead Channel Setting Sensing Sensitivity: 5.6 mV

## 2018-02-28 ENCOUNTER — Ambulatory Visit
Admission: RE | Admit: 2018-02-28 | Discharge: 2018-02-28 | Disposition: A | Discharge status: Home / Self Care | Payer: Medicare Other | Source: Ambulatory Visit | Attending: Adult Health | Admitting: Adult Health

## 2018-02-28 ENCOUNTER — Ambulatory Visit: Payer: Medicare Other

## 2018-02-28 DIAGNOSIS — N6002 Solitary cyst of left breast: Secondary | ICD-10-CM

## 2018-03-01 ENCOUNTER — Other Ambulatory Visit: Payer: Self-pay | Admitting: Cardiovascular Disease

## 2018-03-01 NOTE — Telephone Encounter (Signed)
LMOM, patient needs to decrease dose of Xarelto.  SCr  48.7

## 2018-03-02 MED ORDER — RIVAROXABAN 15 MG PO TABS: 15.0000 mg | ORAL_TABLET | Freq: Every day | ORAL | 1 refills | Status: DC

## 2018-03-02 NOTE — Telephone Encounter (Signed)
Spoke with daughter [name] and explained reason for dose decrease.  Daughter voiced understanding and will let patient know that dose has changed.

## 2018-03-04 ENCOUNTER — Telehealth: Payer: Self-pay | Admitting: Cardiovascular Disease

## 2018-03-04 MED ORDER — CANDESARTAN CILEXETIL 8 MG PO TABS: 8.0000 mg | ORAL_TABLET | Freq: Every day | ORAL | 5 refills | Status: DC

## 2018-03-04 NOTE — Telephone Encounter (Signed)
Pt c/o medication issue:  1. Name of Medication: Losartan   2. How are you currently taking this medication (dosage and times per day)? 50 mg // 1x day  3. Are you having a reaction (difficulty breathing--STAT)?no  4. What is your medication issue? [name] (patient daughter) states that she received letter in mail from home delivery pharmacy and states that Losartan has byproduct and to contact office

## 2018-03-04 NOTE — Telephone Encounter (Signed)
Returned call-states they received letter saying her Losartan is part of the recall and needs to be replaced.     Patient will stop losartan and start candesartan 8mg  daily per pharmD and monitor BP for 2-3 weeks.  rx sent to pharmacy.

## 2018-03-04 NOTE — Telephone Encounter (Signed)
Switch to candesartan 8 mg once daily.  If possible have patient monitor home BP for 2-3 weeks after making the switch.  Hopefully her insurance will cover the medication

## 2018-03-18 ENCOUNTER — Ambulatory Visit
Admission: RE | Admit: 2018-03-18 | Discharge: 2018-03-18 | Disposition: A | Discharge status: Home / Self Care | Payer: Medicare Other | Source: Ambulatory Visit | Attending: Adult Health | Admitting: Adult Health

## 2018-03-18 DIAGNOSIS — N6002 Solitary cyst of left breast: Secondary | ICD-10-CM

## 2018-04-14 ENCOUNTER — Encounter (HOSPITAL_COMMUNITY): Payer: Self-pay

## 2018-04-14 ENCOUNTER — Emergency Department (HOSPITAL_COMMUNITY): Payer: Medicare Other

## 2018-04-14 ENCOUNTER — Emergency Department (HOSPITAL_COMMUNITY)
Admission: EM | Admit: 2018-04-14 | Discharge: 2018-04-15 | Disposition: A | Discharge status: Home / Self Care | Payer: Medicare Other | Attending: Emergency Medicine | Admitting: Emergency Medicine

## 2018-04-14 DIAGNOSIS — I4891 Unspecified atrial fibrillation: Secondary | ICD-10-CM | POA: Insufficient documentation

## 2018-04-14 DIAGNOSIS — Z950 Presence of cardiac pacemaker: Secondary | ICD-10-CM | POA: Insufficient documentation

## 2018-04-14 DIAGNOSIS — I110 Hypertensive heart disease with heart failure: Secondary | ICD-10-CM | POA: Insufficient documentation

## 2018-04-14 DIAGNOSIS — I281 Aneurysm of pulmonary artery: Secondary | ICD-10-CM | POA: Diagnosis not present

## 2018-04-14 DIAGNOSIS — I5032 Chronic diastolic (congestive) heart failure: Secondary | ICD-10-CM | POA: Insufficient documentation

## 2018-04-14 DIAGNOSIS — Z79899 Other long term (current) drug therapy: Secondary | ICD-10-CM | POA: Insufficient documentation

## 2018-04-14 DIAGNOSIS — R918 Other nonspecific abnormal finding of lung field: Secondary | ICD-10-CM | POA: Diagnosis present

## 2018-04-14 DIAGNOSIS — Z7901 Long term (current) use of anticoagulants: Secondary | ICD-10-CM | POA: Diagnosis not present

## 2018-04-14 LAB — COMPREHENSIVE METABOLIC PANEL
ALT: 17 U/L (ref 14–54)
AST: 27 U/L (ref 15–41)
Albumin: 4.1 g/dL (ref 3.5–5.0)
Alkaline Phosphatase: 86 U/L (ref 38–126)
Anion gap: 10 (ref 5–15)
BUN: 13 mg/dL (ref 6–20)
CO2: 26 mmol/L (ref 22–32)
Calcium: 9.3 mg/dL (ref 8.9–10.3)
Chloride: 102 mmol/L (ref 101–111)
Creatinine, Ser: 1.1 mg/dL — ABNORMAL HIGH (ref 0.44–1.00)
GFR calc Af Amer: 53 mL/min — ABNORMAL LOW (ref >60)
GFR calc non Af Amer: 45 mL/min — ABNORMAL LOW (ref >60)
Glucose, Bld: 112 mg/dL — ABNORMAL HIGH (ref 65–99)
Potassium: 4.5 mmol/L (ref 3.5–5.1)
Sodium: 138 mmol/L (ref 135–145)
Total Bilirubin: 1 mg/dL (ref 0.3–1.2)
Total Protein: 7.2 g/dL (ref 6.5–8.1)

## 2018-04-14 LAB — CBC WITH DIFFERENTIAL/PLATELET
Basophils Absolute: 0 10*3/uL (ref 0.0–0.1)
Basophils Relative: 0 %
Eosinophils Absolute: 0 10*3/uL (ref 0.0–0.7)
Eosinophils Relative: 1 %
HCT: 44.6 % (ref 36.0–46.0)
Hemoglobin: 14.3 g/dL (ref 12.0–15.0)
Lymphocytes Relative: 23 %
Lymphs Abs: 1 10*3/uL (ref 0.7–4.0)
MCH: 30.8 pg (ref 26.0–34.0)
MCHC: 32.1 g/dL (ref 30.0–36.0)
MCV: 95.9 fL (ref 78.0–100.0)
Monocytes Absolute: 0.5 10*3/uL (ref 0.1–1.0)
Monocytes Relative: 12 %
Neutro Abs: 2.7 10*3/uL (ref 1.7–7.7)
Neutrophils Relative %: 64 %
Platelets: 152 10*3/uL (ref 150–400)
RBC: 4.65 MIL/uL (ref 3.87–5.11)
RDW: 14.2 % (ref 11.5–15.5)
WBC: 4.2 10*3/uL (ref 4.0–10.5)

## 2018-04-14 MED ORDER — SODIUM CHLORIDE 0.9 % IV BOLUS
250.0000 mL | Freq: Once | INTRAVENOUS | Status: AC
  Administered 2018-04-14: 250 mL via INTRAVENOUS

## 2018-04-14 MED ORDER — IOPAMIDOL (ISOVUE-370) INJECTION 76%
INTRAVENOUS | Status: AC
  Filled 2018-04-14: qty 100

## 2018-04-14 MED ORDER — IOPAMIDOL (ISOVUE-370) INJECTION 76%
100.0000 mL | Freq: Once | INTRAVENOUS | Status: AC | PRN
  Administered 2018-04-14: 100 mL via INTRAVENOUS

## 2018-04-14 NOTE — ED Provider Notes (Signed)
[facility] Provider Note   CSN: [id number] Arrival date & time: 04/14/18  1151     History   Chief Complaint Chief Complaint  Patient presents with  . sent by pcp    HPI [name] Baird is a 84 y.o. female.  Patient with history of pulmonary stenosis, atrial flutter, heart failure, complete heart block status post Medtronic pacemaker who presents the ED after getting chest x-ray that showed concern for aneurysm versus mass of aortic arch.  Patient with URI symptoms for the last several days and went to an urgent care for checkup.  Patient had a chest x-ray that showed possible mass versus aneurysm and was told to come in for evaluation with a CT scan.  Patient does not have any chest pain, shortness of breath.  The history is provided by the patient.  Illness  This is a new problem. The current episode started more than 2 days ago. The problem occurs daily. The problem has been gradually improving. Pertinent negatives include no chest pain, no abdominal pain and no shortness of breath. Nothing aggravates the symptoms. Nothing relieves the symptoms. She has tried nothing for the symptoms. The treatment provided no relief.    Past Medical History:  Diagnosis Date  . Anxiety   . Atrial flutter (HCC)    s/p ablation 06/29/2013 by Dr Johney FrameAllred  . CHF (congestive heart failure) (HCC)   . Complete heart block (HCC) 2007   s/p medtronic pacemaker implantation  . Depression   . Glaucoma   . Hypertension   . Pulmonary stenosis sp valvotomy       . Shortness of breath   . Status post patch closure of ASD     Patient Active Problem List   Diagnosis Date Noted  . Long term current use of anticoagulant therapy 06/26/2016  . Indigestion 03/25/2016  . Chronic diastolic heart failure (HCC) 01/16/2016  . Pacemaker battery depletion 06/27/2015  . Atrial fibrillation (HCC) 08/03/2013  . Atrial flutter (HCC) 06/08/2013  . Complete heart block (HCC)  06/08/2013  . Pacemaker   . Status post patch closure of ASD   . Pulmonary stenosis sp valvotomy     Past Surgical History:  Procedure Laterality Date  . ABLATION  06/29/2013   s/p isthmus ablation for atrial flutter by Dr Johney FrameAllred  . AORTIC VALVE REPAIR    . ATRIAL FLUTTER ABLATION N/A 06/29/2013   Procedure: ATRIAL FLUTTER ABLATION;  Surgeon: Hillis RangeJames Allred, MD;  [facility];  Service: Cardiovascular;  Laterality: N/A;  . CARDIAC CATHETERIZATION N/A 06/27/2015   Procedure: Temporary Pacemaker;  Surgeon: Thurmon FairMihai Croitoru, MD;  Location: [facility] INVASIVE CV LAB;  Service: Cardiovascular;  Laterality: N/A;  . CARDIOVERSION N/A 03/24/2013   Procedure: CARDIOVERSION;  Surgeon: Thurmon FairMihai Croitoru, MD;  Location: [facility] ENDOSCOPY;  Service: Cardiovascular;  Laterality: N/A;  . EP IMPLANTABLE DEVICE N/A 06/27/2015   Procedure: PPM Generator Changeout;  Surgeon: Thurmon FairMihai Croitoru, MD;  Location: [facility] INVASIVE CV LAB;  Service: Cardiovascular;  Laterality: N/A;  . PACEMAKER INSERTION  2007   Medtronic Adapta ADDRL1, serial # [id number]  . pulmonary valvotomy  1985  . trigeminal sx     left side     OB History   None      Home Medications    Prior to Admission medications   Medication Sig Start Date End Date Taking? Authorizing Provider  acetaminophen (TYLENOL ARTHRITIS PAIN) 650 MG CR tablet Take 1,300 mg by mouth daily as needed  for pain.     [provider]  bacitracin ophthalmic ointment Place 1 application into the left eye 4 (four) times daily. 05/25/16   [provider]  brimonidine (ALPHAGAN) 0.2 % ophthalmic solution Place 1 drop into the left eye 2 (two) times daily. 06/04/15   [provider]  BYSTOLIC 10 MG tablet TAKE 1 TABLET BY MOUTH EVERY DAY 01/26/18   Croitoru, Mihai, MD  calcium-vitamin D (OSCAL) 250-125 MG-UNIT per tablet Take 1 tablet by mouth daily.    [provider]  candesartan (ATACAND) 8 MG tablet Take 1 tablet (8 mg total) by mouth daily. 03/04/18    Croitoru, Mihai, MD  clonazePAM (KLONOPIN) 0.5 MG tablet Take 0.5 mg by mouth daily as needed.     [provider]  dorzolamide-timolol (COSOPT) 22.3-6.8 MG/ML ophthalmic solution Place 2 drops into both eyes 2 (two) times daily.    [provider]  furosemide (LASIX) 40 MG tablet TAKE 1 TABLET DAILY. MAY TAKE AN EXTRA DOSE DAILY AS NEEDED FOR WEIGHT GAIN. 10/18/17   Croitoru, Mihai, MD  latanoprost (XALATAN) 0.005 % ophthalmic solution Place 1 drop into both eyes at bedtime.    [provider]  Multiple Vitamins-Minerals (MULTIVITAMIN PO) Take 1 tablet by mouth daily.     [provider]  ofloxacin (OCUFLOX) 0.3 % ophthalmic solution Place 1 drop into the left eye 2 (two) times daily.  06/13/15   [provider]  pantoprazole (PROTONIX) 40 MG tablet TAKE 1 TABLET BY MOUTH ONCE DAILY 11/30/17   Croitoru, Mihai, MD  potassium chloride (K-DUR,KLOR-CON) 10 MEQ tablet Take 2 tablets (20 mEq total) by mouth daily. KEEP OV. 10/18/17   Croitoru, Mihai, MD  Rivaroxaban (XARELTO) 15 MG TABS tablet Take 1 tablet (15 mg total) by mouth daily with supper. 03/02/18   Allred, Fayrene Fearing, MD  rosuvastatin (CRESTOR) 10 MG tablet Take 10 mg by mouth at bedtime.    [provider]  venlafaxine XR (EFFEXOR-XR) 75 MG 24 hr capsule Take 75 mg by mouth daily. 06/20/15   [provider]  VIGAMOX 0.5 % ophthalmic solution Place 1 application into the left eye 4 (four) times daily. 05/30/16   [provider]    Family History No family history on file.  Social History Social History   Tobacco Use  . Smoking status: Never Smoker  . Smokeless tobacco: Never Used  Substance Use Topics  . Alcohol use: No  . Drug use: No     Allergies   Sulfa antibiotics and Hydrocodone   Review of Systems Review of Systems  Constitutional: Negative for chills and fever.  HENT: Negative for ear pain and sore throat.   Eyes: Negative for pain and visual  disturbance.  Respiratory: Negative for cough and shortness of breath.   Cardiovascular: Negative for chest pain and palpitations.  Gastrointestinal: Negative for abdominal pain and vomiting.  Genitourinary: Negative for dysuria and hematuria.  Musculoskeletal: Negative for arthralgias and back pain.  Skin: Negative for color change and rash.  Neurological: Negative for seizures and syncope.  All other systems reviewed and are negative.    Physical Exam Updated Vital Signs  ED Triage Vitals [04/14/18 1158]  Enc Vitals Group     BP 127/79     Pulse Rate 95     Resp 16     Temp 98.2 F (36.8 C)     Temp Source Oral     SpO2 98 %     Weight  Height      Head Circumference      Peak Flow      Pain Score 0     Pain Loc      Pain Edu?      Excl. in GC?      Physical Exam  Constitutional: She is oriented to person, place, and time. She appears well-developed and well-nourished. No distress.  HENT:  Head: Normocephalic and atraumatic.  Eyes: Pupils are equal, round, and reactive to light. Conjunctivae and EOM are normal.  Neck: Normal range of motion. Neck supple.  Cardiovascular: Normal rate, regular rhythm, normal heart sounds and intact distal pulses.  No murmur heard. Pulmonary/Chest: Effort normal. No respiratory distress. She has wheezes.  Abdominal: Soft. Bowel sounds are normal. There is no tenderness.  Musculoskeletal: Normal range of motion. She exhibits edema (trace).  Neurological: She is alert and oriented to person, place, and time.  Skin: Skin is warm and dry. Capillary refill takes less than 2 seconds.  Psychiatric: She has a normal mood and affect.  Nursing note and vitals reviewed.    ED Treatments / Results  Labs (all labs ordered are listed, but only abnormal results are displayed) Labs Reviewed  COMPREHENSIVE METABOLIC PANEL - Abnormal; Notable for the following components:      Result Value   Glucose, Bld 112 (*)    Creatinine, Ser 1.10  (*)    GFR calc non Af Amer 45 (*)    GFR calc Af Amer 53 (*)    All other components within normal limits  CBC WITH DIFFERENTIAL/PLATELET    EKG None  Radiology Ct Angio Chest/abd/pel For Dissection W And/or Wo Contrast  Result Date: 04/14/2018 CLINICAL DATA:  Vascular abnormality seen on chest radiograph. EXAM: CT ANGIOGRAPHY CHEST, ABDOMEN AND PELVIS TECHNIQUE: Multidetector CT imaging through the chest, abdomen and pelvis was performed using the standard protocol during bolus administration of intravenous contrast. Multiplanar reconstructed images and MIPs were obtained and reviewed to evaluate the vascular anatomy. CONTRAST:  ISOVUE-370 IOPAMIDOL (ISOVUE-370) INJECTION 76% COMPARISON:  Report for chest CT 11/11/1998. FINDINGS: CTA CHEST FINDINGS Cardiovascular: The heart size is normal. There is nopericardial effusion. Three lead right chest wall pacemaker. The course and caliber of the thoracic aorta are normal. There is mild aortic atherosclerotic calcification. Precontrast images show no aortic intramural hematoma. There is no blood pool, dissection or penetrating ulcer demonstrated on arterial phase postcontrast imaging. There is a conventional 3 vessel aortic arch branching pattern. The proximal arch vessels are widely patent. 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 The main pulmonary artery is also dilated, measuring approximately 3.5 cm just above bifurcation. This corresponds to the radiographic abnormality. There is suspected narrowing of the right ventricle outflow tract, incompletely evaluated on this study. Mediastinum/Nodes: No mediastinal, hilar or axillary lymphadenopathy. The visualized thyroid and thoracic esophageal course are unremarkable. Lungs/Pleura: No pulmonary nodules or masses. No pleural effusion or pneumothorax. No focal airspace consolidation. No focal pleural abnormality. Musculoskeletal: No chest wall abnormality. No  acute osseous findings. Remote median sternotomy. Review of the MIP images confirms the above findings. CTA ABDOMEN AND PELVIS FINDINGS VASCULAR Aorta: Normal caliber aorta without aneurysm, dissection, vasculitis or hemodynamically significant stenosis. There is no aortic atherosclerosis. Celiac: No aneurysm, dissection or hemodynamically significant stenosis. Normal branching pattern. SMA: Widely patent without dissection or stenosis. Renals: Single renal arteries bilaterally. No aneurysm, dissection, stenosis or evidence of fibromuscular dysplasia. IMA: Patent without abnormality.  Inflow: Minimal atherosclerotic calcification without stenosis or other abnormality. Veins: Normal course and caliber of the major veins. Assessment is otherwise limited by the arterial dominant contrast phase. Review of the MIP images confirms the above findings. NON-VASCULAR Hepatobiliary: Normal hepatic contours and density. No visible biliary dilatation. Normal gallbladder. Pancreas: Normal contours without ductal dilatation. No peripancreatic fluid collection. Spleen: Normal arterial phase splenic enhancement pattern. Focal calcification. Adrenals/Urinary Tract: --Adrenal glands: Normal. --Right kidney/ureter: No hydronephrosis or perinephric stranding. No nephrolithiasis. No obstructing ureteral stones. --Left kidney/ureter: No hydronephrosis or perinephric stranding. No nephrolithiasis. No obstructing ureteral stones. --Urinary bladder: Unremarkable. Stomach/Bowel: --Stomach/Duodenum: No hiatal hernia or other gastric abnormality. Normal duodenal course and caliber. --Small bowel: No dilatation or inflammation. --Colon: Diffuse colonic diverticulosis without acute inflammation. --Appendix: Normal. Lymphatic:  No abdominal or pelvic lymphadenopathy. Reproductive: Normal uterus and ovaries. Musculoskeletal. Grade 1 L4-L5 anterolisthesis. Chronic L1 compression deformity. Other: None. Review of the MIP images confirms the above  findings. IMPRESSION: 1. 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 Markedly dilated main pulmonary artery, which measures up to 3.5 cm. The large left main pulmonary artery aneurysm corresponds to the radiographic abnormality. According to report from outside chest CT 11/11/1998 (images not available), this abnormality was present at that time, when it measured 5 cm in diameter. This may be secondary to underlying subpulmonic stenosis. 2. No aortic dissection or acute aortic syndrome. Aortic Atherosclerosis (ICD10-I70.0). 3. Diffuse colonic diverticulosis without acute inflammation. Electronically Signed   By: Deatra Robinson M.D.   On: 04/14/2018 23:01    Procedures Procedures (including critical care time)  Medications Ordered in ED Medications  iopamidol (ISOVUE-370) 76 % injection (has no administration in time range)  sodium chloride 0.9 % bolus 250 mL (0 mLs Intravenous Stopped 04/14/18 2215)  iopamidol (ISOVUE-370) 76 % injection 100 mL (100 mLs Intravenous Contrast Given 04/14/18 2214)     Initial Impression / Assessment and Plan / ED Course  I have reviewed the triage vital signs and the nursing notes.  Pertinent labs & imaging results that were available during my care of the patient were reviewed by me and considered in my medical decision making (see chart for details).     [name] Baird is an 84 year old female with history of congenital heart disease, ASD status post patch, pulmonic stenosis status post valvotomy who presents to the ED with concerning finding on chest x-ray by PCP.  Patient with overall normal vitals.  No fever.  No hypoxia.  Patient has had URI symptoms for the last several days but denies any chest pain, shortness of breath.  Patient went to an urgent care and had chest x-ray that showed a 4 cm centimeter possible mass off of the aortic arch.  They recommended further CT evaluation for possible aneurysm versus neoplasm.  Patient has  had a prior CABG as well.  Patient overall with unremarkable exam.  No signs of volume overload on exam.  Patient had EKG performed that showed atrial fibrillation at 70 bpm.  No signs of ischemic changes.  EKG unchanged from prior.  Patient does have ICD as well.  Patient with good pulses throughout.  Will obtain basic labs and obtain CT scan dissection study of chest/abdomen and pelvis to further investigate chest x-ray findings.  CT scan showed large pulmonary artery aneurysm is 6.4 cm, this is increased from pulmonary artery aneurysm seen in 1999 that was about 5 cm with CT scan performed at [facility].  Patient also with  main pulmonary artery aneurysm measuring at about 3.8 cm.  Otherwise no signs of dissection.  Aorta is within normal limits.  Contacted Dr. Dorris Fetch with cardiothoracic surgery and they recommended discussion with [facility] or [facility] as he does not typically treat these type of problems.  Patient overall has unremarkable electrolytes, creatinine mildly elevated but at baseline. Otherwise no significant anemia.   Contacted [facility] CT surgery, Dr. Kennon Rounds, who states that patient can follow-up outpatient if wants to. He states that pulmonary artery aneurysms are rare to rupture and typically just get very large.  Given her findings and age likely there is nothing to do but is happy to see the patient outpatient if they want to.  Had a discussion with the family and the patient and they are okay with this plan.  They were given education about this aneurysm.  Patient given prescription for Tessalon Perles for cough and recommended antihistamines as well for possible allergies.  Discharged from ED in good condition and told to return to the ED if symptoms worsen.    Final Clinical Impressions(s) / ED Diagnoses   Final diagnoses:  Pulmonary artery aneurysm [facility])    ED Discharge Orders    None       Virgina Norfolk, DO 04/15/18 0136    Alvira Monday, MD 04/15/18 2228

## 2018-04-14 NOTE — ED Notes (Signed)
Spoke to patient about wait time. Patient verbalizing understanding

## 2018-04-14 NOTE — ED Notes (Signed)
ED Provider at bedside. 

## 2018-04-14 NOTE — ED Triage Notes (Signed)
Pt presents from PCP for evaluation of mass to aortic arch that was incidentally found on chest xray. Pt was at [facility] for URI symptoms and cough, had Xray. [facility]CP for ct follow up but PCP sent here.

## 2018-04-15 ENCOUNTER — Telehealth: Payer: Self-pay | Admitting: Cardiovascular Disease

## 2018-04-15 MED ORDER — BENZONATATE 100 MG PO CAPS: 100.0000 mg | ORAL_CAPSULE | Freq: Two times a day (BID) | ORAL | 0 refills | Status: DC

## 2018-04-15 NOTE — Telephone Encounter (Signed)
Returned the call to the patient's daughter per the dpr. The patient went to the ED yesterday where it was noted on the CT that her pulmonary artery aneurysm had grown from 5 cm to 6.4 cm since 1999. [facility] surgery had been consulted concerning the aneurysm.   The family would also like for Dr. Royann Shiversroitoru to review the notes for his knowledge and any further follow up appointments that may be needed.

## 2018-04-15 NOTE — Telephone Encounter (Signed)
New message   Pt son-n-law verbalized that [facility] and the family want Dr.Croitoru to   Review hospital visit pt has been recommend  to [facility] to see Dr. Betsy PriesHanley  For pulmonary embolism

## 2018-04-15 NOTE — ED Notes (Signed)
Consulting MD at bedside

## 2018-04-16 ENCOUNTER — Other Ambulatory Visit: Payer: Self-pay | Admitting: Cardiovascular Disease

## 2018-04-18 NOTE — Telephone Encounter (Signed)
Returned the call to the patient's daughter. She will call Dr. Elie Confer office to set up an appointment.   She requested a follow up appointment at this office. One has been made for 5/2 with an APP.

## 2018-04-18 NOTE — Telephone Encounter (Signed)
REFILL 

## 2018-04-18 NOTE — Telephone Encounter (Signed)
I looked at the images and I agree that the aneurysm is larger. I am inclined to recommend continued conservative management, but would be interested to listen to Dr. Norberto Sorenson advice.

## 2018-04-21 ENCOUNTER — Ambulatory Visit (INDEPENDENT_AMBULATORY_CARE_PROVIDER_SITE_OTHER): Payer: Medicare Other | Admitting: Cardiology

## 2018-04-21 ENCOUNTER — Ambulatory Visit
Admission: RE | Admit: 2018-04-21 | Discharge: 2018-04-21 | Disposition: A | Discharge status: Home / Self Care | Payer: Medicare Other | Source: Ambulatory Visit | Attending: Cardiology | Admitting: Cardiology

## 2018-04-21 VITALS — BP 160/76 | HR 70 | Ht 62.0 in | Wt 164.0 lb

## 2018-04-21 DIAGNOSIS — I480 Paroxysmal atrial fibrillation: Secondary | ICD-10-CM

## 2018-04-21 DIAGNOSIS — Z7901 Long term (current) use of anticoagulants: Secondary | ICD-10-CM

## 2018-04-21 DIAGNOSIS — R05 Cough: Secondary | ICD-10-CM | POA: Diagnosis not present

## 2018-04-21 DIAGNOSIS — Z950 Presence of cardiac pacemaker: Secondary | ICD-10-CM

## 2018-04-21 DIAGNOSIS — I281 Aneurysm of pulmonary artery: Secondary | ICD-10-CM | POA: Insufficient documentation

## 2018-04-21 DIAGNOSIS — R059 Cough, unspecified: Secondary | ICD-10-CM

## 2018-04-21 NOTE — Patient Instructions (Signed)
Medication Instructions:  Your physician recommends that you continue on your current medications as directed. Please refer to the Current Medication list given to you today.  Labwork: NONE   Testing/Procedures: A chest x-ray takes a picture of the organs and structures inside the chest, including the heart, lungs, and blood vessels. This test can show several things, including, whether the heart is enlarges; whether fluid is building up in the lungs; and whether pacemaker / defibrillator leads are still in place.   Follow-Up: Your physician recommends that you schedule a follow-up appointment in: 3 MONTHS WITH DR CROITORU.  Any Other Special Instructions Will Be Listed Below (If Applicable).  If you need a refill on your cardiac medications before your next appointment, please call your pharmacy.

## 2018-04-21 NOTE — Progress Notes (Signed)
Before referring to pulmonary, a 1-2 week trial of ranitidine 150 mg daily or omeprazole 40 mg daily is reasonable.

## 2018-04-21 NOTE — Assessment & Plan Note (Signed)
6.4 cm pulmonary artery aneurysm, incidental finding -abnormal CXR- CT 04/14/18

## 2018-04-21 NOTE — Assessment & Plan Note (Signed)
Unclear etiology- if this is related to her "massive" PA aneurysm. No CHF on exam, she does not appear to have an ongoing infection. ? possibly secondary to GERD and aspiration.

## 2018-04-21 NOTE — Assessment & Plan Note (Signed)
Low burdern per Dr Royann Shivers- currently paced

## 2018-04-21 NOTE — Progress Notes (Signed)
Thank you MCr 

## 2018-04-21 NOTE — Assessment & Plan Note (Signed)
Medtronic Adapta L. implanted 2007, Gen change 2016

## 2018-04-21 NOTE — Assessment & Plan Note (Signed)
On Xarelto 

## 2018-04-21 NOTE — Progress Notes (Signed)
04/21/2018 [name] Baird   [birth year]  [id number]  Primary Physician Marletta Lor, NP Primary Cardiologist: Dr Royann Shivers  HPI:  84 y/o female followed by Dr Royann Shivers with a history of remote ASD closure and pulmonary valvuloplasty in the 80's. She has PAF but low burden per Dr Royann Shivers. She has a MDT pacemaker.  She caries a diagnosis of diastolic CHF. Her last echo was April 2017 and showed mild LVD with an EF of 45-50% with grade 1 DD. I don't get the impression that this has been a recent problem.  The pt developed a persistent cough and was seen at an Urgent Care. CXR suggested a "mass" and she was sent to [facility] for further evaluation. 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 Lungs did not show congestion or effusion. The ED physician spoke with Dr Dorris Fetch and was referred to vascular surgery at [facility]. Dr Lockie Mola (ED physician) spoke with Dr. Melanee Spry CT surgery. He stated that patient can follow-up as an outpatient with himt if she wants to. He stated that pulmonary artery aneurysms are rare to rupture and typically just get very large.  Given her findings and age likely there is nothing to do. The pt's family did make this appointment and will be seen in May at [facility].  She is in the office today for follow up. The family's main concern is her persistent cough. Its non productive. The pt and family agree it is improving. She denies any unusual dyspnea or orthopnea. She has no LE edema. Her family does state the pt does "choke on her food" from time to time, the patient denied this.      Current Outpatient Medications  Medication Sig Dispense Refill  . acetaminophen (TYLENOL ARTHRITIS PAIN) 650 MG  CR tablet Take 1,300 mg by mouth daily as needed for pain.     . bacitracin ophthalmic ointment Place 1 application into the left eye 4 (four) times daily.  1  . benzonatate (TESSALON) 100 MG capsule Take 1 capsule (100 mg total) by mouth 2 (two) times daily. 21 capsule 0  . brimonidine (ALPHAGAN) 0.2 % ophthalmic solution Place 1 drop into the left eye 2 (two) times daily.  5  . BYSTOLIC 10 MG tablet TAKE 1 TABLET BY MOUTH EVERY DAY 30 tablet 6  . calcium-vitamin D (OSCAL) 250-125 MG-UNIT per tablet Take 1 tablet by mouth daily.    . candesartan (ATACAND) 8 MG tablet Take 1 tablet (8 mg total) by mouth daily. 30 tablet 5  . clonazePAM (KLONOPIN) 0.5 MG tablet Take 0.5 mg by mouth daily as needed.     . dorzolamide-timolol (COSOPT) 22.3-6.8 MG/ML ophthalmic solution Place 2 drops into both eyes 2 (two) times daily.    . furosemide (LASIX) 40 MG tablet TAKE 1 TABLET DAILY. MAY TAKE AN EXTRA DOSE DAILY AS NEEDED FOR WEIGHT GAIN. 180 tablet 0  . latanoprost (XALATAN) 0.005 % ophthalmic solution Place 1 drop into both eyes at bedtime.    [name] Kitchen losartan (COZAAR) 50 MG tablet TAKE 1 TABLET DAILY (KEEP OFFICE VISIT) 90 tablet 0  . Multiple Vitamins-Minerals (MULTIVITAMIN PO) Take 1 tablet by mouth daily.     [name] Kitchen ofloxacin (OCUFLOX)  0.3 % ophthalmic solution Place 1 drop into the left eye 2 (two) times daily.   0  . pantoprazole (PROTONIX) 40 MG tablet TAKE 1 TABLET BY MOUTH ONCE DAILY 90 tablet 3  . potassium chloride (K-DUR,KLOR-CON) 10 MEQ tablet Take 2 tablets (20 mEq total) by mouth 2 (two) times daily. 180 tablet 0  . Rivaroxaban (XARELTO) 15 MG TABS tablet Take 1 tablet (15 mg total) by mouth daily with supper. 90 tablet 1  . rosuvastatin (CRESTOR) 10 MG tablet Take 10 mg by mouth at bedtime.    [name] Kitchen venlafaxine XR (EFFEXOR-XR) 75 MG 24 hr capsule Take 75 mg by mouth daily.    [name] Kitchen VIGAMOX 0.5 % ophthalmic solution Place 1 application into the left eye 4 (four) times daily.     Current Facility-Administered  Medications  Medication Dose Route Frequency Provider Last Rate Last Dose  . triamcinolone acetonide (KENALOG) 10 MG/ML injection 10 mg  10 mg Other Once Lenn Sink, DPM        Allergies  Allergen Reactions  . Sulfa Antibiotics Other (See Comments)    welps   . Hydrocodone Nausea Only    nausea    Past Medical History:  Diagnosis Date  . Anxiety   . Atrial flutter (HCC)    s/p ablation 06/29/2013 by Dr Johney Frame  . CHF (congestive heart failure) (HCC)   . Complete heart block (HCC) 2007   s/p medtronic pacemaker implantation  . Depression   . Glaucoma   . Hypertension   . Pulmonary stenosis sp valvotomy       . Shortness of breath   . Status post patch closure of ASD     Social History   Socioeconomic History  . Marital status: Widowed    Spouse name: Not on file  . Number of children: 2  . Years of education: Not on file  . Highest education level: Not on file  Occupational History  . Not on file  Social Needs  . Financial resource strain: Not on file  . Food insecurity:    Worry: Not on file    Inability: Not on file  . Transportation needs:    Medical: Not on file    Non-medical: Not on file  Tobacco Use  . Smoking status: Never Smoker  . Smokeless tobacco: Never Used  Substance and Sexual Activity  . Alcohol use: No  . Drug use: No  . Sexual activity: Never  Lifestyle  . Physical activity:    Days per week: Not on file    Minutes per session: Not on file  . Stress: Not on file  Relationships  . Social connections:    Talks on phone: Not on file    Gets together: Not on file    Attends religious service: Not on file    Active member of club or organization: Not on file    Attends meetings of clubs or organizations: Not on file    Relationship status: Not on file  . Intimate partner violence:    Fear of current or ex partner: Not on file    Emotionally abused: Not on file    Physically abused: Not on file    Forced sexual activity: Not on  file  Other Topics Concern  . Not on file  Social History Narrative  . Not on file     No family history on file.   Review of Systems: General: negative for chills, fever, night sweats or weight changes.  Cardiovascular: negative for chest pain, dyspnea on exertion, edema, orthopnea, palpitations, paroxysmal nocturnal dyspnea or shortness of breath Dermatological: negative for rash Respiratory: negative for cough or wheezing Urologic: negative for hematuria Abdominal: negative for nausea, vomiting, diarrhea, bright red blood per rectum, melena, or hematemesis Neurologic: negative for visual changes, syncope, or dizziness All other systems reviewed and are otherwise negative except as noted above.    Blood pressure (!) 160/76, pulse 70, height  (1.575 m), weight 164 lb (74.4 kg), SpO2 97 %.  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 No CHF on exam, she does not appear to have an ongoing infection. ? possibly secondary to GERD and aspiration.   Pacemaker Medtronic Adapta L. implanted 2007, Gen change 2016  PAF (paroxysmal atrial fibrillation) (HCC) Low burdern per Dr Royann Shivers- currently paced  Long term current use of anticoagulant therapy On Xarelto   PLAN  CXR today- r/o CHF. Fortunately her cough seems to be improving. I suggested she continue Mucinex for 7 day and try a short course of Robitussin DM and hold off on the Tessalon pearls. Will review with Dr Royann Shivers- ? Consider pulmonary referral if she doesn't improve.    Corine Shelter PA-C 04/21/2018 2:03 PM

## 2018-04-26 ENCOUNTER — Ambulatory Visit (INDEPENDENT_AMBULATORY_CARE_PROVIDER_SITE_OTHER): Payer: Medicare Other | Admitting: *Deleted

## 2018-04-26 DIAGNOSIS — I442 Atrioventricular block, complete: Secondary | ICD-10-CM

## 2018-04-26 NOTE — Progress Notes (Signed)
Remote pacemaker transmission.   

## 2018-04-27 ENCOUNTER — Encounter: Payer: Self-pay | Admitting: Cardiology

## 2018-05-10 DIAGNOSIS — R109 Unspecified abdominal pain: Secondary | ICD-10-CM | POA: Insufficient documentation

## 2018-05-13 LAB — CUP PACEART REMOTE DEVICE CHECK
Battery Impedance: 208 Ohm
Battery Remaining Longevity: 100 mo
Battery Voltage: 2.78 V
Brady Statistic AP VP Percent: 86 %
Brady Statistic AP VS Percent: 0 %
Brady Statistic AS VP Percent: 14 %
Brady Statistic AS VS Percent: 0 %
Date Time Interrogation Session: 20190507113145
Implantable Lead Implant Date: 20070920
Implantable Lead Implant Date: 20070920
Implantable Lead Location: 753859
Implantable Lead Location: 753860
Implantable Lead Model: 4592
Implantable Lead Model: 5092
Implantable Pulse Generator Implant Date: 20160707
Lead Channel Impedance Value: 495 Ohm
Lead Channel Impedance Value: 794 Ohm
Lead Channel Pacing Threshold Amplitude: 0.625 V
Lead Channel Pacing Threshold Amplitude: 1.375 V
Lead Channel Pacing Threshold Pulse Width: 0.4 ms
Lead Channel Pacing Threshold Pulse Width: 0.4 ms
Lead Channel Setting Pacing Amplitude: 2 V
Lead Channel Setting Pacing Amplitude: 2.75 V
Lead Channel Setting Pacing Pulse Width: 0.4 ms
Lead Channel Setting Sensing Sensitivity: 5.6 mV

## 2018-06-29 ENCOUNTER — Telehealth: Payer: Self-pay | Admitting: Cardiovascular Disease

## 2018-06-29 NOTE — Telephone Encounter (Signed)
[facility] on 06/29/18, Appt 08/08/18 @ 9:40AM. NV

## 2018-07-17 ENCOUNTER — Other Ambulatory Visit: Payer: Self-pay | Admitting: Cardiovascular Disease

## 2018-07-19 ENCOUNTER — Telehealth: Payer: Self-pay | Admitting: Cardiovascular Disease

## 2018-07-19 NOTE — Telephone Encounter (Signed)
New Message:       Pt's daughter is calling about some pre-authorization papers for some medication she sent over in June 2019

## 2018-07-19 NOTE — Telephone Encounter (Signed)
Rx request sent to pharmacy.  

## 2018-07-19 NOTE — Telephone Encounter (Signed)
Pt is going to refax forms for prior authorization for her Bystolic.[name] Kitchen.[name] Kitchen.#21 samples given to pt to hold her over until the forms are completed.

## 2018-07-26 ENCOUNTER — Other Ambulatory Visit: Payer: Self-pay | Admitting: Cardiovascular Disease

## 2018-07-26 ENCOUNTER — Ambulatory Visit: Payer: Medicare Other | Admitting: *Deleted

## 2018-07-26 DIAGNOSIS — Z950 Presence of cardiac pacemaker: Secondary | ICD-10-CM

## 2018-07-26 MED ORDER — NEBIVOLOL HCL 10 MG PO TABS: 10.0000 mg | ORAL_TABLET | Freq: Every day | ORAL | 6 refills | Status: DC

## 2018-07-26 NOTE — Telephone Encounter (Signed)
Rx request sent to pharmacy.  

## 2018-07-26 NOTE — Telephone Encounter (Signed)
New Message      *STAT* If patient is at the pharmacy, call can be transferred to refill team.   1. Which medications need to be refilled? (please list name of each medication and dose if known) BYSTOLIC 10 MG tablet and potassium chloride (K-DUR,KLOR-CON) 10 MEQ tablet  2. Which pharmacy/location (including street and city if local pharmacy) is medication to be sent to? [id number] [address], [place] - [address]  3. Do they need a 30 day or 90 day supply? 30

## 2018-07-27 ENCOUNTER — Telehealth: Payer: Self-pay

## 2018-07-27 MED ORDER — POTASSIUM CHLORIDE CRYS ER 10 MEQ PO TBCR: 20.0000 meq | EXTENDED_RELEASE_TABLET | Freq: Two times a day (BID) | ORAL | 0 refills | Status: DC

## 2018-07-27 MED ORDER — POTASSIUM CHLORIDE CRYS ER 10 MEQ PO TBCR: 20.0000 meq | EXTENDED_RELEASE_TABLET | Freq: Two times a day (BID) | ORAL | 2 refills | Status: DC

## 2018-07-27 NOTE — Telephone Encounter (Signed)
Returned call to daughter. She states patient needs potassium refilled. Explained it was refilled to [facility] but not for correct quantity. Advised will update Rx at [facility] and [facility] per [facility].   She also states patient needs prior auth for bystolic. [facility] early July and also on 7/30 (per phone note). Patient was provided with samples on this day. Asked that she re-fax paperwork to my attention as Chelley CMA is out of the office.   She also states patient had to r/s home device check for 8/28.

## 2018-07-27 NOTE — Telephone Encounter (Signed)
Pt daughter [name] have medication concerns and will Like to talk to Dr. Royann Shiversroitoru nurse. Her concern Potassium. Please call her as soon as possible. [phone]

## 2018-07-27 NOTE — Telephone Encounter (Signed)
See note from 07/27/18

## 2018-07-28 NOTE — Telephone Encounter (Signed)
Received fax from daughter & contacted dispensing pharmacy. It was determined that patient needs a prior auth for bystolic 10mg .   ID: [id number] BIN: [id number] PCN: [id number] RxGrp: [id number]  Patient has had fatigue with metoprolol, felt sluggish, weak, SOB on carvedilol  Initiated PA on [email or web address] and am awaiting clinical questions.  KEY: [id number]

## 2018-07-29 NOTE — Telephone Encounter (Signed)
Approved today  CaseId: [id number] Status: Approved Review Type: Prior Auth Coverage Start Date: 06/29/2018 Coverage End Date: 07/29/2019

## 2018-07-29 NOTE — Telephone Encounter (Signed)
Patient's daughter notified that bystolic PA was approved.

## 2018-08-02 ENCOUNTER — Encounter: Payer: Self-pay | Admitting: Cardiology

## 2018-08-02 NOTE — Progress Notes (Signed)
Opened in error

## 2018-08-08 ENCOUNTER — Encounter: Payer: Self-pay | Admitting: Cardiovascular Disease

## 2018-08-08 ENCOUNTER — Ambulatory Visit (INDEPENDENT_AMBULATORY_CARE_PROVIDER_SITE_OTHER): Payer: Medicare Other | Admitting: Cardiovascular Disease

## 2018-08-08 VITALS — BP 140/72 | HR 98 | Ht 62.0 in | Wt 165.4 lb

## 2018-08-08 DIAGNOSIS — I442 Atrioventricular block, complete: Secondary | ICD-10-CM

## 2018-08-08 DIAGNOSIS — I281 Aneurysm of pulmonary artery: Secondary | ICD-10-CM

## 2018-08-08 DIAGNOSIS — Z7901 Long term (current) use of anticoagulants: Secondary | ICD-10-CM

## 2018-08-08 DIAGNOSIS — I484 Atypical atrial flutter: Secondary | ICD-10-CM | POA: Diagnosis not present

## 2018-08-08 DIAGNOSIS — Z950 Presence of cardiac pacemaker: Secondary | ICD-10-CM

## 2018-08-08 DIAGNOSIS — E7800 Pure hypercholesterolemia, unspecified: Secondary | ICD-10-CM

## 2018-08-08 DIAGNOSIS — Z8774 Personal history of (corrected) congenital malformations of heart and circulatory system: Secondary | ICD-10-CM

## 2018-08-08 DIAGNOSIS — Q221 Congenital pulmonary valve stenosis: Secondary | ICD-10-CM

## 2018-08-08 DIAGNOSIS — I5032 Chronic diastolic (congestive) heart failure: Secondary | ICD-10-CM | POA: Diagnosis not present

## 2018-08-08 NOTE — Progress Notes (Signed)
ROS  Patient ID: [name] Baird, female   DOB: 1933-12-03, 84 y.o.   MRN: [id number]    Cardiology Office Note    Date:  08/08/2018   ID:  [name] Baird, DOB 1933-12-03, MRN [id number]  PCP:  Marletta LorBarr, Julie, NP  Cardiologist:   [name] FairMihai Tyberius Ryner, MD   No chief complaint on file.   History of Present Illness:  [name] Baird is a 84 y.o. female with a history of congenital heart disease (atrial septal defect status post patch closure, pulmonic stenosis status post valvotomy), left pulmonary artery aneurysm, complete heart block status post dual-chamber permanent pacemaker implantation and recurrent problems with paroxysmal atrial flutter and atrial fibrillation on Xarelto. She has history of diastolic heart failure without recent exacerbation.  In May she saw [facility] Lowanda FosterBrittany Baird [facility] to discuss treatment for pulmonary artery aneurysm.  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  Conservative management was recommended.  Echo performed there showed a left ventricular ejection fraction of 45-50%, severe left atrial enlargement, moderate regurgitation of all 4 cardiac valves, in addition to the dilated pulmonary artery and left pulmonary artery aneurysm.  She has not had any problems with shortness of breath, palpitations, syncope, chest pain.  Her cough has subsided.  She occasionally gets "strangled" when she eats too fast has problems with acid reflux, mostly controlled with PPI.  Denies leg edema, orthopnea, PND, hemoptysis or any other bleeding problems or focal neurological events.  She has not needed extra diuretics in a very long time.  Comprehensive pacemaker interrogation shows normal function.  Her current generator was implanted in 2016 (leads from 2007 since and has an estimated longevity of another 8 years.  Lead parameters are excellent.  She has 86% atrial pacing and 100% ventricular pacing.  She has not had  any atrial fibrillation in the last 6 months. In the last 3 years she is only had a couple of episodes of atrial fibrillation.  In April 2017 she had one episode that lasted for 6 hours.  In March 2018 the atrial fibrillation only lasted for just over 2 minutes.  She had a flutter ablation performed by Dr. Johney Baird in July 2014 and is not taking any antiarrhythmics at this time, other than a low dose of beta blocker. At the time of the EP study she was noted to have conventional counterclockwise isthmus-dependent right atrial flutter but then a second type of right atrial flutter was noted briefly. It could not be reinduced for full study.    Past Medical History:  Diagnosis Date  . Anxiety   . Atrial flutter (HCC)    Baird/p ablation 06/29/2013 by Dr [name] Baird  . CHF (congestive heart failure) (HCC)   . Complete heart block (HCC) 2007   Baird/p medtronic pacemaker implantation  . Depression   . Glaucoma   . Hypertension   . Pulmonary stenosis sp valvotomy       . Shortness of breath   . Status post patch closure of ASD     Past Surgical History:  Procedure Laterality Date  . ABLATION  06/29/2013   Baird/p isthmus ablation for atrial flutter by Dr [name] Baird  . AORTIC VALVE REPAIR    . ATRIAL FLUTTER ABLATION N/A 06/29/2013   Procedure: ATRIAL FLUTTER ABLATION;  Surgeon: [name] RangeJames Allred, MD;  [facility];  Service: Cardiovascular;  Laterality: N/A;  . CARDIAC CATHETERIZATION N/A 06/27/2015  Procedure: Temporary Pacemaker;  Surgeon: [name] FairMihai Kayia Billinger, MD;  Location: [facility] INVASIVE CV LAB;  Service: Cardiovascular;  Laterality: N/A;  . CARDIOVERSION N/A 03/24/2013   Procedure: CARDIOVERSION;  Surgeon: [name] FairMihai Ericca Labra, MD;  Location: [facility] ENDOSCOPY;  Service: Cardiovascular;  Laterality: N/A;  . EP IMPLANTABLE DEVICE N/A 06/27/2015   Procedure: PPM Generator Changeout;  Surgeon: [name] FairMihai Braylen Staller, MD;  Location: [facility] INVASIVE CV LAB;  Service: Cardiovascular;  Laterality: N/A;  . PACEMAKER INSERTION  2007   Medtronic  Adapta ADDRL1, serial # [id number]  . pulmonary valvotomy  1985  . trigeminal sx     left side    Outpatient Medications Prior to Visit  Medication Sig Dispense Refill  . acetaminophen (TYLENOL ARTHRITIS PAIN) 650 MG CR tablet Take 1,300 mg by mouth daily as needed for pain.     . bacitracin ophthalmic ointment Place 1 application into the left eye 4 (four) times daily.  1  . brimonidine (ALPHAGAN) 0.2 % ophthalmic solution Place 1 drop into both eyes 2 (two) times daily.   5  . calcium-vitamin D (OSCAL) 250-125 MG-UNIT per tablet Take 1 tablet by mouth daily.    . candesartan (ATACAND) 8 MG tablet Take 1 tablet (8 mg total) by mouth daily. 30 tablet 5  . clonazePAM (KLONOPIN) 0.5 MG tablet Take 0.5 mg by mouth daily as needed.     . dorzolamide-timolol (COSOPT) 22.3-6.8 MG/ML ophthalmic solution Place 2 drops into both eyes 2 (two) times daily.    . furosemide (LASIX) 40 MG tablet TAKE 1 TABLET DAILY. MAY TAKE AN EXTRA DOSE DAILY AS NEEDED FOR WEIGHT GAIN. 180 tablet 0  . latanoprost (XALATAN) 0.005 % ophthalmic solution Place 1 drop into both eyes at bedtime.    [name] Kitchen. losartan (COZAAR) 50 MG tablet TAKE 1 TABLET DAILY (KEEP OFFICE VISIT) 90 tablet 0  . Multiple Vitamins-Minerals (MULTIVITAMIN PO) Take 1 tablet by mouth daily.     . nebivolol (BYSTOLIC) 10 MG tablet Take 1 tablet (10 mg total) by mouth daily. 30 tablet 6  . pantoprazole (PROTONIX) 40 MG tablet TAKE 1 TABLET BY MOUTH ONCE DAILY 90 tablet 3  . potassium chloride (K-DUR,KLOR-CON) 10 MEQ tablet Take 2 tablets (20 mEq total) by mouth 2 (two) times daily. Take additional tablet with additional lasix 120 tablet 0  . Rivaroxaban (XARELTO) 15 MG TABS tablet Take 1 tablet (15 mg total) by mouth daily with supper. 90 tablet 1  . rosuvastatin (CRESTOR) 10 MG tablet Take 10 mg by mouth at bedtime.    [name] Kitchen. venlafaxine XR (EFFEXOR-XR) 75 MG 24 hr capsule Take 75 mg by mouth daily.    . benzonatate (TESSALON) 100 MG capsule Take 1 capsule (100 mg  total) by mouth 2 (two) times daily. (Patient not taking: Reported on 08/08/2018) 21 capsule 0  . ofloxacin (OCUFLOX) 0.3 % ophthalmic solution Place 1 drop into the left eye 2 (two) times daily.   0  . VIGAMOX 0.5 % ophthalmic solution Place 1 application into the left eye 4 (four) times daily.     Facility-Administered Medications Prior to Visit  Medication Dose Route Frequency Provider Last Rate Last Dose  . triamcinolone acetonide (KENALOG) 10 MG/ML injection 10 mg  10 mg Other Once [name] Baird, [name] Baird, DPM         Allergies:   Sulfa antibiotics and Hydrocodone   Social History   Socioeconomic History  . Marital status: Widowed    Spouse name: Not on file  . Number of children: 2  .  Years of education: Not on file  . Highest education level: Not on file  Occupational History  . Not on file  Social Needs  . Financial resource strain: Not on file  . Food insecurity:    Worry: Not on file    Inability: Not on file  . Transportation needs:    Medical: Not on file    Non-medical: Not on file  Tobacco Use  . Smoking status: Never Smoker  . Smokeless tobacco: Never Used  Substance and Sexual Activity  . Alcohol use: No  . Drug use: No  . Sexual activity: Never  Lifestyle  . Physical activity:    Days per week: Not on file    Minutes per session: Not on file  . Stress: Not on file  Relationships  . Social connections:    Talks on phone: Not on file    Gets together: Not on file    Attends religious service: Not on file    Active member of club or organization: Not on file    Attends meetings of clubs or organizations: Not on file    Relationship status: Not on file  Other Topics Concern  . Not on file  Social History Narrative  . Not on file    ROS:   Please see the history of present illness.    ROS All other systems reviewed and are negative.   PHYSICAL EXAM:   VS:  BP 140/72   Pulse 98   Wt 165 lb 6.4 oz (75 kg)   BMI 30.25 kg/m     General: Alert,  oriented x3, no distress, borderline obese. 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, PERRL, EOMI, no exophtalmos or lid lag, no myxedema, no xanthelasma; normal ears, nose and oropharynx Neck: normal jugular venous pulsations and no hepatojugular reflux; brisk carotid pulses without delay and no carotid bruits Chest: clear to auscultation, no signs of consolidation by percussion or palpation, normal fremitus, symmetrical and full respiratory excursions, healthy PM site Cardiovascular: normal position and quality of the apical impulse, regular rhythm, normal first and paradoxically split second heart sounds, no murmurs, rubs or gallops Abdomen: no tenderness or distention, no masses by palpation, no abnormal pulsatility or arterial bruits, normal bowel sounds, no hepatosplenomegaly Extremities: no clubbing, cyanosis or edema; 2+ radial, ulnar and brachial pulses bilaterally; 2+ right femoral, posterior tibial and dorsalis pedis pulses; 2+ left femoral, posterior tibial and dorsalis pedis pulses; no subclavian or femoral bruits Neurological: grossly nonfocal Psych: Normal mood and affect  Wt Readings from Last 3 Encounters:  08/08/18 165 lb 6.4 oz (75 kg)  04/21/18 164 lb (74.4 kg)  11/03/17 163 lb 3.2 oz (74 kg)    Studies/Labs Reviewed:   EKG:  EKG is ordered today.  ECG shows 100% AV sequential pacing  ASSESSMENT:    1. Chronic diastolic heart failure (HCC)   2. Complete heart block (HCC)   3. Pacemaker   4. Atypical atrial flutter (HCC)   5. Hypercholesterolemia   6. Pulmonic stenosis, congenital   7. History of repair of congenital atrial septal defect (ASD)   8. Aneurysm of left pulmonary artery (HCC)   9. Long term (current) use of anticoagulants     PLAN:  In order of problems listed above:  1. CHF, diastolic: borderline LV dysfunction is likely RV pacing related. NYHA class I. Euvolemic. 2. CHB: Pacemaker dependent without  any escape rhythm and very poorly tolerant of threshold  testing 3. PPM: Recheck remote download in 3 months and office visit yearly 4. PAFlutter/ Afib: The burden of atrial fibrillation is extremely low.  The arrhythmia has always been asymptomatic.  She is on appropriate anticoagulation. CHADSVasc4 (age 25, gender, HF). 5. HLP: Recheck lipids in November 6. PS Baird/p surgical valvotomy: No residual gradient, mild to moderate regurgitation by echo 7. ASD Baird/p patch closure: No residual shunt. 8. L PA aneurysm: 6.4 cm. Conservative, non-surgical management. 9. Xarelto: No bleeding complications.     Medication Adjustments/Labs and Tests Ordered: Current medicines are reviewed at length with the patient today.  Concerns regarding medicines are outlined above.  Medication changes, Labs and Tests ordered today are listed in the Patient Instructions below. Patient Instructions  Dr Royann Shivers recommends that you continue on your current medications as directed. Please refer to the Current Medication list given to you today.  Remote monitoring is used to monitor your Pacemaker or ICD from home. This monitoring reduces the number of office visits required to check your device to one time per year. It allows [place] to keep an eye on the functioning of your device to ensure it is working properly. You are scheduled for a device check from home on Wednesday, August 28th, 2019. You may send your transmission at any time that day. If you have a wireless device, the transmission will be sent automatically. After your physician reviews your transmission, you will receive a notification with your next transmission date.  To improve our patient care and to more adequately follow your device, [facility] has decided, as a practice, to start following each patient four times a year with your home monitor. This means that you may experience a remote appointment that is close to an in-office appointment with your physician.  Your insurance will apply at the same rate as other remote monitoring transmissions.  Dr Royann Shivers recommends that you schedule a follow-up appointment in 12 months with a pacemaker check. You will receive a reminder letter in the mail two months in advance. If you don't receive a letter, please call our office to schedule the follow-up appointment.  If you need a refill on your cardiac medications before your next appointment, please call your pharmacy.   Signed, [name] Fair, MD  08/08/2018 10:18 AM    [facility] [address] [place], [place], [place]  [address] Phone: [phone]; Fax: [phone]

## 2018-08-08 NOTE — Patient Instructions (Signed)
Dr Royann Shiversroitoru recommends that you continue on your current medications as directed. Please refer to the Current Medication list given to you today.  Remote monitoring is used to monitor your Pacemaker or ICD from home. This monitoring reduces the number of office visits required to check your device to one time per year. It allows us to keep an eye on the functioning of your device to ensure it is working properly. You are scheduled for a device check from home on Wednesday, August 28th, 2019. You may send your transmission at any time that day. If you have a wireless device, the transmission will be sent automatically. After your physician reviews your transmission, you will receive a notification with your next transmission date.  To improve our patient care and to more adequately follow your device, [facility] has decided, as a practice, to start following each patient four times a year with your home monitor. This means that you may experience a remote appointment that is close to an in-office appointment with your physician. Your insurance will apply at the same rate as other remote monitoring transmissions.  Dr Royann Shiversroitoru recommends that you schedule a follow-up appointment in 12 months with a pacemaker check. You will receive a reminder letter in the mail two months in advance. If you don't receive a letter, please call our office to schedule the follow-up appointment.  If you need a refill on your cardiac medications before your next appointment, please call your pharmacy.

## 2018-08-17 ENCOUNTER — Ambulatory Visit (INDEPENDENT_AMBULATORY_CARE_PROVIDER_SITE_OTHER): Payer: Medicare Other | Admitting: *Deleted

## 2018-08-17 DIAGNOSIS — I442 Atrioventricular block, complete: Secondary | ICD-10-CM

## 2018-08-17 NOTE — Progress Notes (Signed)
Remote pacemaker transmission.   

## 2018-08-19 ENCOUNTER — Other Ambulatory Visit: Payer: Self-pay | Admitting: Cardiovascular Disease

## 2018-08-19 NOTE — Telephone Encounter (Signed)
Rx sent to pharmacy   

## 2018-08-28 ENCOUNTER — Other Ambulatory Visit: Payer: Self-pay | Admitting: Cardiovascular Disease

## 2018-09-02 ENCOUNTER — Other Ambulatory Visit: Payer: Self-pay

## 2018-09-02 MED ORDER — RIVAROXABAN 15 MG PO TABS: 15.0000 mg | ORAL_TABLET | Freq: Every day | ORAL | 1 refills | Status: DC

## 2018-09-06 LAB — CUP PACEART REMOTE DEVICE CHECK
Battery Impedance: 232 Ohm
Battery Remaining Longevity: 98 mo
Battery Voltage: 2.78 V
Brady Statistic AP VP Percent: 86 %
Brady Statistic AP VS Percent: 0 %
Brady Statistic AS VP Percent: 13 %
Brady Statistic AS VS Percent: 0 %
Date Time Interrogation Session: 20190828111025
Implantable Lead Implant Date: 20070920
Implantable Lead Implant Date: 20070920
Implantable Lead Location: 753859
Implantable Lead Location: 753860
Implantable Lead Model: 4592
Implantable Lead Model: 5092
Implantable Pulse Generator Implant Date: 20160707
Lead Channel Impedance Value: 503 Ohm
Lead Channel Impedance Value: 815 Ohm
Lead Channel Pacing Threshold Amplitude: 0.625 V
Lead Channel Pacing Threshold Amplitude: 1.375 V
Lead Channel Pacing Threshold Pulse Width: 0.4 ms
Lead Channel Pacing Threshold Pulse Width: 0.4 ms
Lead Channel Setting Pacing Amplitude: 2 V
Lead Channel Setting Pacing Amplitude: 2.75 V
Lead Channel Setting Pacing Pulse Width: 0.4 ms
Lead Channel Setting Sensing Sensitivity: 5.6 mV

## 2018-09-14 ENCOUNTER — Telehealth: Payer: Self-pay | Admitting: Cardiology

## 2018-09-14 NOTE — Telephone Encounter (Signed)
1. What dental office are you calling from? Dr.Rehm  2. What is your office phone number? [phone]  3. What is your fax number?[phone]  4. What type of procedure is the patient having performed? Tooth 3 actractions  5. What date is procedure scheduled or is the patient there now? TBD  6. What is your question (ex. Antibiotics prior to procedure, holding medication-we need to know how long dentist wants pt to hold med)? xarelto

## 2018-09-15 NOTE — Telephone Encounter (Signed)
Dr. Royann Shivers - pt needs 3 teeth extracted, ? SBE prophylaxis  Pharm 3 teeth to be extracted, please address Xarelto thanks.[name] Kitchen

## 2018-09-15 NOTE — Telephone Encounter (Signed)
She can stop Xarelto for 48 hours (GFR is <50). No need for ABx prophylaxis. Thanks [employer]

## 2018-10-15 ENCOUNTER — Other Ambulatory Visit: Payer: Self-pay | Admitting: Cardiovascular Disease

## 2018-11-16 ENCOUNTER — Other Ambulatory Visit: Payer: Self-pay | Admitting: Cardiovascular Disease

## 2018-11-16 ENCOUNTER — Ambulatory Visit (INDEPENDENT_AMBULATORY_CARE_PROVIDER_SITE_OTHER): Payer: Medicare Other

## 2018-11-16 DIAGNOSIS — I480 Paroxysmal atrial fibrillation: Secondary | ICD-10-CM

## 2018-11-16 DIAGNOSIS — I442 Atrioventricular block, complete: Secondary | ICD-10-CM

## 2018-11-16 NOTE — Progress Notes (Signed)
Remote pacemaker transmission.   

## 2019-01-06 LAB — CUP PACEART REMOTE DEVICE CHECK
Battery Impedance: 256 Ohm
Battery Remaining Longevity: 95 mo
Battery Voltage: 2.78 V
Brady Statistic AP VP Percent: 85 %
Brady Statistic AP VS Percent: 0 %
Brady Statistic AS VP Percent: 15 %
Brady Statistic AS VS Percent: 0 %
Date Time Interrogation Session: 20191127122521
Implantable Lead Implant Date: 20070920
Implantable Lead Implant Date: 20070920
Implantable Lead Location: 753859
Implantable Lead Location: 753860
Implantable Lead Model: 4592
Implantable Lead Model: 5092
Implantable Pulse Generator Implant Date: 20160707
Lead Channel Impedance Value: 503 Ohm
Lead Channel Impedance Value: 866 Ohm
Lead Channel Pacing Threshold Amplitude: 0.75 V
Lead Channel Pacing Threshold Amplitude: 1.375 V
Lead Channel Pacing Threshold Pulse Width: 0.4 ms
Lead Channel Pacing Threshold Pulse Width: 0.4 ms
Lead Channel Setting Pacing Amplitude: 2 V
Lead Channel Setting Pacing Amplitude: 2.75 V
Lead Channel Setting Pacing Pulse Width: 0.4 ms
Lead Channel Setting Sensing Sensitivity: 5.6 mV

## 2019-01-19 ENCOUNTER — Other Ambulatory Visit: Payer: Self-pay | Admitting: Cardiovascular Disease

## 2019-02-15 ENCOUNTER — Other Ambulatory Visit: Payer: Self-pay | Admitting: Cardiovascular Disease

## 2019-02-15 ENCOUNTER — Ambulatory Visit (INDEPENDENT_AMBULATORY_CARE_PROVIDER_SITE_OTHER): Payer: Medicare Other | Admitting: *Deleted

## 2019-02-15 DIAGNOSIS — I442 Atrioventricular block, complete: Secondary | ICD-10-CM | POA: Diagnosis not present

## 2019-02-15 DIAGNOSIS — I495 Sick sinus syndrome: Secondary | ICD-10-CM

## 2019-02-16 LAB — CUP PACEART REMOTE DEVICE CHECK
Battery Impedance: 280 Ohm
Battery Remaining Longevity: 92 mo
Battery Voltage: 2.78 V
Brady Statistic AP VP Percent: 84 %
Brady Statistic AP VS Percent: 0 %
Brady Statistic AS VP Percent: 16 %
Brady Statistic AS VS Percent: 0 %
Date Time Interrogation Session: 20200226121601
Implantable Lead Implant Date: 20070920
Implantable Lead Implant Date: 20070920
Implantable Lead Location: 753859
Implantable Lead Location: 753860
Implantable Lead Model: 4592
Implantable Lead Model: 5092
Implantable Pulse Generator Implant Date: 20160707
Lead Channel Impedance Value: 495 Ohm
Lead Channel Impedance Value: 807 Ohm
Lead Channel Pacing Threshold Amplitude: 0.625 V
Lead Channel Pacing Threshold Amplitude: 1.375 V
Lead Channel Pacing Threshold Pulse Width: 0.4 ms
Lead Channel Pacing Threshold Pulse Width: 0.4 ms
Lead Channel Setting Pacing Amplitude: 2 V
Lead Channel Setting Pacing Amplitude: 2.75 V
Lead Channel Setting Pacing Pulse Width: 0.4 ms
Lead Channel Setting Sensing Sensitivity: 5.6 mV

## 2019-02-22 ENCOUNTER — Other Ambulatory Visit: Payer: Self-pay | Admitting: Adult Health

## 2019-02-22 DIAGNOSIS — Z1231 Encounter for screening mammogram for malignant neoplasm of breast: Secondary | ICD-10-CM

## 2019-02-22 NOTE — Progress Notes (Signed)
Remote pacemaker transmission.   

## 2019-04-03 ENCOUNTER — Ambulatory Visit: Payer: Medicare Other

## 2019-04-15 ENCOUNTER — Other Ambulatory Visit: Payer: Self-pay | Admitting: Cardiovascular Disease

## 2019-05-18 ENCOUNTER — Ambulatory Visit (INDEPENDENT_AMBULATORY_CARE_PROVIDER_SITE_OTHER): Payer: Medicare Other | Admitting: *Deleted

## 2019-05-18 DIAGNOSIS — I442 Atrioventricular block, complete: Secondary | ICD-10-CM

## 2019-05-18 LAB — CUP PACEART REMOTE DEVICE CHECK
Battery Impedance: 305 Ohm
Battery Remaining Longevity: 89 mo
Battery Voltage: 2.78 V
Brady Statistic AP VP Percent: 85 %
Brady Statistic AP VS Percent: 0 %
Brady Statistic AS VP Percent: 15 %
Brady Statistic AS VS Percent: 0 %
Date Time Interrogation Session: 20200528104802
Implantable Lead Implant Date: 20070920
Implantable Lead Implant Date: 20070920
Implantable Lead Location: 753859
Implantable Lead Location: 753860
Implantable Lead Model: 4592
Implantable Lead Model: 5092
Implantable Pulse Generator Implant Date: 20160707
Lead Channel Impedance Value: 495 Ohm
Lead Channel Impedance Value: 810 Ohm
Lead Channel Pacing Threshold Amplitude: 0.625 V
Lead Channel Pacing Threshold Amplitude: 1.375 V
Lead Channel Pacing Threshold Pulse Width: 0.4 ms
Lead Channel Pacing Threshold Pulse Width: 0.4 ms
Lead Channel Setting Pacing Amplitude: 2 V
Lead Channel Setting Pacing Amplitude: 2.75 V
Lead Channel Setting Pacing Pulse Width: 0.4 ms
Lead Channel Setting Sensing Sensitivity: 5.6 mV

## 2019-05-24 ENCOUNTER — Encounter: Payer: Self-pay | Admitting: Cardiology

## 2019-05-24 NOTE — Progress Notes (Signed)
Remote pacemaker transmission.   

## 2019-05-28 ENCOUNTER — Other Ambulatory Visit: Payer: Self-pay | Admitting: Cardiovascular Disease

## 2019-05-31 ENCOUNTER — Other Ambulatory Visit: Payer: Self-pay

## 2019-05-31 ENCOUNTER — Ambulatory Visit
Admission: RE | Admit: 2019-05-31 | Discharge: 2019-05-31 | Disposition: A | Discharge status: Home / Self Care | Payer: Medicare Other | Source: Ambulatory Visit | Attending: Adult Health | Admitting: Adult Health

## 2019-05-31 DIAGNOSIS — Z1231 Encounter for screening mammogram for malignant neoplasm of breast: Secondary | ICD-10-CM

## 2019-07-17 ENCOUNTER — Other Ambulatory Visit: Payer: Self-pay | Admitting: *Deleted

## 2019-07-17 MED ORDER — RIVAROXABAN 15 MG PO TABS: 15.0000 mg | ORAL_TABLET | Freq: Every day | ORAL | 0 refills | Status: DC

## 2019-08-17 ENCOUNTER — Telehealth: Payer: Self-pay | Admitting: Cardiovascular Disease

## 2019-08-17 ENCOUNTER — Telehealth: Payer: Self-pay

## 2019-08-17 NOTE — Telephone Encounter (Signed)
The pt daughter states [employer] is sending her a new monitor and will receive the monitor in 7-10 business days.

## 2019-08-17 NOTE — Telephone Encounter (Signed)
  Daughter will need to come with patient to assist her

## 2019-08-17 NOTE — Telephone Encounter (Signed)
Yes, please let her come with the patient.

## 2019-08-17 NOTE — Telephone Encounter (Signed)
Okay for daughter to come to appointment to assist mother.  Thank you

## 2019-08-17 NOTE — Telephone Encounter (Signed)
Called and notified patient daughter okay to come to visit.  She also stated that they were not able to do the remote transmission this morning, I will let Lattie Haw, RN know.

## 2019-08-18 NOTE — Telephone Encounter (Signed)
Noted! Thank you

## 2019-08-21 ENCOUNTER — Encounter: Payer: Self-pay | Admitting: Cardiovascular Disease

## 2019-08-21 ENCOUNTER — Ambulatory Visit (INDEPENDENT_AMBULATORY_CARE_PROVIDER_SITE_OTHER): Payer: Medicare Other | Admitting: Cardiovascular Disease

## 2019-08-21 ENCOUNTER — Other Ambulatory Visit: Payer: Self-pay

## 2019-08-21 VITALS — BP 131/77 | HR 76 | Ht 62.0 in | Wt 165.0 lb

## 2019-08-21 DIAGNOSIS — E782 Mixed hyperlipidemia: Secondary | ICD-10-CM

## 2019-08-21 DIAGNOSIS — I442 Atrioventricular block, complete: Secondary | ICD-10-CM | POA: Diagnosis not present

## 2019-08-21 DIAGNOSIS — Z8774 Personal history of (corrected) congenital malformations of heart and circulatory system: Secondary | ICD-10-CM

## 2019-08-21 DIAGNOSIS — I5032 Chronic diastolic (congestive) heart failure: Secondary | ICD-10-CM

## 2019-08-21 DIAGNOSIS — I484 Atypical atrial flutter: Secondary | ICD-10-CM

## 2019-08-21 DIAGNOSIS — Z950 Presence of cardiac pacemaker: Secondary | ICD-10-CM

## 2019-08-21 DIAGNOSIS — Q221 Congenital pulmonary valve stenosis: Secondary | ICD-10-CM

## 2019-08-21 DIAGNOSIS — Z79899 Other long term (current) drug therapy: Secondary | ICD-10-CM

## 2019-08-21 DIAGNOSIS — Z7901 Long term (current) use of anticoagulants: Secondary | ICD-10-CM

## 2019-08-21 LAB — COMPREHENSIVE METABOLIC PANEL
ALT: 11 IU/L (ref 0–32)
AST: 19 IU/L (ref 0–40)
Albumin/Globulin Ratio: 2 (ref 1.2–2.2)
Albumin: 4.5 g/dL (ref 3.6–4.6)
Alkaline Phosphatase: 76 IU/L (ref 39–117)
BUN/Creatinine Ratio: 13 (ref 12–28)
BUN: 15 mg/dL (ref 8–27)
Bilirubin Total: 0.9 mg/dL (ref 0.0–1.2)
CO2: 24 mmol/L (ref 20–29)
Calcium: 10.2 mg/dL (ref 8.7–10.3)
Chloride: 106 mmol/L (ref 96–106)
Creatinine, Ser: 1.15 mg/dL — ABNORMAL HIGH (ref 0.57–1.00)
GFR calc Af Amer: 50 mL/min/{1.73_m2} — ABNORMAL LOW (ref >59)
GFR calc non Af Amer: 44 mL/min/{1.73_m2} — ABNORMAL LOW (ref >59)
Globulin, Total: 2.3 g/dL (ref 1.5–4.5)
Glucose: 114 mg/dL — ABNORMAL HIGH (ref 65–99)
Potassium: 5.7 mmol/L — ABNORMAL HIGH (ref 3.5–5.2)
Sodium: 143 mmol/L (ref 134–144)
Total Protein: 6.8 g/dL (ref 6.0–8.5)

## 2019-08-21 LAB — LIPID PANEL
Chol/HDL Ratio: 2.6 ratio (ref 0.0–4.4)
Cholesterol, Total: 132 mg/dL (ref 100–199)
HDL: 51 mg/dL (ref >39)
LDL Chol Calc (NIH): 54 mg/dL (ref 0–99)
Triglycerides: 159 mg/dL — ABNORMAL HIGH (ref 0–149)
VLDL Cholesterol Cal: 27 mg/dL (ref 5–40)

## 2019-08-21 LAB — CBC
Hematocrit: 42.4 % (ref 34.0–46.6)
Hemoglobin: 13.7 g/dL (ref 11.1–15.9)
MCH: 31.1 pg (ref 26.6–33.0)
MCHC: 32.3 g/dL (ref 31.5–35.7)
MCV: 96 fL (ref 79–97)
Platelets: 206 10*3/uL (ref 150–450)
RBC: 4.4 x10E6/uL (ref 3.77–5.28)
RDW: 12 % (ref 11.7–15.4)
WBC: 6.9 10*3/uL (ref 3.4–10.8)

## 2019-08-21 NOTE — Patient Instructions (Signed)
Medication Instructions:  The current medical regimen is effective;  continue present plan and medications as directed. Please refer to the Current Medication list given to you today. If you need a refill on your cardiac medications before your next appointment, please call your pharmacy.  Labwork: Cmet,cbc and lipid today HERE IN OUR OFFICE AT [facility]   Take the provided lab slips with you to the lab for your blood draw.   When you have your labs (blood work) drawn today and your tests are completely normal, you will receive your results only by MyChart Message (if you have MyChart) -OR-  A paper copy in the mail.  If you have any lab test that is abnormal or we need to change your treatment, we will call you to review these results.  Follow-Up: You will need a follow up appointment in 12 months.  Please call our office 2 months in advance, June 2021 to schedule this, Aug-Sept 2021 appointment.  You may see Sanda Klein, MD or one of the following Advanced Practice Providers on your designated Care Team:  Almyra Deforest, [place]  Fabian Sharp, PA-C     At [facility], you and your health needs are our priority.  As part of our continuing mission to provide you with exceptional heart care, we have created designated Provider Care Teams.  These Care Teams include your primary Cardiologist (physician) and Advanced Practice Providers (APPs -  Physician Assistants and Nurse Practitioners) who all work together to provide you with the care you need, when you need it.  Thank you for choosing [facility] at [facility]!!

## 2019-08-21 NOTE — Progress Notes (Signed)
ROS  Patient ID: [name] Baird, female   DOB: [birth year], 85 y.o.   MRN: [id number]    Cardiology Office Note    Date:  08/22/2019   ID:  [name] Baird, DOB [birth year], MRN [id number]  PCP:  [name] Baird, Julie, NP  Cardiologist:   [name] FairMihai Yanice Maqueda, MD   Chief Complaint  Patient presents with  . Atrial Fibrillation  . Pacemaker Check  . Cardiac Valve Problem    Pulmonic stenosis    History of Present Illness:  [name] Baird is a 85 y.o. female with a history of congenital heart disease (atrial septal defect (status post patch closure), pulmonic stenosis (status post valvotomy 1985), left pulmonary artery aneurysm, complete heart block status post dual-chamber permanent pacemaker implantation ([employer], last generator change 2014) and recurrent problems with paroxysmal atrial flutter and atrial fibrillation on Xarelto (atrial flutter ablation 2014). She has history of diastolic heart failure without recent exacerbation.  She has been quite well, albeit with a sedentary lifestyle.  Her family is keeping her at home given the coronavirus pandemic.  She is here today with her daughter [name] Baird who is alert and very supportive.  She denies problems with shortness of breath, edema, chest discomfort, palpitations or syncope.  It is very rare that she requires an extra dose of diuretic.  In [name] 2019 she saw [name]. Lowanda [facility] to discuss treatment for pulmonary artery aneurysm.  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  Conservative management was recommended.  Echo performed there showed a left ventricular ejection fraction of 45-50%, severe left atrial enlargement, moderate regurgitation of all 4 cardiac valves, in addition to the dilated pulmonary artery and left pulmonary artery aneurysm.  Comprehensive pacemaker interrogation shows normal function.  Her current generator was implanted in 2016 (leads  from 2007) and has an estimated longevity of another 5.5-8 years.  Manual testing of pacing thresholds was performed today.  Lead parameters are excellent.  She has 85 % atrial pacing and 100% ventricular pacing.  She has not had any atrial fibrillation in the last year. In the last 4 years she is only had a couple of episodes of atrial fibrillation.  In April 2017 she had one episode that lasted for 6 hours.  In March 2018 the atrial fibrillation only lasted for just over 2 minutes.  She had a 7 beat run of nonsustained ventricular tachycardia in March 2020.  She had a flutter ablation performed by [name]. Johney Baird in July 2014 and is not taking any antiarrhythmics at this time, other than a low dose of beta blocker. At the time of the EP study she was noted to have conventional counterclockwise isthmus-dependent right atrial flutter but then a second type of right atrial flutter was noted briefly. It could not be reinduced for full study. She has done poorly on conventional beta-blockers (atenolol, metoprolol), but tolerates Bystolic well.   Past Medical History:  Diagnosis Date  . Anxiety   . Atrial flutter (HCC)    s/p ablation 06/29/2013 by [name] Baird  . CHF (congestive heart failure) (HCC)   . Complete heart block (HCC) 2007   s/p medtronic pacemaker implantation  . Depression   . Glaucoma   . Hypertension   . Pulmonary stenosis sp valvotomy       . Shortness of breath   . Status post patch closure of ASD     Past Surgical History:  Procedure Laterality Date  . ABLATION  06/29/2013   s/p isthmus ablation for atrial flutter by [name] Baird  . AORTIC VALVE REPAIR    . ATRIAL FLUTTER ABLATION N/A 06/29/2013   Procedure: ATRIAL FLUTTER ABLATION;  Surgeon: [name] RangeJames Allred, MD;  [facility];  Service: Cardiovascular;  Laterality: N/A;  . CARDIAC CATHETERIZATION N/A 06/27/2015   Procedure: Temporary Pacemaker;  Surgeon: [name] FairMihai [name] Strohmeyer, MD;  Location: [facility] INVASIVE CV LAB;  Service:  Cardiovascular;  Laterality: N/A;  . CARDIOVERSION N/A 03/24/2013   Procedure: CARDIOVERSION;  Surgeon: [name] FairMihai [name] Ozment, MD;  Location: [facility] ENDOSCOPY;  Service: Cardiovascular;  Laterality: N/A;  . EP IMPLANTABLE DEVICE N/A 06/27/2015   Procedure: PPM Generator Changeout;  Surgeon: [name] FairMihai [name] Wenger, MD;  Location: [facility] INVASIVE CV LAB;  Service: Cardiovascular;  Laterality: N/A;  . PACEMAKER INSERTION  2007   Medtronic Adapta ADDRL1, serial # [id number]  . pulmonary valvotomy  1985  . trigeminal sx     left side    Outpatient Medications Prior to Visit  Medication Sig Dispense Refill  . acetaminophen (TYLENOL ARTHRITIS PAIN) 650 MG CR tablet Take 1,300 mg by mouth daily as needed for pain.     . bacitracin ophthalmic ointment Place 1 application into the left eye 4 (four) times daily.  1  . benzonatate (TESSALON) 100 MG capsule Take 1 capsule (100 mg total) by mouth 2 (two) times daily. 21 capsule 0  . BYSTOLIC 10 MG tablet TAKE 1 TABLET BY MOUTH EVERY DAY 30 tablet 6  . calcium-vitamin D (OSCAL) 250-125 MG-UNIT per tablet Take 1 tablet by mouth daily.    . candesartan (ATACAND) 8 MG tablet TAKE 1 TABLET BY MOUTH EVERY DAY 90 tablet 2  . clonazePAM (KLONOPIN) 0.5 MG tablet Take 0.5 mg by mouth daily as needed.     . dorzolamide-timolol (COSOPT) 22.3-6.8 MG/ML ophthalmic solution Place 2 drops into both eyes 2 (two) times daily.    . furosemide (LASIX) 40 MG tablet TAKE 1 TABLET DAILY. [name] TAKE AN EXTRA DOSE DAILY AS NEEDED FOR WEIGHT GAIN. 180 tablet 4  . latanoprost (XALATAN) 0.005 % ophthalmic solution Place 1 drop into both eyes at bedtime.    [name] Kitchen. losartan (COZAAR) 50 MG tablet TAKE 1 TABLET DAILY (KEEP OFFICE VISIT) 90 tablet 0  . Multiple Vitamins-Minerals (MULTIVITAMIN PO) Take 1 tablet by mouth daily.     . pantoprazole (PROTONIX) 40 MG tablet TAKE 1 TABLET BY MOUTH EVERY DAY 30 tablet 11  . potassium chloride (K-DUR) 10 MEQ tablet TAKE 2 TABLETS TWICE A DAY. TAKE ADDITIONAL TABLET WITH  ADDITIONAL LASIX. 375 tablet 1  . Rivaroxaban (XARELTO) 15 MG TABS tablet Take 1 tablet (15 mg total) by mouth daily with supper. 90 tablet 0  . rosuvastatin (CRESTOR) 10 MG tablet Take 10 mg by mouth at bedtime.    [name] Kitchen. venlafaxine XR (EFFEXOR-XR) 75 MG 24 hr capsule Take 75 mg by mouth daily.    . brimonidine (ALPHAGAN) 0.2 % ophthalmic solution Place 1 drop into both eyes 2 (two) times daily.   5  . ofloxacin (OCUFLOX) 0.3 % ophthalmic solution Place 1 drop into the left eye 2 (two) times daily.   0  . VIGAMOX 0.5 % ophthalmic solution Place 1 application into the left eye 4 (four) times daily.     Facility-Administered Medications Prior to Visit  Medication Dose Route Frequency Provider Last Rate Last Dose  . triamcinolone acetonide (KENALOG) 10 MG/ML injection 10 mg  10 mg Other Once Regal,  Kirstie Peri, DPM         Allergies:   Sulfa antibiotics and Hydrocodone   Social History   Socioeconomic History  . Marital status: Widowed    Spouse name: Not on file  . Number of children: 2  . Years of education: Not on file  . Highest education level: Not on file  Occupational History  . Not on file  Social Needs  . Financial resource strain: Not on file  . Food insecurity    Worry: Not on file    Inability: Not on file  . Transportation needs    Medical: Not on file    Non-medical: Not on file  Tobacco Use  . Smoking status: Never Smoker  . Smokeless tobacco: Never Used  Substance and Sexual Activity  . Alcohol use: No  . Drug use: No  . Sexual activity: Never  Lifestyle  . Physical activity    Days per week: Not on file    Minutes per session: Not on file  . Stress: Not on file  Relationships  . Social Musician on phone: Not on file    Gets together: Not on file    Attends religious service: Not on file    Active member of club or organization: Not on file    Attends meetings of clubs or organizations: Not on file    Relationship status: Not on file  Other  Topics Concern  . Not on file  Social History Narrative  . Not on file    ROS:   Please see the history of present illness.    ROS All other systems reviewed and are negative.   PHYSICAL EXAM:   VS:  BP 131/77 (BP Location: Left Arm, Patient Position: Sitting, Cuff Size: Normal)   Pulse 76   Ht 5\' 2"  (1.575 m)   Wt 165 lb (74.8 kg)   BMI 30.18 kg/m      General: Alert, oriented x3, no distress, borderline obese.  Healthy subclavian pacemaker site Head: no evidence of trauma, PERRL, EOMI, no exophtalmos or lid lag, no myxedema, no xanthelasma; normal ears, nose and oropharynx Neck: normal jugular venous pulsations and no hepatojugular reflux; brisk carotid pulses without delay and no carotid bruits Chest: clear to auscultation, no signs of consolidation by percussion or palpation, normal fremitus, symmetrical and full respiratory excursions Cardiovascular: normal position and quality of the apical impulse, regular rhythm, normal first and paradoxically split second heart sounds, 1/6 systolic ejection murmur heard at the left upper sternal border, no diastolic murmurs, rubs or gallops Abdomen: no tenderness or distention, no masses by palpation, no abnormal pulsatility or arterial bruits, normal bowel sounds, no hepatosplenomegaly Extremities: no clubbing, cyanosis or edema; 2+ radial, ulnar and brachial pulses bilaterally; 2+ right femoral, posterior tibial and dorsalis pedis pulses; 2+ left femoral, posterior tibial and dorsalis pedis pulses; no subclavian or femoral bruits Neurological: grossly nonfocal Psych: Normal mood and affect   Wt Readings from Last 3 Encounters:  08/21/19 165 lb (74.8 kg)  08/08/18 165 lb 6.4 oz (75 kg)  04/21/18 164 lb (74.4 kg)    Studies/Labs Reviewed:   EKG:  EKG is ordered today.  It shows AV sequential pacing  Lipid Panel     Component Value Date/Time   CHOL 132 08/21/2019 1043   TRIG 159 (H) 08/21/2019 1043   HDL 51 08/21/2019 1043    CHOLHDL 2.6 08/21/2019 1043   LDLCALC 49 11/03/2017 0908   BMET  Component Value Date/Time   NA 143 08/21/2019 1043   K 5.7 (H) 08/21/2019 1043   K 4.2 09/13/2013 1609   CL 106 08/21/2019 1043   CO2 24 08/21/2019 1043   GLUCOSE 114 (H) 08/21/2019 1043   GLUCOSE 112 (H) 04/14/2018 2057   BUN 15 08/21/2019 1043   CREATININE 1.15 (H) 08/21/2019 1043   CREATININE 1.03 06/25/2015 1012   CALCIUM 10.2 08/21/2019 1043   GFRNONAA 44 (L) 08/21/2019 1043   GFRAA 50 (L) 08/21/2019 1043   ASSESSMENT:    1. Chronic diastolic heart failure (HCC)   2. CHB (complete heart block) (HCC)   3. Atypical atrial flutter (HCC)   4. Pacemaker   5. Mixed hyperlipidemia   6. Pulmonic stenosis, congenital   7. History of repair of congenital atrial septal defect (ASD)   8. Long term current use of anticoagulant   9. Medication management     PLAN:  In order of problems listed above:  1. CHF, diastolic: She also has borderline reduction in left ventricular systolic function but this is probably RV pacing related. NYHA class I. Euvolemic.  Not requiring adjustment in diuretics. 2. CHB: She is pacemaker dependent.  She does not have an escape rhythm and tolerates threshold testing poorly.  Excellent lead thresholds today. 3. PPM: Continue remote downloads every 3 months and yearly office visit 4. PAFlutter/ Afib: Extremely low burden of atrial fibrillation.  She is on appropriate anticoagulation. CHADSVasc4 (age 35, gender, HF). 5. HLP: Borderline triglycerides, otherwise excellent lipid profile (labs checked today). 6. PS s/p surgical valvotomy: Mild to moderate regurgitation, no residual stenosis by echocardiogram 7. ASD s/p patch closure: No signs of residual shunt on echo, no evidence of right heart failure. 8. L PA aneurysm: 6.4 cm.  Plan conservative, non-surgical management.  Serial measurements did not appear to be indicated 9. Xarelto: No bleeding complications.     Medication  Adjustments/Labs and Tests Ordered: Current medicines are reviewed at length with the patient today.  Concerns regarding medicines are outlined above.  Medication changes, Labs and Tests ordered today are listed in the Patient Instructions below. Patient Instructions  Medication Instructions:  The current medical regimen is effective;  continue present plan and medications as directed. Please refer to the Current Medication list given to you today. If you need a refill on your cardiac medications before your next appointment, please call your pharmacy.  Labwork: Cmet,cbc and lipid today HERE IN OUR OFFICE AT [facility]   Take the provided lab slips with you to the lab for your blood draw.   When you have your labs (blood work) drawn today and your tests are completely normal, you will receive your results only by MyChart Message (if you have MyChart) -OR-  A paper copy in the mail.  If you have any lab test that is abnormal or we need to change your treatment, we will call you to review these results.  Follow-Up: You will need a follow up appointment in 12 months.  Please call our office 2 months in advance, June 2021 to schedule this, Aug-Sept 2021 appointment.  You [name] see [name] Fair, MD or one of the following Advanced Practice Providers on your designated Care Team:  Azalee Course, [place]  Micah Flesher, PA-C     At [facility], you and your health needs are our priority.  As part of our continuing mission to provide you with exceptional heart care, we have created designated Provider Care Teams.  These Care Teams  include your primary Cardiologist (physician) and Advanced Practice Providers (APPs -  Physician Assistants and Nurse Practitioners) who all work together to provide you with the care you need, when you need it.  Thank you for choosing [facility] at [facility]!!       Signed, Sanda Klein, MD  08/22/2019 9:36 AM    [facility] [address], [place],  [place]  [address] Phone: [phone]; Fax: [phone]

## 2019-08-22 ENCOUNTER — Telehealth: Payer: Self-pay | Admitting: *Deleted

## 2019-08-22 ENCOUNTER — Encounter: Payer: Self-pay | Admitting: Cardiovascular Disease

## 2019-08-22 MED ORDER — POTASSIUM CHLORIDE CRYS ER 10 MEQ PO TBCR: 10.0000 meq | EXTENDED_RELEASE_TABLET | Freq: Every day | ORAL | 1 refills | Status: DC

## 2019-08-22 NOTE — Telephone Encounter (Signed)
The patient's daughter has been made aware to hold the patient's potassium for 3 days and then decrease it to one tablet daily.

## 2019-08-22 NOTE — Telephone Encounter (Signed)
Left a message for the patient to call back.  

## 2019-08-22 NOTE — Telephone Encounter (Signed)
-----   Message from Sanda Klein, MD sent at 08/21/2019  9:40 PM EDT ----- Most labs great. K is high. Please stop the potassium supplement completely for 3 days, then restart only ONE 10 mEq daily, please.  lipids good.

## 2019-08-30 ENCOUNTER — Ambulatory Visit (INDEPENDENT_AMBULATORY_CARE_PROVIDER_SITE_OTHER): Payer: Medicare Other | Admitting: *Deleted

## 2019-08-30 DIAGNOSIS — I5032 Chronic diastolic (congestive) heart failure: Secondary | ICD-10-CM

## 2019-08-30 DIAGNOSIS — I480 Paroxysmal atrial fibrillation: Secondary | ICD-10-CM

## 2019-08-31 LAB — CUP PACEART REMOTE DEVICE CHECK
Battery Impedance: 330 Ohm
Battery Remaining Longevity: 91 mo
Battery Voltage: 2.77 V
Brady Statistic AP VP Percent: 90 %
Brady Statistic AP VS Percent: 0 %
Brady Statistic AS VP Percent: 10 %
Brady Statistic AS VS Percent: 0 %
Date Time Interrogation Session: 20200909011848
Implantable Lead Implant Date: 20070920
Implantable Lead Implant Date: 20070920
Implantable Lead Location: 753859
Implantable Lead Location: 753860
Implantable Lead Model: 4592
Implantable Lead Model: 5092
Implantable Pulse Generator Implant Date: 20160707
Lead Channel Impedance Value: 510 Ohm
Lead Channel Impedance Value: 778 Ohm
Lead Channel Pacing Threshold Amplitude: 0.625 V
Lead Channel Pacing Threshold Amplitude: 1.25 V
Lead Channel Pacing Threshold Pulse Width: 0.4 ms
Lead Channel Pacing Threshold Pulse Width: 0.4 ms
Lead Channel Setting Pacing Amplitude: 2 V
Lead Channel Setting Pacing Amplitude: 2.5 V
Lead Channel Setting Pacing Pulse Width: 0.4 ms
Lead Channel Setting Sensing Sensitivity: 5.6 mV

## 2019-09-07 ENCOUNTER — Other Ambulatory Visit: Payer: Self-pay

## 2019-09-12 ENCOUNTER — Other Ambulatory Visit: Payer: Self-pay | Admitting: Cardiovascular Disease

## 2019-09-13 NOTE — Progress Notes (Signed)
Remote pacemaker transmission.   

## 2019-09-25 ENCOUNTER — Other Ambulatory Visit: Payer: Self-pay | Admitting: Cardiovascular Disease

## 2019-09-25 ENCOUNTER — Telehealth: Payer: Self-pay | Admitting: *Deleted

## 2019-09-25 NOTE — Telephone Encounter (Signed)
Prior Authorization for Xarelto has been filed. Key code: [id number]  PA has been approved through 09/24/2020

## 2019-10-01 ENCOUNTER — Other Ambulatory Visit: Payer: Self-pay | Admitting: Cardiovascular Disease

## 2019-10-16 ENCOUNTER — Telehealth: Payer: Self-pay | Admitting: Cardiovascular Disease

## 2019-10-16 NOTE — Telephone Encounter (Signed)
New message   Patient's daughter states that she needs preauthorization for this medication :   BYSTOLIC 10 MG tablet   Please advise.

## 2019-10-16 NOTE — Telephone Encounter (Signed)
Left a message for the patient's daughter to call back.  

## 2019-10-16 NOTE — Telephone Encounter (Signed)
Follow Up  Patient's daughter returning call. Please give her a call back.

## 2019-10-16 NOTE — Telephone Encounter (Signed)
° ° °  Routed to NL Triage °

## 2019-10-17 NOTE — Telephone Encounter (Signed)
The daughter has been made aware that we will start the PA for the Bystolic and keep her posted.   She has also been made aware that the PA for Xarelto has been approved through 09/2020

## 2019-10-17 NOTE — Telephone Encounter (Signed)
Prior Josem Kaufmann has been started for Bystolic 10 mg once daily through CoverMyeds  Key: [id number]

## 2019-10-24 NOTE — Telephone Encounter (Signed)
The prior authorization was denied because the patient has not tried:  Bisoprolol 5 mg once daily Carvedilol 25 mg bid Metoprolol Succinate 25 mg once daily.  The medication is now part of a step therapy program and requires that the patient try one of the above first.   Per Dr. Sallyanne Kuster, the patient may try the Bisoprolol 5 mg daily.   The patient's daughter has been made aware. She would like to try for assistance for the Bystolic first. She will fill out her portion and fax them to the office.

## 2019-11-17 ENCOUNTER — Other Ambulatory Visit: Payer: Self-pay | Admitting: Cardiovascular Disease

## 2019-11-29 ENCOUNTER — Ambulatory Visit (INDEPENDENT_AMBULATORY_CARE_PROVIDER_SITE_OTHER): Payer: Medicare Other | Admitting: *Deleted

## 2019-11-29 DIAGNOSIS — I442 Atrioventricular block, complete: Secondary | ICD-10-CM | POA: Diagnosis not present

## 2019-11-29 LAB — CUP PACEART REMOTE DEVICE CHECK
Battery Impedance: 403 Ohm
Battery Remaining Longevity: 82 mo
Battery Voltage: 2.78 V
Brady Statistic AP VP Percent: 92 %
Brady Statistic AP VS Percent: 0 %
Brady Statistic AS VP Percent: 8 %
Brady Statistic AS VS Percent: 0 %
Date Time Interrogation Session: 20201209065608
Implantable Lead Implant Date: 20070920
Implantable Lead Implant Date: 20070920
Implantable Lead Location: 753859
Implantable Lead Location: 753860
Implantable Lead Model: 4592
Implantable Lead Model: 5092
Implantable Pulse Generator Implant Date: 20160707
Lead Channel Impedance Value: 488 Ohm
Lead Channel Impedance Value: 751 Ohm
Lead Channel Pacing Threshold Amplitude: 0.625 V
Lead Channel Pacing Threshold Amplitude: 1.25 V
Lead Channel Pacing Threshold Pulse Width: 0.4 ms
Lead Channel Pacing Threshold Pulse Width: 0.4 ms
Lead Channel Setting Pacing Amplitude: 2 V
Lead Channel Setting Pacing Amplitude: 2.5 V
Lead Channel Setting Pacing Pulse Width: 0.46 ms
Lead Channel Setting Sensing Sensitivity: 5.6 mV

## 2019-12-25 ENCOUNTER — Telehealth: Payer: Self-pay | Admitting: Cardiovascular Disease

## 2019-12-25 NOTE — Telephone Encounter (Signed)
New Message  Patient's daughter is calling in to check with Dr. Salena Saner to make sure that it is ok for the patient to get the covid-19 vaccine when she is able. Please give patient's daughter a call back to discuss.

## 2019-12-25 NOTE — Telephone Encounter (Signed)
Returned call to patient's daughter [place].Dr.Croitoru's advice given.

## 2019-12-25 NOTE — Telephone Encounter (Signed)
Definitely OK to receive it and I recommend that she does. So far, no news on how and when that will be distributed.

## 2019-12-25 NOTE — Telephone Encounter (Signed)
Returned call to patient's daughter Lanice Schwab calling to ask Dr.Croitoru if ok for mother to receive covid vaccine when it becomes available.Advised I will send message to Dr.Croitoru.

## 2020-01-01 ENCOUNTER — Other Ambulatory Visit: Payer: Self-pay

## 2020-01-01 MED ORDER — PANTOPRAZOLE SODIUM 40 MG PO TBEC: 40.0000 mg | DELAYED_RELEASE_TABLET | Freq: Every day | ORAL | 6 refills | Status: DC

## 2020-01-06 NOTE — Progress Notes (Signed)
PPM remote 

## 2020-01-12 ENCOUNTER — Other Ambulatory Visit: Payer: Self-pay | Admitting: Cardiovascular Disease

## 2020-01-12 NOTE — Telephone Encounter (Signed)
86 F 74.8 kg, SCr 1.15 (8/20), = CrCl 43.0;  LOV 8/20 Croitoru

## 2020-01-15 ENCOUNTER — Other Ambulatory Visit: Payer: Self-pay | Admitting: Cardiovascular Disease

## 2020-01-15 NOTE — Telephone Encounter (Signed)
Follow UP;       Pt's daughter said the pharmacist said they need the form they sent over for prior authorization  Pantoprazole. She needs this asap please, have not had her medicine for over a week.

## 2020-01-15 NOTE — Telephone Encounter (Signed)
Entered PA #[id number] Daughter notified. She will await PA

## 2020-01-17 MED ORDER — OMEPRAZOLE 40 MG PO CPDR: 40.0000 mg | DELAYED_RELEASE_CAPSULE | Freq: Every day | ORAL | 3 refills | Status: DC

## 2020-01-17 NOTE — Telephone Encounter (Signed)
Omeprazole 40 or esomeprazole 40

## 2020-01-17 NOTE — Telephone Encounter (Signed)
Prior auth for Pantoprazole has been denied. Insurance will not cover this medication due to the availability of other medications.

## 2020-01-17 NOTE — Telephone Encounter (Signed)
Omeprazole 40 mg once daily has been sent in for the patient. The daughter has verbalized her understanding. She will see if it is cheaper over the counter.

## 2020-01-18 ENCOUNTER — Other Ambulatory Visit: Payer: Self-pay

## 2020-01-22 NOTE — Telephone Encounter (Signed)
Patient's daughter is calling stating the pharmacy is reaching out stating they still need pre auth for the medication due to it not being filled out correctly. Please advise.

## 2020-01-22 NOTE — Telephone Encounter (Signed)
Pt daughter calling stating that the pharmacy is stating that the Prior Auth filled out for omeprazole is still not fillef out correctly. She is not able to pick it up. She also stated that she is unable to get Xarelto as well. Advised will send to primary and primary nurse for review.

## 2020-01-23 MED ORDER — OMEPRAZOLE 40 MG PO CPDR: 40.0000 mg | DELAYED_RELEASE_CAPSULE | Freq: Every day | ORAL | 3 refills | Status: DC

## 2020-01-23 MED ORDER — NEBIVOLOL HCL 10 MG PO TABS: 10.0000 mg | ORAL_TABLET | Freq: Every day | ORAL | 6 refills | Status: DC

## 2020-01-23 NOTE — Addendum Note (Signed)
Addended by: Sandi Mariscal on: 01/23/2020 02:25 PM   Modules accepted: Orders

## 2020-01-23 NOTE — Telephone Encounter (Signed)
The patient's daughter stated that she went to pick up the new medication, omeprazole, and it wasn't ready yet. Call placed to [facility]. They stated that they were waiting to hear about the prior auth for the Protonix. They have been made aware that the medication was denied and that the omeprazole would be used for a replacement. The prescrption has already been sent in. [facility] asked that it be resent because they had not received it. New prescription has been resent.   The daughter did not have any concerns with the Xarelto. PA was approved through 10/21.

## 2020-02-23 ENCOUNTER — Other Ambulatory Visit: Payer: Self-pay | Admitting: Cardiovascular Disease

## 2020-02-28 ENCOUNTER — Ambulatory Visit (INDEPENDENT_AMBULATORY_CARE_PROVIDER_SITE_OTHER): Payer: Medicare Other | Admitting: *Deleted

## 2020-02-28 DIAGNOSIS — I442 Atrioventricular block, complete: Secondary | ICD-10-CM | POA: Diagnosis not present

## 2020-02-28 LAB — CUP PACEART REMOTE DEVICE CHECK
Battery Impedance: 453 Ohm
Battery Remaining Longevity: 75 mo
Battery Voltage: 2.77 V
Brady Statistic AP VP Percent: 90 %
Brady Statistic AP VS Percent: 0 %
Brady Statistic AS VP Percent: 10 %
Brady Statistic AS VS Percent: 0 %
Date Time Interrogation Session: 20210310070609
Implantable Lead Implant Date: 20070920
Implantable Lead Implant Date: 20070920
Implantable Lead Location: 753859
Implantable Lead Location: 753860
Implantable Lead Model: 4592
Implantable Lead Model: 5092
Implantable Pulse Generator Implant Date: 20160707
Lead Channel Impedance Value: 474 Ohm
Lead Channel Impedance Value: 802 Ohm
Lead Channel Pacing Threshold Amplitude: 0.625 V
Lead Channel Pacing Threshold Amplitude: 1.375 V
Lead Channel Pacing Threshold Pulse Width: 0.4 ms
Lead Channel Pacing Threshold Pulse Width: 0.4 ms
Lead Channel Setting Pacing Amplitude: 2 V
Lead Channel Setting Pacing Amplitude: 2.75 V
Lead Channel Setting Pacing Pulse Width: 0.4 ms
Lead Channel Setting Sensing Sensitivity: 5.6 mV

## 2020-02-28 NOTE — Progress Notes (Signed)
PPM Remote  

## 2020-03-06 ENCOUNTER — Encounter (HOSPITAL_COMMUNITY): Payer: Self-pay | Admitting: Emergency Medicine

## 2020-03-06 ENCOUNTER — Inpatient Hospital Stay (HOSPITAL_COMMUNITY)
Admission: EM | Admit: 2020-03-06 | Discharge: 2020-03-08 | DRG: 378 | Disposition: A | Discharge status: Home / Self Care | Payer: Medicare Other | Attending: Student in an Organized Health Care Education/Training Program | Admitting: Student in an Organized Health Care Education/Training Program

## 2020-03-06 ENCOUNTER — Other Ambulatory Visit: Payer: Self-pay

## 2020-03-06 DIAGNOSIS — F418 Other specified anxiety disorders: Secondary | ICD-10-CM | POA: Diagnosis present

## 2020-03-06 DIAGNOSIS — Z79899 Other long term (current) drug therapy: Secondary | ICD-10-CM | POA: Diagnosis not present

## 2020-03-06 DIAGNOSIS — Z950 Presence of cardiac pacemaker: Secondary | ICD-10-CM | POA: Diagnosis not present

## 2020-03-06 DIAGNOSIS — I4891 Unspecified atrial fibrillation: Secondary | ICD-10-CM | POA: Diagnosis not present

## 2020-03-06 DIAGNOSIS — N1831 Chronic kidney disease, stage 3a: Secondary | ICD-10-CM | POA: Diagnosis present

## 2020-03-06 DIAGNOSIS — K222 Esophageal obstruction: Secondary | ICD-10-CM | POA: Diagnosis present

## 2020-03-06 DIAGNOSIS — Z20822 Contact with and (suspected) exposure to covid-19: Secondary | ICD-10-CM | POA: Diagnosis present

## 2020-03-06 DIAGNOSIS — K219 Gastro-esophageal reflux disease without esophagitis: Secondary | ICD-10-CM | POA: Diagnosis present

## 2020-03-06 DIAGNOSIS — I130 Hypertensive heart and chronic kidney disease with heart failure and stage 1 through stage 4 chronic kidney disease, or unspecified chronic kidney disease: Secondary | ICD-10-CM | POA: Diagnosis present

## 2020-03-06 DIAGNOSIS — D509 Iron deficiency anemia, unspecified: Secondary | ICD-10-CM | POA: Diagnosis present

## 2020-03-06 DIAGNOSIS — I5032 Chronic diastolic (congestive) heart failure: Secondary | ICD-10-CM | POA: Diagnosis present

## 2020-03-06 DIAGNOSIS — K449 Diaphragmatic hernia without obstruction or gangrene: Secondary | ICD-10-CM | POA: Diagnosis present

## 2020-03-06 DIAGNOSIS — Z7901 Long term (current) use of anticoagulants: Secondary | ICD-10-CM

## 2020-03-06 DIAGNOSIS — K5731 Diverticulosis of large intestine without perforation or abscess with bleeding: Secondary | ICD-10-CM | POA: Diagnosis present

## 2020-03-06 DIAGNOSIS — I480 Paroxysmal atrial fibrillation: Secondary | ICD-10-CM | POA: Diagnosis present

## 2020-03-06 DIAGNOSIS — Z882 Allergy status to sulfonamides status: Secondary | ICD-10-CM

## 2020-03-06 DIAGNOSIS — D62 Acute posthemorrhagic anemia: Secondary | ICD-10-CM | POA: Diagnosis present

## 2020-03-06 DIAGNOSIS — F0390 Unspecified dementia without behavioral disturbance: Secondary | ICD-10-CM | POA: Diagnosis present

## 2020-03-06 DIAGNOSIS — Z8775 Personal history of (corrected) congenital malformations of respiratory system: Secondary | ICD-10-CM | POA: Diagnosis not present

## 2020-03-06 DIAGNOSIS — D649 Anemia, unspecified: Secondary | ICD-10-CM

## 2020-03-06 DIAGNOSIS — H409 Unspecified glaucoma: Secondary | ICD-10-CM | POA: Diagnosis present

## 2020-03-06 DIAGNOSIS — I5042 Chronic combined systolic (congestive) and diastolic (congestive) heart failure: Secondary | ICD-10-CM | POA: Diagnosis present

## 2020-03-06 DIAGNOSIS — I442 Atrioventricular block, complete: Secondary | ICD-10-CM | POA: Diagnosis not present

## 2020-03-06 DIAGNOSIS — K922 Gastrointestinal hemorrhage, unspecified: Secondary | ICD-10-CM | POA: Diagnosis present

## 2020-03-06 DIAGNOSIS — Z8774 Personal history of (corrected) congenital malformations of heart and circulatory system: Secondary | ICD-10-CM | POA: Diagnosis not present

## 2020-03-06 DIAGNOSIS — I281 Aneurysm of pulmonary artery: Secondary | ICD-10-CM | POA: Diagnosis present

## 2020-03-06 DIAGNOSIS — Z95810 Presence of automatic (implantable) cardiac defibrillator: Secondary | ICD-10-CM

## 2020-03-06 DIAGNOSIS — I4892 Unspecified atrial flutter: Secondary | ICD-10-CM | POA: Diagnosis present

## 2020-03-06 DIAGNOSIS — K644 Residual hemorrhoidal skin tags: Secondary | ICD-10-CM | POA: Diagnosis not present

## 2020-03-06 DIAGNOSIS — Z885 Allergy status to narcotic agent status: Secondary | ICD-10-CM | POA: Diagnosis not present

## 2020-03-06 DIAGNOSIS — K648 Other hemorrhoids: Secondary | ICD-10-CM | POA: Diagnosis present

## 2020-03-06 DIAGNOSIS — I5020 Unspecified systolic (congestive) heart failure: Secondary | ICD-10-CM | POA: Diagnosis not present

## 2020-03-06 DIAGNOSIS — Z9889 Other specified postprocedural states: Secondary | ICD-10-CM | POA: Diagnosis not present

## 2020-03-06 HISTORY — DX: Gastrointestinal hemorrhage, unspecified: K92.2

## 2020-03-06 LAB — CBC
HCT: 22.8 % — ABNORMAL LOW (ref 36.0–46.0)
Hemoglobin: 7 g/dL — ABNORMAL LOW (ref 12.0–15.0)
MCH: 30.7 pg (ref 26.0–34.0)
MCHC: 30.7 g/dL (ref 30.0–36.0)
MCV: 100 fL (ref 80.0–100.0)
Platelets: 252 10*3/uL (ref 150–400)
RBC: 2.28 MIL/uL — ABNORMAL LOW (ref 3.87–5.11)
RDW: 13.2 % (ref 11.5–15.5)
WBC: 8.2 10*3/uL (ref 4.0–10.5)
nRBC: 0 % (ref 0.0–0.2)

## 2020-03-06 LAB — COMPREHENSIVE METABOLIC PANEL
ALT: 11 U/L (ref 0–44)
AST: 18 U/L (ref 15–41)
Albumin: 3.4 g/dL — ABNORMAL LOW (ref 3.5–5.0)
Alkaline Phosphatase: 53 U/L (ref 38–126)
Anion gap: 12 (ref 5–15)
BUN: 27 mg/dL — ABNORMAL HIGH (ref 8–23)
CO2: 20 mmol/L — ABNORMAL LOW (ref 22–32)
Calcium: 8.8 mg/dL — ABNORMAL LOW (ref 8.9–10.3)
Chloride: 105 mmol/L (ref 98–111)
Creatinine, Ser: 1.38 mg/dL — ABNORMAL HIGH (ref 0.44–1.00)
GFR calc Af Amer: 41 mL/min — ABNORMAL LOW (ref >60)
GFR calc non Af Amer: 35 mL/min — ABNORMAL LOW (ref >60)
Glucose, Bld: 122 mg/dL — ABNORMAL HIGH (ref 70–99)
Potassium: 4 mmol/L (ref 3.5–5.1)
Sodium: 137 mmol/L (ref 135–145)
Total Bilirubin: 0.5 mg/dL (ref 0.3–1.2)
Total Protein: 5.8 g/dL — ABNORMAL LOW (ref 6.5–8.1)

## 2020-03-06 LAB — PREPARE RBC (CROSSMATCH)

## 2020-03-06 LAB — RESPIRATORY PANEL BY RT PCR (FLU A&B, COVID)
Influenza A by PCR: NEGATIVE
Influenza B by PCR: NEGATIVE
SARS Coronavirus 2 by RT PCR: NEGATIVE

## 2020-03-06 LAB — I-STAT BETA HCG BLOOD, ED (MC, WL, AP ONLY): I-stat hCG, quantitative: 6.7 m[IU]/mL — ABNORMAL HIGH (ref <5)

## 2020-03-06 MED ORDER — PANTOPRAZOLE SODIUM 40 MG IV SOLR
40.0000 mg | Freq: Once | INTRAVENOUS | Status: AC
  Administered 2020-03-06: 40 mg via INTRAVENOUS
  Filled 2020-03-06: qty 40

## 2020-03-06 MED ORDER — CLONAZEPAM 0.5 MG PO TABS: 0.2500 mg | ORAL_TABLET | Freq: Every day | ORAL | Status: DC

## 2020-03-06 MED ORDER — VENLAFAXINE HCL ER 75 MG PO CP24
75.0000 mg | ORAL_CAPSULE | Freq: Every day | ORAL | Status: DC
  Administered 2020-03-06: 75 mg via ORAL
  Filled 2020-03-06 (×3): qty 1

## 2020-03-06 MED ORDER — DICLOFENAC SODIUM 1 % EX GEL
4.0000 g | Freq: Four times a day (QID) | CUTANEOUS | Status: DC | PRN
  Filled 2020-03-06: qty 100

## 2020-03-06 MED ORDER — NEBIVOLOL HCL 10 MG PO TABS
10.0000 mg | ORAL_TABLET | Freq: Every day | ORAL | Status: DC
  Administered 2020-03-07 – 2020-03-08 (×2): 10 mg via ORAL
  Filled 2020-03-06 (×2): qty 1

## 2020-03-06 MED ORDER — CLONAZEPAM 0.125 MG PO TBDP
0.2500 mg | ORAL_TABLET | Freq: Every day | ORAL | Status: DC
  Administered 2020-03-06 – 2020-03-07 (×2): 0.25 mg via ORAL
  Filled 2020-03-06 (×2): qty 2

## 2020-03-06 MED ORDER — SODIUM CHLORIDE 0.9% IV SOLUTION: Freq: Once | INTRAVENOUS | Status: DC

## 2020-03-06 MED ORDER — PANTOPRAZOLE SODIUM 40 MG IV SOLR
40.0000 mg | Freq: Two times a day (BID) | INTRAVENOUS | Status: DC
  Administered 2020-03-06 – 2020-03-08 (×4): 40 mg via INTRAVENOUS
  Filled 2020-03-06 (×4): qty 40

## 2020-03-06 NOTE — H&P (Addendum)
Date: 03/06/2020               Patient Name:  [name] Baird MRN: [id number]  DOB: Aug 15, 1933 Age / Sex: 86 y.o., female   PCP: Marletta Lor, NP         Medical Service: Internal Medicine Teaching Service         Attending Physician: Dr. Oswaldo Done, Marquita Palms, *    First Contact: Marchia Bond DO, Sharlet Salina Pager: Royann Shivers [phone])  Second Contact: Dortha Schwalbe, MD, Obed Pager: OA [phone])       After Hours (After 5p/  First Contact Pager: [phone]  weekends / holidays): Second Contact Pager: [phone]   Chief Complaint: GI bleed, Dyspnea on exertion  History of Present Illness: 86 y.o. yo female w/ PMH significant for pulmonary stenosis s/p valvotomy, ASD s/p closure, Afib, flutter s/p ablation 2014, complete heart block s/p pacemaker, pulmonary artery aneurysm, HFmrEF, GERD .  Presents with new acute GI bleeding, no history of prior bleeding. She was switched to omeprazole 20mg  otc 1-2 months prior after her insurance would not pay for protonix any longer and felt like it wasn't working as well.   Pt started having diarrhea on Friday with around 2 episodes per day that was responsive to immodium, however Sunday a small amount of blood in the toilet bowl, no bm Monday then Tuesday and this morning pt with increased blood in toilet bowl.  She did have some associated cramping abdominal pain but denies any nausea or vomiting.  Pt also noticed she was becoming increasingly short of breath with exertion beginning Sunday, but no dizziness. Last week Tuesday patient was taking daily ibuprofen after hurting her shoulder for around 4-5 days.  Other than that no NSAID use, no recent steroid use.    Meds:  Current Facility-Administered Medications for the 03/06/20 encounter [facility] Encounter)  Medication  . triamcinolone acetonide (KENALOG) 10 MG/ML injection 10 mg   Current Meds  Medication Sig  . acetaminophen (TYLENOL ARTHRITIS PAIN) 650 MG CR tablet Take 1,300 mg by mouth daily as needed for  pain.   . calcium-vitamin D (OSCAL) 250-125 MG-UNIT per tablet Take 1 tablet by mouth daily.  . candesartan (ATACAND) 8 MG tablet TAKE 1 TABLET BY MOUTH EVERY DAY (Patient taking differently: Take 8 mg by mouth daily. )  . clonazePAM (KLONOPIN) 0.5 MG tablet Take 0.25 mg by mouth at bedtime.   . dorzolamide-timolol (COSOPT) 22.3-6.8 MG/ML ophthalmic solution Place 2 drops into both eyes 2 (two) times daily.  [facility] erythromycin ophthalmic ointment Place 1 application into the left eye at bedtime.  . furosemide (LASIX) 40 MG tablet TAKE 1 TABLET DAILY. MAY TAKE AN EXTRA DOSE DAILY AS NEEDED FOR WEIGHT GAIN. (Patient taking differently: Take 40 mg by mouth See admin instructions. 40mg  once daily. May take an additional 40mg  once daily as needed for weight gain.)  . latanoprost (XALATAN) 0.005 % ophthalmic solution Place 1 drop into both eyes at bedtime.  . Multiple Vitamins-Minerals (MULTIVITAMIN PO) Take 1 tablet by mouth daily.   . nebivolol (BYSTOLIC) 10 MG tablet Take 1 tablet (10 mg total) by mouth daily.  [name] Kitchen omeprazole (PRILOSEC) 20 MG capsule Take 20 mg by mouth daily.  . potassium chloride (K-DUR) 10 MEQ tablet Take 1 tablet (10 mEq total) by mouth daily.  . rosuvastatin (CRESTOR) 10 MG tablet Take 10 mg by mouth at bedtime.  trimethoprim (TRIMPEX) 100 MG tablet Take 100 mg by mouth 2 (two) times daily. For ten  days  . venlafaxine XR (EFFEXOR-XR) 75 MG 24 hr capsule Take 75 mg by mouth daily.  [name] 15 MG TABS tablet TAKE 1 TABLET (15 MG TOTAL) BY MOUTH DAILY WITH SUPPER. (Patient taking differently: Take 15 mg by mouth daily with supper. )     Allergies: Allergies as of 03/06/2020 - Review Complete 03/06/2020  Allergen Reaction Noted  . Sulfa antibiotics Other (See Comments) 03/24/2013  . Hydrocodone Nausea Only 03/24/2013   Past Medical History:  Diagnosis Date  . Anxiety   . Atrial flutter (HCC)    s/p ablation 06/29/2013 by Dr Johney Frame  . CHF (congestive heart failure) (HCC)   .  Complete heart block (HCC) 2007   s/p medtronic pacemaker implantation  . Depression   . Glaucoma   . Hypertension   . Pulmonary stenosis sp valvotomy       . Shortness of breath   . Status post patch closure of ASD     Family History: No family history on file.   Social History:  Social History   Tobacco Use  . Smoking status: Never Smoker  . Smokeless tobacco: Never Used  Substance Use Topics  . Alcohol use: No  . Drug use: No     Review of Systems: A complete ROS was negative except as per HPI.   Physical Exam: Blood pressure (!) 130/56, pulse 77, temperature 98.4 F (36.9 C), temperature source Oral, resp. rate 18, SpO2 100 %. Physical Exam Constitutional:      General: She is not in acute distress.    Appearance: She is not diaphoretic.  HENT:     Head: Normocephalic and atraumatic.  Cardiovascular:     Rate and Rhythm: Normal rate and regular rhythm.     Heart sounds: Murmur present. Systolic murmur present with a grade of 3/6. No friction rub. No gallop.   Pulmonary:     Effort: Pulmonary effort is normal. No respiratory distress.     Breath sounds: Normal breath sounds. No wheezing or rales.  Chest:     Chest wall: No tenderness.  Abdominal:     General: Bowel sounds are normal. There is no distension.     Palpations: Abdomen is soft. There is no mass.     Tenderness: There is no abdominal tenderness. There is no guarding or rebound.  Neurological:     Mental Status: She is alert.  Psychiatric:        Mood and Affect: Mood normal.        Behavior: Behavior normal.     EKG: personally reviewed my interpretation is AV pacing with escape beat  CXR: personally reviewed my interpretation is none available  Assessment & Plan by Problem: Active Problems:   Acute GI bleeding  Acute GI bleeding: given history of GERD with change in PPI and recent NSAID use favor upper GI origin but not completely clear. She is receiving PRBC's and is started on PPI.   -continue PPI -check post transfusion hgb -holding xarelto, consider switch to eliquis on discharge -GI following appreciate assistance, EGD tomorrow -avoid any NSAIDs going forward  HFmrEF: euvolemic on exam  -hold lasix with acute blood loss, poor oral intake   Aflutter/afib: s/p ablation, she also has complete av block and is dual paced, she was on xarelto for [facility] at home  -continue nebivolol  -holding xarelto, consider switch to eliquis on discharge   Dispo: Admit patient to Inpatient with expected length of stay greater than 2 midnights.  Signed:  Katherine Roan, MD 03/06/2020, 5:22 PM

## 2020-03-06 NOTE — ED Triage Notes (Signed)
Pt to triage via [facility] from home.  Reports dark red blood in stool x 2 days.  Denies nausea, vomiting, diarrhea, or pain.

## 2020-03-06 NOTE — H&P (View-Only) (Signed)
[facility] Consultation Note  Referring Provider:  Internal Medicine Teaching Service Primary Care Physician:  Marletta Lor, NP  Reason for Consultation:  GI Bleeding  HPI: [name] Baird is a 86 y.o. female presenting with GI bleeding.  Starting few days ago, she had some loose stools with no blood, followed then by some black stools with admixed blood.  Started loperamide which helped diarrhea but had progressive weakness and fatigue.  Is on rivaroxaban for atrial flutter, last dose yesterday evening.  No prior EGD or Colonoscopy.  No prior history of GI bleeding.   Past Medical History:  Diagnosis Date  . Anxiety   . Atrial flutter (HCC)    s/p ablation 06/29/2013 by Dr Johney Frame  . CHF (congestive heart failure) (HCC)   . Complete heart block (HCC) 2007   s/p medtronic pacemaker implantation  . Depression   . Glaucoma   . Hypertension   . Pulmonary stenosis sp valvotomy       . Shortness of breath   . Status post patch closure of ASD     Past Surgical History:  Procedure Laterality Date  . ABLATION  06/29/2013   s/p isthmus ablation for atrial flutter by Dr Johney Frame  . AORTIC VALVE REPAIR    . ATRIAL FLUTTER ABLATION N/A 06/29/2013   Procedure: ATRIAL FLUTTER ABLATION;  Surgeon: Hillis Range, MD;  Location: [facility] CATH LAB;  Service: Cardiovascular;  Laterality: N/A;  . CARDIAC CATHETERIZATION N/A 06/27/2015   Procedure: Temporary Pacemaker;  Surgeon: Thurmon Fair, MD;  Location: [facility] INVASIVE CV LAB;  Service: Cardiovascular;  Laterality: N/A;  . CARDIOVERSION N/A 03/24/2013   Procedure: CARDIOVERSION;  Surgeon: Thurmon Fair, MD;  Location: [facility] ENDOSCOPY;  Service: Cardiovascular;  Laterality: N/A;  . EP IMPLANTABLE DEVICE N/A 06/27/2015   Procedure: PPM Generator Changeout;  Surgeon: Thurmon Fair, MD;  Location: [facility] INVASIVE CV LAB;  Service: Cardiovascular;  Laterality: N/A;  . PACEMAKER INSERTION  2007   Medtronic Adapta ADDRL1, serial # [id number]  . pulmonary  valvotomy  1985  . trigeminal sx     left side    Prior to Admission medications   Medication Sig Start Date End Date Taking? Authorizing Provider  acetaminophen (TYLENOL ARTHRITIS PAIN) 650 MG CR tablet Take 1,300 mg by mouth daily as needed for pain.     [provider]  bacitracin ophthalmic ointment Place 1 application into the left eye 4 (four) times daily. 05/25/16   [provider]  benzonatate (TESSALON) 100 MG capsule Take 1 capsule (100 mg total) by mouth 2 (two) times daily. 04/15/18   Curatolo, Adam, DO  calcium-vitamin D (OSCAL) 250-125 MG-UNIT per tablet Take 1 tablet by mouth daily.    [provider]  candesartan (ATACAND) 8 MG tablet TAKE 1 TABLET BY MOUTH EVERY DAY 02/26/20   Croitoru, Mihai, MD  clonazePAM (KLONOPIN) 0.5 MG tablet Take 0.5 mg by mouth daily as needed.     [provider]  dorzolamide-timolol (COSOPT) 22.3-6.8 MG/ML ophthalmic solution Place 2 drops into both eyes 2 (two) times daily.    [provider]  furosemide (LASIX) 40 MG tablet TAKE 1 TABLET DAILY. MAY TAKE AN EXTRA DOSE DAILY AS NEEDED FOR WEIGHT GAIN. 10/17/18   Croitoru, Mihai, MD  latanoprost (XALATAN) 0.005 % ophthalmic solution Place 1 drop into both eyes at bedtime.    [provider]  losartan (COZAAR) 50 MG tablet TAKE 1 TABLET DAILY (KEEP OFFICE VISIT) 04/18/18   Croitoru, Rachelle Hora, MD  Multiple Vitamins-Minerals (  MULTIVITAMIN PO) Take 1 tablet by mouth daily.     [provider]  nebivolol (BYSTOLIC) 10 MG tablet Take 1 tablet (10 mg total) by mouth daily. 01/23/20   Croitoru, Mihai, MD  omeprazole (PRILOSEC) 40 MG capsule Take 1 capsule (40 mg total) by mouth daily. 01/23/20   Croitoru, Mihai, MD  potassium chloride (K-DUR) 10 MEQ tablet Take 1 tablet (10 mEq total) by mouth daily. 08/22/19   Croitoru, Mihai, MD  rosuvastatin (CRESTOR) 10 MG tablet Take 10 mg by mouth at bedtime.    [provider]  venlafaxine XR (EFFEXOR-XR) 75 MG  24 hr capsule Take 75 mg by mouth daily. 06/20/15   [provider]  XARELTO 15 MG TABS tablet TAKE 1 TABLET (15 MG TOTAL) BY MOUTH DAILY WITH SUPPER. 01/12/20   Croitoru, Rachelle Hora, MD    Current Facility-Administered Medications  Medication Dose Route Frequency Provider Last Rate Last Admin  . 0.9 %  sodium chloride infusion (Manually program via Guardrails IV Fluids)   Intravenous Once Melene Plan, DO      . diclofenac Sodium (VOLTAREN) 1 % topical gel 4 g  4 g Topical QID PRN Angelita Ingles, MD      . pantoprazole (PROTONIX) injection 40 mg  40 mg Intravenous Once Adela Lank, Dan, DO      . pantoprazole (PROTONIX) injection 40 mg  40 mg Intravenous Q12H Angelita Ingles, MD      . triamcinolone acetonide (KENALOG) 10 MG/ML injection 10 mg  10 mg Other Once Lenn Sink, DPM       Current Outpatient Medications  Medication Sig Dispense Refill  . acetaminophen (TYLENOL ARTHRITIS PAIN) 650 MG CR tablet Take 1,300 mg by mouth daily as needed for pain.     . bacitracin ophthalmic ointment Place 1 application into the left eye 4 (four) times daily.  1  . benzonatate (TESSALON) 100 MG capsule Take 1 capsule (100 mg total) by mouth 2 (two) times daily. 21 capsule 0  . calcium-vitamin D (OSCAL) 250-125 MG-UNIT per tablet Take 1 tablet by mouth daily.    . candesartan (ATACAND) 8 MG tablet TAKE 1 TABLET BY MOUTH EVERY DAY 30 tablet 6  . clonazePAM (KLONOPIN) 0.5 MG tablet Take 0.5 mg by mouth daily as needed.     . dorzolamide-timolol (COSOPT) 22.3-6.8 MG/ML ophthalmic solution Place 2 drops into both eyes 2 (two) times daily.    . furosemide (LASIX) 40 MG tablet TAKE 1 TABLET DAILY. MAY TAKE AN EXTRA DOSE DAILY AS NEEDED FOR WEIGHT GAIN. 180 tablet 4  . latanoprost (XALATAN) 0.005 % ophthalmic solution Place 1 drop into both eyes at bedtime.    [name] Kitchen losartan (COZAAR) 50 MG tablet TAKE 1 TABLET DAILY (KEEP OFFICE VISIT) 90 tablet 0  . Multiple Vitamins-Minerals (MULTIVITAMIN PO) Take 1 tablet by  mouth daily.     . nebivolol (BYSTOLIC) 10 MG tablet Take 1 tablet (10 mg total) by mouth daily. 30 tablet 6  . omeprazole (PRILOSEC) 40 MG capsule Take 1 capsule (40 mg total) by mouth daily. 90 capsule 3  . potassium chloride (K-DUR) 10 MEQ tablet Take 1 tablet (10 mEq total) by mouth daily. 30 tablet 1  . rosuvastatin (CRESTOR) 10 MG tablet Take 10 mg by mouth at bedtime.    [name] Kitchen venlafaxine XR (EFFEXOR-XR) 75 MG 24 hr capsule Take 75 mg by mouth daily.    [name] 15 MG TABS tablet TAKE 1 TABLET (15 MG TOTAL) BY MOUTH DAILY  WITH SUPPER. 90 tablet 1    Allergies as of 03/06/2020 - Review Complete 03/06/2020  Allergen Reaction Noted  . Sulfa antibiotics Other (See Comments) 03/24/2013  . Hydrocodone Nausea Only 03/24/2013    No family history on file.  Social History   Socioeconomic History  . Marital status: Widowed    Spouse name: Not on file  . Number of children: 2  . Years of education: Not on file  . Highest education level: Not on file  Occupational History  . Not on file  Tobacco Use  . Smoking status: Never Smoker  . Smokeless tobacco: Never Used  Substance and Sexual Activity  . Alcohol use: No  . Drug use: No  . Sexual activity: Never  Other Topics Concern  . Not on file  Social History Narrative  . Not on file   Social Determinants of Health   Financial Resource Strain:   . Difficulty of Paying Living Expenses:   Food Insecurity:   . Worried About Programme researcher, broadcasting/film/video in the Last Year:   . Barista in the Last Year:   Transportation Needs:   . Freight forwarder (Medical):   [name] Kitchen Lack of Transportation (Non-Medical):   Physical Activity:   . Days of Exercise per Week:   . Minutes of Exercise per Session:   Stress:   . Feeling of Stress :   Social Connections:   . Frequency of Communication with Friends and Family:   . Frequency of Social Gatherings with Friends and Family:   . Attends Religious Services:   . Active Member of Clubs or  Organizations:   . Attends Banker Meetings:   [name] Kitchen Marital Status:   Intimate Partner Violence:   . Fear of Current or Ex-Partner:   . Emotionally Abused:   [name] Kitchen Physically Abused:   . Sexually Abused:     Review of Systems: Some dementia; unable to obtain  Physical Exam: Vital signs in last 24 hours: Temp:  [98.4 F (36.9 C)-98.7 F (37.1 C)] 98.6 F (37 C) (03/17 1215) Pulse Rate:  [71-85] 85 (03/17 1245) Resp:  [14-45] 19 (03/17 1245) BP: (103-129)/(47-98) 103/78 (03/17 1245) SpO2:  [84 %-100 %] 84 % (03/17 1245)   General:   Alert,  Well-developed, well-nourished, pleasant and cooperative in NAD Head:  Normocephalic and atraumatic. Eyes:  Sclera clear, no icterus.   Conjunctiva pale Ears:  Normal auditory acuity. Nose:  No deformity, discharge,  or lesions. Mouth:  No deformity or lesions.  Oropharynx pale and dry Neck:  Supple; no masses or thyromegaly. Lungs:  Clear throughout to auscultation.   No wheezes, crackles, or rhonchi. No acute distress. Heart:  Regular rate and rhythm; no murmurs, clicks, rubs,  or gallops. Abdomen:  Soft, non-tender, non-distended. No masses, hepatosplenomegaly or hernias noted. Normal bowel sounds, without guarding, and without rebound.     Msk:  Symmetrical without gross deformities. Normal posture. Pulses:  Normal pulses noted. Extremities:  Without clubbing or edema. Neurologic:  Dementia; unable to recall much history Skin:  Intact without significant lesions or rashes. Psych:  Alert and cooperative. Normal mood and affect.   Lab Results: Recent Labs    03/06/20 0843  WBC 8.2  HGB 7.0*  HCT 22.8*  PLT 252   BMET Recent Labs    03/06/20 0843  NA 137  K 4.0  CL 105  CO2 20*  GLUCOSE 122*  BUN 27*  CREATININE 1.38*  CALCIUM 8.8*  LFT Recent Labs    03/06/20 0843  PROT 5.8*  ALBUMIN 3.4*  AST 18  ALT 11  ALKPHOS 53  BILITOT 0.5   PT/INR No results for input(s): LABPROT, INR in the last 72  hours.  Studies/Results: No results found.  Impression:  1.  Gi bleeding, suspected upper GI tract in setting of rivaroxaban. 2.  Atrial flutter and valvular heart disease. 3.  Chronic anticoagulation with rivaroxaban. 4.  Acute blood loss anemia.  Plan:  1.  Clear liquids today, NPO after midnight. 2.  Continue PPI. 3.  Follow CBC; transfuse as needed. 4.  Hold rivaroxaban. 5.  Please endoscopy tomorrow. 6.  [facility] will follow.   LOS: 0 days   Aysha Livecchi M  03/06/2020, 1:22 PM  Cell [phone] If no answer or after 5 PM call [phone]

## 2020-03-06 NOTE — ED Provider Notes (Signed)
[facility] EMERGENCY DEPARTMENT Provider Note   CSN: [id number] Arrival date & time: 03/06/20  0815     History Chief Complaint  Patient presents with  . Rectal Bleeding    [name] Baird is a 86 y.o. female.  86 yo F with a chief complaints of rectal bleeding.  Has noticed dark stools intermixed with blood.  Going on for about 48 hours.  Significant number of bowel movements yesterday though she is unsure of how many no BM yet this morning.  Denies abdominal pain.  Denies history of GI bleeding.  She is on a blood thinner but not sure which one.  Is been feeling weak and short of breath and lightheaded.  She denies ever seeing a GI doctor.  Denies prior endoscopy or colonoscopy.  The history is provided by the patient.  Rectal Bleeding Quality:  Maroon Amount:  Moderate Duration:  2 hours Timing:  Constant Chronicity:  New Similar prior episodes: no   Relieved by:  Nothing Worsened by:  Nothing Ineffective treatments:  None tried Associated symptoms: light-headedness   Associated symptoms: no abdominal pain, no dizziness, no fever and no vomiting   Risk factors: anticoagulant use        Past Medical History:  Diagnosis Date  . Anxiety   . Atrial flutter (HCC)    s/p ablation 06/29/2013 by Dr Johney Frame  . CHF (congestive heart failure) (HCC)   . Complete heart block (HCC) 2007   s/p medtronic pacemaker implantation  . Depression   . Glaucoma   . Hypertension   . Pulmonary stenosis sp valvotomy       . Shortness of breath   . Status post patch closure of ASD     Patient Active Problem List   Diagnosis Date Noted  . Acute GI bleeding 03/06/2020  . Hypercholesterolemia 08/08/2018  . 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 Cough 04/21/2018  . Long term current use of anticoagulant therapy 06/26/2016  . Indigestion 03/25/2016  . Chronic diastolic heart failure (HCC) 01/16/2016  . Pacemaker battery depletion 06/27/2015  .  Paroxysmal atrial fibrillation (HCC) 08/03/2013  . Atrial flutter (HCC) 06/08/2013  . Complete heart block (HCC) 06/08/2013  . Pacemaker   . Status post patch closure of ASD   . Pulmonary stenosis sp valvotomy     Past Surgical History:  Procedure Laterality Date  . ABLATION  06/29/2013   s/p isthmus ablation for atrial flutter by Dr Johney Frame  . AORTIC VALVE REPAIR    . ATRIAL FLUTTER ABLATION N/A 06/29/2013   Procedure: ATRIAL FLUTTER ABLATION;  Surgeon: Hillis Range, MD;  Location: [facility] CATH LAB;  Service: Cardiovascular;  Laterality: N/A;  . CARDIAC CATHETERIZATION N/A 06/27/2015   Procedure: Temporary Pacemaker;  Surgeon: Thurmon Fair, MD;  Location: [facility] INVASIVE CV LAB;  Service: Cardiovascular;  Laterality: N/A;  . CARDIOVERSION N/A 03/24/2013   Procedure: CARDIOVERSION;  Surgeon: Thurmon Fair, MD;  Location: [facility] ENDOSCOPY;  Service: Cardiovascular;  Laterality: N/A;  . EP IMPLANTABLE DEVICE N/A 06/27/2015   Procedure: PPM Generator Changeout;  Surgeon: Thurmon Fair, MD;  Location: [facility] INVASIVE CV LAB;  Service: Cardiovascular;  Laterality: N/A;  . PACEMAKER INSERTION  2007   Medtronic Adapta ADDRL1, serial # [id number]  . pulmonary valvotomy  1985  . trigeminal sx     left side     OB History   No obstetric history on file.     No family history on file.  Social History  Tobacco Use  . Smoking status: Never Smoker  . Smokeless tobacco: Never Used  Substance Use Topics  . Alcohol use: No  . Drug use: No    Home Medications Prior to Admission medications   Medication Sig Start Date End Date Taking? Authorizing Provider  acetaminophen (TYLENOL ARTHRITIS PAIN) 650 MG CR tablet Take 1,300 mg by mouth daily as needed for pain.    Yes [provider]  calcium-vitamin D (OSCAL) 250-125 MG-UNIT per tablet Take 1 tablet by mouth daily.   Yes [provider]  candesartan (ATACAND) 8 MG tablet TAKE 1 TABLET BY MOUTH EVERY DAY Patient taking differently: Take 8  mg by mouth daily.  02/26/20  Yes Croitoru, Mihai, MD  clonazePAM (KLONOPIN) 0.5 MG tablet Take 0.25 mg by mouth at bedtime.    Yes [provider]  dorzolamide-timolol (COSOPT) 22.3-6.8 MG/ML ophthalmic solution Place 2 drops into both eyes 2 (two) times daily.   Yes [provider]  erythromycin ophthalmic ointment Place 1 application into the left eye at bedtime.   Yes [provider]  furosemide (LASIX) 40 MG tablet TAKE 1 TABLET DAILY. MAY TAKE AN EXTRA DOSE DAILY AS NEEDED FOR WEIGHT GAIN. Patient taking differently: Take 40 mg by mouth See admin instructions. 40mg  once daily. May take an additional 40mg  once daily as needed for weight gain. 10/17/18  Yes Croitoru, Mihai, MD  latanoprost (XALATAN) 0.005 % ophthalmic solution Place 1 drop into both eyes at bedtime.   Yes [provider]  Multiple Vitamins-Minerals (MULTIVITAMIN PO) Take 1 tablet by mouth daily.    Yes [provider]  nebivolol (BYSTOLIC) 10 MG tablet Take 1 tablet (10 mg total) by mouth daily. 01/23/20  Yes Croitoru, Mihai, MD  omeprazole (PRILOSEC) 20 MG capsule Take 20 mg by mouth daily.   Yes [provider]  potassium chloride (K-DUR) 10 MEQ tablet Take 1 tablet (10 mEq total) by mouth daily. 08/22/19  Yes Croitoru, Mihai, MD  rosuvastatin (CRESTOR) 10 MG tablet Take 10 mg by mouth at bedtime.   Yes [provider]  trimethoprim (TRIMPEX) 100 MG tablet Take 100 mg by mouth 2 (two) times daily. For ten days   Yes [provider]  venlafaxine XR (EFFEXOR-XR) 75 MG 24 hr capsule Take 75 mg by mouth daily. 06/20/15  Yes [provider]  XARELTO 15 MG TABS tablet TAKE 1 TABLET (15 MG TOTAL) BY MOUTH DAILY WITH SUPPER. Patient taking differently: Take 15 mg by mouth daily with supper.  01/12/20  Yes Croitoru, Mihai, MD  benzonatate (TESSALON) 100 MG capsule Take 1 capsule (100 mg total) by mouth 2 (two) times daily. Patient not taking: Reported on 03/06/2020  04/15/18   03/08/2020, DO  losartan (COZAAR) 50 MG tablet TAKE 1 TABLET DAILY (KEEP OFFICE VISIT) Patient not taking: Reported on 03/06/2020 04/18/18   Croitoru, Mihai, MD  omeprazole (PRILOSEC) 40 MG capsule Take 1 capsule (40 mg total) by mouth daily. Patient not taking: Reported on 03/06/2020 01/23/20   Croitoru, 03/08/2020, MD    Allergies    Sulfa antibiotics and Hydrocodone  Review of Systems   Review of Systems  Constitutional: Negative for chills and fever.  HENT: Negative for congestion and rhinorrhea.   Eyes: Negative for redness and visual disturbance.  Respiratory: Negative for shortness of breath and wheezing.   Cardiovascular: Negative for chest pain and palpitations.  Gastrointestinal: Positive for blood in stool and hematochezia. Negative for abdominal pain, nausea and vomiting.  Genitourinary:  Negative for dysuria and urgency.  Musculoskeletal: Negative for arthralgias and myalgias.  Skin: Negative for pallor and wound.  Neurological: Positive for light-headedness. Negative for dizziness and headaches.    Physical Exam Updated Vital Signs BP 103/78   Pulse 85   Temp 98.6 F (37 C) (Oral)   Resp 19   SpO2 (!) 84%   Physical Exam Vitals and nursing note reviewed.  Constitutional:      General: She is not in acute distress.    Appearance: She is well-developed. She is not diaphoretic.  HENT:     Head: Normocephalic and atraumatic.  Eyes:     Pupils: Pupils are equal, round, and reactive to light.  Cardiovascular:     Rate and Rhythm: Normal rate and regular rhythm.     Heart sounds: No murmur. No friction rub. No gallop.   Pulmonary:     Effort: Pulmonary effort is normal.     Breath sounds: No wheezing or rales.  Abdominal:     General: There is no distension.     Palpations: Abdomen is soft.     Tenderness: There is no abdominal tenderness.  Musculoskeletal:        General: No tenderness.     Cervical back: Normal range of motion and neck supple.   Skin:    General: Skin is warm and dry.     Coloration: Skin is pale.  Neurological:     Mental Status: She is alert and oriented to person, place, and time.  Psychiatric:        Behavior: Behavior normal.     ED Results / Procedures / Treatments   Labs (all labs ordered are listed, but only abnormal results are displayed) Labs Reviewed  COMPREHENSIVE METABOLIC PANEL - Abnormal; Notable for the following components:      Result Value   CO2 20 (*)    Glucose, Bld 122 (*)    BUN 27 (*)    Creatinine, Ser 1.38 (*)    Calcium 8.8 (*)    Total Protein 5.8 (*)    Albumin 3.4 (*)    GFR calc non Af Amer 35 (*)    GFR calc Af Amer 41 (*)    All other components within normal limits  CBC - Abnormal; Notable for the following components:   RBC 2.28 (*)    Hemoglobin 7.0 (*)    HCT 22.8 (*)    All other components within normal limits  I-STAT BETA HCG BLOOD, ED ([facility], [facility], [facility] ONLY) - Abnormal; Notable for the following components:   I-stat hCG, quantitative 6.7 (*)    All other components within normal limits  RESPIRATORY PANEL BY RT PCR (FLU A&B, COVID)  TYPE AND SCREEN  PREPARE RBC (CROSSMATCH)    EKG EKG Interpretation  Date/Time:  Wednesday March 06 2020 10:45:41 EDT Ventricular Rate:  74 PR Interval:    QRS Duration: 177 QT Interval:  504 QTC Calculation: 560 R Axis:   -81 Text Interpretation: Atrial fibrillation Nonspecific IVCD with LAD LVH with secondary repolarization abnormality Reconfirmed by Melene Plan [phone]) on 03/06/2020 11:36:11 AM   Radiology No results found.  Procedures Procedures (including critical care time)  Medications Ordered in ED Medications  0.9 %  sodium chloride infusion (Manually program via Guardrails IV Fluids) (has no administration in time range)  diclofenac Sodium (VOLTAREN) 1 % topical gel 4 g (has no administration in time range)  pantoprazole (PROTONIX) injection 40 mg (has no administration in time range)  pantoprazole  (PROTONIX) injection 40 mg (40 mg Intravenous Given 03/06/20 1429)    ED Course  I have reviewed the triage vital signs and the nursing notes.  Pertinent labs & imaging results that were available during my care of the patient were reviewed by me and considered in my medical decision making (see chart for details).    MDM Rules/Calculators/A&P                      86 yo F with a chief complaints of melena.  Going on for about 48 hours.  Melanotic on exam.  No hemorrhoids.  Hemoglobin is 7 looks like her baseline is about 13.  We will transfuse 2 units of blood.  Start on Protonix.  Discussed with GI.  Will come and evaluate the patient at bedside.  Discuss with medicine for admission.  CRITICAL CARE Performed by: Cecilio Asper   Total critical care time: 35 minutes  Critical care time was exclusive of separately billable procedures and treating other patients.  Critical care was necessary to treat or prevent imminent or life-threatening deterioration.  Critical care was time spent personally by me on the following activities: development of treatment plan with patient and/or surrogate as well as nursing, discussions with consultants, evaluation of patient's response to treatment, examination of patient, obtaining history from patient or surrogate, ordering and performing treatments and interventions, ordering and review of laboratory studies, ordering and review of radiographic studies, pulse oximetry and re-evaluation of patient's condition.  The patients results and plan were reviewed and discussed.   Any x-rays performed were independently reviewed by myself.   Differential diagnosis were considered with the presenting HPI.  Medications  0.9 %  sodium chloride infusion (Manually program via Guardrails IV Fluids) (has no administration in time range)  diclofenac Sodium (VOLTAREN) 1 % topical gel 4 g (has no administration in time range)  pantoprazole (PROTONIX) injection 40  mg (has no administration in time range)  pantoprazole (PROTONIX) injection 40 mg (40 mg Intravenous Given 03/06/20 1429)    Vitals:   03/06/20 1155 03/06/20 1215 03/06/20 1230 03/06/20 1245  BP: 129/61 (!) 111/48 114/67 103/78  Pulse: 71 74 72 85  Resp: 18 (!) 22 (!) 25 19  Temp: 98.7 F (37.1 C) 98.6 F (37 C)    TempSrc: Oral Oral    SpO2: 100% 100% 100% (!) 84%    Final diagnoses:  Acute GI bleeding  Symptomatic anemia    Admission/ observation were discussed with the admitting physician, patient and/or family and they are comfortable with the plan.    Final Clinical Impression(s) / ED Diagnoses Final diagnoses:  Acute GI bleeding  Symptomatic anemia    Rx / DC Orders ED Discharge Orders    None       Deno Etienne, DO 03/06/20 1442

## 2020-03-06 NOTE — Consult Note (Signed)
[facility] Consultation Note  Referring Provider:  Internal Medicine Teaching Service Primary Care Physician:  Marletta Lor, NP  Reason for Consultation:  GI Bleeding  HPI: [name] Baird is a 86 y.o. female presenting with GI bleeding.  Starting few days ago, she had some loose stools with no blood, followed then by some black stools with admixed blood.  Started loperamide which helped diarrhea but had progressive weakness and fatigue.  Is on rivaroxaban for atrial flutter, last dose yesterday evening.  No prior EGD or Colonoscopy.  No prior history of GI bleeding.   Past Medical History:  Diagnosis Date  . Anxiety   . Atrial flutter (HCC)    s/p ablation 06/29/2013 by Dr Johney Frame  . CHF (congestive heart failure) (HCC)   . Complete heart block (HCC) 2007   s/p medtronic pacemaker implantation  . Depression   . Glaucoma   . Hypertension   . Pulmonary stenosis sp valvotomy       . Shortness of breath   . Status post patch closure of ASD     Past Surgical History:  Procedure Laterality Date  . ABLATION  06/29/2013   s/p isthmus ablation for atrial flutter by Dr Johney Frame  . AORTIC VALVE REPAIR    . ATRIAL FLUTTER ABLATION N/A 06/29/2013   Procedure: ATRIAL FLUTTER ABLATION;  Surgeon: Hillis Range, MD;  Location: [facility] CATH LAB;  Service: Cardiovascular;  Laterality: N/A;  . CARDIAC CATHETERIZATION N/A 06/27/2015   Procedure: Temporary Pacemaker;  Surgeon: Thurmon Fair, MD;  Location: [facility] INVASIVE CV LAB;  Service: Cardiovascular;  Laterality: N/A;  . CARDIOVERSION N/A 03/24/2013   Procedure: CARDIOVERSION;  Surgeon: Thurmon Fair, MD;  Location: [facility] ENDOSCOPY;  Service: Cardiovascular;  Laterality: N/A;  . EP IMPLANTABLE DEVICE N/A 06/27/2015   Procedure: PPM Generator Changeout;  Surgeon: Thurmon Fair, MD;  Location: [facility] INVASIVE CV LAB;  Service: Cardiovascular;  Laterality: N/A;  . PACEMAKER INSERTION  2007   Medtronic Adapta ADDRL1, serial # [id number]  . pulmonary  valvotomy  1985  . trigeminal sx     left side    Prior to Admission medications   Medication Sig Start Date End Date Taking? Authorizing Provider  acetaminophen (TYLENOL ARTHRITIS PAIN) 650 MG CR tablet Take 1,300 mg by mouth daily as needed for pain.     [provider]  bacitracin ophthalmic ointment Place 1 application into the left eye 4 (four) times daily. 05/25/16   [provider]  benzonatate (TESSALON) 100 MG capsule Take 1 capsule (100 mg total) by mouth 2 (two) times daily. 04/15/18   Curatolo, Adam, DO  calcium-vitamin D (OSCAL) 250-125 MG-UNIT per tablet Take 1 tablet by mouth daily.    [provider]  candesartan (ATACAND) 8 MG tablet TAKE 1 TABLET BY MOUTH EVERY DAY 02/26/20   Croitoru, Mihai, MD  clonazePAM (KLONOPIN) 0.5 MG tablet Take 0.5 mg by mouth daily as needed.     [provider]  dorzolamide-timolol (COSOPT) 22.3-6.8 MG/ML ophthalmic solution Place 2 drops into both eyes 2 (two) times daily.    [provider]  furosemide (LASIX) 40 MG tablet TAKE 1 TABLET DAILY. MAY TAKE AN EXTRA DOSE DAILY AS NEEDED FOR WEIGHT GAIN. 10/17/18   Croitoru, Mihai, MD  latanoprost (XALATAN) 0.005 % ophthalmic solution Place 1 drop into both eyes at bedtime.    [provider]  losartan (COZAAR) 50 MG tablet TAKE 1 TABLET DAILY (KEEP OFFICE VISIT) 04/18/18   Croitoru, Rachelle Hora, MD  Multiple Vitamins-Minerals (  MULTIVITAMIN PO) Take 1 tablet by mouth daily.     [provider]  nebivolol (BYSTOLIC) 10 MG tablet Take 1 tablet (10 mg total) by mouth daily. 01/23/20   Croitoru, Mihai, MD  omeprazole (PRILOSEC) 40 MG capsule Take 1 capsule (40 mg total) by mouth daily. 01/23/20   Croitoru, Mihai, MD  potassium chloride (K-DUR) 10 MEQ tablet Take 1 tablet (10 mEq total) by mouth daily. 08/22/19   Croitoru, Mihai, MD  rosuvastatin (CRESTOR) 10 MG tablet Take 10 mg by mouth at bedtime.    [provider]  venlafaxine XR (EFFEXOR-XR) 75 MG  24 hr capsule Take 75 mg by mouth daily. 06/20/15   [provider]  XARELTO 15 MG TABS tablet TAKE 1 TABLET (15 MG TOTAL) BY MOUTH DAILY WITH SUPPER. 01/12/20   Croitoru, Mihai, MD    Current Facility-Administered Medications  Medication Dose Route Frequency Provider Last Rate Last Admin  . 0.9 %  sodium chloride infusion (Manually program via Guardrails IV Fluids)   Intravenous Once Floyd, Dan, DO      . diclofenac Sodium (VOLTAREN) 1 % topical gel 4 g  4 g Topical QID PRN Winfrey, Jacon Whetzel B, MD      . pantoprazole (PROTONIX) injection 40 mg  40 mg Intravenous Once Floyd, Dan, DO      . pantoprazole (PROTONIX) injection 40 mg  40 mg Intravenous Q12H Winfrey, Davius Goudeau B, MD      . triamcinolone acetonide (KENALOG) 10 MG/ML injection 10 mg  10 mg Other Once Regal, Norman S, DPM       Current Outpatient Medications  Medication Sig Dispense Refill  . acetaminophen (TYLENOL ARTHRITIS PAIN) 650 MG CR tablet Take 1,300 mg by mouth daily as needed for pain.     . bacitracin ophthalmic ointment Place 1 application into the left eye 4 (four) times daily.  1  . benzonatate (TESSALON) 100 MG capsule Take 1 capsule (100 mg total) by mouth 2 (two) times daily. 21 capsule 0  . calcium-vitamin D (OSCAL) 250-125 MG-UNIT per tablet Take 1 tablet by mouth daily.    . candesartan (ATACAND) 8 MG tablet TAKE 1 TABLET BY MOUTH EVERY DAY 30 tablet 6  . clonazePAM (KLONOPIN) 0.5 MG tablet Take 0.5 mg by mouth daily as needed.     . dorzolamide-timolol (COSOPT) 22.3-6.8 MG/ML ophthalmic solution Place 2 drops into both eyes 2 (two) times daily.    . furosemide (LASIX) 40 MG tablet TAKE 1 TABLET DAILY. MAY TAKE AN EXTRA DOSE DAILY AS NEEDED FOR WEIGHT GAIN. 180 tablet 4  . latanoprost (XALATAN) 0.005 % ophthalmic solution Place 1 drop into both eyes at bedtime.    . losartan (COZAAR) 50 MG tablet TAKE 1 TABLET DAILY (KEEP OFFICE VISIT) 90 tablet 0  . Multiple Vitamins-Minerals (MULTIVITAMIN PO) Take 1 tablet by  mouth daily.     . nebivolol (BYSTOLIC) 10 MG tablet Take 1 tablet (10 mg total) by mouth daily. 30 tablet 6  . omeprazole (PRILOSEC) 40 MG capsule Take 1 capsule (40 mg total) by mouth daily. 90 capsule 3  . potassium chloride (K-DUR) 10 MEQ tablet Take 1 tablet (10 mEq total) by mouth daily. 30 tablet 1  . rosuvastatin (CRESTOR) 10 MG tablet Take 10 mg by mouth at bedtime.    . venlafaxine XR (EFFEXOR-XR) 75 MG 24 hr capsule Take 75 mg by mouth daily.    . XARELTO 15 MG TABS tablet TAKE 1 TABLET (15 MG TOTAL) BY MOUTH DAILY   WITH SUPPER. 90 tablet 1    Allergies as of 03/06/2020 - Review Complete 03/06/2020  Allergen Reaction Noted  . Sulfa antibiotics Other (See Comments) 03/24/2013  . Hydrocodone Nausea Only 03/24/2013    No family history on file.  Social History   Socioeconomic History  . Marital status: Widowed    Spouse name: Not on file  . Number of children: 2  . Years of education: Not on file  . Highest education level: Not on file  Occupational History  . Not on file  Tobacco Use  . Smoking status: Never Smoker  . Smokeless tobacco: Never Used  Substance and Sexual Activity  . Alcohol use: No  . Drug use: No  . Sexual activity: Never  Other Topics Concern  . Not on file  Social History Narrative  . Not on file   Social Determinants of Health   Financial Resource Strain:   . Difficulty of Paying Living Expenses:   Food Insecurity:   . Worried About Programme researcher, broadcasting/film/video in the Last Year:   . Barista in the Last Year:   Transportation Needs:   . Freight forwarder (Medical):   [name] Kitchen Lack of Transportation (Non-Medical):   Physical Activity:   . Days of Exercise per Week:   . Minutes of Exercise per Session:   Stress:   . Feeling of Stress :   Social Connections:   . Frequency of Communication with Friends and Family:   . Frequency of Social Gatherings with Friends and Family:   . Attends Religious Services:   . Active Member of Clubs or  Organizations:   . Attends Banker Meetings:   [name] Kitchen Marital Status:   Intimate Partner Violence:   . Fear of Current or Ex-Partner:   . Emotionally Abused:   [name] Kitchen Physically Abused:   . Sexually Abused:     Review of Systems: Some dementia; unable to obtain  Physical Exam: Vital signs in last 24 hours: Temp:  [98.4 F (36.9 C)-98.7 F (37.1 C)] 98.6 F (37 C) (03/17 1215) Pulse Rate:  [71-85] 85 (03/17 1245) Resp:  [14-45] 19 (03/17 1245) BP: (103-129)/(47-98) 103/78 (03/17 1245) SpO2:  [84 %-100 %] 84 % (03/17 1245)   General:   Alert,  Well-developed, well-nourished, pleasant and cooperative in NAD Head:  Normocephalic and atraumatic. Eyes:  Sclera clear, no icterus.   Conjunctiva pale Ears:  Normal auditory acuity. Nose:  No deformity, discharge,  or lesions. Mouth:  No deformity or lesions.  Oropharynx pale and dry Neck:  Supple; no masses or thyromegaly. Lungs:  Clear throughout to auscultation.   No wheezes, crackles, or rhonchi. No acute distress. Heart:  Regular rate and rhythm; no murmurs, clicks, rubs,  or gallops. Abdomen:  Soft, non-tender, non-distended. No masses, hepatosplenomegaly or hernias noted. Normal bowel sounds, without guarding, and without rebound.     Msk:  Symmetrical without gross deformities. Normal posture. Pulses:  Normal pulses noted. Extremities:  Without clubbing or edema. Neurologic:  Dementia; unable to recall much history Skin:  Intact without significant lesions or rashes. Psych:  Alert and cooperative. Normal mood and affect.   Lab Results: Recent Labs    03/06/20 0843  WBC 8.2  HGB 7.0*  HCT 22.8*  PLT 252   BMET Recent Labs    03/06/20 0843  NA 137  K 4.0  CL 105  CO2 20*  GLUCOSE 122*  BUN 27*  CREATININE 1.38*  CALCIUM 8.8*  LFT Recent Labs    03/06/20 0843  PROT 5.8*  ALBUMIN 3.4*  AST 18  ALT 11  ALKPHOS 53  BILITOT 0.5   PT/INR No results for input(s): LABPROT, INR in the last 72  hours.  Studies/Results: No results found.  Impression:  1.  Gi bleeding, suspected upper GI tract in setting of rivaroxaban. 2.  Atrial flutter and valvular heart disease. 3.  Chronic anticoagulation with rivaroxaban. 4.  Acute blood loss anemia.  Plan:  1.  Clear liquids today, NPO after midnight. 2.  Continue PPI. 3.  Follow CBC; transfuse as needed. 4.  Hold rivaroxaban. 5.  Please endoscopy tomorrow. 6.  [facility] will follow.   LOS: 0 days   Cendy Oconnor M  03/06/2020, 1:22 PM  Cell [phone] If no answer or after 5 PM call [phone]

## 2020-03-07 ENCOUNTER — Inpatient Hospital Stay (HOSPITAL_COMMUNITY): Payer: Medicare Other | Admitting: Certified Registered Nurse Anesthetist

## 2020-03-07 ENCOUNTER — Encounter (HOSPITAL_COMMUNITY): Payer: Self-pay | Admitting: Student in an Organized Health Care Education/Training Program

## 2020-03-07 ENCOUNTER — Encounter (HOSPITAL_COMMUNITY)
Admission: EM | Disposition: A | Discharge status: Home / Self Care | Payer: Self-pay | Source: Home / Self Care | Attending: Student in an Organized Health Care Education/Training Program

## 2020-03-07 DIAGNOSIS — K922 Gastrointestinal hemorrhage, unspecified: Secondary | ICD-10-CM

## 2020-03-07 DIAGNOSIS — Z950 Presence of cardiac pacemaker: Secondary | ICD-10-CM

## 2020-03-07 DIAGNOSIS — K219 Gastro-esophageal reflux disease without esophagitis: Secondary | ICD-10-CM

## 2020-03-07 DIAGNOSIS — I281 Aneurysm of pulmonary artery: Secondary | ICD-10-CM

## 2020-03-07 DIAGNOSIS — Z8775 Personal history of (corrected) congenital malformations of respiratory system: Secondary | ICD-10-CM

## 2020-03-07 DIAGNOSIS — Z9889 Other specified postprocedural states: Secondary | ICD-10-CM

## 2020-03-07 DIAGNOSIS — I4891 Unspecified atrial fibrillation: Secondary | ICD-10-CM

## 2020-03-07 DIAGNOSIS — I442 Atrioventricular block, complete: Secondary | ICD-10-CM

## 2020-03-07 DIAGNOSIS — Z7901 Long term (current) use of anticoagulants: Secondary | ICD-10-CM

## 2020-03-07 DIAGNOSIS — Z79899 Other long term (current) drug therapy: Secondary | ICD-10-CM

## 2020-03-07 DIAGNOSIS — I5020 Unspecified systolic (congestive) heart failure: Secondary | ICD-10-CM

## 2020-03-07 HISTORY — PX: ESOPHAGOGASTRODUODENOSCOPY (EGD) WITH PROPOFOL: SHX5813

## 2020-03-07 LAB — TYPE AND SCREEN
ABO/RH(D): A POS
Antibody Screen: NEGATIVE
Unit division: 0
Unit division: 0

## 2020-03-07 LAB — RETICULOCYTES
Immature Retic Fract: 30.6 % — ABNORMAL HIGH (ref 2.3–15.9)
RBC.: 2.79 MIL/uL — ABNORMAL LOW (ref 3.87–5.11)
Retic Count, Absolute: 83.7 10*3/uL (ref 19.0–186.0)
Retic Ct Pct: 3 % (ref 0.4–3.1)

## 2020-03-07 LAB — CBC
HCT: 25.4 % — ABNORMAL LOW (ref 36.0–46.0)
HCT: 27 % — ABNORMAL LOW (ref 36.0–46.0)
Hemoglobin: 8.4 g/dL — ABNORMAL LOW (ref 12.0–15.0)
Hemoglobin: 8.9 g/dL — ABNORMAL LOW (ref 12.0–15.0)
MCH: 30 pg (ref 26.0–34.0)
MCH: 30.1 pg (ref 26.0–34.0)
MCHC: 33 g/dL (ref 30.0–36.0)
MCHC: 33.1 g/dL (ref 30.0–36.0)
MCV: 90.9 fL (ref 80.0–100.0)
MCV: 91 fL (ref 80.0–100.0)
Platelets: 195 10*3/uL (ref 150–400)
Platelets: 237 10*3/uL (ref 150–400)
RBC: 2.79 MIL/uL — ABNORMAL LOW (ref 3.87–5.11)
RBC: 2.97 MIL/uL — ABNORMAL LOW (ref 3.87–5.11)
RDW: 15 % (ref 11.5–15.5)
RDW: 15.2 % (ref 11.5–15.5)
WBC: 6.6 10*3/uL (ref 4.0–10.5)
WBC: 8.4 10*3/uL (ref 4.0–10.5)
nRBC: 0 % (ref 0.0–0.2)
nRBC: 0 % (ref 0.0–0.2)

## 2020-03-07 LAB — COMPREHENSIVE METABOLIC PANEL
ALT: 10 U/L (ref 0–44)
AST: 17 U/L (ref 15–41)
Albumin: 3.1 g/dL — ABNORMAL LOW (ref 3.5–5.0)
Alkaline Phosphatase: 49 U/L (ref 38–126)
Anion gap: 11 (ref 5–15)
BUN: 20 mg/dL (ref 8–23)
CO2: 23 mmol/L (ref 22–32)
Calcium: 8.8 mg/dL — ABNORMAL LOW (ref 8.9–10.3)
Chloride: 107 mmol/L (ref 98–111)
Creatinine, Ser: 1.27 mg/dL — ABNORMAL HIGH (ref 0.44–1.00)
GFR calc Af Amer: 45 mL/min — ABNORMAL LOW (ref >60)
GFR calc non Af Amer: 39 mL/min — ABNORMAL LOW (ref >60)
Glucose, Bld: 103 mg/dL — ABNORMAL HIGH (ref 70–99)
Potassium: 4.3 mmol/L (ref 3.5–5.1)
Sodium: 141 mmol/L (ref 135–145)
Total Bilirubin: 1.1 mg/dL (ref 0.3–1.2)
Total Protein: 5.2 g/dL — ABNORMAL LOW (ref 6.5–8.1)

## 2020-03-07 LAB — IRON AND TIBC
Iron: 15 ug/dL — ABNORMAL LOW (ref 28–170)
Saturation Ratios: 4 % — ABNORMAL LOW (ref 10.4–31.8)
TIBC: 335 ug/dL (ref 250–450)
UIBC: 320 ug/dL

## 2020-03-07 LAB — BPAM RBC
Blood Product Expiration Date: 202104142359
Blood Product Expiration Date: 202104142359
ISSUE DATE / TIME: 202103171140
ISSUE DATE / TIME: 202103171546
Unit Type and Rh: 6200
Unit Type and Rh: 6200

## 2020-03-07 LAB — FERRITIN: Ferritin: 20 ng/mL (ref 11–307)

## 2020-03-07 SURGERY — ESOPHAGOGASTRODUODENOSCOPY (EGD) WITH PROPOFOL
Anesthesia: Monitor Anesthesia Care | Laterality: Left

## 2020-03-07 MED ORDER — BISACODYL 5 MG PO TBEC
5.0000 mg | DELAYED_RELEASE_TABLET | Freq: Once | ORAL | Status: AC
  Administered 2020-03-07: 5 mg via ORAL
  Filled 2020-03-07: qty 1

## 2020-03-07 MED ORDER — LIDOCAINE 2% (20 MG/ML) 5 ML SYRINGE
INTRAMUSCULAR | Status: DC | PRN
  Administered 2020-03-07: 80 mg via INTRAVENOUS

## 2020-03-07 MED ORDER — SODIUM CHLORIDE 0.9 % IV SOLN: INTRAVENOUS | Status: DC | PRN

## 2020-03-07 MED ORDER — SODIUM CHLORIDE 0.9 % IV SOLN: 510.0000 mg | Freq: Once | INTRAVENOUS | Status: DC

## 2020-03-07 MED ORDER — PEG 3350-KCL-NA BICARB-NACL 420 G PO SOLR
4000.0000 mL | Freq: Once | ORAL | Status: AC
  Administered 2020-03-07: 4000 mL via ORAL
  Filled 2020-03-07: qty 4000

## 2020-03-07 MED ORDER — PROPOFOL 500 MG/50ML IV EMUL
INTRAVENOUS | Status: DC | PRN
  Administered 2020-03-07: 120 ug/kg/min via INTRAVENOUS

## 2020-03-07 MED ORDER — SODIUM CHLORIDE 0.9 % IV SOLN
510.0000 mg | Freq: Once | INTRAVENOUS | Status: AC
  Administered 2020-03-07: 510 mg via INTRAVENOUS
  Filled 2020-03-07: qty 17

## 2020-03-07 SURGICAL SUPPLY — 15 items

## 2020-03-07 NOTE — Progress Notes (Signed)
Pt in bathroom at this time.

## 2020-03-07 NOTE — Progress Notes (Addendum)
Subjective: HD#1 Events Overnight: s/p transfusion of 2 units overnight.   She reports that she is feeling stronger since admission and tolerated her endoscopy well without much complication. Her last bowel movement was about 30 mins ago and reported of melena. She currently denies abdominal pain nausea. or vomiting.   Objective:  Vital signs in last 24 hours: Vitals:   03/06/20 1630 03/06/20 1734 03/06/20 2113 03/07/20 0003  BP: (!) 130/56 (!) 139/49 (!) 147/69 (!) 121/55  Pulse:  71 78 72  Resp:  19 18 19   Temp:  97.9 F (36.6 C) 98.4 F (36.9 C) 98.4 F (36.9 C)  TempSrc:  Oral Oral Oral  SpO2:  100% 100% 97%  Weight:  75.3 kg    Height:  5\' 3"  (1.6 m)       Physical Exam: Physical Exam  Filed Weights   03/06/20 1734  Weight: 75.3 kg     Intake/Output Summary (Last 24 hours) at 03/07/2020 0547 Last data filed at 03/06/2020 2152 Gross per 24 hour  Intake 940 ml  Output   Net 940 ml    Risk Score:  -CHA2DS2-VASc: 6 points, risk of stroke is 9.7% and risk of stroke/TIA/systemic embolism is 13.6 %/year   Pertinent labs/Imaging: CBC Latest Ref Rng & Units 03/06/2020 03/06/2020 08/21/2019  WBC 4.0 - 10.5 K/uL 8.4 8.2 6.9  Hemoglobin 12.0 - 15.0 g/dL 03/08/2020) 7.0(L) 13.7  Hematocrit 36.0 - 46.0 % 27.0(L) 22.8(L) 42.4  Platelets 150 - 400 K/uL 237 252 206    CMP Latest Ref Rng & Units 03/06/2020 08/21/2019 04/14/2018  Glucose 70 - 99 mg/dL 08/23/2019) 04/16/2018) 096(E)  BUN 8 - 23 mg/dL 454(U) 15 13  Creatinine 0.44 - 1.00 mg/dL 981(X) 91(Y) 7.82(N)  Sodium 135 - 145 mmol/L 137 143 138  Potassium 3.5 - 5.1 mmol/L 4.0 5.7(H) 4.5  Chloride 98 - 111 mmol/L 105 106 102  CO2 22 - 32 mmol/L 20(L) 24 26  Calcium 8.9 - 10.3 mg/dL 5.62(Z) 3.08(M 9.3  Total Protein 6.5 - 8.1 g/dL 5.7(Q) 6.8 7.2  Total Bilirubin 0.3 - 1.2 mg/dL 0.5 0.9 1.0  Alkaline Phos 38 - 126 U/L 53 76 86  AST 15 - 41 U/L 18 19 27   ALT 0 - 44 U/L 11 11 17    Iron/TIBC/Ferritin/ %Sat    Component Value  Date/Time   IRON 15 (L) 03/07/2020 0523   TIBC 335 03/07/2020 0523   FERRITIN 20 03/07/2020 0523   IRONPCTSAT 4 (L) 03/07/2020 0523  Reticulocyte index: 1.21 Ganzoni: 1151 mg iron deficient    EGD: - Medium-sized hiatal hernia. - Widely patent Schatzki's ring. - Normal stomach. - Normal duodenal bulb, first portion of the duodenum and second portion of the duodenum. - No specimens collected.    Assessment/Plan:  Principal Problem:   Acute GI bleeding Active Problems:   Paroxysmal atrial fibrillation (HCC)   Chronic diastolic heart failure [facility])   Patient Summary: [name] Baird is a 86 y.o. with pertinent PMH of  pulmonary stenosis s/p valvotomy, ASD s/p closure, Afib, flutter s/p ablation 2014, complete heart block s/p pacemaker, pulmonary artery aneurysm, HFmrEF, GERD  who presented with hematochezia and shortness of breath and admit for acute GI bleed on hospital day 1  #Acute GI bleeding: Patient tolerated EGD.  Her EGD showed no overt signs of bleeding.  GI plans to perform a colonoscopy tomorrow to further evaluate her GI bleed.  Her most recent bowel movement was roughly an hour ago with a  mixture of solid and liquid stool with dark red blood.  She denies any nausea, vomiting, abdominal pain.  She states that she is feeling well and denies any weakness, or shortness of breath.  She currently has an iron deficit of roughly 1155 mg of iron.  Already received 2 transfusions of packed red blood cells.  I will supplement the rest of the iron via IV iron today. -Continue PPI -Holding Xarelto due to active bleeding -Appreciate GIs recommendation -Avoid NSAIDs moving forward -Supplement IV iron with Feraheme (510 mg)  #GERD: Patient denies any gastroesophageal reflux symptoms today.  We will continue her on her home PPI. - Continue PPI - Stop NSAIDs  #HFmrEF: Patient denies any signs or symptoms of anemia.  She states that she is breathing well on room air.  No  obvious swelling in her lower extremities or abdominal bloating -Lasix were held on admission due to acute blood loss with poor p.o. intake. -We will restart her home dose of Lasix once the patient's GI bleed resolves or if there is any signs of hypervolemia clinically.   #Atrial flutter/fibrillation s/p ablation  Patient is on Xarelto for atrial fibrillation/flutter. She has a CHA2DS2-VASc of 6 and will need anticoagulation once the the GI bleed resolves.   -Xarelto is being held during this hospitalization due to GI bleed -Continue nebivolol   Diet: clear liquids today, NPO after MN IVF: None,None VTE: SCDs, no lovenox due to active bleeding Code: Full PT/OT recs: Pending TOC recs: Pending   Dispo: Anticipated discharge pending clinical improvement.    Marianna Payment, D.O. [facility], PGY-1 Date 03/07/2020 Time 5:47 AM

## 2020-03-07 NOTE — Anesthesia Postprocedure Evaluation (Signed)
Anesthesia Post Note  Patient: Nou Chard Bunt  Procedure(s) Performed: ESOPHAGOGASTRODUODENOSCOPY (EGD) WITH PROPOFOL (Left )     Patient location during evaluation: Endoscopy Anesthesia Type: MAC Level of consciousness: awake and alert Pain management: pain level controlled Vital Signs Assessment: post-procedure vital signs reviewed and stable Respiratory status: spontaneous breathing, nonlabored ventilation and respiratory function stable Cardiovascular status: blood pressure returned to baseline and stable Postop Assessment: no apparent nausea or vomiting Anesthetic complications: no    Last Vitals:  Vitals:   03/07/20 1110 03/07/20 1120  BP:  (!) 130/56  Pulse:  72  Resp: (!) 25 20  Temp:    SpO2:  95%    Last Pain:  Vitals:   03/07/20 1118  TempSrc:   PainSc: 0-No pain                 Yoshie Kosel,W. EDMOND

## 2020-03-07 NOTE — Anesthesia Preprocedure Evaluation (Addendum)
Anesthesia Evaluation  Patient identified by MRN, date of birth, ID band Patient awake    Reviewed: Allergy & Precautions, H&P , NPO status , Patient's Chart, lab work & pertinent test results, reviewed documented beta blocker date and time   Airway Mallampati: III  TM Distance: >3 FB Neck ROM: Full    Dental no notable dental hx. (+) Teeth Intact, Dental Advisory Given   Pulmonary neg pulmonary ROS,    Pulmonary exam normal breath sounds clear to auscultation       Cardiovascular hypertension, Pt. on medications and Pt. on home beta blockers + dysrhythmias + pacemaker  Rhythm:Regular Rate:Normal     Neuro/Psych Anxiety Depression negative neurological ROS     GI/Hepatic negative GI ROS, Neg liver ROS,   Endo/Other  negative endocrine ROS  Renal/GU negative Renal ROS  negative genitourinary   Musculoskeletal   Abdominal   Peds  Hematology negative hematology ROS (+)   Anesthesia Other Findings   Reproductive/Obstetrics negative OB ROS                            Anesthesia Physical Anesthesia Plan  ASA: III  Anesthesia Plan: MAC   Post-op Pain Management:    Induction: Intravenous  PONV Risk Score and Plan: 2 and Propofol infusion  Airway Management Planned: Nasal Cannula  Additional Equipment:   Intra-op Plan:   Post-operative Plan:   Informed Consent: I have reviewed the patients History and Physical, chart, labs and discussed the procedure including the risks, benefits and alternatives for the proposed anesthesia with the patient or authorized representative who has indicated his/her understanding and acceptance.     Dental advisory given  Plan Discussed with: CRNA  Anesthesia Plan Comments:         Anesthesia Quick Evaluation

## 2020-03-07 NOTE — Interval H&P Note (Signed)
History and Physical Interval Note:  03/07/2020 10:21 AM  [name] Baird  has presented today for surgery, with the diagnosis of melena, anemia.  The various methods of treatment have been discussed with the patient and family. After consideration of risks, benefits and other options for treatment, the patient has consented to  Procedure(s): ESOPHAGOGASTRODUODENOSCOPY (EGD) WITH PROPOFOL (Left) as a surgical intervention.  The patient's history has been reviewed, patient examined, no change in status, stable for surgery.  I have reviewed the patient's chart and labs.  Questions were answered to the patient's satisfaction.     Freddy Jaksch

## 2020-03-07 NOTE — Transfer of Care (Signed)
Immediate Anesthesia Transfer of Care Note  Patient: [name] Baird  Procedure(s) Performed: ESOPHAGOGASTRODUODENOSCOPY (EGD) WITH PROPOFOL (Left )  Patient Location: PACU  Anesthesia Type:MAC  Level of Consciousness: drowsy  Airway & Oxygen Therapy: Patient Spontanous Breathing and Patient connected to nasal cannula oxygen  Post-op Assessment: Report given to RN and Post -op Vital signs reviewed and stable  Post vital signs: Reviewed and stable  Last Vitals:  Vitals Value Taken Time  BP 152/51 03/07/20 1109  Temp 36.5 C 03/07/20 1109  Pulse 90 03/07/20 1109  Resp 25 03/07/20 1109  SpO2 96 % 03/07/20 1109  Vitals shown include unvalidated device data.  Last Pain:  Vitals:   03/07/20 1109  TempSrc: Tympanic  PainSc: 0-No pain         Complications: No apparent anesthesia complications

## 2020-03-07 NOTE — Op Note (Signed)
[facility] Patient Name: [name] Baird Procedure Date : 03/07/2020 MRN: [id number] Attending MD: Arta Silence , MD Date of Birth: [birth year] CSN: [id number] Age: 86 Admit Type: Inpatient Procedure:                Upper GI endoscopy Indications:              Acute post hemorrhagic anemia, Hematochezia, Melena Providers:                Arta Silence, MD, Josie Dixon, RN, Marguerita Merles, Technician Referring MD:              Medicines:                Monitored Anesthesia Care Complications:            No immediate complications. Estimated Blood Loss:     Estimated blood loss: none. Procedure:                Pre-Anesthesia Assessment:                           - Prior to the procedure, a History and Physical                            was performed, and patient medications and                            allergies were reviewed. The patient's tolerance of                            previous anesthesia was also reviewed. The risks                            and benefits of the procedure and the sedation                            options and risks were discussed with the patient.                            All questions were answered, and informed consent                            was obtained. Prior Anticoagulants: The patient has                            taken Xarelto (rivaroxaban), last dose was 2 days                            prior to procedure. ASA Grade Assessment: III - A                            patient with severe systemic disease. After  reviewing the risks and benefits, the patient was                            deemed in satisfactory condition to undergo the                            procedure.                           After obtaining informed consent, the endoscope was                            passed under direct vision. Throughout the                            procedure, the patient's blood  pressure, pulse, and                            oxygen saturations were monitored continuously. The                            [id number]-[id number] ([id number]) Olympus gastroscope was                            introduced through the mouth, and advanced to the                            second part of duodenum. The upper GI endoscopy was                            accomplished without difficulty. The patient                            tolerated the procedure well. Scope In: Scope Out: Findings:      A medium-sized hiatal hernia was present. Widely patent >61mm Schatzki's       ring.      The exam of the esophagus was otherwise normal.      The entire examined stomach was normal.      Small periampullary diverticulum. Otherwise, the duodenal bulb, first       portion of the duodenum and second portion of the duodenum were normal. Impression:               - Medium-sized hiatal hernia.                           - Widely patent Schatzki's ring.                           - Normal stomach.                           - Normal duodenal bulb, first portion of the                            duodenum and second portion of the duodenum.                           -  No specimens collected. Moderate Sedation:      None Recommendation:           - Return patient to hospital ward for ongoing care.                           - Clear liquid diet today.                           - Continue present medications.                           - Perform a colonoscopy tomorrow.                           [facility] will follow. Procedure Code(s):        --- Professional ---                           [phone], Esophagogastroduodenoscopy, flexible,                            transoral; diagnostic, including collection of                            specimen(s) by brushing or washing, when performed                            (separate procedure) Diagnosis Code(s):        --- Professional ---                           K44.9,  Diaphragmatic hernia without obstruction or                            gangrene                           D62, Acute posthemorrhagic anemia                           K92.1, Melena (includes Hematochezia) CPT copyright 2019 [employer]. All rights reserved. The codes documented in this report are preliminary and upon coder review may  be revised to meet current compliance requirements. Willis Modena, MD 03/07/2020 11:15:57 AM This report has been signed electronically. Number of Addenda: 0

## 2020-03-08 ENCOUNTER — Encounter (HOSPITAL_COMMUNITY)
Admission: EM | Disposition: A | Discharge status: Home / Self Care | Payer: Self-pay | Source: Home / Self Care | Attending: Student in an Organized Health Care Education/Training Program

## 2020-03-08 ENCOUNTER — Inpatient Hospital Stay (HOSPITAL_COMMUNITY): Payer: Medicare Other | Admitting: Certified Registered Nurse Anesthetist

## 2020-03-08 DIAGNOSIS — D509 Iron deficiency anemia, unspecified: Secondary | ICD-10-CM

## 2020-03-08 DIAGNOSIS — Z881 Allergy status to other antibiotic agents status: Secondary | ICD-10-CM

## 2020-03-08 DIAGNOSIS — K648 Other hemorrhoids: Secondary | ICD-10-CM

## 2020-03-08 DIAGNOSIS — K644 Residual hemorrhoidal skin tags: Secondary | ICD-10-CM

## 2020-03-08 DIAGNOSIS — Z885 Allergy status to narcotic agent status: Secondary | ICD-10-CM

## 2020-03-08 HISTORY — PX: COLONOSCOPY WITH PROPOFOL: SHX5780

## 2020-03-08 LAB — CBC
HCT: 28.8 % — ABNORMAL LOW (ref 36.0–46.0)
Hemoglobin: 9.3 g/dL — ABNORMAL LOW (ref 12.0–15.0)
MCH: 29.7 pg (ref 26.0–34.0)
MCHC: 32.3 g/dL (ref 30.0–36.0)
MCV: 92 fL (ref 80.0–100.0)
Platelets: 223 10*3/uL (ref 150–400)
RBC: 3.13 MIL/uL — ABNORMAL LOW (ref 3.87–5.11)
RDW: 14.9 % (ref 11.5–15.5)
WBC: 6.3 10*3/uL (ref 4.0–10.5)
nRBC: 0.3 % — ABNORMAL HIGH (ref 0.0–0.2)

## 2020-03-08 LAB — GLUCOSE, CAPILLARY: Glucose-Capillary: 109 mg/dL — ABNORMAL HIGH (ref 70–99)

## 2020-03-08 LAB — COMPREHENSIVE METABOLIC PANEL
ALT: 15 U/L (ref 0–44)
AST: 26 U/L (ref 15–41)
Albumin: 3.5 g/dL (ref 3.5–5.0)
Alkaline Phosphatase: 59 U/L (ref 38–126)
Anion gap: 11 (ref 5–15)
BUN: 13 mg/dL (ref 8–23)
CO2: 21 mmol/L — ABNORMAL LOW (ref 22–32)
Calcium: 9.1 mg/dL (ref 8.9–10.3)
Chloride: 107 mmol/L (ref 98–111)
Creatinine, Ser: 1.22 mg/dL — ABNORMAL HIGH (ref 0.44–1.00)
GFR calc Af Amer: 47 mL/min — ABNORMAL LOW (ref >60)
GFR calc non Af Amer: 41 mL/min — ABNORMAL LOW (ref >60)
Glucose, Bld: 132 mg/dL — ABNORMAL HIGH (ref 70–99)
Potassium: 3.6 mmol/L (ref 3.5–5.1)
Sodium: 139 mmol/L (ref 135–145)
Total Bilirubin: 1.6 mg/dL — ABNORMAL HIGH (ref 0.3–1.2)
Total Protein: 5.8 g/dL — ABNORMAL LOW (ref 6.5–8.1)

## 2020-03-08 SURGERY — COLONOSCOPY WITH PROPOFOL
Anesthesia: Monitor Anesthesia Care | Laterality: Left

## 2020-03-08 MED ORDER — SODIUM CHLORIDE 0.9 % IV SOLN: INTRAVENOUS | Status: DC

## 2020-03-08 MED ORDER — RIVAROXABAN 15 MG PO TABS: 15.0000 mg | ORAL_TABLET | Freq: Every day | ORAL | 1 refills | Status: DC

## 2020-03-08 MED ORDER — PHENYLEPHRINE HCL-NACL 10-0.9 MG/250ML-% IV SOLN
INTRAVENOUS | Status: DC | PRN
  Administered 2020-03-08: 60 ug/min via INTRAVENOUS

## 2020-03-08 MED ORDER — SODIUM CHLORIDE 0.9 % IV SOLN: INTRAVENOUS | Status: DC | PRN

## 2020-03-08 MED ORDER — PROPOFOL 10 MG/ML IV BOLUS
INTRAVENOUS | Status: DC | PRN
  Administered 2020-03-08: 20 mg via INTRAVENOUS

## 2020-03-08 MED ORDER — PROPOFOL 500 MG/50ML IV EMUL
INTRAVENOUS | Status: DC | PRN
  Administered 2020-03-08: 75 ug/kg/min via INTRAVENOUS

## 2020-03-08 MED ORDER — LIDOCAINE HCL (CARDIAC) PF 100 MG/5ML IV SOSY
PREFILLED_SYRINGE | INTRAVENOUS | Status: DC | PRN
  Administered 2020-03-08: 80 mg via INTRAVENOUS

## 2020-03-08 MED ORDER — PHENYLEPHRINE HCL (PRESSORS) 10 MG/ML IV SOLN
INTRAVENOUS | Status: DC | PRN
  Administered 2020-03-08: 120 ug via INTRAVENOUS
  Administered 2020-03-08 (×2): 80 ug via INTRAVENOUS

## 2020-03-08 SURGICAL SUPPLY — 22 items

## 2020-03-08 NOTE — Op Note (Signed)
[facility] Patient Name: [name] Baird Procedure Date : 03/08/2020 MRN: [id number] Attending MD: Arta Silence , MD Date of Birth: 27-Mar-1933 CSN: [id number] Age: 86 Admit Type: Inpatient Procedure:                Colonoscopy Indications:              This is the patient's first colonoscopy,                            Hematochezia, Melena, Acute post hemorrhagic anemia Providers:                Arta Silence, MD, Jeanella Cara, RN, Cletis Athens, Technician, Raphael Gibney, CRNA Referring MD:             [facility] Medicines:                Monitored Anesthesia Care Complications:            No immediate complications. Estimated Blood Loss:     Estimated blood loss: none. Procedure:                Pre-Anesthesia Assessment:                           - Prior to the procedure, a History and Physical                            was performed, and patient medications and                            allergies were reviewed. The patient's tolerance of                            previous anesthesia was also reviewed. The risks                            and benefits of the procedure and the sedation                            options and risks were discussed with the patient.                            All questions were answered, and informed consent                            was obtained. Prior Anticoagulants: The patient has                            taken Xarelto (rivaroxaban), last dose was 3 days                            prior to procedure. ASA Grade Assessment: III - A  patient with severe systemic disease. After                            reviewing the risks and benefits, the patient was                            deemed in satisfactory condition to undergo the                            procedure.                           After obtaining informed consent, the colonoscope   was passed under direct vision. Throughout the                            procedure, the patient's blood pressure, pulse, and                            oxygen saturations were monitored continuously. The                            [id number]-PH190L ([id number]) Olympus ultra slim colonoscope                            was introduced through the anus and advanced to the                            the cecum, identified by appendiceal orifice and                            ileocecal valve. The colonoscopy was performed                            without difficulty. The patient tolerated the                            procedure well. The quality of the bowel                            preparation was good. Scope In: 11:33:11 AM Scope Out: 11:51:28 AM Scope Withdrawal Time: 0 hours 11 minutes 44 seconds  Total Procedure Duration: 0 hours 18 minutes 17 seconds  Findings:      Hemorrhoids were found on perianal exam.      Internal hemorrhoids were found during retroflexion. The hemorrhoids       were moderate.      Multiple small and large-mouthed diverticula were found in the sigmoid       colon, descending colon and transverse colon.      Colon otherwise normal; no other polyps, masses, vascular ectasias, or       inflammatory changes were seen.      No old or fresh blood was seen to the extent of our examination. Impression:               - Hemorrhoids found on perianal exam.                           -  Internal hemorrhoids.                           - Diverticulosis in the sigmoid colon, in the                            descending colon and in the transverse colon.                           - The exam was otherwise normal to the cecum. Moderate Sedation:      None Recommendation:           - Return patient to hospital ward for ongoing care.                           - Clear liquid diet today.                           - Continue present medications.                           [facility] will  follow. Procedure Code(s):        --- Professional ---                           [phone], Colonoscopy, flexible; diagnostic, including                            collection of specimen(s) by brushing or washing,                            when performed (separate procedure) Diagnosis Code(s):        --- Professional ---                           K64.8, Other hemorrhoids                           K92.1, Melena (includes Hematochezia)                           D62, Acute posthemorrhagic anemia                           K57.30, Diverticulosis of large intestine without                            perforation or abscess without bleeding CPT copyright 2019 American [employer] Association. All rights reserved. The codes documented in this report are preliminary and upon coder review may  be revised to meet current compliance requirements. Willis Modena, MD 03/08/2020 12:12:40 PM This report has been signed electronically. Number of Addenda: 0

## 2020-03-08 NOTE — Anesthesia Preprocedure Evaluation (Addendum)
Anesthesia Evaluation  Patient identified by MRN, date of birth, ID band Patient awake    Reviewed: Allergy & Precautions, NPO status , Patient's Chart, lab work & pertinent test results  Airway Mallampati: III  TM Distance: >3 FB Neck ROM: Full    Dental no notable dental hx.    Pulmonary neg pulmonary ROS,    Pulmonary exam normal breath sounds clear to auscultation       Cardiovascular hypertension, Pt. on medications and Pt. on home beta blockers +CHF  Normal cardiovascular exam+ dysrhythmias Atrial Fibrillation + pacemaker  Rhythm:Regular Rate:Normal  ECG: rate 74. A-fib   Neuro/Psych PSYCHIATRIC DISORDERS Anxiety Depression negative neurological ROS     GI/Hepatic Neg liver ROS, Bowel prep,GERD  Medicated and Controlled,  Endo/Other  negative endocrine ROS  Renal/GU negative Renal ROS     Musculoskeletal negative musculoskeletal ROS (+)   Abdominal   Peds  Hematology  (+) anemia , HLD   Anesthesia Other Findings anemia  gi bleeding  Reproductive/Obstetrics                            Anesthesia Physical Anesthesia Plan  ASA: III  Anesthesia Plan: MAC   Post-op Pain Management:    Induction: Intravenous  PONV Risk Score and Plan: 2 and Propofol infusion and Treatment may vary due to age or medical condition  Airway Management Planned: Simple Face Mask  Additional Equipment:   Intra-op Plan:   Post-operative Plan:   Informed Consent: I have reviewed the patients History and Physical, chart, labs and discussed the procedure including the risks, benefits and alternatives for the proposed anesthesia with the patient or authorized representative who has indicated his/her understanding and acceptance.     Dental advisory given  Plan Discussed with: CRNA  Anesthesia Plan Comments:        Anesthesia Quick Evaluation

## 2020-03-08 NOTE — Interval H&P Note (Signed)
History and Physical Interval Note:  03/08/2020 11:12 AM  [name] Baird  has presented today for surgery, with the diagnosis of anemia, gi bleeding.  The various methods of treatment have been discussed with the patient and family. After consideration of risks, benefits and other options for treatment, the patient has consented to  Procedure(s): COLONOSCOPY WITH PROPOFOL (Left) as a surgical intervention.  The patient's history has been reviewed, patient examined, no change in status, stable for surgery.  I have reviewed the patient's chart and labs.  Questions were answered to the patient's satisfaction.     Freddy Jaksch

## 2020-03-08 NOTE — Progress Notes (Signed)
Subjective: HD#2 Events Overnight: Patient Hgb was stable overnight. She tolerated the colonoscopy prep well.  Patient was seen this morning on rounds. She is feeling good about the colonoscopy today. She denies any nausea, vomiting, abdominal pain. Family at bedside states her stool become more clear overnight without any blood present. The stool was not completely see-through as there was still some small bits of food present. Family noted something outside patient's rectum although it is not bleeding or painful.   Plan discussed with patient and family. All questions and concerns addressed.   Objective:  Vital signs in last 24 hours: Vitals:   03/07/20 1110 03/07/20 1120 03/07/20 2153 03/08/20 0559  BP:  (!) 130/56 (!) 154/90 (!) 111/48  Pulse:  72 90 85  Resp: (!) 25 20 18 19   Temp:   97.6 F (36.4 C) 98.1 F (36.7 C)  TempSrc:   Oral Oral  SpO2:  95% (!) 83% 98%  Weight:    75.4 kg  Height:       Supplemental O2: RA  Physical Exam: Physical Exam  Constitutional: She is oriented to person, place, and time. No distress.  HENT:  Head: Normocephalic and atraumatic.  Eyes: EOM are normal.  Cardiovascular: Normal rate and intact distal pulses. Exam reveals no gallop.  No murmur heard. Pulmonary/Chest: Effort normal. No respiratory distress. She exhibits no tenderness.  Abdominal: Soft. She exhibits no distension. There is no abdominal tenderness.  Musculoskeletal:        General: No tenderness or edema. Normal range of motion.     Cervical back: Normal range of motion.  Neurological: She is alert and oriented to person, place, and time.  Skin: Skin is warm and dry. She is not diaphoretic.  Rectal exam: shows non-painful hemorrhoid at the right inferior-lateral aspect. No obvious masses were appreciated. There was no evidence of blood.    Filed Weights   03/06/20 1734 03/07/20 0551 03/08/20 0559  Weight: 75.3 kg 75.4 kg 75.4 kg     Intake/Output Summary (Last 24  hours) at 03/08/2020 03/10/2020 Last data filed at 03/07/2020 1826 Gross per 24 hour  Intake 700 ml  Output --  Net 700 ml    Risk Score:  n/a  Pertinent labs/Imaging: CBC Latest Ref Rng & Units 03/07/2020 03/06/2020 03/06/2020  WBC 4.0 - 10.5 K/uL 6.6 8.4 8.2  Hemoglobin 12.0 - 15.0 g/dL 03/08/2020) 8.9(L) 7.0(L)  Hematocrit 36.0 - 46.0 % 25.4(L) 27.0(L) 22.8(L)  Platelets 150 - 400 K/uL 195 237 252    CMP Latest Ref Rng & Units 03/07/2020 03/06/2020 08/21/2019  Glucose 70 - 99 mg/dL 08/23/2019) 324(M) 010(U)  BUN 8 - 23 mg/dL 20 725(D) 15  Creatinine 0.44 - 1.00 mg/dL 66(Y) 4.03(K) 7.42(V)  Sodium 135 - 145 mmol/L 141 137 143  Potassium 3.5 - 5.1 mmol/L 4.3 4.0 5.7(H)  Chloride 98 - 111 mmol/L 107 105 106  CO2 22 - 32 mmol/L 23 20(L) 24  Calcium 8.9 - 10.3 mg/dL 9.56(L) 8.7(F) 6.4(P  Total Protein 6.5 - 8.1 g/dL 5.2(L) 5.8(L) 6.8  Total Bilirubin 0.3 - 1.2 mg/dL 1.1 0.5 0.9  Alkaline Phos 38 - 126 U/L 49 53 76  AST 15 - 41 U/L 17 18 19   ALT 0 - 44 U/L 10 11 11     No results found.  Colonoscopy: - Hemorrhoids found on perianal exam. - Internal hemorrhoids. - Diverticulosis in the sigmoid colon, in the descending colon and in the transverse colon. - The exam was otherwise  normal to the cecum.  Assessment/Plan:  Principal Problem:   Acute GI bleeding Active Problems:   Paroxysmal atrial fibrillation (HCC)   Chronic diastolic heart failure [facility])    Patient Summary: [name] Baird is a 86 y.o. with pertinent PMH of pulmonary stenosiss/p valvotomy, ASD s/p closure, Afib, flutter s/p ablation 2014, complete heart block s/p pacemaker, pulmonary artery aneurysm, HFmrEF, GERD who was admit for GI bleed on hospital day 2  #Acute GI bleeding: Patient is s/p colonoscopy without sign of lower GI bleed. Patient Hgb is at able at 9.3. We will continue to hold her anticoagulation for 10 days to insure that bleeding has resolved. She will need to follow up with GI in the out patient setting.   - Discharge today.  #HFmrEF: We will restart home lasix at discharge. No sign of hypervolemia today.  #Atrial flutter/fibrillation s/p ablation  We will restart her anticoagulation after 10 days. She has been off of it for 2 days already since being admitted.  - Restart anticoagulation in 10 days.  Diet: Heart Healthy IVF: None,None VTE: SCDs Code: Full PT/OT recs: None TOC recs: none   Dispo: Anticipated discharge today.    Marianna Payment, D.O. [facility], PGY-1 Date 03/08/2020 Time 6:33 AM

## 2020-03-08 NOTE — Progress Notes (Signed)
Suspect bleeding from hemorrhoids +/- diverticulosis.  No active bleeding seen and no old/fresh blood seen in colon on colonoscopy.  Would hold rivaroxaban for another 10 days.  Advance diet.  OK to discharge home later today or tomorrow from GI perspective.  [facility] will make arrangements for outpatient GI follow-up.  [facility] will sign-off; please call with questions; thank you for the consultation.

## 2020-03-08 NOTE — Anesthesia Postprocedure Evaluation (Signed)
Anesthesia Post Note  Patient: [name] Baird  Procedure(s) Performed: COLONOSCOPY WITH PROPOFOL (Left )     Patient location during evaluation: Endoscopy Anesthesia Type: MAC Level of consciousness: awake Pain management: pain level controlled Vital Signs Assessment: post-procedure vital signs reviewed and stable Respiratory status: spontaneous breathing, nonlabored ventilation, respiratory function stable and patient connected to nasal cannula oxygen Cardiovascular status: stable and blood pressure returned to baseline Postop Assessment: no apparent nausea or vomiting Anesthetic complications: no    Last Vitals:  Vitals:   03/08/20 1210 03/08/20 1215  BP: 91/60 (!) 127/49  Pulse:  71  Resp: (!) 21 19  Temp:    SpO2: 100% 100%    Last Pain:  Vitals:   03/08/20 1215  TempSrc:   PainSc: 0-No pain                 Lance Huaracha P Damiean Lukes

## 2020-03-08 NOTE — Transfer of Care (Signed)
Immediate Anesthesia Transfer of Care Note  Patient: [name] Baird  Procedure(s) Performed: COLONOSCOPY WITH PROPOFOL (Left )  Patient Location: PACU and Endoscopy Unit  Anesthesia Type:MAC  Level of Consciousness: awake, alert  and oriented  Airway & Oxygen Therapy: Patient Spontanous Breathing and Patient connected to nasal cannula oxygen  Post-op Assessment: Report given to RN, Post -op Vital signs reviewed and stable and Patient moving all extremities  Post vital signs: Reviewed and stable  Last Vitals:  Vitals Value Taken Time  BP    Temp    Pulse    Resp    SpO2      Last Pain:  Vitals:   03/08/20 1032  TempSrc: Oral  PainSc: 0-No pain         Complications: No apparent anesthesia complications

## 2020-03-10 ENCOUNTER — Telehealth: Payer: Self-pay | Admitting: Physician Assistant

## 2020-03-10 NOTE — Telephone Encounter (Signed)
Patient was recently in the hospital for lower GI bleed, no source found on EGD/colonoscopy although diverticuli and hemorrhoids were seen.  On 3/19, Dr. Dulce Sellar stated that anticoagulation could be resumed in 10 days.  Because of her hospitalization, she did not get her Lasix 3/17 through 3/19 a.m.  After discharge from the hospital, her daughter-in-law gave her 1 Lasix tablet.  Her weight was up a couple of pounds and she was complaining of increased shortness of breath and chest pressure.  Her daughter-in-law gave her 1-1/2 Lasix tablets on 3/20.  Today, her weight was unchanged and she was still complaining of shortness of breath so her daughter-in-law gave her two Lasix tablets (40 mg).  She is wondering if it is okay to do this again tomorrow if she is still complaining of shortness of breath and her weight is still up.  [name] Baird also wonders if it is okay to hold the anticoagulation for the full 10 days.  Although her weight is only up 1 pound over her previous dry weight of 164 pounds, I suspect that she lost a pound or 2 in the hospital and her dry weight may be actually little bit lower.  It is okay to give her 2 Lasix tablets today as she has done, but I requested she give her an extra potassium tablet as well.  Advised that if the patient is having cramps, that is because of low potassium, not because of low fluid.  I requested that she limit all liquids to 2 L/quarts daily.  If [name] Baird has not improved by tomorrow, call us back because she may need to be seen.  Her creatinine was 1.22 at discharge, slightly above her baseline of 1.1, so she may need to check this week.  I will route this to [facility] triage as well as Dr. Royann Shivers.  Theodore Demark, PA-C 03/10/2020 10:10 AM

## 2020-03-12 NOTE — Telephone Encounter (Signed)
I agree with your advice needs a repeat BMET and CBC this week please

## 2020-03-12 NOTE — Discharge Summary (Addendum)
Name: [name] Baird [facility] MRN: [id number] DOB: 20-Mar-1933 86 y.o. PCP: Marletta Lor, NP  Date of Admission: 03/06/2020  8:21 AM Date of Discharge: 03/08/2020 Attending Physician: Erlinda Hong, MD  Discharge Diagnosis: Acute GI bleeding due to Diverticulosis Non-bleeding external and internal dilated hemorrhoids Iron deficiency anemia, symptomatic  Discharge Medications: Allergies as of 03/08/2020       Reactions   Sulfa Antibiotics Other (See Comments)   welps   Hydrocodone Nausea Only   nausea        Medication List     TAKE these medications    benzonatate 100 MG capsule Commonly known as: TESSALON Take 1 capsule (100 mg total) by mouth 2 (two) times daily.   calcium-vitamin D 250-125 MG-UNIT tablet Commonly known as: OSCAL Take 1 tablet by mouth daily.   candesartan 8 MG tablet Commonly known as: ATACAND TAKE 1 TABLET BY MOUTH EVERY DAY   clonazePAM 0.5 MG tablet Commonly known as: KLONOPIN Take 0.25 mg by mouth at bedtime.   dorzolamide-timolol 22.3-6.8 MG/ML ophthalmic solution Commonly known as: COSOPT Place 2 drops into both eyes 2 (two) times daily.   erythromycin ophthalmic ointment Place 1 application into the left eye at bedtime.   furosemide 40 MG tablet Commonly known as: LASIX TAKE 1 TABLET DAILY. MAY TAKE AN EXTRA DOSE DAILY AS NEEDED FOR WEIGHT GAIN. What changed: See the new instructions.   latanoprost 0.005 % ophthalmic solution Commonly known as: XALATAN Place 1 drop into both eyes at bedtime.   losartan 50 MG tablet Commonly known as: COZAAR TAKE 1 TABLET DAILY (KEEP OFFICE VISIT)   MULTIVITAMIN PO Take 1 tablet by mouth daily.   nebivolol 10 MG tablet Commonly known as: Bystolic Take 1 tablet (10 mg total) by mouth daily.   omeprazole 20 MG capsule Commonly known as: PRILOSEC Take 20 mg by mouth daily.   omeprazole 40 MG capsule Commonly known as: PRILOSEC Take 1 capsule (40 mg total) by mouth daily.   potassium  chloride 10 MEQ tablet Commonly known as: KLOR-CON Take 1 tablet (10 mEq total) by mouth daily.   Rivaroxaban 15 MG Tabs tablet Commonly known as: Xarelto Take 1 tablet (15 mg total) by mouth daily with supper. Start taking on: March 15, 2020 What changed: These instructions start on March 15, 2020. If you are unsure what to do until then, ask your doctor or other care provider.   rosuvastatin 10 MG tablet Commonly known as: CRESTOR Take 10 mg by mouth at bedtime.   trimethoprim 100 MG tablet Commonly known as: TRIMPEX Take 100 mg by mouth 2 (two) times daily. For ten days   Tylenol Arthritis Pain 650 MG CR tablet Generic drug: acetaminophen Take 1,300 mg by mouth daily as needed for pain.   venlafaxine XR 75 MG 24 hr capsule Commonly known as: EFFEXOR-XR Take 75 mg by mouth daily.        Disposition and follow-up:   Ms.Delane Jakelyn Squyres was [facility] from [facility] in Good condition.  At the hospital follow up visit please address:  1.  Follow-up: 1) GI bleed - check CBC and follow up with GI, 2) Anticoagulation - start back in 10 days, 3) HFmrEF - start back on home HF medications   2.  Labs / imaging needed at time of follow-up: CBC, BMP   3.  Pending labs/ test needing follow-up: none  Follow-up Appointments: Follow-up Information     Marletta Lor, NP.   Specialty: Nurse Practitioner  Why: Please follow up in a week Contact information: [address] [place] [facility] [address] [phone]         Willis Modena, MD.   Specialty: Gastroenterology Contact information: [phone] [address] [place] [address] [phone]            Hospital Course by problem list: 1. GI bleed -patient presented to [facility] ED on 3/17 with acute onset hematochezia with diarrhea.  She denies any abdominal pain, nausea, or vomiting.  As the hematochezia progressed she noticed becoming increasingly short of breath with exertion.  Her  symptoms persisted for roughly 4 to 5 days prior to presentation.  She denies any high risk behaviors including excessive alcohol use or NSAIDs.  Patient is on anticoagulation for atrial fibrillation/a flutter, which was held in setting of acute GI bleed. Hemoglobin on admission was 7.0.  Patient was given 1 unit PRBCs improvement of her hemoglobin to 8.9.  She was started on IV PPI. GI was consulted for EGD/colonoscopy.  EGD showed mild hiatal hernia and Schatzki's ring without signs of bleeding.  Colonoscopy showed some diverticulosis without acute bleeding. Patient's anemia was concerning for iron deficiency and she was given Feraheme 510 mg IV during this hospitalization.  Patient was transitioned to oral PPI and discharged once hemoglobin was stable.  Patient was instructed to restart anticoagulation 10 days from admission on 03/26 and follow up with her PCP and GI in the outpatient setting.  Discharge Vitals:   BP (!) 127/49   Pulse 71   Temp 97.9 F (36.6 C) (Axillary)   Resp 19   Ht 5\' 3"  (1.6 m)   Wt 75.4 kg   SpO2 100%   BMI 29.46 kg/m   Pertinent Labs, Studies, and Procedures:  CBC Latest Ref Rng & Units 03/08/2020 03/07/2020 03/06/2020  WBC 4.0 - 10.5 K/uL 6.3 6.6 8.4  Hemoglobin 12.0 - 15.0 g/dL 03/08/2020) 1.2(I) 8.9(L)  Hematocrit 36.0 - 46.0 % 28.8(L) 25.4(L) 27.0(L)  Platelets 150 - 400 K/uL 223 195 237   CMP Latest Ref Rng & Units 03/08/2020 03/07/2020 03/06/2020  Glucose 70 - 99 mg/dL 03/08/2020) 767(M) 094(B)  BUN 8 - 23 mg/dL 13 20 096(G)  Creatinine 0.44 - 1.00 mg/dL 83(M) 6.29(U) 7.65(Y)  Sodium 135 - 145 mmol/L 139 141 137  Potassium 3.5 - 5.1 mmol/L 3.6 4.3 4.0  Chloride 98 - 111 mmol/L 107 107 105  CO2 22 - 32 mmol/L 21(L) 23 20(L)  Calcium 8.9 - 10.3 mg/dL 9.1 6.50(P) 5.4(S)  Total Protein 6.5 - 8.1 g/dL 5.6(C) 5.2(L) 5.8(L)  Total Bilirubin 0.3 - 1.2 mg/dL 1.2(X) 1.1 0.5  Alkaline Phos 38 - 126 U/L 59 49 53  AST 15 - 41 U/L 26 17 18   ALT 0 - 44 U/L 15 10 11        Colonoscopy: - Hemorrhoids found on perianal exam. - Internal hemorrhoids. - Diverticulosis in the sigmoid colon, in the descending colon and in the transverse colon. - The exam was otherwise normal to the cecum.  EGD: - Medium-sized hiatal hernia. - Widely patent Schatzki's ring. - Normal stomach. - Normal duodenal bulb, first portion of the duodenum and second portion of the duodenum. - No specimens collected.  Discharge Instructions: Discharge Instructions     Diet - low sodium heart healthy   Complete by: As directed    Discharge instructions   Complete by: As directed    You were hospitalized for GI bleed. Thank you for allowing 5.1(Z  to be part of your care.   Please arrange follow up with: 1. Your primary care provider 2. Gastroenterology physician    Please note these changes made to your medications:   Please START taking:  - Please start back on all of your home medications listed on this discharge paperwork.  - Please wait to start back on your Xerelto until 03/26.   Please STOP taking:    Please make sure to Follow up with GI doctor and start your xerelto on 03/26  Please call our clinic if you have any questions or concerns, we may be able to help and keep you from a long and expensive emergency room wait. Our clinic and after hours phone number is [phone], the best time to call is Monday through Friday 9 am to 4 pm but there is always someone available 24/7 if you have an emergency. If you need medication refills please notify your pharmacy one week in advance and they will send [place] a request.   Increase activity slowly   Complete by: As directed        Signed: Marianna Payment, MD 03/12/2020, 7:10 AM   Pager: [phone]

## 2020-03-12 NOTE — Telephone Encounter (Signed)
Call placed to the daughter. The patient is due for a follow up so one has been made 3/29 with Dr. Royann Shivers.   She will be getting lab work completed at her GI appointment.

## 2020-03-18 ENCOUNTER — Ambulatory Visit (INDEPENDENT_AMBULATORY_CARE_PROVIDER_SITE_OTHER): Payer: Medicare Other | Admitting: Cardiovascular Disease

## 2020-03-18 ENCOUNTER — Other Ambulatory Visit: Payer: Self-pay

## 2020-03-18 ENCOUNTER — Encounter: Payer: Self-pay | Admitting: Cardiovascular Disease

## 2020-03-18 VITALS — BP 138/72 | HR 82 | Ht 62.0 in | Wt 163.6 lb

## 2020-03-18 DIAGNOSIS — Z8774 Personal history of (corrected) congenital malformations of heart and circulatory system: Secondary | ICD-10-CM

## 2020-03-18 DIAGNOSIS — I480 Paroxysmal atrial fibrillation: Secondary | ICD-10-CM | POA: Diagnosis not present

## 2020-03-18 DIAGNOSIS — I5032 Chronic diastolic (congestive) heart failure: Secondary | ICD-10-CM | POA: Diagnosis not present

## 2020-03-18 DIAGNOSIS — Z950 Presence of cardiac pacemaker: Secondary | ICD-10-CM

## 2020-03-18 DIAGNOSIS — I281 Aneurysm of pulmonary artery: Secondary | ICD-10-CM

## 2020-03-18 DIAGNOSIS — I442 Atrioventricular block, complete: Secondary | ICD-10-CM | POA: Diagnosis not present

## 2020-03-18 DIAGNOSIS — Q221 Congenital pulmonary valve stenosis: Secondary | ICD-10-CM

## 2020-03-18 DIAGNOSIS — E782 Mixed hyperlipidemia: Secondary | ICD-10-CM

## 2020-03-18 MED ORDER — APIXABAN 5 MG PO TABS: 5.0000 mg | ORAL_TABLET | Freq: Two times a day (BID) | ORAL | 11 refills | Status: DC

## 2020-03-18 MED ORDER — FOLIC ACID 1 MG PO TABS: 1.0000 mg | ORAL_TABLET | Freq: Every day | ORAL | Status: DC

## 2020-03-18 MED ORDER — OMEPRAZOLE 40 MG PO CPDR: 40.0000 mg | DELAYED_RELEASE_CAPSULE | Freq: Every day | ORAL | 3 refills | Status: DC

## 2020-03-18 NOTE — Progress Notes (Signed)
ROS  Patient ID: [name] Baird, female   DOB: 1933/06/13, 86 y.o.   MRN: [id number]    Cardiology Office Note    Date:  03/18/2020   ID:  [name] Baird [place], DOB January 05, 1933, MRN [id number]  PCP:  Marletta Lor, NP  Cardiologist:   Thurmon Fair, MD   Chief Complaint  Patient presents with  . Shortness of Breath    History of Present Illness:  [name] Baird is a 86 y.o. female with a history of congenital heart disease (atrial septal defect (status post patch closure), pulmonic stenosis (status post valvotomy 1985), left pulmonary artery aneurysm, complete heart block status post dual-chamber permanent pacemaker implantation (Medtronic, last generator change 2014) and recurrent problems with paroxysmal atrial flutter and atrial fibrillation on Xarelto (atrial flutter ablation 2014). She has history of diastolic heart failure without recent exacerbation.  A couple of weeks ago she developed hematochezia and was hospitalized with acute anemia and a hemoglobin of 7 (previous baseline 13.7).  Following transfusion and interruption of her anticoagulant her hemoglobin is stabilized at about 9.3.  She has just restarted taking Xarelto for a couple of days and has not yet had any GI bleeding recurrence.  She does not have orthopnea but has reduced exercise tolerance.  She is not yet back to baseline.  She had some ankle swelling when she left the hospital.  She has restarted her diuretic (after this was held for 3 consecutive days during the hospital stay) and she is almost back to her previous estimated "dry weight".  Interrogation of her pacemaker today does not show any recent episodes of atrial fibrillation.  In fact she has not had any meaningful atrial arrhythmia since October 26, 2019 (a couple of episodes of paroxysmal atrial tachycardia noted on March 19 during her hospitalization and colonoscopy.).  Pacemaker function is otherwise normal with an estimated longevity of 6.5  years, good lead parameters, 90% atrial pacing and 100% ventricular pacing (pacemaker dependent due to complete heart block).  In May 2019 she saw Dr. Lowanda Foster Zwischenberger at [facility] to discuss treatment for pulmonary artery aneurysm.  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  Conservative management was recommended.  Echo performed there showed a left ventricular ejection fraction of 45-50%, severe left atrial enlargement, moderate regurgitation of all 4 cardiac valves, in addition to the dilated pulmonary artery and left pulmonary artery aneurysm.  Comprehensive pacemaker interrogation shows normal function.  Her current generator was implanted in 2016 (leads from 2007) and has an estimated longevity of another 5.5-8 years.  Manual testing of pacing thresholds was performed today.  Lead parameters are excellent.  She has 85 % atrial pacing and 100% ventricular pacing.  She has not had any atrial fibrillation in the last year. In the last 4 years she is only had a couple of episodes of atrial fibrillation.  In April 2017 she had one episode that lasted for 6 hours.  In March 2018 the atrial fibrillation only lasted for just over 2 minutes.  She had a 7 beat run of nonsustained ventricular tachycardia in March 2020.  She had a flutter ablation performed by Dr. Johney Frame in July 2014 and is not taking any antiarrhythmics at this time, other than a low dose of beta blocker. At the time of the EP study she was noted to have conventional counterclockwise isthmus-dependent right atrial flutter but then a second type of right atrial  flutter was noted briefly. It could not be reinduced for full study. She has done poorly on conventional beta-blockers (atenolol, metoprolol), but tolerates Bystolic well.   Past Medical History:  Diagnosis Date  . Anxiety   . Atrial flutter (HCC)    s/p ablation 06/29/2013 by Dr Johney Frame  . CHF (congestive heart  failure) (HCC)   . Complete heart block (HCC) 2007   s/p medtronic pacemaker implantation  . Depression   . Glaucoma   . Hypertension   . Pulmonary stenosis sp valvotomy       . Shortness of breath   . Status post patch closure of ASD     Past Surgical History:  Procedure Laterality Date  . ABLATION  06/29/2013   s/p isthmus ablation for atrial flutter by Dr Johney Frame  . AORTIC VALVE REPAIR    . ATRIAL FLUTTER ABLATION N/A 06/29/2013   Procedure: ATRIAL FLUTTER ABLATION;  Surgeon: Hillis Range, MD;  Location: [facility] CATH LAB;  Service: Cardiovascular;  Laterality: N/A;  . CARDIAC CATHETERIZATION N/A 06/27/2015   Procedure: Temporary Pacemaker;  Surgeon: Thurmon Fair, MD;  Location: [facility] INVASIVE CV LAB;  Service: Cardiovascular;  Laterality: N/A;  . CARDIOVERSION N/A 03/24/2013   Procedure: CARDIOVERSION;  Surgeon: Thurmon Fair, MD;  Location: [facility] ENDOSCOPY;  Service: Cardiovascular;  Laterality: N/A;  . COLONOSCOPY WITH PROPOFOL Left 03/08/2020   Procedure: COLONOSCOPY WITH PROPOFOL;  Surgeon: Willis Modena, MD;  Location: [facility] ENDOSCOPY;  Service: Endoscopy;  Laterality: Left;  . EP IMPLANTABLE DEVICE N/A 06/27/2015   Procedure: PPM Generator Changeout;  Surgeon: Thurmon Fair, MD;  Location: [facility] INVASIVE CV LAB;  Service: Cardiovascular;  Laterality: N/A;  . ESOPHAGOGASTRODUODENOSCOPY (EGD) WITH PROPOFOL Left 03/07/2020   Procedure: ESOPHAGOGASTRODUODENOSCOPY (EGD) WITH PROPOFOL;  Surgeon: Willis Modena, MD;  Location: [facility] ENDOSCOPY;  Service: Endoscopy;  Laterality: Left;  . PACEMAKER INSERTION  2007   Medtronic Adapta ADDRL1, serial # [id number]  . pulmonary valvotomy  1985  . trigeminal sx     left side    Outpatient Medications Prior to Visit  Medication Sig Dispense Refill  . benzonatate (TESSALON) 100 MG capsule Take 1 capsule (100 mg total) by mouth 2 (two) times daily. 21 capsule 0  . calcium-vitamin D (OSCAL) 250-125 MG-UNIT per tablet Take 1 tablet by mouth daily.    .  candesartan (ATACAND) 8 MG tablet TAKE 1 TABLET BY MOUTH EVERY DAY (Patient taking differently: Take 8 mg by mouth daily. ) 30 tablet 6  . clonazePAM (KLONOPIN) 0.5 MG tablet Take 0.25 mg by mouth at bedtime.     . dorzolamide-timolol (COSOPT) 22.3-6.8 MG/ML ophthalmic solution Place 2 drops into both eyes 2 (two) times daily.    [name] Kitchen erythromycin ophthalmic ointment Place 1 application into the left eye at bedtime.    . furosemide (LASIX) 40 MG tablet TAKE 1 TABLET DAILY. MAY TAKE AN EXTRA DOSE DAILY AS NEEDED FOR WEIGHT GAIN. (Patient taking differently: Take 40 mg by mouth See admin instructions. 40mg  once daily. May take an additional 40mg  once daily as needed for weight gain.) 180 tablet 4  . latanoprost (XALATAN) 0.005 % ophthalmic solution Place 1 drop into both eyes at bedtime.    . Multiple Vitamins-Minerals (MULTIVITAMIN PO) Take 1 tablet by mouth daily.     . nebivolol (BYSTOLIC) 10 MG tablet Take 1 tablet (10 mg total) by mouth daily. 30 tablet 6  . potassium chloride (K-DUR) 10 MEQ tablet Take 1 tablet (10 mEq total) by mouth daily. 30 tablet 1  .  rosuvastatin (CRESTOR) 10 MG tablet Take 10 mg by mouth at bedtime.    [name] Kitchen trimethoprim (TRIMPEX) 100 MG tablet Take 100 mg by mouth 2 (two) times daily. For ten days    . venlafaxine XR (EFFEXOR-XR) 75 MG 24 hr capsule Take 75 mg by mouth daily.    . Rivaroxaban (XARELTO) 15 MG TABS tablet Take 1 tablet (15 mg total) by mouth daily with supper. 90 tablet 1  . acetaminophen (TYLENOL ARTHRITIS PAIN) 650 MG CR tablet Take 1,300 mg by mouth daily as needed for pain.     [name] Kitchen losartan (COZAAR) 50 MG tablet TAKE 1 TABLET DAILY (KEEP OFFICE VISIT) (Patient not taking: Reported on 03/06/2020) 90 tablet 0  . omeprazole (PRILOSEC) 20 MG capsule Take 20 mg by mouth daily.    [name] Kitchen omeprazole (PRILOSEC) 40 MG capsule Take 1 capsule (40 mg total) by mouth daily. (Patient not taking: Reported on 03/06/2020) 90 capsule 3   Facility-Administered Medications Prior to  Visit  Medication Dose Route Frequency Provider Last Rate Last Admin  . triamcinolone acetonide (KENALOG) 10 MG/ML injection 10 mg  10 mg Other Once Lenn Sink, DPM         Allergies:   Sulfa antibiotics and Hydrocodone   Social History   Socioeconomic History  . Marital status: Widowed    Spouse name: Not on file  . Number of children: 2  . Years of education: Not on file  . Highest education level: Not on file  Occupational History  . Not on file  Tobacco Use  . Smoking status: Never Smoker  . Smokeless tobacco: Never Used  Substance and Sexual Activity  . Alcohol use: No  . Drug use: No  . Sexual activity: Never  Other Topics Concern  . Not on file  Social History Narrative  . Not on file   Social Determinants of Health   Financial Resource Strain:   . Difficulty of Paying Living Expenses:   Food Insecurity:   . Worried About Programme researcher, broadcasting/film/video in the Last Year:   . Barista in the Last Year:   Transportation Needs:   . Freight forwarder (Medical):   [name] Kitchen Lack of Transportation (Non-Medical):   Physical Activity:   . Days of Exercise per Week:   . Minutes of Exercise per Session:   Stress:   . Feeling of Stress :   Social Connections:   . Frequency of Communication with Friends and Family:   . Frequency of Social Gatherings with Friends and Family:   . Attends Religious Services:   . Active Member of Clubs or Organizations:   . Attends Banker Meetings:   [name] Kitchen Marital Status:     ROS:   Please see the history of present illness.    ROS All other systems reviewed and are negative.   PHYSICAL EXAM:   VS:  BP 138/72   Pulse 82   Ht 5\' 2"  (1.575 m)   Wt 163 lb 9.6 oz (74.2 kg)   BMI 29.92 kg/m      General: Alert, oriented x3, no distress, overweight.  Healthy right subclavian pacemaker site. Head: no evidence of trauma, PERRL, EOMI, no exophtalmos or lid lag, no myxedema, no xanthelasma; normal ears, nose and  oropharynx Neck: normal jugular venous pulsations and no hepatojugular reflux; brisk carotid pulses without delay and no carotid bruits Chest: clear to auscultation, no signs of consolidation by percussion or palpation, normal fremitus, symmetrical and full  respiratory excursions Cardiovascular: normal position and quality of the apical impulse, regular rhythm, normal first and paradoxically split second heart sounds, 2/6 ejection murmur in the pulmonic focus, 1/6 diastolic decrescendo murmur in the same location, no rubs or gallops Abdomen: no tenderness or distention, no masses by palpation, no abnormal pulsatility or arterial bruits, normal bowel sounds, no hepatosplenomegaly Extremities: no clubbing, cyanosis or edema; 2+ radial, ulnar and brachial pulses bilaterally; 2+ right femoral, posterior tibial and dorsalis pedis pulses; 2+ left femoral, posterior tibial and dorsalis pedis pulses; no subclavian or femoral bruits Neurological: grossly nonfocal Psych: Normal mood and affect   Wt Readings from Last 3 Encounters:  03/18/20 163 lb 9.6 oz (74.2 kg)  03/08/20 166 lb 4.8 oz (75.4 kg)  08/21/19 165 lb (74.8 kg)    Studies/Labs Reviewed:   EKG:  EKG is ordered today.  It shows AV sequential pacing  Lipid Panel     Component Value Date/Time   CHOL 132 08/21/2019 1043   TRIG 159 (H) 08/21/2019 1043   HDL 51 08/21/2019 1043   CHOLHDL 2.6 08/21/2019 1043   LDLCALC 54 08/21/2019 1043   BMET    Component Value Date/Time   NA 139 03/08/2020 0813   NA 143 08/21/2019 1043   K 3.6 03/08/2020 0813   K 4.2 09/13/2013 1609   CL 107 03/08/2020 0813   CO2 21 (L) 03/08/2020 0813   GLUCOSE 132 (H) 03/08/2020 0813   BUN 13 03/08/2020 0813   BUN 15 08/21/2019 1043   CREATININE 1.22 (H) 03/08/2020 0813   CREATININE 1.03 06/25/2015 1012   CALCIUM 9.1 03/08/2020 0813   GFRNONAA 41 (L) 03/08/2020 0813   GFRAA 47 (L) 03/08/2020 0813   ASSESSMENT:    1. Chronic diastolic heart failure (HCC)    2. Paroxysmal atrial fibrillation ([place])   3. CHB (complete heart block) (HCC)   4. Pacemaker   5. Mixed hyperlipidemia   6. Pulmonic stenosis, congenital   7. History of repair of congenital atrial septal defect (ASD)   8. Aneurysm of left pulmonary artery (HCC)     PLAN:  In order of problems listed above:  1. CHF, diastolic: She has borderline reduced LVEF, likely related to RV apical pacing.  Currently NYHA functional class II, probably due to anemia.  I do not think she would benefit from additional diuretic, with continue with iron and folic acid supplements to treat the anemia. 2. PAFlutter/ Afib: Very low burden of arrhythmia (0.1%), no meaningful episodes since November 2020.  She might do better with Eliquis rather than Xarelto due to the lower GI bleeding risk.  We will switch when she runs out of the current bottle of Xarelto.[name] Kitchen CHADSVasc4 (age 49, gender, HF). 3. CHB: Pacemaker dependent does not tolerate threshold testing well. 4. PPM: Normal device function.  Continue remote downloads every 3 months. 5. HLP: Borderline triglycerides, otherwise excellent lipid profile.  Recheck for diabetes mellitus today. 6. PS s/p surgical valvotomy: Mild to moderate regurgitation, no residual stenosis by echocardiogram 7. ASD s/p patch closure: No signs of residual shunt on echo, no evidence of right heart failure. 8. L PA aneurysm: 6.4 cm.  Plan conservative, non-surgical management.  Serial measurements did not appear to be indicated     Medication Adjustments/Labs and Tests Ordered: Current medicines are reviewed at length with the patient today.  Concerns regarding medicines are outlined above.  Medication changes, Labs and Tests ordered today are listed in the Patient Instructions below. Patient Instructions  Medication  Instructions:  STOP the Xarelto START Eliquis 5 mg twice daily.  START Folic Acid 1 mg once daily. You can get this over the counter.   *If you need a refill on  your cardiac medications before your next appointment, please call your pharmacy*   Lab Work: Your provider would like for you to have the following labs today: CBC, A1C and a BMET.  If you have labs (blood work) drawn today and your tests are completely normal, you will receive your results only by: [name] Kitchen. MyChart Message (if you have MyChart) OR . A paper copy in the mail If you have any lab test that is abnormal or we need to change your treatment, we will call you to review the results.   Testing/Procedures: None ordered   Follow-Up: At [facility], you and your health needs are our priority.  As part of our continuing mission to provide you with exceptional heart care, we have created designated Provider Care Teams.  These Care Teams include your primary Cardiologist (physician) and Advanced Practice Providers (APPs -  Physician Assistants and Nurse Practitioners) who all work together to provide you with the care you need, when you need it.  We recommend signing up for the patient portal called "MyChart".  Sign up information is provided on this After Visit Summary.  MyChart is used to connect with patients for Virtual Visits (Telemedicine).  Patients are able to view lab/test results, encounter notes, upcoming appointments, etc.  Non-urgent messages can be sent to your provider as well.   To learn more about what you can do with MyChart, go to [email or web address].    Your next appointment:   3 month(s) on a pacer day  The format for your next appointment:   In Person  Provider:   Thurmon FairMihai Marios Gaiser, MD     Signed, Thurmon FairMihai Emillia Weatherly, MD  03/18/2020 3:36 PM    [facility] [address], [place], [place]  [address] Phone: [phone]; Fax: [phone]

## 2020-03-18 NOTE — Patient Instructions (Addendum)
Medication Instructions:  STOP the Xarelto START Eliquis 5 mg twice daily.  START Folic Acid 1 mg once daily. You can get this over the counter.   *If you need a refill on your cardiac medications before your next appointment, please call your pharmacy*   Lab Work: Your provider would like for you to have the following labs today: CBC, A1C and a BMET.  If you have labs (blood work) drawn today and your tests are completely normal, you will receive your results only by: [name] Kitchen MyChart Message (if you have MyChart) OR . A paper copy in the mail If you have any lab test that is abnormal or we need to change your treatment, we will call you to review the results.   Testing/Procedures: None ordered   Follow-Up: At [facility], you and your health needs are our priority.  As part of our continuing mission to provide you with exceptional heart care, we have created designated Provider Care Teams.  These Care Teams include your primary Cardiologist (physician) and Advanced Practice Providers (APPs -  Physician Assistants and Nurse Practitioners) who all work together to provide you with the care you need, when you need it.  We recommend signing up for the patient portal called "MyChart".  Sign up information is provided on this After Visit Summary.  MyChart is used to connect with patients for Virtual Visits (Telemedicine).  Patients are able to view lab/test results, encounter notes, upcoming appointments, etc.  Non-urgent messages can be sent to your provider as well.   To learn more about what you can do with MyChart, go to [email or web address].    Your next appointment:   3 month(s) on a pacer day  The format for your next appointment:   In Person  Provider:   Thurmon Fair, MD

## 2020-03-19 LAB — BASIC METABOLIC PANEL
BUN/Creatinine Ratio: 10 — ABNORMAL LOW (ref 12–28)
BUN: 11 mg/dL (ref 8–27)
CO2: 26 mmol/L (ref 20–29)
Calcium: 9.8 mg/dL (ref 8.7–10.3)
Chloride: 104 mmol/L (ref 96–106)
Creatinine, Ser: 1.06 mg/dL — ABNORMAL HIGH (ref 0.57–1.00)
GFR calc Af Amer: 56 mL/min/{1.73_m2} — ABNORMAL LOW (ref >59)
GFR calc non Af Amer: 48 mL/min/{1.73_m2} — ABNORMAL LOW (ref >59)
Glucose: 116 mg/dL — ABNORMAL HIGH (ref 65–99)
Potassium: 5 mmol/L (ref 3.5–5.2)
Sodium: 143 mmol/L (ref 134–144)

## 2020-03-19 LAB — HEMOGLOBIN A1C
Est. average glucose Bld gHb Est-mCnc: 103 mg/dL
Hgb A1c MFr Bld: 5.2 % (ref 4.8–5.6)

## 2020-03-19 LAB — CBC WITH DIFFERENTIAL
Basophils Absolute: 0 10*3/uL (ref 0.0–0.2)
Basos: 0 %
EOS (ABSOLUTE): 0.2 10*3/uL (ref 0.0–0.4)
Eos: 3 %
Hematocrit: 32.3 % — ABNORMAL LOW (ref 34.0–46.6)
Hemoglobin: 10.3 g/dL — ABNORMAL LOW (ref 11.1–15.9)
Immature Grans (Abs): 0 10*3/uL (ref 0.0–0.1)
Immature Granulocytes: 0 %
Lymphocytes Absolute: 1 10*3/uL (ref 0.7–3.1)
Lymphs: 14 %
MCH: 30.4 pg (ref 26.6–33.0)
MCHC: 31.9 g/dL (ref 31.5–35.7)
MCV: 95 fL (ref 79–97)
Monocytes Absolute: 0.5 10*3/uL (ref 0.1–0.9)
Monocytes: 7 %
Neutrophils Absolute: 5.5 10*3/uL (ref 1.4–7.0)
Neutrophils: 76 %
RBC: 3.39 x10E6/uL — ABNORMAL LOW (ref 3.77–5.28)
RDW: 13.3 % (ref 11.7–15.4)
WBC: 7.3 10*3/uL (ref 3.4–10.8)

## 2020-03-22 ENCOUNTER — Other Ambulatory Visit: Payer: Self-pay | Admitting: Cardiovascular Disease

## 2020-04-09 ENCOUNTER — Other Ambulatory Visit: Payer: Self-pay

## 2020-05-06 ENCOUNTER — Other Ambulatory Visit: Payer: Self-pay | Admitting: Adult Health

## 2020-05-06 DIAGNOSIS — Z1231 Encounter for screening mammogram for malignant neoplasm of breast: Secondary | ICD-10-CM

## 2020-05-29 ENCOUNTER — Ambulatory Visit (INDEPENDENT_AMBULATORY_CARE_PROVIDER_SITE_OTHER): Payer: Medicare Other | Admitting: *Deleted

## 2020-05-29 DIAGNOSIS — I495 Sick sinus syndrome: Secondary | ICD-10-CM

## 2020-05-29 LAB — CUP PACEART REMOTE DEVICE CHECK
Battery Impedance: 527 Ohm
Battery Remaining Longevity: 74 mo
Battery Voltage: 2.77 V
Brady Statistic AP VP Percent: 96 %
Brady Statistic AP VS Percent: 0 %
Brady Statistic AS VP Percent: 4 %
Brady Statistic AS VS Percent: 0 %
Date Time Interrogation Session: 20210609065256
Implantable Lead Implant Date: 20070920
Implantable Lead Implant Date: 20070920
Implantable Lead Location: 753859
Implantable Lead Location: 753860
Implantable Lead Model: 4592
Implantable Lead Model: 5092
Implantable Pulse Generator Implant Date: 20160707
Lead Channel Impedance Value: 480 Ohm
Lead Channel Impedance Value: 746 Ohm
Lead Channel Pacing Threshold Amplitude: 0.625 V
Lead Channel Pacing Threshold Amplitude: 1.25 V
Lead Channel Pacing Threshold Pulse Width: 0.4 ms
Lead Channel Pacing Threshold Pulse Width: 0.4 ms
Lead Channel Setting Pacing Amplitude: 2 V
Lead Channel Setting Pacing Amplitude: 2.5 V
Lead Channel Setting Pacing Pulse Width: 0.4 ms
Lead Channel Setting Sensing Sensitivity: 5.6 mV

## 2020-05-30 NOTE — Progress Notes (Signed)
Remote pacemaker transmission.   

## 2020-06-03 ENCOUNTER — Ambulatory Visit
Admission: RE | Admit: 2020-06-03 | Discharge: 2020-06-03 | Disposition: A | Discharge status: Home / Self Care | Payer: Medicare Other | Source: Ambulatory Visit | Attending: *Deleted | Admitting: *Deleted

## 2020-06-03 ENCOUNTER — Encounter: Payer: Self-pay | Admitting: Cardiovascular Disease

## 2020-06-03 ENCOUNTER — Ambulatory Visit (INDEPENDENT_AMBULATORY_CARE_PROVIDER_SITE_OTHER): Payer: Medicare Other | Admitting: Cardiovascular Disease

## 2020-06-03 ENCOUNTER — Other Ambulatory Visit: Payer: Self-pay

## 2020-06-03 ENCOUNTER — Encounter: Payer: Medicare Other | Admitting: Cardiovascular Disease

## 2020-06-03 VITALS — BP 138/80 | HR 76 | Ht 62.0 in | Wt 163.0 lb

## 2020-06-03 DIAGNOSIS — I370 Nonrheumatic pulmonary valve stenosis: Secondary | ICD-10-CM

## 2020-06-03 DIAGNOSIS — E559 Vitamin D deficiency, unspecified: Secondary | ICD-10-CM

## 2020-06-03 DIAGNOSIS — E782 Mixed hyperlipidemia: Secondary | ICD-10-CM

## 2020-06-03 DIAGNOSIS — I480 Paroxysmal atrial fibrillation: Secondary | ICD-10-CM | POA: Diagnosis not present

## 2020-06-03 DIAGNOSIS — Z950 Presence of cardiac pacemaker: Secondary | ICD-10-CM | POA: Diagnosis not present

## 2020-06-03 DIAGNOSIS — I281 Aneurysm of pulmonary artery: Secondary | ICD-10-CM

## 2020-06-03 DIAGNOSIS — I5032 Chronic diastolic (congestive) heart failure: Secondary | ICD-10-CM

## 2020-06-03 DIAGNOSIS — Z1231 Encounter for screening mammogram for malignant neoplasm of breast: Secondary | ICD-10-CM

## 2020-06-03 DIAGNOSIS — D649 Anemia, unspecified: Secondary | ICD-10-CM

## 2020-06-03 DIAGNOSIS — Z1329 Encounter for screening for other suspected endocrine disorder: Secondary | ICD-10-CM

## 2020-06-03 DIAGNOSIS — Z8774 Personal history of (corrected) congenital malformations of heart and circulatory system: Secondary | ICD-10-CM

## 2020-06-03 DIAGNOSIS — I442 Atrioventricular block, complete: Secondary | ICD-10-CM | POA: Diagnosis not present

## 2020-06-03 LAB — PACEMAKER DEVICE OBSERVATION

## 2020-06-03 MED ORDER — FUROSEMIDE 40 MG PO TABS: 40.0000 mg | ORAL_TABLET | ORAL | 5 refills | Status: DC

## 2020-06-03 MED ORDER — POTASSIUM CHLORIDE CRYS ER 10 MEQ PO TBCR: 10.0000 meq | EXTENDED_RELEASE_TABLET | Freq: Every day | ORAL | 5 refills | Status: DC

## 2020-06-03 NOTE — Patient Instructions (Signed)
Medication Instructions:  No changes If you need a refill on your cardiac medications before your next appointment, please call your pharmacy.   Lab work: Your provider would like for you to have the following labs today: CBC, Thyroid panel, Vitamin D and fasting Lipid  If you have labs (blood work) drawn today and your tests are completely normal, you will receive your results only by: MyChart Message (if you have MyChart) OR A paper copy in the mail If you have any lab test that is abnormal or we need to change your treatment, we will call you to review the results.  Testing/Procedures: None ordered  Follow-Up: At [facility], you and your health needs are our priority.  As part of our continuing mission to provide you with exceptional heart care, we have created designated Provider Care Teams.  These Care Teams include your primary Cardiologist (physician) and Advanced Practice Providers (APPs -  Physician Assistants and Nurse Practitioners) who all work together to provide you with the care you need, when you need it.  We recommend signing up for the patient portal called "MyChart".  Sign up information is provided on this After Visit Summary.  MyChart is used to connect with patients for Virtual Visits (Telemedicine).  Patients are able to view lab/test results, encounter notes, upcoming appointments, etc.  Non-urgent messages can be sent to your provider as well.   To learn more about what you can do with MyChart, go to [email or web address].    Your next appointment:   6 month(s)  The format for your next appointment:   In Person  Provider:   Thurmon Fair, MD   Remote monitoring is used to monitor your pacemaker from home. This monitoring reduces the number of office visits required to check your device to one time per year. It allows [place] to keep an eye on the functioning of your device to ensure it is working properly. You are scheduled for a device check from home on  08/28/20. You may send your transmission at any time that day. If you have a wireless device, the transmission will be sent automatically.

## 2020-06-03 NOTE — Progress Notes (Signed)
ROS  Patient ID: [name] Baird, female   DOB: 01-23-1933, 86 y.o.   MRN: [id number]    Cardiology Office Note    Date:  06/03/2020   ID:  Hector Venne Enders, DOB 12-23-1932, MRN [id number]  PCP:  Marletta Lor, NP  Cardiologist:   Thurmon Fair, MD   Chief Complaint  Patient presents with  . Edema    bilateral ankles    History of Present Illness:  [name] Baird is a 86 y.o. female with a history of congenital heart disease (atrial septal defect (status post patch closure), pulmonic stenosis (status post valvotomy 1985), left pulmonary artery aneurysm, complete heart block status post dual-chamber permanent pacemaker implantation (Medtronic, last generator change 2014) and recurrent problems with paroxysmal atrial flutter and atrial fibrillation (atrial flutter ablation 2014). She has history of diastolic heart failure with infrequent exacerbation, most recently following an episode of lower GI bleeding with severe anemia in March 2021.  Xarelto was switched to Eliquis after that episode of bleeding.  She has done well since her last appointment without any episodes of diarrhea.  She still complains of easy fatigue, but does not have this at the end of the evening she may develop mild edema, but this is always resolved by the next day.  She has not required diuretic dose adjustments and her weight has been stable at around 162 pounds.  She complains of some aching in her legs at rest, but not intermittent claudication.  She has no chest pain, dizziness, palpitations, focal neurological events, falls or injuries.  Interrogation of her pacemaker today does not show any arrhythmia since the couple of episodes of atrial tachycardia that she had during her hospitalization with GI bleeding in March.  Device function is normal. Her current generator was implanted in 2016 (leads from 2007). Estimated generator longevity is 6 years.  She has 96% atrial pacing and 4% ventricular pacing.   She has rather blunted histograms, but is quite sedentary.  She is pacemaker dependent due to complete heart block.  In May 2019 she saw Dr. Lowanda Foster Zwischenberger at [facility] to discuss treatment for pulmonary artery aneurysm.  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  Conservative management was recommended.  Echo performed there showed a left ventricular ejection fraction of 45-50%, severe left atrial enlargement, moderate regurgitation of all 4 cardiac valves, in addition to the dilated pulmonary artery and left pulmonary artery aneurysm.  In April 2017 she had one episode that lasted for 6 hours.  In March 2018 the atrial fibrillation only lasted for just over 2 minutes.  She had a 7 beat run of nonsustained ventricular tachycardia in March 2020.  She had a flutter ablation performed by Dr. Johney Frame in July 2014 and is not taking any antiarrhythmics at this time, other than a low dose of beta blocker. At the time of the EP study she was noted to have conventional counterclockwise isthmus-dependent right atrial flutter but then a second type of right atrial flutter was noted briefly. It could not be reinduced for full study. She has done poorly on conventional beta-blockers (atenolol, metoprolol), but tolerates Bystolic well.   Past Medical History:  Diagnosis Date  . Anxiety   . Atrial flutter (HCC)    s/p ablation 06/29/2013 by Dr Johney Frame  . CHF (congestive heart failure) (HCC)   . Complete heart block (HCC) 2007   s/p medtronic pacemaker implantation  .  Depression   . Glaucoma   . Hypertension   . Pulmonary stenosis sp valvotomy       . Shortness of breath   . Status post patch closure of ASD     Past Surgical History:  Procedure Laterality Date  . ABLATION  06/29/2013   s/p isthmus ablation for atrial flutter by Dr Rayann Heman  . AORTIC VALVE REPAIR    . ATRIAL FLUTTER ABLATION N/A 06/29/2013   Procedure: ATRIAL FLUTTER  ABLATION;  Surgeon: Thompson Grayer, MD;  Location: [facility] CATH LAB;  Service: Cardiovascular;  Laterality: N/A;  . CARDIAC CATHETERIZATION N/A 06/27/2015   Procedure: Temporary Pacemaker;  Surgeon: Sanda Klein, MD;  Location: [facility] CV LAB;  Service: Cardiovascular;  Laterality: N/A;  . CARDIOVERSION N/A 03/24/2013   Procedure: CARDIOVERSION;  Surgeon: Sanda Klein, MD;  Location: [facility] ENDOSCOPY;  Service: Cardiovascular;  Laterality: N/A;  . COLONOSCOPY WITH PROPOFOL Left 03/08/2020   Procedure: COLONOSCOPY WITH PROPOFOL;  Surgeon: Arta Silence, MD;  Location: [facility];  Service: Endoscopy;  Laterality: Left;  . EP IMPLANTABLE DEVICE N/A 06/27/2015   Procedure: PPM Generator Changeout;  Surgeon: Sanda Klein, MD;  Location: [facility] CV LAB;  Service: Cardiovascular;  Laterality: N/A;  . ESOPHAGOGASTRODUODENOSCOPY (EGD) WITH PROPOFOL Left 03/07/2020   Procedure: ESOPHAGOGASTRODUODENOSCOPY (EGD) WITH PROPOFOL;  Surgeon: Arta Silence, MD;  Location: [facility] ENDOSCOPY;  Service: Endoscopy;  Laterality: Left;  . PACEMAKER INSERTION  2007   Medtronic Adapta ADDRL1, serial # [id number]  . pulmonary valvotomy  1985  . trigeminal sx     left side    Outpatient Medications Prior to Visit  Medication Sig Dispense Refill  . acetaminophen (TYLENOL ARTHRITIS PAIN) 650 MG CR tablet Take 1,300 mg by mouth daily as needed for pain.     [name] Kitchen apixaban (ELIQUIS) 5 MG TABS tablet Take 1 tablet (5 mg total) by mouth 2 (two) times daily. 60 tablet 11  . calcium-vitamin D (OSCAL) 250-125 MG-UNIT per tablet Take 1 tablet by mouth daily.    . candesartan (ATACAND) 8 MG tablet Take 1 tablet (8 mg total) by mouth daily. 90 tablet 1  . clonazePAM (KLONOPIN) 0.5 MG tablet Take 0.25 mg by mouth at bedtime.     . dorzolamide-timolol (COSOPT) 22.3-6.8 MG/ML ophthalmic solution Place 2 drops into both eyes 2 (two) times daily.    [name] Kitchen erythromycin ophthalmic ointment Place 1 application into the left eye at bedtime.    .  folic acid (FOLVITE) 1 MG tablet Take 1 tablet (1 mg total) by mouth daily.    [name] Kitchen latanoprost (XALATAN) 0.005 % ophthalmic solution Place 1 drop into both eyes at bedtime.    . Multiple Vitamins-Minerals (MULTIVITAMIN PO) Take 1 tablet by mouth daily.     . nebivolol (BYSTOLIC) 10 MG tablet Take 1 tablet (10 mg total) by mouth daily. 30 tablet 6  . omeprazole (PRILOSEC) 40 MG capsule Take 1 capsule (40 mg total) by mouth daily. (Patient taking differently: Take 20 mg by mouth daily. ) 90 capsule 3  . rosuvastatin (CRESTOR) 10 MG tablet Take 10 mg by mouth at bedtime.    [name] Kitchen trimethoprim (TRIMPEX) 100 MG tablet Take 100 mg by mouth 2 (two) times daily. For ten days    . venlafaxine XR (EFFEXOR-XR) 75 MG 24 hr capsule Take 75 mg by mouth daily.    . benzonatate (TESSALON) 100 MG capsule Take 1 capsule (100 mg total) by mouth 2 (two) times daily. 21 capsule 0  . furosemide (LASIX) 40  MG tablet TAKE 1 TABLET DAILY. MAY TAKE AN EXTRA DOSE DAILY AS NEEDED FOR WEIGHT GAIN. (Patient taking differently: Take 40 mg by mouth See admin instructions. 40mg  once daily. May take an additional 40mg  once daily as needed for weight gain.) 180 tablet 4  . potassium chloride (K-DUR) 10 MEQ tablet Take 1 tablet (10 mEq total) by mouth daily. 30 tablet 1   Facility-Administered Medications Prior to Visit  Medication Dose Route Frequency Provider Last Rate Last Admin  . triamcinolone acetonide (KENALOG) 10 MG/ML injection 10 mg  10 mg Other Once , DPM         Allergies:   Sulfa antibiotics and Hydrocodone   Social History   Socioeconomic History  . Marital status: Widowed    Spouse name: Not on file  . Number of children: 2  . Years of education: Not on file  . Highest education level: Not on file  Occupational History  . Not on file  Tobacco Use  . Smoking status: Never Smoker  . Smokeless tobacco: Never Used  Vaping Use  . Vaping Use: Never used  Substance and Sexual Activity  . Alcohol use:  No  . Drug use: No  . Sexual activity: Never  Other Topics Concern  . Not on file  Social History Narrative  . Not on file   Social Determinants of Health   Financial Resource Strain:   . Difficulty of Paying Living Expenses:   Food Insecurity:   . Worried About in the Last Year:   . Lenn Sink in the Last Year:   Transportation Needs:   . Programme researcher, broadcasting/film/video (Medical):   Barista Lack of Transportation (Non-Medical):   Physical Activity:   . Days of Exercise per Week:   . Minutes of Exercise per Session:   Stress:   . Feeling of Stress :   Social Connections:   . Frequency of Communication with Friends and Family:   . Frequency of Social Gatherings with Friends and Family:   . Attends Religious Services:   . Active Member of Clubs or Organizations:   . Attends Freight forwarder Meetings:   [name] Kitchen Marital Status:     ROS:   Please see the history of present illness.    ROS All other systems reviewed and are negative.   PHYSICAL EXAM:   VS:  BP 138/80 (BP Location: Right Arm, Patient Position: Sitting, Cuff Size: Normal)   Pulse 76   Ht 5\' 2"  (1.575 m)   Wt 163 lb (73.9 kg)   SpO2 96%   BMI 29.81 kg/m      General: Alert, oriented x3, no distress, no longer pale Head: no evidence of trauma, PERRL, EOMI, no exophtalmos or lid lag, no myxedema, no xanthelasma; normal ears, nose and oropharynx Neck: normal jugular venous pulsations and no hepatojugular reflux; brisk carotid pulses without delay and no carotid bruits Chest: clear to auscultation, no signs of consolidation by percussion or palpation, normal fremitus, symmetrical and full respiratory excursion Cardiovascular: normal position and quality of the apical impulse, regular rhythm, normal first and paradoxically split second heart sounds, 2/6 ejection murmur in the pulmonic focus, 1/6 diastolic decrescendo murmur in the same location, no rubs or gallops Abdomen: no tenderness or distention, no  masses by palpation, no abnormal pulsatility or arterial bruits, normal bowel sounds, no hepatosplenomegaly Extremities: no clubbing, cyanosis or edema; 2+ radial, ulnar and brachial pulses bilaterally; 2+ right femoral, posterior tibial  and dorsalis pedis pulses; 2+ left femoral, posterior tibial and dorsalis pedis pulses; no subclavian or femoral bruits Neurological: grossly nonfocal Psych: Normal mood and affect   Wt Readings from Last 3 Encounters:  06/03/20 163 lb (73.9 kg)  03/18/20 163 lb 9.6 oz (74.2 kg)  03/08/20 166 lb 4.8 oz (75.4 kg)    Studies/Labs Reviewed:   EKG:  EKG is not ordered today.  Intracardiac EGM shows AV sequential pacing  Lipid Panel     Component Value Date/Time   CHOL 132 08/21/2019 1043   TRIG 159 (H) 08/21/2019 1043   HDL 51 08/21/2019 1043   CHOLHDL 2.6 08/21/2019 1043   LDLCALC 54 08/21/2019 1043   BMET    Component Value Date/Time   NA 143 03/18/2020 1523   K 5.0 03/18/2020 1523   K 4.2 09/13/2013 1609   CL 104 03/18/2020 1523   CO2 26 03/18/2020 1523   GLUCOSE 116 (H) 03/18/2020 1523   GLUCOSE 132 (H) 03/08/2020 0813   BUN 11 03/18/2020 1523   CREATININE 1.06 (H) 03/18/2020 1523   CREATININE 1.03 06/25/2015 1012   CALCIUM 9.8 03/18/2020 1523   GFRNONAA 48 (L) 03/18/2020 1523   GFRAA 56 (L) 03/18/2020 1523   ASSESSMENT:    1. Chronic diastolic heart failure (HCC)   2. Paroxysmal atrial fibrillation (HCC)   3. CHB (complete heart block) (HCC)   4. Pacemaker   5. Mixed hyperlipidemia   6. Nonrheumatic pulmonary valve stenosis   7. History of repair of congenital atrial septal defect (ASD)   8. Aneurysm of left pulmonary artery (HCC)   9. Anemia, unspecified type   10. Vitamin D deficiency   11. Screening for thyroid disorder     PLAN:  In order of problems listed above:  1. CHF, diastolic: NYHA class I-II, no signs of hypervolemia. She has borderline reduced LVEF, likely related to RV apical pacing.  No change in diuretic  dose. 2. PAFlutter/ Afib:  None since last visit. Very low burden of arrhythmia (0.1%), no meaningful episodes since November 2020.  CHADSVasc4 (age 80, gender, HF). 3. CHB: Pacemaker dependent. She does not tolerate threshold testing well. 4. PPM: Normal device function.  Continue remote downloads every 3 months. Office visit in 6 months. 5. HLP: Recheck labs today. Will also check TSH and Vit D at PCP request. 6. PS s/p surgical valvotomy: Mild to moderate regurgitation, no residual stenosis by echocardiogram. No change in murmur on exam. 7. ASD s/p patch closure: No signs of residual shunt on echo, no evidence of right heart failure. 8. L PA aneurysm: 6.4 cm.  Plan conservative, non-surgical management.  Serial measurements did not appear to be indicated. 9. Anemia: no longer pale. Check Hgb today. Stop folic acid if normalized.     Medication Adjustments/Labs and Tests Ordered: Current medicines are reviewed at length with the patient today.  Concerns regarding medicines are outlined above.  Medication changes, Labs and Tests ordered today are listed in the Patient Instructions below. Patient Instructions  Medication Instructions:  No changes If you need a refill on your cardiac medications before your next appointment, please call your pharmacy.   Lab work: Your provider would like for you to have the following labs today: CBC, Thyroid panel, Vitamin D and fasting Lipid  If you have labs (blood work) drawn today and your tests are completely normal, you will receive your results only by: MyChart Message (if you have MyChart) OR A paper copy in the mail If  you have any lab test that is abnormal or we need to change your treatment, we will call you to review the results.  Testing/Procedures: None ordered  Follow-Up: At [facility], you and your health needs are our priority.  As part of our continuing mission to provide you with exceptional heart care, we have created designated  Provider Care Teams.  These Care Teams include your primary Cardiologist (physician) and Advanced Practice Providers (APPs -  Physician Assistants and Nurse Practitioners) who all work together to provide you with the care you need, when you need it.  We recommend signing up for the patient portal called "MyChart".  Sign up information is provided on this After Visit Summary.  MyChart is used to connect with patients for Virtual Visits (Telemedicine).  Patients are able to view lab/test results, encounter notes, upcoming appointments, etc.  Non-urgent messages can be sent to your provider as well.   To learn more about what you can do with MyChart, go to [email or web address].    Your next appointment:   6 month(s)  The format for your next appointment:   In Person  Provider:   Thurmon FairMihai Marion Seese, MD   Remote monitoring is used to monitor your pacemaker from home. This monitoring reduces the number of office visits required to check your device to one time per year. It allows us to keep an eye on the functioning of your device to ensure it is working properly. You are scheduled for a device check from home on 08/28/20. You may send your transmission at any time that day. If you have a wireless device, the transmission will be sent automatically.      Signed, Thurmon FairMihai Sheriden Archibeque, MD  06/03/2020 9:43 AM    [facility] [address], [place], [place]  [address] Phone: [phone]; Fax: [phone]

## 2020-06-05 ENCOUNTER — Other Ambulatory Visit: Payer: Self-pay | Admitting: *Deleted

## 2020-06-10 LAB — CBC
Hematocrit: 37.8 % (ref 34.0–46.6)
Hemoglobin: 11.9 g/dL (ref 11.1–15.9)
MCH: 27.7 pg (ref 26.6–33.0)
MCHC: 31.5 g/dL (ref 31.5–35.7)
MCV: 88 fL (ref 79–97)
Platelets: 200 10*3/uL (ref 150–450)
RBC: 4.3 x10E6/uL (ref 3.77–5.28)
RDW: 13.1 % (ref 11.7–15.4)
WBC: 7 10*3/uL (ref 3.4–10.8)

## 2020-06-10 LAB — LIPID PANEL
Chol/HDL Ratio: 2.2 ratio (ref 0.0–4.4)
Cholesterol, Total: 119 mg/dL (ref 100–199)
HDL: 53 mg/dL (ref >39)
LDL Chol Calc (NIH): 45 mg/dL (ref 0–99)
Triglycerides: 119 mg/dL (ref 0–149)
VLDL Cholesterol Cal: 21 mg/dL (ref 5–40)

## 2020-06-10 LAB — VITAMIN D 1,25 DIHYDROXY
Vitamin D 1, 25 (OH)2 Total: 47 pg/mL
Vitamin D2 1, 25 (OH)2: <10 pg/mL
Vitamin D3 1, 25 (OH)2: 47 pg/mL

## 2020-06-10 LAB — THYROID PANEL WITH TSH
Free Thyroxine Index: 1.8 (ref 1.2–4.9)
T3 Uptake Ratio: 25 % (ref 24–39)
T4, Total: 7 ug/dL (ref 4.5–12.0)
TSH: 4.72 u[IU]/mL — ABNORMAL HIGH (ref 0.450–4.500)

## 2020-06-18 ENCOUNTER — Telehealth: Payer: Self-pay | Admitting: Cardiovascular Disease

## 2020-06-18 DIAGNOSIS — R0602 Shortness of breath: Secondary | ICD-10-CM

## 2020-06-18 DIAGNOSIS — I5032 Chronic diastolic (congestive) heart failure: Secondary | ICD-10-CM

## 2020-06-18 MED ORDER — RIVAROXABAN 15 MG PO TABS: 15.0000 mg | ORAL_TABLET | Freq: Every day | ORAL | 6 refills | Status: DC

## 2020-06-18 NOTE — Telephone Encounter (Signed)
Pt c/o medication issue:  1. Name of Medication: apixaban (ELIQUIS) 5 MG TABS tablet  2. How are you currently taking this medication (dosage and times per day)? As directed   3. Are you having a reaction (difficulty breathing--STAT)? Yes - more winded than normal.   4. What is your medication issue? More SOB and face is swelling. States she is fine once she sits down. She does notice more SOB on this medication rather than with Xarelto.

## 2020-06-18 NOTE — Telephone Encounter (Signed)
Spoke to patient's daughter Lanice Schwab Dr.Croitoru's advice given.Stated she will restart Xarelto 15 mg with supper tomorrow.She will call back if she continues to have sob.

## 2020-06-18 NOTE — Telephone Encounter (Signed)
I suspect there is no connection, but I am perfectly willing to try for a month to see what happens.

## 2020-06-18 NOTE — Telephone Encounter (Signed)
Spoke to patient's daughter [place].She wanted to let Dr.Croitoru know since her mother changed to Eliquis she has noticed she is sob.Weight stable.Swelling in ankles no worse.She complains of swelling in face.She wanted to ask can she go back to Xarelto to see if sob improves.Advised I will send message to Dr.Croitoru for advice.

## 2020-07-01 ENCOUNTER — Encounter: Payer: Medicare Other | Admitting: Cardiovascular Disease

## 2020-07-23 NOTE — Telephone Encounter (Signed)
Returned call to patient's daughter [name] Baird she stated mother got better for about 2 weeks.She is sob with exertion.Stated she gets sob just walking around in house.She is having pain in muscles and joints.She weighs 162 to 163 lbs.Swelling in both lower legs no worse.She is not sleeping well at night.Stated her TSH is elevated but PCP did not do anything.She wanted to ask Dr.Croiitoru his advice.

## 2020-07-23 NOTE — Telephone Encounter (Signed)
Spoke to patient's daughter [place].Dr.Croitoru's advice and recommendations given.She will do pacemaker download this afternoon.She will have labs and cxr tomorrow 8/4.

## 2020-07-23 NOTE — Telephone Encounter (Signed)
Patient's daughter calling back stating her SOB has not improved. She states the patient feels bloated and her weight has not changed.

## 2020-07-23 NOTE — Telephone Encounter (Signed)
I doubt this is heart failure since she weighs the same as she did at her June appointment, but we can check further  with a BNP level and a PA and lateral CXR (for dyspnea). Also check a CBC to make sure she is not anemic. Please order those. Ask her to also do a pacemaker download to look for AFib. TSH was barely abnormal and the other thyroid labs were OK - I agree with PCP: there is no need to treat the abnormal TSH any differently.

## 2020-07-23 NOTE — Addendum Note (Signed)
Addended by: Neoma Laming on: 07/23/2020 02:20 PM   Modules accepted: Orders

## 2020-07-24 ENCOUNTER — Ambulatory Visit
Admission: RE | Admit: 2020-07-24 | Discharge: 2020-07-24 | Disposition: A | Discharge status: Home / Self Care | Payer: Medicare Other | Source: Ambulatory Visit | Attending: Cardiovascular Disease | Admitting: Cardiovascular Disease

## 2020-07-24 DIAGNOSIS — I5032 Chronic diastolic (congestive) heart failure: Secondary | ICD-10-CM

## 2020-07-24 DIAGNOSIS — R0602 Shortness of breath: Secondary | ICD-10-CM

## 2020-07-25 LAB — CBC WITH DIFFERENTIAL/PLATELET
Basophils Absolute: 0 10*3/uL (ref 0.0–0.2)
Basos: 0 %
EOS (ABSOLUTE): 0.1 10*3/uL (ref 0.0–0.4)
Eos: 2 %
Hematocrit: 37.8 % (ref 34.0–46.6)
Hemoglobin: 11.5 g/dL (ref 11.1–15.9)
Immature Grans (Abs): 0 10*3/uL (ref 0.0–0.1)
Immature Granulocytes: 0 %
Lymphocytes Absolute: 1.1 10*3/uL (ref 0.7–3.1)
Lymphs: 16 %
MCH: 26.3 pg — ABNORMAL LOW (ref 26.6–33.0)
MCHC: 30.4 g/dL — ABNORMAL LOW (ref 31.5–35.7)
MCV: 86 fL (ref 79–97)
Monocytes Absolute: 0.8 10*3/uL (ref 0.1–0.9)
Monocytes: 12 %
Neutrophils Absolute: 4.8 10*3/uL (ref 1.4–7.0)
Neutrophils: 70 %
Platelets: 216 10*3/uL (ref 150–450)
RBC: 4.38 x10E6/uL (ref 3.77–5.28)
RDW: 14.9 % (ref 11.7–15.4)
WBC: 6.9 10*3/uL (ref 3.4–10.8)

## 2020-07-25 LAB — BRAIN NATRIURETIC PEPTIDE: BNP: 411.9 pg/mL — ABNORMAL HIGH (ref 0.0–100.0)

## 2020-07-30 ENCOUNTER — Other Ambulatory Visit: Payer: Self-pay | Admitting: *Deleted

## 2020-07-30 MED ORDER — POTASSIUM CHLORIDE CRYS ER 10 MEQ PO TBCR: EXTENDED_RELEASE_TABLET | ORAL | 5 refills | Status: DC

## 2020-07-30 MED ORDER — FUROSEMIDE 40 MG PO TABS: ORAL_TABLET | ORAL | 5 refills | Status: DC

## 2020-08-15 ENCOUNTER — Other Ambulatory Visit: Payer: Self-pay | Admitting: Cardiovascular Disease

## 2020-08-28 ENCOUNTER — Ambulatory Visit (INDEPENDENT_AMBULATORY_CARE_PROVIDER_SITE_OTHER): Payer: Medicare Other | Admitting: *Deleted

## 2020-08-28 DIAGNOSIS — I495 Sick sinus syndrome: Secondary | ICD-10-CM | POA: Diagnosis not present

## 2020-08-28 LAB — CUP PACEART REMOTE DEVICE CHECK
Battery Impedance: 628 Ohm
Battery Remaining Longevity: 65 mo
Battery Voltage: 2.77 V
Brady Statistic AP VP Percent: 93 %
Brady Statistic AP VS Percent: 0 %
Brady Statistic AS VP Percent: 7 %
Brady Statistic AS VS Percent: 0 %
Date Time Interrogation Session: 20210908065809
Implantable Lead Implant Date: 20070920
Implantable Lead Implant Date: 20070920
Implantable Lead Location: 753859
Implantable Lead Location: 753860
Implantable Lead Model: 4592
Implantable Lead Model: 5092
Implantable Pulse Generator Implant Date: 20160707
Lead Channel Impedance Value: 495 Ohm
Lead Channel Impedance Value: 749 Ohm
Lead Channel Pacing Threshold Amplitude: 0.625 V
Lead Channel Pacing Threshold Amplitude: 1.375 V
Lead Channel Pacing Threshold Pulse Width: 0.4 ms
Lead Channel Pacing Threshold Pulse Width: 0.4 ms
Lead Channel Setting Pacing Amplitude: 2 V
Lead Channel Setting Pacing Amplitude: 2.75 V
Lead Channel Setting Pacing Pulse Width: 0.4 ms
Lead Channel Setting Sensing Sensitivity: 5.6 mV

## 2020-08-29 ENCOUNTER — Other Ambulatory Visit: Payer: Self-pay | Admitting: Cardiovascular Disease

## 2020-08-29 NOTE — Progress Notes (Signed)
Remote pacemaker transmission.   

## 2020-10-01 ENCOUNTER — Telehealth: Payer: Self-pay | Admitting: Cardiovascular Disease

## 2020-10-01 NOTE — Telephone Encounter (Signed)
Called patient's daughter back (DPR) about her message. Patient has been having SOB that has been getting worse with activity. Patient also complaining about her BP being elevated. Patient is not complaining of any swelling, but has like a tight feeling in her ribcage. Patient is taking Lasix 40 mg 4 days a week, and on the rest of the days she is taking 80 mg. On days she is taking 80 mg her weight goes down, but daughter did not see any difference in her SOB on those days. Made patient an appointment with APP on 10/03/20 to be evaluated. Will send message to Dr. Royann Shivers for further advisement.

## 2020-10-01 NOTE — Telephone Encounter (Signed)
Called patient's daughter back about Dr. Erin Hearing message. Patient will take a total of 80 mg of Lasix daily between now and 10/03/20. Put note in appointments to have patient check BNP and BMET. Patient's daughter verbalized understanding.

## 2020-10-01 NOTE — Telephone Encounter (Signed)
  [name], daughter of the patient called. The daughter states the patient is having some increased SOB but her weight is down to 161.2 this morning. Her BP has been elevated  Pt c/o BP issue: STAT if pt c/o blurred vision, one-sided weakness or slurred speech  1. What are your last 5 BP readings?  09/29/20: 149/86 after 1 fluid pill 09/30/20: 133/91 after 2 fluid pills  2. Are you having any other symptoms (ex. Dizziness, headache, blurred vision, passed out)? Increased SOB  3. What is your BP issue? Higher than normal BP   Pt c/o Shortness Of Breath: STAT if SOB developed within the last 24 hours or pt is noticeably SOB on the phone  1. Are you currently SOB (can you hear that pt is SOB on the phone)? Daughter called. She is not with the patient  2. How long have you been experiencing SOB? A few days, but it was the worst today   3. Are you SOB when sitting or when up moving around? Up and moving around  4. Are you currently experiencing any other symptoms? Like a tight feeling in her ribcage  Daughter is not sure what Dr. Salena Saner wanted to do. The patient just asked the daughter to call and get Dr. Renaye Rakers advice and suggestions

## 2020-10-01 NOTE — Telephone Encounter (Signed)
Please take furosemide 80 mg daily every day between now and 10/14 appt and we can recheck BMET and BNP on that day, please.

## 2020-10-02 NOTE — Progress Notes (Signed)
Cardiology Office Note   Date:  10/03/2020   ID:  [name] Baird, DOB [birth year], MRN [id number]  PCP:  [name] Lor, NP  Cardiologist:  Thurmon Fair, MD EP: None  Chief Complaint  Patient presents with  . Shortness of Breath      History of Present Illness: [name] Baird is a 86 y.o. female with a PMH of congenital heart disease (s/p ASD repair with patch closure, PS s/p valvotomy 1985), left pulmonary artery aneurysm, CHB s/p PPM, paroxysmal atrial flutter/fibrillation (s/p Aflutter ablation 2014), chronic combined CHF, GIB, who presents for follow-up of her SOB.  She was last evaluated by cardiology at an outpatient visit with Dr. Royann Shivers 05/2020, at which time she was doing fine from a cardiac standpoint and noted to have rare episodes of acute on chronic diastolic CHF. No medication changes occurred at that visit. She was recommended to follow-up in 6 months. Her last echocardiogram in 2019 showed EF 45-50%, diffuse hypokinesis, mild LVH, G1DD, severe LAE, moderate AI, moderate MR, moderate TR, and moderate PR without evidence of stenosis. Her last device interrogation showed CHB with pacer dependency, rare PAT, very rare NSVT, and no atrial fibrillation.   Her daughter contacted our office 10/01/20 to report concerns for increased SOB and elevated blood pressure readings. She was recommended to increase her lasix from 40mg  4x per week alternating with 80mg  3x per week to 80mg  daily until seen in the office for follow-up.   She presents today with her daughter for follow-up of her DOE. She reports some improvement in her DOE since increasing her lasix. Blood pressures have also improved. She noted some abdominal bloating/fullness recently, particularly after a meal, though feels back to normal today. Daughter reports they try to limit salt intake but she did have a [employer] cheeseburger on Sunday with associated weight gain of 2lbs overnight and correlated onset of DOE.  Daughter reports that [name] appears more weak and has increased confusion on days when she takes lasix 80mg  daily. We will check labs today to monitor electrolytes. LE edema has been stable and is gone upon waking in the mornings. No complaints of chest pain/pressure/tightness. No complaints of fever, orthopnea, PND, dizziness, lightheadedness, palpitations, or syncope.    Past Medical History:  Diagnosis Date  . Anxiety   . Atrial flutter (HCC)    s/p ablation 06/29/2013 by Dr  . CHF (congestive heart failure) (HCC)   . Complete heart block (HCC) 2007   s/p medtronic pacemaker implantation  . Depression   . Glaucoma   . Hypertension   . Pulmonary stenosis sp valvotomy       . Shortness of breath   . Status post patch closure of ASD     Past Surgical History:  Procedure Laterality Date  . ABLATION  06/29/2013   s/p isthmus ablation for atrial flutter by Dr 08/30/2013  . AORTIC VALVE REPAIR    . ATRIAL FLUTTER ABLATION N/A 06/29/2013   Procedure: ATRIAL FLUTTER ABLATION;  Surgeon: 2008, MD;  Location: [facility] CATH LAB;  Service: Cardiovascular;  Laterality: N/A;  . CARDIAC CATHETERIZATION N/A 06/27/2015   Procedure: Temporary Pacemaker;  Surgeon: 08/30/2013, MD;  Location: [facility] INVASIVE CV LAB;  Service: Cardiovascular;  Laterality: N/A;  . CARDIOVERSION N/A 03/24/2013   Procedure: CARDIOVERSION;  Surgeon: [facility], MD;  Location: [facility] ENDOSCOPY;  Service: Cardiovascular;  Laterality: N/A;  . COLONOSCOPY WITH PROPOFOL Left 03/08/2020   Procedure: COLONOSCOPY WITH PROPOFOL;  Surgeon: Thurmon Fair,  Chrissie Noa, MD;  Location: [facility] ENDOSCOPY;  Service: Endoscopy;  Laterality: Left;  . EP IMPLANTABLE DEVICE N/A 06/27/2015   Procedure: PPM Generator Changeout;  Surgeon: Thurmon Fair, MD;  Location: [facility] INVASIVE CV LAB;  Service: Cardiovascular;  Laterality: N/A;  . ESOPHAGOGASTRODUODENOSCOPY (EGD) WITH PROPOFOL Left 03/07/2020   Procedure: ESOPHAGOGASTRODUODENOSCOPY (EGD) WITH PROPOFOL;   Surgeon: Willis Modena, MD;  Location: [facility] ENDOSCOPY;  Service: Endoscopy;  Laterality: Left;  . PACEMAKER INSERTION  2007   Medtronic Adapta ADDRL1, serial # [id number]  . pulmonary valvotomy  1985  . trigeminal sx     left side     Current Outpatient Medications  Medication Sig Dispense Refill  . acetaminophen (TYLENOL ARTHRITIS PAIN) 650 MG CR tablet Take 1,300 mg by mouth daily as needed for pain.     . bacitracin ophthalmic ointment     . BYSTOLIC 10 MG tablet TAKE 1 TABLET BY MOUTH EVERY DAY 90 tablet 3  . calcium-vitamin D (OSCAL) 250-125 MG-UNIT per tablet Take 1 tablet by mouth daily.    . candesartan (ATACAND) 8 MG tablet TAKE 1 TABLET BY MOUTH EVERY DAY 90 tablet 3  . clonazePAM (KLONOPIN) 0.5 MG tablet Take 0.25 mg by mouth at bedtime.     . dorzolamide-timolol (COSOPT) 22.3-6.8 MG/ML ophthalmic solution Place 2 drops into both eyes 2 (two) times daily.    . furosemide (LASIX) 40 MG tablet TAKE 80MG  x3 DAYS THEN BACK TO one tablet (40 mg) on Tues, Thurs, Sat and Sunday. Take 2 tablets (80 mg) on Mon, Wed and Friday 50 tablet 5  . latanoprost (XALATAN) 0.005 % ophthalmic solution Place 1 drop into both eyes at bedtime.    . Multiple Vitamins-Minerals (MULTIVITAMIN PO) Take 1 tablet by mouth daily.     Sunday omeprazole (PRILOSEC) 40 MG capsule Take 1 capsule (40 mg total) by mouth daily. (Patient taking differently: Take 20 mg by mouth daily. ) 90 capsule 3  . potassium chloride (KLOR-CON) 10 MEQ tablet Take one tablet (10 mEq) on Tues, Thurs, Sat and Sunday. Take 2 tablets (20 mEq) on Mon, Wed and Friday 40 tablet 5  . RHOPRESSA 0.02 % SOLN     . Rivaroxaban (XARELTO) 15 MG TABS tablet Take 1 tablet (15 mg total) by mouth daily with supper. 30 tablet 6  . rosuvastatin (CRESTOR) 10 MG tablet Take 10 mg by mouth at bedtime.    Monday venlafaxine XR (EFFEXOR-XR) 75 MG 24 hr capsule Take 75 mg by mouth daily.     No current facility-administered medications for this visit.    Allergies:    Sulfa antibiotics and Hydrocodone    Social History:  The patient  reports that she has never smoked. She has never used smokeless tobacco. She reports that she does not drink alcohol and does not use drugs.   Family History:  The patient's family history includes Heart failure in her mother; Multiple sclerosis in her daughter; Stroke in her father.    ROS:  Please see the history of present illness.   Otherwise, review of systems are positive for none.   All other systems are reviewed and negative.    PHYSICAL EXAM: VS:  BP 127/70   Pulse 78   Ht 5\' 2"  (1.575 m)   Wt 163 lb (73.9 kg)   SpO2 96%   BMI 29.81 kg/m  , BMI Body mass index is 29.81 kg/m. GEN: Well nourished, well developed, in no acute distress HEENT: sclera anicteric; left eye blindness Neck: no  JVD, carotid bruits, or masses Cardiac: RRR; no murmurs, rubs, or gallops, no edema  Respiratory:  clear to auscultation bilaterally, normal work of breathing GI: soft, nontender, nondistended, + BS MS: no deformity or atrophy Skin: warm and dry, no rash Neuro:  Strength and sensation are intact Psych: euthymic mood, full affect   EKG:  EKG is ordered today. The ekg ordered today demonstrates AV paced rhythm with rate 78 bpm.    Recent Labs: 03/08/2020: ALT 15 03/18/2020: BUN 11; Creatinine, Ser 1.06; Potassium 5.0; Sodium 143 06/03/2020: TSH 4.720 07/24/2020: BNP 411.9; Hemoglobin 11.5; Platelets 216    Lipid Panel    Component Value Date/Time   CHOL 119 06/03/2020 0918   TRIG 119 06/03/2020 0918   HDL 53 06/03/2020 0918   CHOLHDL 2.2 06/03/2020 0918   LDLCALC 45 06/03/2020 0918      Wt Readings from Last 3 Encounters:  10/03/20 163 lb (73.9 kg)  06/03/20 163 lb (73.9 kg)  03/18/20 163 lb 9.6 oz (74.2 kg)      Other studies Reviewed: Additional studies/ records that were reviewed today include:   Echo in care everywhere from 2019 reviewed.     ASSESSMENT AND PLAN:  1. Acute on chronic combined  CHF: worsening DOE for several days. Increase lasix to 80mg  daily for the past 2 days with improvement in symptoms. We discussed the importance of limiting salt intake and will provide information to help guide them at home.  - Will check a BMET and BNP today - Continue lasix 80mg  daily today, then resume regular 80mg  M/W/F alternating with 40mg  lasix - Will discuss utility of repeating an echocardiogram with Dr. - not sure this would change management.   2. Paroxysmal atrial fibrillation/flutter: none noted on device interrogation 1 month ago. No complaints of bleeding - Continue bystolic for rate control - Continue xarelto for stroke ppx  3. HTN: BP 127/70 today - Continue bystolic, candesartan, and lasix  4. CHB s/p PPM: she is ppm dependent. No significant arrhythmias on last device interrogation - Continue routine monitoring per Dr.  5. Congenital heart disease: Hx of ASD s/p patch closure and PS s/p surgical valvotomy. Stable on last echo in 2019  6. Pulmonary artery aneurysm: noted to be 6.4cm on prior imaging. Seen by CT Surgery at [facility] who recommended no intervention and ongoing medical management.   7. HLD: LDL 45 05/2020 - Continue crestor    Current medicines are reviewed at length with the patient today.  The patient does not have concerns regarding medicines.  The following changes have been made:  As above  Labs/ tests ordered today include:   Orders Placed This Encounter  Procedures  . Basic metabolic panel  . Brain natriuretic peptide  . EKG 12-Lead     Disposition:   FU with Dr. Royann Shivers as scheduled 11/2020  Signed, [facility], PA-C  10/03/2020 10:25 AM

## 2020-10-03 ENCOUNTER — Encounter: Payer: Self-pay | Admitting: Medical

## 2020-10-03 ENCOUNTER — Other Ambulatory Visit: Payer: Self-pay

## 2020-10-03 ENCOUNTER — Ambulatory Visit (INDEPENDENT_AMBULATORY_CARE_PROVIDER_SITE_OTHER): Payer: Medicare Other | Admitting: Medical

## 2020-10-03 VITALS — BP 127/70 | HR 78 | Ht 62.0 in | Wt 163.0 lb

## 2020-10-03 DIAGNOSIS — I5043 Acute on chronic combined systolic (congestive) and diastolic (congestive) heart failure: Secondary | ICD-10-CM | POA: Diagnosis not present

## 2020-10-03 DIAGNOSIS — I442 Atrioventricular block, complete: Secondary | ICD-10-CM

## 2020-10-03 DIAGNOSIS — I480 Paroxysmal atrial fibrillation: Secondary | ICD-10-CM

## 2020-10-03 DIAGNOSIS — Z950 Presence of cardiac pacemaker: Secondary | ICD-10-CM

## 2020-10-03 DIAGNOSIS — I10 Essential (primary) hypertension: Secondary | ICD-10-CM | POA: Diagnosis not present

## 2020-10-03 DIAGNOSIS — I281 Aneurysm of pulmonary artery: Secondary | ICD-10-CM

## 2020-10-03 DIAGNOSIS — Z8774 Personal history of (corrected) congenital malformations of heart and circulatory system: Secondary | ICD-10-CM

## 2020-10-03 DIAGNOSIS — I370 Nonrheumatic pulmonary valve stenosis: Secondary | ICD-10-CM

## 2020-10-03 DIAGNOSIS — E7800 Pure hypercholesterolemia, unspecified: Secondary | ICD-10-CM

## 2020-10-03 DIAGNOSIS — Z79899 Other long term (current) drug therapy: Secondary | ICD-10-CM

## 2020-10-03 MED ORDER — FUROSEMIDE 40 MG PO TABS: ORAL_TABLET | ORAL | 5 refills | Status: DC

## 2020-10-03 NOTE — Progress Notes (Signed)
Thanks , Dot Lanes. Go ahead and order the echo, please.

## 2020-10-03 NOTE — Addendum Note (Signed)
Addended by: Alyson Ingles on: 10/03/2020 02:13 PM   Modules accepted: Orders

## 2020-10-03 NOTE — Patient Instructions (Addendum)
Medication Instructions:  TAKE YOUR FUROSEMIDE 80MG  THURS, Friday THEN THEN BACK TO one tablet (40 mg) on Tues, Thurs, Sat and Sunday. Take 2 tablets (80 mg) on Mon, Wed and Friday *If you need a refill on your cardiac medications before your next appointment, please call your pharmacy*  Lab Work: BNP AND BMP TODAY If you have labs (blood work) drawn today and your tests are completely normal, you will receive your results only by:  MyChart Message (if you have MyChart) OR A paper copy in the mail.  If you have any lab test that is abnormal or we need to change your treatment, we will call you to review the results. You may go to any [facility] that is convenient for you however, we do have a lab in our office that is able to assist you. You DO NOT need an appointment for our lab. The lab is open 8:00am and closes at 4:00pm. Lunch 12:45 - 1:45pm.  Special Instructions FLUID <64 OZ DAILY  PLEASE READ AND FOLLOW SALTY 6-ATTACHED  PLEASE FOLLOW LOW SALT DIET-ATTACHED  Follow-Up: Your next appointment:  KEEP SCHEDULED APPOINTMENT  In Person with Monday, MD  At [facility], you and your health needs are our priority.  As part of our continuing mission to provide you with exceptional heart care, we have created designated Provider Care Teams.  These Care Teams include your primary Cardiologist (physician) and Advanced Practice Providers (APPs -  Physician Assistants and Nurse Practitioners) who all work together to provide you with the care you need, when you need it.       Low-Sodium Eating Plan Sodium, which is an element that makes up salt, helps you maintain a healthy balance of fluids in your body. Too much sodium can increase your blood pressure and cause fluid and waste to be held in your body. Your health care provider or dietitian may recommend following this plan if you have high blood pressure (hypertension), kidney disease, liver disease, or heart failure. Eating less sodium  can help lower your blood pressure, reduce swelling, and protect your heart, liver, and kidneys. What are tips for following this plan? General guidelines  Most people on this plan should limit their sodium intake to 1,500-2,000 mg (milligrams) of sodium each day. Reading food labels   The Nutrition Facts label lists the amount of sodium in one serving of the food. If you eat more than one serving, you must multiply the listed amount of sodium by the number of servings.  Choose foods with less than 140 mg of sodium per serving.  Avoid foods with 300 mg of sodium or more per serving. Shopping  Look for lower-sodium products, often labeled as "low-sodium" or "no salt added."  Always check the sodium content even if foods are labeled as "unsalted" or "no salt added".  Buy fresh foods. ? Avoid canned foods and premade or frozen meals. ? Avoid canned, cured, or processed meats  Buy breads that have less than 80 mg of sodium per slice. Cooking  Eat more home-cooked food and less restaurant, buffet, and fast food.  Avoid adding salt when cooking. Use salt-free seasonings or herbs instead of table salt or sea salt. Check with your health care provider or pharmacist before using salt substitutes.  Cook with plant-based oils, such as canola, sunflower, or olive oil. Meal planning  When eating at a restaurant, ask that your food be prepared with less salt or no salt, if possible.  Avoid foods that  contain MSG (monosodium glutamate). MSG is sometimes added to [place] food, bouillon, and some canned foods. What foods are recommended? The items listed may not be a complete list. Talk with your dietitian about what dietary choices are best for you. Grains Low-sodium cereals, including oats, puffed wheat and rice, and shredded wheat. Low-sodium crackers. Unsalted rice. Unsalted pasta. Low-sodium bread. Whole-grain breads and whole-grain pasta. Vegetables Fresh or frozen vegetables. "No salt  added" canned vegetables. "No salt added" tomato sauce and paste. Low-sodium or reduced-sodium tomato and vegetable juice. Fruits Fresh, frozen, or canned fruit. Fruit juice. Meats and other protein foods Fresh or frozen (no salt added) meat, poultry, seafood, and fish. Low-sodium canned tuna and salmon. Unsalted nuts. Dried peas, beans, and lentils without added salt. Unsalted canned beans. Eggs. Unsalted nut butters. Dairy Milk. Soy milk. Cheese that is naturally low in sodium, such as ricotta cheese, fresh mozzarella, or Swiss cheese Low-sodium or reduced-sodium cheese. Cream cheese. Yogurt. Fats and oils Unsalted butter. Unsalted margarine with no trans fat. Vegetable oils such as canola or olive oils. Seasonings and other foods Fresh and dried herbs and spices. Salt-free seasonings. Low-sodium mustard and ketchup. Sodium-free salad dressing. Sodium-free light mayonnaise. Fresh or refrigerated horseradish. Lemon juice. Vinegar. Homemade, reduced-sodium, or low-sodium soups. Unsalted popcorn and pretzels. Low-salt or salt-free chips. What foods are not recommended? The items listed may not be a complete list. Talk with your dietitian about what dietary choices are best for you. Grains Instant hot cereals. Bread stuffing, pancake, and biscuit mixes. Croutons. Seasoned rice or pasta mixes. Noodle soup cups. Boxed or frozen macaroni and cheese. Regular salted crackers. Self-rising flour. Vegetables Sauerkraut, pickled vegetables, and relishes. Olives. [place] fries. Onion rings. Regular canned vegetables (not low-sodium or reduced-sodium). Regular canned tomato sauce and paste (not low-sodium or reduced-sodium). Regular tomato and vegetable juice (not low-sodium or reduced-sodium). Frozen vegetables in sauces. Meats and other protein foods Meat or fish that is salted, canned, smoked, spiced, or pickled. Bacon, ham, sausage, hotdogs, corned beef, chipped beef, packaged lunch meats, salt pork, jerky,  pickled herring, anchovies, regular canned tuna, sardines, salted nuts. Dairy Processed cheese and cheese spreads. Cheese curds. Blue cheese. Feta cheese. String cheese. Regular cottage cheese. Buttermilk. Canned milk. Fats and oils Salted butter. Regular margarine. Ghee. Bacon fat. Seasonings and other foods Onion salt, garlic salt, seasoned salt, table salt, and sea salt. Canned and packaged gravies. Worcestershire sauce. Tartar sauce. Barbecue sauce. Teriyaki sauce. Soy sauce, including reduced-sodium. Steak sauce. Fish sauce. Oyster sauce. Cocktail sauce. Horseradish that you find on the shelf. Regular ketchup and mustard. Meat flavorings and tenderizers. Bouillon cubes. Hot sauce and Tabasco sauce. Premade or packaged marinades. Premade or packaged taco seasonings. Relishes. Regular salad dressings. Salsa. Potato and tortilla chips. Corn chips and puffs. Salted popcorn and pretzels. Canned or dried soups. Pizza. Frozen entrees and pot pies. Summary  Eating less sodium can help lower your blood pressure, reduce swelling, and protect your heart, liver, and kidneys.  Most people on this plan should limit their sodium intake to 1,500-2,000 mg (milligrams) of sodium each day.  Canned, boxed, and frozen foods are high in sodium. Restaurant foods, fast foods, and pizza are also very high in sodium. You also get sodium by adding salt to food.  Try to cook at home, eat more fresh fruits and vegetables, and eat less fast food, canned, processed, or prepared foods. This information is not intended to replace advice given to you by your health care provider. Make sure you  discuss any questions you have with your health care provider. Document Revised: 11/19/2017 Document Reviewed: 11/30/2016 Elsevier Patient Education  2020 [employer].

## 2020-10-04 LAB — BASIC METABOLIC PANEL
BUN/Creatinine Ratio: 15 (ref 12–28)
BUN: 17 mg/dL (ref 8–27)
CO2: 24 mmol/L (ref 20–29)
Calcium: 10.2 mg/dL (ref 8.7–10.3)
Chloride: 101 mmol/L (ref 96–106)
Creatinine, Ser: 1.13 mg/dL — ABNORMAL HIGH (ref 0.57–1.00)
GFR calc Af Amer: 51 mL/min/{1.73_m2} — ABNORMAL LOW (ref >59)
GFR calc non Af Amer: 44 mL/min/{1.73_m2} — ABNORMAL LOW (ref >59)
Glucose: 110 mg/dL — ABNORMAL HIGH (ref 65–99)
Potassium: 5.2 mmol/L (ref 3.5–5.2)
Sodium: 139 mmol/L (ref 134–144)

## 2020-10-04 LAB — BRAIN NATRIURETIC PEPTIDE: BNP: 369.1 pg/mL — ABNORMAL HIGH (ref 0.0–100.0)

## 2020-10-24 ENCOUNTER — Ambulatory Visit (HOSPITAL_COMMUNITY): Payer: Medicare Other | Attending: Cardiology

## 2020-10-24 ENCOUNTER — Other Ambulatory Visit: Payer: Self-pay

## 2020-10-24 DIAGNOSIS — I5043 Acute on chronic combined systolic (congestive) and diastolic (congestive) heart failure: Secondary | ICD-10-CM | POA: Diagnosis present

## 2020-10-24 LAB — ECHOCARDIOGRAM COMPLETE
Area-P 1/2: 4.89 cm2
P 1/2 time: 324 msec
S' Lateral: 3.3 cm

## 2020-10-24 MED ORDER — PERFLUTREN LIPID MICROSPHERE
1.0000 mL | INTRAVENOUS | Status: AC | PRN
  Administered 2020-10-24: 1 mL via INTRAVENOUS

## 2020-11-27 ENCOUNTER — Ambulatory Visit (INDEPENDENT_AMBULATORY_CARE_PROVIDER_SITE_OTHER): Payer: Medicare Other

## 2020-11-27 DIAGNOSIS — I480 Paroxysmal atrial fibrillation: Secondary | ICD-10-CM

## 2020-11-27 DIAGNOSIS — I495 Sick sinus syndrome: Secondary | ICD-10-CM

## 2020-11-28 LAB — CUP PACEART REMOTE DEVICE CHECK
Battery Impedance: 704 Ohm
Battery Remaining Longevity: 61 mo
Battery Voltage: 2.77 V
Brady Statistic AP VP Percent: 93 %
Brady Statistic AP VS Percent: 0 %
Brady Statistic AS VP Percent: 6 %
Brady Statistic AS VS Percent: 0 %
Date Time Interrogation Session: 20211208070207
Implantable Lead Implant Date: 20070920
Implantable Lead Implant Date: 20070920
Implantable Lead Location: 753859
Implantable Lead Location: 753860
Implantable Lead Model: 4592
Implantable Lead Model: 5092
Implantable Pulse Generator Implant Date: 20160707
Lead Channel Impedance Value: 487 Ohm
Lead Channel Impedance Value: 726 Ohm
Lead Channel Pacing Threshold Amplitude: 0.625 V
Lead Channel Pacing Threshold Amplitude: 1.375 V
Lead Channel Pacing Threshold Pulse Width: 0.4 ms
Lead Channel Pacing Threshold Pulse Width: 0.4 ms
Lead Channel Setting Pacing Amplitude: 2 V
Lead Channel Setting Pacing Amplitude: 2.75 V
Lead Channel Setting Pacing Pulse Width: 0.4 ms
Lead Channel Setting Sensing Sensitivity: 5.6 mV

## 2020-12-09 NOTE — Progress Notes (Signed)
Remote pacemaker transmission.   

## 2020-12-15 ENCOUNTER — Other Ambulatory Visit: Payer: Self-pay | Admitting: Cardiovascular Disease

## 2020-12-16 ENCOUNTER — Encounter: Payer: Self-pay | Admitting: Cardiovascular Disease

## 2020-12-16 ENCOUNTER — Other Ambulatory Visit: Payer: Self-pay

## 2020-12-16 ENCOUNTER — Ambulatory Visit (INDEPENDENT_AMBULATORY_CARE_PROVIDER_SITE_OTHER): Payer: Medicare Other | Admitting: Cardiovascular Disease

## 2020-12-16 VITALS — BP 140/79 | HR 96 | Ht 62.5 in | Wt 165.0 lb

## 2020-12-16 DIAGNOSIS — Z8774 Personal history of (corrected) congenital malformations of heart and circulatory system: Secondary | ICD-10-CM

## 2020-12-16 DIAGNOSIS — I442 Atrioventricular block, complete: Secondary | ICD-10-CM | POA: Diagnosis not present

## 2020-12-16 DIAGNOSIS — Q221 Congenital pulmonary valve stenosis: Secondary | ICD-10-CM

## 2020-12-16 DIAGNOSIS — I5032 Chronic diastolic (congestive) heart failure: Secondary | ICD-10-CM | POA: Diagnosis not present

## 2020-12-16 DIAGNOSIS — Z950 Presence of cardiac pacemaker: Secondary | ICD-10-CM | POA: Diagnosis not present

## 2020-12-16 DIAGNOSIS — I281 Aneurysm of pulmonary artery: Secondary | ICD-10-CM

## 2020-12-16 DIAGNOSIS — D649 Anemia, unspecified: Secondary | ICD-10-CM

## 2020-12-16 DIAGNOSIS — I480 Paroxysmal atrial fibrillation: Secondary | ICD-10-CM

## 2020-12-16 DIAGNOSIS — E7800 Pure hypercholesterolemia, unspecified: Secondary | ICD-10-CM

## 2020-12-16 DIAGNOSIS — Z7901 Long term (current) use of anticoagulants: Secondary | ICD-10-CM

## 2020-12-16 MED ORDER — POTASSIUM CHLORIDE CRYS ER 10 MEQ PO TBCR: 20.0000 meq | EXTENDED_RELEASE_TABLET | Freq: Two times a day (BID) | ORAL | 11 refills | Status: DC

## 2020-12-16 MED ORDER — FUROSEMIDE 40 MG PO TABS: 40.0000 mg | ORAL_TABLET | Freq: Two times a day (BID) | ORAL | 11 refills | Status: DC

## 2020-12-16 NOTE — Progress Notes (Signed)
ROS  Patient ID: [name] Baird, female   DOB: 1933/02/28, 86 y.o.   MRN: [id number]    Cardiology Office Note    Date:  12/17/2020   ID:  [name] Baird, DOB Dec 26, 1932, MRN [id number]  PCP:  Marletta Lor, NP  Cardiologist:   Thurmon Fair, MD   Chief Complaint  Patient presents with  . Congestive Heart Failure          History of Present Illness:  [name] Baird is a 86 y.o. female with a history of congenital heart disease (atrial septal defect status post patch closure), pulmonic stenosis (status post valvotomy 1985), left pulmonary artery aneurysm, complete heart block status post dual-chamber permanent pacemaker implantation (Medtronic, last generator change 2014) and infrequent paroxysmal atrial fibrillation (atrial flutter cavotricuspid isthmus ablation 2014). She has history of diastolic heart failure with infrequent exacerbation, most recently following an episode of lower GI bleeding with severe anemia in March 2021.  Xarelto was switched to Eliquis after that episode of bleeding.  She is generally done well from a cardiac point of view since her last appointment.  Her daughter [name] Baird very carefully monitors her mother's weight, restricts her sodium intake and adjust her diuretic dose appropriately.  The patient has requested some increased salt in her meals recently, but this is frequently followed by weight gain, abdominal fullness and lower extremity edema towards the end of the day.  She does best when her "dry weight" is around 160-161 pounds on the home scale, today she weighed 162 pounds.  Our office scale shows 3 pounds more than 165 pounds.  She continues to complain of easy fatigue, but for the most part can do activities of self-care without dyspnea.  Walking longer distances consistently makes her short of breath.  She denies dizziness or syncope and has not been troubled by palpitations, chest pain, falls or injuries.  Her eyesight is very poor and she  can no longer read.  Even has difficulty with watching television.  Interrogation of her pacemaker today does not show any arrhythmia since the couple of episodes of atrial tachycardia that she had during her hospitalization with GI bleeding in March 2021.  Overall episodes of mode switch are well under 0.1%.  She has had a couple of episodes of nonsustained VT each consisting of about 7 beats, one in June and one in early December.  Device function is normal. Her current generator was implanted in 2016 (leads from 2007). Estimated generator longevity is 5 years.  She has 93 % atrial pacing and 99.9 % ventricular pacing.  She has very blunted histograms, but is also very sedentary.  She is pacemaker dependent due to complete heart block.  In May 2019 she saw Dr. Lowanda Foster Zwischenberger at [facility] to discuss treatment for pulmonary artery aneurysm.  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  Conservative management was recommended.  Echo performed there showed a left ventricular ejection fraction of 45-50%, severe left atrial enlargement, moderate regurgitation of all 4 cardiac valves, in addition to the dilated pulmonary artery and left pulmonary artery aneurysm.  In April 2017 she had one episode that lasted for 6 hours.  In March 2018 the atrial fibrillation only lasted for just over 2 minutes.  She had a 7 beat run of nonsustained ventricular tachycardia in March 2020.  She had a flutter ablation performed by Dr. Johney Frame in July 2014 and is not taking  any antiarrhythmics at this time, other than a low dose of beta blocker. At the time of the EP study she was noted to have conventional counterclockwise isthmus-dependent right atrial flutter but then a second type of right atrial flutter was noted briefly. It could not be reinduced for full study. She has done poorly on conventional beta-blockers (atenolol, metoprolol), but tolerates  Bystolic well.   Past Medical History:  Diagnosis Date  . Anxiety   . Atrial flutter (HCC)    s/p ablation 06/29/2013 by Dr Johney Frame  . CHF (congestive heart failure) (HCC)   . Complete heart block (HCC) 2007   s/p medtronic pacemaker implantation  . Depression   . Glaucoma   . Hypertension   . Pulmonary stenosis sp valvotomy       . Shortness of breath   . Status post patch closure of ASD     Past Surgical History:  Procedure Laterality Date  . ABLATION  06/29/2013   s/p isthmus ablation for atrial flutter by Dr Johney Frame  . AORTIC VALVE REPAIR    . ATRIAL FLUTTER ABLATION N/A 06/29/2013   Procedure: ATRIAL FLUTTER ABLATION;  Surgeon: Hillis Range, MD;  Location: [facility] CATH LAB;  Service: Cardiovascular;  Laterality: N/A;  . CARDIAC CATHETERIZATION N/A 06/27/2015   Procedure: Temporary Pacemaker;  Surgeon: Thurmon Fair, MD;  Location: [facility] INVASIVE CV LAB;  Service: Cardiovascular;  Laterality: N/A;  . CARDIOVERSION N/A 03/24/2013   Procedure: CARDIOVERSION;  Surgeon: Thurmon Fair, MD;  Location: [facility] ENDOSCOPY;  Service: Cardiovascular;  Laterality: N/A;  . COLONOSCOPY WITH PROPOFOL Left 03/08/2020   Procedure: COLONOSCOPY WITH PROPOFOL;  Surgeon: Willis Modena, MD;  Location: [facility] ENDOSCOPY;  Service: Endoscopy;  Laterality: Left;  . EP IMPLANTABLE DEVICE N/A 06/27/2015   Procedure: PPM Generator Changeout;  Surgeon: Thurmon Fair, MD;  Location: [facility] INVASIVE CV LAB;  Service: Cardiovascular;  Laterality: N/A;  . ESOPHAGOGASTRODUODENOSCOPY (EGD) WITH PROPOFOL Left 03/07/2020   Procedure: ESOPHAGOGASTRODUODENOSCOPY (EGD) WITH PROPOFOL;  Surgeon: Willis Modena, MD;  Location[facility] ENDOSCOPY;  Service: Endoscopy;  Laterality: Left;  . PACEMAKER INSERTION  2007   Medtronic Adapta ADDRL1, serial # [id number]  . pulmonary valvotomy  1985  . trigeminal sx     left side    Outpatient Medications Prior to Visit  Medication Sig Dispense Refill  . acetaminophen (TYLENOL) 650 MG CR tablet Take 1,300  mg by mouth daily as needed for pain.     . bacitracin ophthalmic ointment     . BYSTOLIC 10 MG tablet TAKE 1 TABLET BY MOUTH EVERY DAY 90 tablet 3  . calcium-vitamin D (OSCAL) 250-125 MG-UNIT per tablet Take 1 tablet by mouth daily.    . candesartan (ATACAND) 8 MG tablet TAKE 1 TABLET BY MOUTH EVERY DAY 90 tablet 3  . clonazePAM (KLONOPIN) 0.5 MG tablet Take 0.25 mg by mouth at bedtime.     . dorzolamide-timolol (COSOPT) 22.3-6.8 MG/ML ophthalmic solution Place 2 drops into both eyes 2 (two) times daily.    [name] Kitchen latanoprost (XALATAN) 0.005 % ophthalmic solution Place 1 drop into both eyes at bedtime.    . Multiple Vitamins-Minerals (MULTIVITAMIN PO) Take 1 tablet by mouth daily.     [name] Kitchen omeprazole (PRILOSEC) 40 MG capsule Take 1 capsule (40 mg total) by mouth daily. (Patient taking differently: Take 20 mg by mouth daily.) 90 capsule 3  . RHOPRESSA 0.02 % SOLN     . Rivaroxaban (XARELTO) 15 MG TABS tablet Take 1 tablet (15 mg total) by mouth daily with  suppMarland Kitchener. 30 tablet 6  . rosuvastatin (CRESTOR) 10 MG tablet Take 10 mg by mouth at bedtime.    . venlafaxine XR (EFFEXOR-XR) 75 MG 24 hr capsule Take 75 mg by mouth daily.    . furosemide (LASIX) 40 MG tablet TAKE  x3 DAYS THEN BACK TO one tablet (40 mg) on Tues, Thurs, Sat and Sunday. Take 2 tablets (80 mg) on Mon, Wed and Friday 50 tablet 5  . potassium chloride (KLOR-CON) 10 MEQ tablet Take one tablet (10 mEq) on Tues, Thurs, Sat and Sunday. Take 2 tablets (20 mEq) on Mon, Wed and Friday 40 tablet 5   No facility-administered medications prior to visit.     Allergies:   Sulfa antibiotics and Hydrocodone   Social History   Socioeconomic History  . Marital status: Widowed    Spouse name: Not on file  . Number of children: 2  . Years of education: Not on file  . Highest education level: Not on file  Occupational History  . Not on file  Tobacco Use  . Smoking status: Never Smoker  . Smokeless tobacco: Never Used  Vaping Use  . Vaping  Use: Never used  Substance and Sexual Activity  . Alcohol use: No  . Drug use: No  . Sexual activity: Never  Other Topics Concern  . Not on file  Social History Narrative  . Not on file   Social Determinants of Health   Financial Resource Strain: Not on file  Food Insecurity: Not on file  Transportation Needs: Not on file  Physical Activity: Not on file  Stress: Not on file  Social Connections: Not on file    ROS:   Please see the history of present illness.    All other systems are reviewed and are negative.    PHYSICAL EXAM:   VS:  BP 140/79   Pulse 96   Ht 5' 2.5" (1.588 m)   Wt 165 lb (74.8 kg)   SpO2 99%   BMI 29.70 kg/m      General: Alert, oriented x3, no distress, healthy left subclavian pacemaker site Head: no evidence of trauma, partial closure of left eyelids after surgery, no myxedema, no xanthelasma; normal ears, nose and oropharynx Neck: Mild elevation (6-7 cm) in jugular venous pulsations and moderate hepatojugular reflux; brisk carotid pulses without delay and no carotid bruits Chest: clear to auscultation, no signs of consolidation by percussion or palpation, normal fremitus, symmetrical and full respiratory excursions Cardiovascular: normal position and quality of the apical impulse, regular rhythm, normal first and toxically split second heart sounds, 2/6 systolic ejection murmur and 1/6 decrescendo diastolic murmur in the pulmonic focus, no apical murmurs, rubs or gallops Abdomen: no tenderness or distention, no masses by palpation, no abnormal pulsatility or arterial bruits, normal bowel sounds, no hepatosplenomegaly Extremities: no clubbing, cyanosis or edema; 2+ radial, ulnar and brachial pulses bilaterally; 2+ right femoral, posterior tibial and dorsalis pedis pulses; 2+ left femoral, posterior tibial and dorsalis pedis pulses; no subclavian or femoral bruits Neurological: grossly nonfocal Psych: Normal mood and affect   Wt Readings from Last 3  Encounters:  12/16/20 165 lb (74.8 kg)  10/03/20 163 lb (73.9 kg)  06/03/20 163 lb (73.9 kg)    Studies/Labs Reviewed:   ECHO 10/24/2020: 1. Left ventricular ejection fraction, by estimation, is 45 to 50%. The  left ventricle has mildly decreased function. The left ventricle  demonstrates regional wall motion abnormalities with septal-lateral  dyssynchrony consistent with RV pacing. Left  ventricular  diastolic parameters are consistent with Grade I diastolic  dysfunction (impaired relaxation).  2. Right ventricular systolic function is mildly reduced. The right  ventricular size is mildly enlarged. There is mildly elevated pulmonary  artery systolic pressure. The estimated right ventricular systolic  pressure is 37.1 mmHg.  3. Left atrial size was mildly dilated.  4. Right atrial size was mildly dilated.  5. Tricuspid valve regurgitation is moderate.  6. The aortic valve is tricuspid. Aortic valve regurgitation is mild.  Mild aortic valve sclerosis is present, with no evidence of aortic valve  stenosis.  7. Thickened and doming pulmonary valve. There does not appear to be  significant stenosis. There is mild to moderate pulmonic insufficiency.  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  8. Aortic dilatation noted. There is mild dilatation of the ascending  aorta, measuring 40 mm.  9. The inferior vena cava is normal in size with greater than 50%  respiratory variability, suggesting right atrial pressure of 3 mmHg.  10. The mitral valve is normal in structure. Trivial mitral valve  regurgitation.   EKG:  EKG is not ordered today.  Intracardiac EGM shows AV sequential pacing  Lipid Panel     Component Value Date/Time   CHOL 119 06/03/2020 0918   TRIG 119 06/03/2020 0918   HDL 53 06/03/2020 0918   CHOLHDL 2.2 06/03/2020 0918   LDLCALC 45 06/03/2020 0918   BMET    Component Value Date/Time   NA 139 10/03/2020 1029   K 5.2 10/03/2020 1029   K 4.2 09/13/2013  1609   CL 101 10/03/2020 1029   CO2 24 10/03/2020 1029   GLUCOSE 110 (H) 10/03/2020 1029   GLUCOSE 132 (H) 03/08/2020 0813   BUN 17 10/03/2020 1029   CREATININE 1.13 (H) 10/03/2020 1029   CREATININE 1.03 06/25/2015 1012   CALCIUM 10.2 10/03/2020 1029   GFRNONAA 44 (L) 10/03/2020 1029   GFRAA 51 (L) 10/03/2020 1029   ASSESSMENT:    1. Chronic diastolic heart failure (HCC)   2. Paroxysmal atrial fibrillation (HCC)   3. Complete heart block (HCC)   4. Pacemaker   5. Long term current use of anticoagulant   6. Hypercholesterolemia   7. Pulmonic stenosis, congenital   8. History of repair of congenital atrial septal defect (ASD)   9. Aneurysm of left pulmonary artery (HCC)   10. Anemia, unspecified type     PLAN:  In order of problems listed above:  1. CHF, diastolic: NYHA class II, showing some relatively subtle signs of hypervolemia.  Explained to [facility] why it is important for her to maintain the sodium restricted diet and told her that her daughter [name] Baird is doing a great job managing her heart failure and keeping her at the hospital. She has borderline reduced LVEF, likely related to RV apical pacing.  No change in scheduled diuretic dose, but made the prescription 80 mg daily so that they have the ability to increase the dose as needed for fluid gain.. 2. PAFlutter/ Afib: Episodes have been extremely infrequent. Very low burden of arrhythmia (0.1%), no meaningful episodes since November 2020.  CHADSVasc4 (age 43, gender, HF). 3. CHB: Pacemaker dependent.  She feels very poorly with threshold testing. 4. PPM: Normal device function.  Continue remote downloads every 3 months.  Office visit every 6 months 5. Anticoagulation: Tolerated without overt bleeding.  Xarelto dose adjusted for age, small body habitus, GFR less than 50. 6. HLP: All labs checked in June in target range.[name] Kitchen  Will also check TSH and Vit D at PCP request. 7. PS s/p surgical valvotomy: Mild to moderate regurgitation, no  residual stenosis by echocardiogram.  8. ASD s/p patch closure: No signs of residual shunt on echo, no evidence of right heart failure. 9. L PA aneurysm: 6.4 cm.  Plan conservative, non-surgical management.  Serial imaging for measurements due not appear to be indicated since there is no intention of performing surgery for this abnormality.. 10. Anemia: Hemoglobin has stabilized in the 11-12 range.     Medication Adjustments/Labs and Tests Ordered: Current medicines are reviewed at length with the patient today.  Concerns regarding medicines are outlined above.  Medication changes, Labs and Tests ordered today are listed in the Patient Instructions below. Patient Instructions  Medication Instructions:  No changes *If you need a refill on your cardiac medications before your next appointment, please call your pharmacy*   Lab Work: None ordered If you have labs (blood work) drawn today and your tests are completely normal, you will receive your results only by: [name] Kitchen. MyChart Message (if you have MyChart) OR . A paper copy in the mail If you have any lab test that is abnormal or we need to change your treatment, we will call you to review the results.   Testing/Procedures: None ordered   Follow-Up: At [facility], you and your health needs are our priority.  As part of our continuing mission to provide you with exceptional heart care, we have created designated Provider Care Teams.  These Care Teams include your primary Cardiologist (physician) and Advanced Practice Providers (APPs -  Physician Assistants and Nurse Practitioners) who all work together to provide you with the care you need, when you need it.  We recommend signing up for the patient portal called "MyChart".  Sign up information is provided on this After Visit Summary.  MyChart is used to connect with patients for Virtual Visits (Telemedicine).  Patients are able to view lab/test results, encounter notes, upcoming  appointments, etc.  Non-urgent messages can be sent to your provider as well.   To learn more about what you can do with MyChart, go to [email or web address].    Your next appointment:   6 month(s)  The format for your next appointment:   In Person  Provider:   Thurmon FairMihai Karthik Whittinghill, MD     Signed, Thurmon FairMihai Harmonie Verrastro, MD  12/17/2020 3:43 PM    [facility] [address], [place], [place]  [address] Phone: [phone]; Fax: [phone]

## 2020-12-16 NOTE — Patient Instructions (Signed)

## 2020-12-17 ENCOUNTER — Encounter: Payer: Self-pay | Admitting: Cardiovascular Disease

## 2021-01-01 ENCOUNTER — Other Ambulatory Visit: Payer: Self-pay | Admitting: Cardiovascular Disease

## 2021-01-28 ENCOUNTER — Other Ambulatory Visit: Payer: Self-pay | Admitting: Cardiovascular Disease

## 2021-02-12 ENCOUNTER — Ambulatory Visit (INDEPENDENT_AMBULATORY_CARE_PROVIDER_SITE_OTHER): Payer: Medicare Other

## 2021-02-12 ENCOUNTER — Encounter: Payer: Self-pay | Admitting: Emergency Medicine

## 2021-02-12 ENCOUNTER — Other Ambulatory Visit: Payer: Self-pay

## 2021-02-12 ENCOUNTER — Ambulatory Visit
Admission: EM | Admit: 2021-02-12 | Discharge: 2021-02-12 | Disposition: A | Discharge status: Home / Self Care | Payer: Medicare Other | Attending: Physician Assistant | Admitting: Physician Assistant

## 2021-02-12 DIAGNOSIS — M25551 Pain in right hip: Secondary | ICD-10-CM | POA: Diagnosis not present

## 2021-02-12 DIAGNOSIS — M79604 Pain in right leg: Secondary | ICD-10-CM

## 2021-02-12 MED ORDER — TRAMADOL HCL 50 MG PO TABS: 50.0000 mg | ORAL_TABLET | Freq: Two times a day (BID) | ORAL | 0 refills | Status: AC | PRN

## 2021-02-12 NOTE — ED Triage Notes (Signed)
RT hip pain since after squatting to get into a chair on Sunday

## 2021-02-13 NOTE — ED Provider Notes (Signed)
[facility]    CSN: [id number] Arrival date & time: 02/12/21  0854      History   Chief Complaint Chief Complaint  Patient presents with  . Hip Pain    HPI [name] Baird is a 87 y.o. female.   Pt complains of pain after squatting and standing on Saturday.  Pt reports pain is in thigh and front of hip   The history is provided by the patient. No language interpreter was used.  Hip Pain This is a new problem. The current episode started more than 2 days ago. The problem occurs constantly. Nothing aggravates the symptoms. Nothing relieves the symptoms. She has tried nothing for the symptoms.    Past Medical History:  Diagnosis Date  . Anxiety   . Atrial flutter (HCC)    s/p ablation 06/29/2013 by Dr Johney Frame  . CHF (congestive heart failure) (HCC)   . Complete heart block (HCC) 2007   s/p medtronic pacemaker implantation  . Depression   . Glaucoma   . Hypertension   . Pulmonary stenosis sp valvotomy       . Shortness of breath   . Status post patch closure of ASD     Patient Active Problem List   Diagnosis Date Noted  . Acute GI bleeding 03/06/2020  . Hypercholesterolemia 08/08/2018  . 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 Cough 04/21/2018  . Long term current use of anticoagulant therapy 06/26/2016  . Indigestion 03/25/2016  . Chronic diastolic heart failure (HCC) 01/16/2016  . Pacemaker battery depletion 06/27/2015  . Paroxysmal atrial fibrillation (HCC) 08/03/2013  . Atrial flutter (HCC) 06/08/2013  . Complete heart block (HCC) 06/08/2013  . Pacemaker   . Status post patch closure of ASD   . Pulmonary stenosis sp valvotomy     Past Surgical History:  Procedure Laterality Date  . ABLATION  06/29/2013   s/p isthmus ablation for atrial flutter by Dr Johney Frame  . AORTIC VALVE REPAIR    . ATRIAL FLUTTER ABLATION N/A 06/29/2013   Procedure: ATRIAL FLUTTER ABLATION;  Surgeon: Hillis Range, MD;  Location: [facility] CATH LAB;  Service:  Cardiovascular;  Laterality: N/A;  . CARDIAC CATHETERIZATION N/A 06/27/2015   Procedure: Temporary Pacemaker;  Surgeon: Thurmon Fair, MD;  Location: [facility] INVASIVE CV LAB;  Service: Cardiovascular;  Laterality: N/A;  . CARDIOVERSION N/A 03/24/2013   Procedure: CARDIOVERSION;  Surgeon: Thurmon Fair, MD;  Location: [facility] ENDOSCOPY;  Service: Cardiovascular;  Laterality: N/A;  . COLONOSCOPY WITH PROPOFOL Left 03/08/2020   Procedure: COLONOSCOPY WITH PROPOFOL;  Surgeon: Willis Modena, MD;  Location: [facility] ENDOSCOPY;  Service: Endoscopy;  Laterality: Left;  . EP IMPLANTABLE DEVICE N/A 06/27/2015   Procedure: PPM Generator Changeout;  Surgeon: Thurmon Fair, MD;  Location: [facility] INVASIVE CV LAB;  Service: Cardiovascular;  Laterality: N/A;  . ESOPHAGOGASTRODUODENOSCOPY (EGD) WITH PROPOFOL Left 03/07/2020   Procedure: ESOPHAGOGASTRODUODENOSCOPY (EGD) WITH PROPOFOL;  Surgeon: Willis Modena, MD;  Location: [facility] ENDOSCOPY;  Service: Endoscopy;  Laterality: Left;  . PACEMAKER INSERTION  2007   Medtronic Adapta ADDRL1, serial # [id number]  . pulmonary valvotomy  1985  . trigeminal sx     left side    OB History   No obstetric history on file.      Home Medications    Prior to Admission medications   Medication Sig Start Date End Date Taking? Authorizing Provider  traMADol (ULTRAM) 50 MG tablet Take 1 tablet (50 mg total) by mouth every 12 (twelve) hours as needed. 02/12/21  02/12/22 Yes Elson Areas, PA-C  acetaminophen (TYLENOL) 650 MG CR tablet Take 1,300 mg by mouth daily as needed for pain.     [provider]  bacitracin ophthalmic ointment  09/10/20   [provider]  BYSTOLIC 10 MG tablet TAKE 1 TABLET BY MOUTH EVERY DAY 08/29/20   Marykay Lex, MD  calcium-vitamin D (OSCAL) 250-125 MG-UNIT per tablet Take 1 tablet by mouth daily.    [provider]  candesartan (ATACAND) 8 MG tablet TAKE 1 TABLET BY MOUTH EVERY DAY 08/16/20   Croitoru, Mihai, MD  clonazePAM (KLONOPIN) 0.5 MG  tablet Take 0.25 mg by mouth at bedtime.     [provider]  dorzolamide-timolol (COSOPT) 22.3-6.8 MG/ML ophthalmic solution Place 2 drops into both eyes 2 (two) times daily.    [provider]  furosemide (LASIX) 40 MG tablet Take 1 tablet (40 mg total) by mouth 2 (two) times daily. 12/16/20   Croitoru, Mihai, MD  latanoprost (XALATAN) 0.005 % ophthalmic solution Place 1 drop into both eyes at bedtime.    [provider]  Multiple Vitamins-Minerals (MULTIVITAMIN PO) Take 1 tablet by mouth daily.     [provider]  omeprazole (PRILOSEC) 40 MG capsule Take 1 capsule (40 mg total) by mouth daily. Patient taking differently: Take 20 mg by mouth daily. 03/18/20   Croitoru, Mihai, MD  potassium chloride (KLOR-CON) 10 MEQ tablet Take 2 tablets (20 mEq total) by mouth 2 (two) times daily. 12/16/20   Croitoru, Mihai, MD  RHOPRESSA 0.02 % SOLN  09/13/20   [provider]  Rivaroxaban (XARELTO) 15 MG TABS tablet Take 1 tablet (15 mg total) by mouth daily with supper. 06/18/20   Croitoru, Mihai, MD  rosuvastatin (CRESTOR) 10 MG tablet Take 10 mg by mouth at bedtime.    [provider]  venlafaxine XR (EFFEXOR-XR) 75 MG 24 hr capsule Take 75 mg by mouth daily. 06/20/15   [provider]    Family History Family History  Problem Relation Age of Onset  . Heart failure Mother   . Stroke Father   . Multiple sclerosis Daughter     Social History Social History   Tobacco Use  . Smoking status: Never Smoker  . Smokeless tobacco: Never Used  Vaping Use  . Vaping Use: Never used  Substance Use Topics  . Alcohol use: No  . Drug use: No     Allergies   Sulfa antibiotics and Hydrocodone   Review of Systems Review of Systems  All other systems reviewed and are negative.    Physical Exam Triage Vital Signs ED Triage Vitals  Enc Vitals Group     BP 02/12/21 0915 137/81     Pulse Rate 02/12/21 0915 87     Resp 02/12/21 0915 16      Temp 02/12/21 0915 97.9 F (36.6 C)     Temp Source 02/12/21 0915 Tympanic     SpO2 02/12/21 0915 95 %     Weight --      Height --      Head Circumference --      Peak Flow --      Pain Score 02/12/21 0921 5     Pain Loc --      Pain Edu? --      Excl. in GC? --    No data found.  Updated Vital Signs BP 137/81 (BP Location: Right Arm)   Pulse 87   Temp 97.9 F (36.6 C) (  Tympanic)   Resp 16   SpO2 95%   Visual Acuity Right Eye Distance:   Left Eye Distance:   Bilateral Distance:    Right Eye Near:   Left Eye Near:    Bilateral Near:     Physical Exam Vitals and nursing note reviewed.  Constitutional:      Appearance: She is well-developed and well-nourished.  HENT:     Head: Normocephalic.  Eyes:     Extraocular Movements: EOM normal.  Pulmonary:     Effort: Pulmonary effort is normal.  Abdominal:     General: There is no distension.  Musculoskeletal:        General: No swelling or tenderness. Normal range of motion.     Cervical back: Normal range of motion.     Comments: Tender right hip and right thigh  Neurological:     Mental Status: She is alert and oriented to person, place, and time.  Psychiatric:        Mood and Affect: Mood and affect normal.      UC Treatments / Results  Labs (all labs ordered are listed, but only abnormal results are displayed) Labs Reviewed - No data to display  EKG   Radiology DG Hip Unilat W or Wo Pelvis 2-3 Views Right  Result Date: 02/12/2021 CLINICAL DATA:  Right hip pain with bending EXAM: DG HIP (WITH OR WITHOUT PELVIS) 2-3V RIGHT COMPARISON:  None. FINDINGS: Right greater than left degenerative arthropathy of the hips with joint space loss, sclerosis and bony spurring. Bones are osteopenic. Right hip appears intact without fracture or malalignment. No subluxation or dislocation. Degenerative changes of the lumbosacral spine. Pelvis appears intact. IMPRESSION: Degenerative changes and osteopenia. No acute osseous  finding by plain radiography. Electronically Signed   By: Judie Petit.  Shick M.D.   On: 02/12/2021 11:12    Procedures Procedures (including critical care time)  Medications Ordered in UC Medications - No data to display  Initial Impression / Assessment and Plan / UC Course  I have reviewed the triage vital signs and the nursing notes.  Pertinent labs & imaging results that were available during my care of the patient were reviewed by me and considered in my medical decision making (see chart for details).     MDM:  Pt given 10 tramadol.  She has taken well in the past.  Pt advised ice and or heat.  Final Clinical Impressions(s) / UC Diagnoses   Final diagnoses:  Leg pain, right   Discharge Instructions   None    ED Prescriptions    Medication Sig Dispense Auth. Provider   traMADol (ULTRAM) 50 MG tablet Take 1 tablet (50 mg total) by mouth every 12 (twelve) hours as needed. 10 tablet Elson Areas, [place]     I have reviewed the PDMP during this encounter.  An After Visit Summary was printed and given to the patient.    Elson Areas, [place] 02/13/21 [phone]

## 2021-02-26 ENCOUNTER — Ambulatory Visit (INDEPENDENT_AMBULATORY_CARE_PROVIDER_SITE_OTHER): Payer: Medicare Other

## 2021-02-26 DIAGNOSIS — I442 Atrioventricular block, complete: Secondary | ICD-10-CM | POA: Diagnosis not present

## 2021-02-28 ENCOUNTER — Other Ambulatory Visit: Payer: Self-pay

## 2021-02-28 ENCOUNTER — Ambulatory Visit (INDEPENDENT_AMBULATORY_CARE_PROVIDER_SITE_OTHER): Payer: Medicare Other | Admitting: Nurse Practitioner

## 2021-02-28 ENCOUNTER — Encounter: Payer: Self-pay | Admitting: Nurse Practitioner

## 2021-02-28 VITALS — BP 130/78 | HR 78 | Temp 98.7°F | Resp 20 | Ht 62.0 in | Wt 163.0 lb

## 2021-02-28 DIAGNOSIS — S91001A Unspecified open wound, right ankle, initial encounter: Secondary | ICD-10-CM

## 2021-02-28 DIAGNOSIS — H547 Unspecified visual loss: Secondary | ICD-10-CM | POA: Diagnosis not present

## 2021-02-28 DIAGNOSIS — I442 Atrioventricular block, complete: Secondary | ICD-10-CM | POA: Diagnosis not present

## 2021-02-28 DIAGNOSIS — Z7689 Persons encountering health services in other specified circumstances: Secondary | ICD-10-CM

## 2021-02-28 DIAGNOSIS — I10 Essential (primary) hypertension: Secondary | ICD-10-CM | POA: Diagnosis not present

## 2021-02-28 DIAGNOSIS — R7301 Impaired fasting glucose: Secondary | ICD-10-CM | POA: Diagnosis not present

## 2021-02-28 DIAGNOSIS — R7303 Prediabetes: Secondary | ICD-10-CM | POA: Insufficient documentation

## 2021-02-28 DIAGNOSIS — L89512 Pressure ulcer of right ankle, stage 2: Secondary | ICD-10-CM | POA: Insufficient documentation

## 2021-02-28 DIAGNOSIS — I5032 Chronic diastolic (congestive) heart failure: Secondary | ICD-10-CM

## 2021-02-28 DIAGNOSIS — H409 Unspecified glaucoma: Secondary | ICD-10-CM

## 2021-02-28 LAB — CUP PACEART REMOTE DEVICE CHECK
Battery Impedance: 833 Ohm
Battery Remaining Longevity: 59 mo
Battery Voltage: 2.77 V
Brady Statistic AP VP Percent: 92 %
Brady Statistic AP VS Percent: 1 %
Brady Statistic AS VP Percent: 7 %
Brady Statistic AS VS Percent: 0 %
Date Time Interrogation Session: 20220309070925
Implantable Lead Implant Date: 20070920
Implantable Lead Implant Date: 20070920
Implantable Lead Location: 753859
Implantable Lead Location: 753860
Implantable Lead Model: 4592
Implantable Lead Model: 5092
Implantable Pulse Generator Implant Date: 20160707
Lead Channel Impedance Value: 488 Ohm
Lead Channel Impedance Value: 732 Ohm
Lead Channel Pacing Threshold Amplitude: 0.625 V
Lead Channel Pacing Threshold Amplitude: 1.25 V
Lead Channel Pacing Threshold Pulse Width: 0.4 ms
Lead Channel Pacing Threshold Pulse Width: 0.4 ms
Lead Channel Setting Pacing Amplitude: 2 V
Lead Channel Setting Pacing Amplitude: 2.5 V
Lead Channel Setting Pacing Pulse Width: 0.4 ms
Lead Channel Setting Sensing Sensitivity: 5.6 mV

## 2021-02-28 NOTE — Assessment & Plan Note (Signed)
-  superficial and dime sized over lateral malleolus of right leg, yellow wound bed with pink peri-wound tissue -has venous insufficiency to left foot, but given the location this is likely pressure-related -we discussed off-loading strategies and to try a little neosporin and a foam bandage

## 2021-02-28 NOTE — Assessment & Plan Note (Signed)
-  obtain medical records 

## 2021-02-28 NOTE — Assessment & Plan Note (Signed)
-  take rhopressa and dorzolamide-timolol drops

## 2021-02-28 NOTE — Assessment & Plan Note (Addendum)
-  takes bystolic, candesartan, and lasix -well controlled today

## 2021-02-28 NOTE — Progress Notes (Signed)
New Patient Office Visit  Subjective:  Patient ID: [name] Baird, female    DOB: January 24, 1933  Age: 87 y.o. MRN: [id number]  CC:  Chief Complaint  Patient presents with  . New Patient (Initial Visit)    HPI [name] Baird presents for new patient visit. Transferring care from [facility]. Last physical was October 2021. Last labs were drawn on 10/03/20  Her right ankle has a scratch on her lateral malleolus. She has right leg pain that starts in her thigh and radiates down her leg leg into her knee. She has history of arthritis and this pain is chronic.  She had impaired fasting glucose at that time.  She sees [facility] for her glaucoma. Had eye surgery after repeated eye infections and then had surgeyr to close her eyelid, and her infections have stopped.  Had complete heart block, and has pacemaker to right chest. Has left pulmonary aneurysm, but surgery does not want to touch that given her condition/age. Past Medical History:  Diagnosis Date  . Anxiety   . Atrial flutter ([place])    s/p ablation 06/29/2013 by Dr Rayann Heman  . CHF (congestive heart failure) ([place])   . Complete heart block ([place]) 2007   s/p medtronic pacemaker implantation  . Depression   . Glaucoma   . Hypertension   . Pulmonary stenosis sp valvotomy       . Shortness of breath   . Status post patch closure of ASD     Past Surgical History:  Procedure Laterality Date  . ABLATION  06/29/2013   s/p isthmus ablation for atrial flutter by Dr Rayann Heman  . AORTIC VALVE REPAIR    . ATRIAL FLUTTER ABLATION N/A 06/29/2013   Procedure: ATRIAL FLUTTER ABLATION;  Surgeon: Thompson Grayer, MD;  Location: [facility] CATH LAB;  Service: Cardiovascular;  Laterality: N/A;  . CARDIAC CATHETERIZATION N/A 06/27/2015   Procedure: Temporary Pacemaker;  Surgeon: Sanda Klein, MD;  Location: [facility] CV LAB;  Service: Cardiovascular;  Laterality: N/A;  . CARDIOVERSION N/A 03/24/2013   Procedure: CARDIOVERSION;  Surgeon:  Sanda Klein, MD;  Location: [facility] ENDOSCOPY;  Service: Cardiovascular;  Laterality: N/A;  . COLONOSCOPY WITH PROPOFOL Left 03/08/2020   Procedure: COLONOSCOPY WITH PROPOFOL;  Surgeon: Arta Silence, MD;  Location: [place];  Service: Endoscopy;  Laterality: Left;  . EP IMPLANTABLE DEVICE N/A 06/27/2015   Procedure: PPM Generator Changeout;  Surgeon: Sanda Klein, MD;  Location: [place] CV LAB;  Service: Cardiovascular;  Laterality: N/A;  . ESOPHAGOGASTRODUODENOSCOPY (EGD) WITH PROPOFOL Left 03/07/2020   Procedure: ESOPHAGOGASTRODUODENOSCOPY (EGD) WITH PROPOFOL;  Surgeon: Arta Silence, MD;  Location: [facility] ENDOSCOPY;  Service: Endoscopy;  Laterality: Left;  . PACEMAKER INSERTION  2007   Medtronic Adapta ADDRL1, serial # [id number]  . pulmonary valvotomy  1985  . trigeminal sx     left side    Family History  Problem Relation Age of Onset  . Heart failure Mother   . Stroke Father   . Multiple sclerosis Daughter     Social History   Socioeconomic History  . Marital status: Widowed    Spouse name: Not on file  . Number of children: 2  . Years of education: Not on file  . Highest education level: Not on file  Occupational History  . Occupation: retired/homemaker  Tobacco Use  . Smoking status: Never Smoker  . Smokeless tobacco: Never Used  Vaping Use  . Vaping Use: Never used  Substance and Sexual Activity  . Alcohol use:  No  . Drug use: No  . Sexual activity: Never  Other Topics Concern  . Not on file  Social History Narrative  . Not on file   Social Determinants of Health   Financial Resource Strain: Not on file  Food Insecurity: Not on file  Transportation Needs: Not on file  Physical Activity: Not on file  Stress: Not on file  Social Connections: Not on file  Intimate Partner Violence: Not on file    ROS Review of Systems  Constitutional: Negative.   Eyes:       Chronic poor vision  Respiratory: Negative.   Cardiovascular: Negative.   Skin:  Positive for wound.       Right lateral malleolus    Objective:   Today's Vitals: BP 130/78   Pulse 78   Temp 98.7 F (37.1 C)   Resp 20   Ht 5' 2"  (1.575 m)   Wt 163 lb (73.9 kg)   SpO2 94%   BMI 29.81 kg/m   Physical Exam Constitutional:      Appearance: Normal appearance.  Cardiovascular:     Rate and Rhythm: Normal rate and regular rhythm.     Pulses: Normal pulses.     Comments: Had pulmonic valvotomy in 1985 causes abnormal heart sounds Pulmonary:     Effort: Pulmonary effort is normal.     Breath sounds: Normal breath sounds.  Musculoskeletal:     Comments: Uses w/c for ambulation  Skin:    Comments: Wound to right ankle over her lateral malleolus, dime-sized  Neurological:     Mental Status: She is alert.     Assessment & Plan:   Problem List Items Addressed This Visit      Cardiovascular and Mediastinum   Complete heart block (HCC)   Chronic diastolic heart failure ([place])    -takes bystolic, candesartan, and lasix -followed by cardiology      High blood pressure    -takes bystolic, candesartan, and lasix -well controlled today        Endocrine   Impaired fasting blood sugar - Primary    -she brought her labs from Oct 2021 with her, and glucose at that time was 110 -will check A1c      Relevant Orders   Hemoglobin A1c     Other   Vision loss    -followed by ophthalmology, [place]      Glaucoma    -take rhopressa and dorzolamide-timolol drops      Encounter to establish care    -obtain medical records      Relevant Orders   CBC with Differential/Platelet   CMP14+EGFR   Lipid Panel With LDL/HDL Ratio   Hemoglobin A1c   Open wound of right ankle    -superficial and dime sized over lateral malleolus of right leg, yellow wound bed with pink peri-wound tissue -has venous insufficiency to left foot, but given the location this is likely pressure-related -we discussed off-loading strategies and to try a little neosporin and  a foam bandage         Outpatient Encounter Medications as of 02/28/2021  Medication Sig  . acetaminophen (TYLENOL) 650 MG CR tablet Take 1,300 mg by mouth daily as needed for pain.   . bacitracin ophthalmic ointment   . BYSTOLIC 10 MG tablet TAKE 1 TABLET BY MOUTH EVERY DAY  . calcium-vitamin D (OSCAL) 250-125 MG-UNIT per tablet Take 1 tablet by mouth daily.  . candesartan (ATACAND) 8 MG tablet TAKE 1 TABLET  BY MOUTH EVERY DAY  . clonazePAM (KLONOPIN) 0.5 MG tablet Take 0.25 mg by mouth at bedtime.   . dorzolamide-timolol (COSOPT) 22.3-6.8 MG/ML ophthalmic solution Place 2 drops into both eyes 2 (two) times daily.  . furosemide (LASIX) 40 MG tablet Take 1 tablet (40 mg total) by mouth 2 (two) times daily.  [name] Kitchen latanoprost (XALATAN) 0.005 % ophthalmic solution Place 1 drop into both eyes at bedtime.  . Multiple Vitamins-Minerals (MULTIVITAMIN PO) Take 1 tablet by mouth daily.   [name] Kitchen omeprazole (PRILOSEC) 40 MG capsule Take 1 capsule (40 mg total) by mouth daily. (Patient taking differently: Take 20 mg by mouth daily.)  . potassium chloride (KLOR-CON) 10 MEQ tablet Take 2 tablets (20 mEq total) by mouth 2 (two) times daily.  . RHOPRESSA 0.02 % SOLN Place 1 drop into the right eye every other day.  . Rivaroxaban (XARELTO) 15 MG TABS tablet Take 1 tablet (15 mg total) by mouth daily with supper.  . rosuvastatin (CRESTOR) 10 MG tablet Take 10 mg by mouth at bedtime.  . traMADol (ULTRAM) 50 MG tablet Take 1 tablet (50 mg total) by mouth every 12 (twelve) hours as needed.  . venlafaxine XR (EFFEXOR-XR) 75 MG 24 hr capsule Take 75 mg by mouth daily.   No facility-administered encounter medications on file as of 02/28/2021.    Follow-up: Return in about 1 week (around 03/07/2021) for Lab follow-up and recheck wound.   Noreene Larsson, NP

## 2021-02-28 NOTE — Assessment & Plan Note (Addendum)
-  she brought her labs from Oct 2021 with her, and glucose at that time was 110 -will check A1c

## 2021-02-28 NOTE — Assessment & Plan Note (Signed)
-  takes bystolic, candesartan, and lasix -followed by cardiology

## 2021-02-28 NOTE — Assessment & Plan Note (Addendum)
-  followed by ophthalmology, [facility]

## 2021-03-01 LAB — CMP14+EGFR
ALT: 10 IU/L (ref 0–32)
AST: 15 IU/L (ref 0–40)
Albumin/Globulin Ratio: 1.7 (ref 1.2–2.2)
Albumin: 4.5 g/dL (ref 3.6–4.6)
Alkaline Phosphatase: 87 IU/L (ref 44–121)
BUN/Creatinine Ratio: 14 (ref 12–28)
BUN: 17 mg/dL (ref 8–27)
Bilirubin Total: 0.7 mg/dL (ref 0.0–1.2)
CO2: 24 mmol/L (ref 20–29)
Calcium: 9.6 mg/dL (ref 8.7–10.3)
Chloride: 99 mmol/L (ref 96–106)
Creatinine, Ser: 1.25 mg/dL — ABNORMAL HIGH (ref 0.57–1.00)
Globulin, Total: 2.6 g/dL (ref 1.5–4.5)
Glucose: 112 mg/dL — ABNORMAL HIGH (ref 65–99)
Potassium: 4.5 mmol/L (ref 3.5–5.2)
Sodium: 138 mmol/L (ref 134–144)
Total Protein: 7.1 g/dL (ref 6.0–8.5)
eGFR: 42 mL/min/{1.73_m2} — ABNORMAL LOW (ref >59)

## 2021-03-01 LAB — HEMOGLOBIN A1C
Est. average glucose Bld gHb Est-mCnc: 134 mg/dL
Hgb A1c MFr Bld: 6.3 % — ABNORMAL HIGH (ref 4.8–5.6)

## 2021-03-01 LAB — LIPID PANEL WITH LDL/HDL RATIO
Cholesterol, Total: 129 mg/dL (ref 100–199)
HDL: 54 mg/dL (ref >39)
LDL Chol Calc (NIH): 51 mg/dL (ref 0–99)
LDL/HDL Ratio: 0.9 ratio (ref 0.0–3.2)
Triglycerides: 141 mg/dL (ref 0–149)
VLDL Cholesterol Cal: 24 mg/dL (ref 5–40)

## 2021-03-01 LAB — CBC WITH DIFFERENTIAL/PLATELET
Basophils Absolute: 0.1 10*3/uL (ref 0.0–0.2)
Basos: 1 %
EOS (ABSOLUTE): 0.1 10*3/uL (ref 0.0–0.4)
Eos: 2 %
Hematocrit: 39.1 % (ref 34.0–46.6)
Hemoglobin: 12.2 g/dL (ref 11.1–15.9)
Immature Grans (Abs): 0 10*3/uL (ref 0.0–0.1)
Immature Granulocytes: 0 %
Lymphocytes Absolute: 1 10*3/uL (ref 0.7–3.1)
Lymphs: 13 %
MCH: 27.4 pg (ref 26.6–33.0)
MCHC: 31.2 g/dL — ABNORMAL LOW (ref 31.5–35.7)
MCV: 88 fL (ref 79–97)
Monocytes Absolute: 0.6 10*3/uL (ref 0.1–0.9)
Monocytes: 9 %
Neutrophils Absolute: 5.5 10*3/uL (ref 1.4–7.0)
Neutrophils: 75 %
Platelets: 220 10*3/uL (ref 150–450)
RBC: 4.46 x10E6/uL (ref 3.77–5.28)
RDW: 13 % (ref 11.7–15.4)
WBC: 7.4 10*3/uL (ref 3.4–10.8)

## 2021-03-03 NOTE — Progress Notes (Signed)
A1c is in the prediabetes range; otherwise, the labs look stable from previous draws. Kidney function is just a little low, and that is not new. She should increase her water intake just a little.

## 2021-03-06 NOTE — Progress Notes (Signed)
Remote pacemaker transmission.   

## 2021-03-07 ENCOUNTER — Other Ambulatory Visit: Payer: Self-pay

## 2021-03-07 ENCOUNTER — Ambulatory Visit (INDEPENDENT_AMBULATORY_CARE_PROVIDER_SITE_OTHER): Payer: Medicare Other | Admitting: Nurse Practitioner

## 2021-03-07 ENCOUNTER — Encounter: Payer: Self-pay | Admitting: Nurse Practitioner

## 2021-03-07 VITALS — BP 113/73 | HR 75 | Temp 98.5°F | Resp 20 | Ht 62.0 in | Wt 161.0 lb

## 2021-03-07 DIAGNOSIS — N1832 Chronic kidney disease, stage 3b: Secondary | ICD-10-CM

## 2021-03-07 DIAGNOSIS — L89512 Pressure ulcer of right ankle, stage 2: Secondary | ICD-10-CM

## 2021-03-07 DIAGNOSIS — I10 Essential (primary) hypertension: Secondary | ICD-10-CM

## 2021-03-07 DIAGNOSIS — R7303 Prediabetes: Secondary | ICD-10-CM

## 2021-03-07 DIAGNOSIS — R319 Hematuria, unspecified: Secondary | ICD-10-CM

## 2021-03-07 DIAGNOSIS — N390 Urinary tract infection, site not specified: Secondary | ICD-10-CM | POA: Diagnosis not present

## 2021-03-07 MED ORDER — UNABLE TO FIND: 0 refills | Status: DC

## 2021-03-07 MED ORDER — CIPROFLOXACIN HCL 500 MG PO TABS: 500.0000 mg | ORAL_TABLET | Freq: Two times a day (BID) | ORAL | 0 refills | Status: DC

## 2021-03-07 MED ORDER — CIPROFLOXACIN HCL 500 MG PO TABS
500.0000 mg | ORAL_TABLET | Freq: Two times a day (BID) | ORAL | 0 refills | Status: DC
Start: 2021-03-07 — End: 2021-05-12

## 2021-03-07 MED ORDER — OMEPRAZOLE 20 MG PO CPDR
20.0000 mg | DELAYED_RELEASE_CAPSULE | Freq: Every day | ORAL | 1 refills | Status: DC
Start: 2021-03-07 — End: 2022-03-26

## 2021-03-07 NOTE — Patient Instructions (Signed)
Please have labs drawn 2-3 days prior to your next appointment so we can review them at your next office visit.

## 2021-03-07 NOTE — Addendum Note (Signed)
Addended by: Dellia Cloud on: 03/07/2021 11:52 AM   Modules accepted: Orders

## 2021-03-07 NOTE — Progress Notes (Signed)
Established Patient Office Visit  Subjective:  Patient ID: [name] Baird, female    DOB: November 12, 1933  Age: 87 y.o. MRN: [id number]  CC:  Chief Complaint  Patient presents with   Follow-up    Lab work    Dysuria    X 2 weeks     HPI [name] Baird presents for lab follow-up.  She states that she is having intermittent dysuria. No odor noted, but she states is stings when she has been urinating. Has been on and off for 2 weeks.  Past Medical History:  Diagnosis Date   Acute GI bleeding 03/06/2020   Anxiety    Atrial flutter ([place])    s/p ablation 06/29/2013 by Dr Rayann Heman   CHF (congestive heart failure) ([place])    Complete heart block ([place]) 2007   s/p medtronic pacemaker implantation   Depression    Glaucoma    Hypertension    Pulmonary stenosis sp valvotomy        Shortness of breath    Status post patch closure of ASD     Past Surgical History:  Procedure Laterality Date   ABLATION  06/29/2013   s/p isthmus ablation for atrial flutter by Dr Rayann Heman   AORTIC VALVE REPAIR     ATRIAL FLUTTER ABLATION N/A 06/29/2013   Procedure: ATRIAL FLUTTER ABLATION;  Surgeon: Thompson Grayer, MD;  Location: [facility] CATH LAB;  Service: Cardiovascular;  Laterality: N/A;   CARDIAC CATHETERIZATION N/A 06/27/2015   Procedure: Temporary Pacemaker;  Surgeon: Sanda Klein, MD;  Location: [place] CV LAB;  Service: Cardiovascular;  Laterality: N/A;   CARDIOVERSION N/A 03/24/2013   Procedure: CARDIOVERSION;  Surgeon: Sanda Klein, MD;  Location: [facility] ENDOSCOPY;  Service: Cardiovascular;  Laterality: N/A;   COLONOSCOPY WITH PROPOFOL Left 03/08/2020   Procedure: COLONOSCOPY WITH PROPOFOL;  Surgeon: Arta Silence, MD;  Location: [place];  Service: Endoscopy;  Laterality: Left;   EP IMPLANTABLE DEVICE N/A 06/27/2015   Procedure: PPM Generator Changeout;  Surgeon: Sanda Klein, MD;  Location: [place] CV LAB;  Service: Cardiovascular;  Laterality: N/A;    ESOPHAGOGASTRODUODENOSCOPY (EGD) WITH PROPOFOL Left 03/07/2020   Procedure: ESOPHAGOGASTRODUODENOSCOPY (EGD) WITH PROPOFOL;  Surgeon: Arta Silence, MD;  Location: [facility] ENDOSCOPY;  Service: Endoscopy;  Laterality: Left;   PACEMAKER INSERTION  2007   Medtronic Adapta ADDRL1, serial # [id number]   pulmonary valvotomy  1985   trigeminal sx     left side    Family History  Problem Relation Age of Onset   Heart failure Mother    Stroke Father    Multiple sclerosis Daughter     Social History   Socioeconomic History   Marital status: Widowed    Spouse name: Not on file   Number of children: 2   Years of education: Not on file   Highest education level: Not on file  Occupational History   Occupation: retired/homemaker  Tobacco Use   Smoking status: Never Smoker   Smokeless tobacco: Never Used  Scientific laboratory technician Use: Never used  Substance and Sexual Activity   Alcohol use: No   Drug use: No   Sexual activity: Never  Other Topics Concern   Not on file  Social History Narrative   Not on file   Social Determinants of Health   Financial Resource Strain: Not on file  Food Insecurity: Not on file  Transportation Needs: Not on file  Physical Activity: Not on file  Stress: Not on file  Social Connections: Not on  file  Intimate Partner Violence: Not on file    Outpatient Medications Prior to Visit  Medication Sig Dispense Refill   acetaminophen (TYLENOL) 650 MG CR tablet Take 1,300 mg by mouth daily as needed for pain.      bacitracin ophthalmic ointment      BYSTOLIC 10 MG tablet TAKE 1 TABLET BY MOUTH EVERY DAY 90 tablet 3   calcium-vitamin D (OSCAL) 250-125 MG-UNIT per tablet Take 1 tablet by mouth daily.     candesartan (ATACAND) 8 MG tablet TAKE 1 TABLET BY MOUTH EVERY DAY 90 tablet 3   clonazePAM (KLONOPIN) 0.5 MG tablet Take 0.25 mg by mouth at bedtime.      dorzolamide-timolol (COSOPT) 22.3-6.8 MG/ML ophthalmic solution Place 2 drops into  both eyes 2 (two) times daily.     furosemide (LASIX) 40 MG tablet Take 1 tablet (40 mg total) by mouth 2 (two) times daily. 60 tablet 11   latanoprost (XALATAN) 0.005 % ophthalmic solution Place 1 drop into both eyes at bedtime.     Multiple Vitamins-Minerals (MULTIVITAMIN PO) Take 1 tablet by mouth daily.      potassium chloride (KLOR-CON) 10 MEQ tablet Take 2 tablets (20 mEq total) by mouth 2 (two) times daily. 120 tablet 11   RHOPRESSA 0.02 % SOLN Place 1 drop into the right eye every other day.     Rivaroxaban (XARELTO) 15 MG TABS tablet Take 1 tablet (15 mg total) by mouth daily with supper. 30 tablet 6   rosuvastatin (CRESTOR) 10 MG tablet Take 10 mg by mouth at bedtime.     traMADol (ULTRAM) 50 MG tablet Take 1 tablet (50 mg total) by mouth every 12 (twelve) hours as needed. 10 tablet 0   venlafaxine XR (EFFEXOR-XR) 75 MG 24 hr capsule Take 75 mg by mouth daily.     omeprazole (PRILOSEC) 40 MG capsule Take 1 capsule (40 mg total) by mouth daily. (Patient taking differently: Take 20 mg by mouth daily.) 90 capsule 3   No facility-administered medications prior to visit.    Allergies  Allergen Reactions   Sulfa Antibiotics Other (See Comments)    welps    Hydrocodone Nausea Only    nausea    ROS Review of Systems  Constitutional: Negative.   Respiratory: Negative.   Cardiovascular: Negative.   Genitourinary: Positive for dysuria.  Skin: Positive for wound.       Right ankle wound      Objective:    Physical Exam Constitutional:      Appearance: She is ill-appearing.  Cardiovascular:     Rate and Rhythm: Normal rate and regular rhythm.     Pulses: Normal pulses.     Heart sounds: Normal heart sounds.  Pulmonary:     Effort: Pulmonary effort is normal.     Breath sounds: Normal breath sounds.  Skin:    Comments: Right lateral malleoulus stg 2 pressure wound; size of dime, yellow wound bed  Neurological:     Mental Status: She is alert.  Psychiatric:         Mood and Affect: Mood normal.        Behavior: Behavior normal.        Thought Content: Thought content normal.        Judgment: Judgment normal.     BP 113/73    Pulse 75    Temp 98.5 F (36.9 C)    Resp 20    Ht 5' 2"  (1.575 m)    Wt  161 lb (73 kg)    SpO2 97%    BMI 29.45 kg/m  Wt Readings from Last 3 Encounters:  03/07/21 161 lb (73 kg)  02/28/21 163 lb (73.9 kg)  12/16/20 165 lb (74.8 kg)     Health Maintenance Due  Topic Date Due   TETANUS/TDAP  Never done   DEXA SCAN  Never done   PNA vac Low Risk Adult (1 of 2 - PCV13) Never done    There are no preventive care reminders to display for this patient.  Lab Results  Component Value Date   TSH 4.720 (H) 06/03/2020   Lab Results  Component Value Date   WBC 7.4 02/28/2021   HGB 12.2 02/28/2021   HCT 39.1 02/28/2021   MCV 88 02/28/2021   PLT 220 02/28/2021   Lab Results  Component Value Date   NA 138 02/28/2021   K 4.5 02/28/2021   CO2 24 02/28/2021   GLUCOSE 112 (H) 02/28/2021   BUN 17 02/28/2021   CREATININE 1.25 (H) 02/28/2021   BILITOT 0.7 02/28/2021   ALKPHOS 87 02/28/2021   AST 15 02/28/2021   ALT 10 02/28/2021   PROT 7.1 02/28/2021   ALBUMIN 4.5 02/28/2021   CALCIUM 9.6 02/28/2021   ANIONGAP 11 03/08/2020   GFR 47.07 (L) 06/26/2013   Lab Results  Component Value Date   CHOL 129 02/28/2021   Lab Results  Component Value Date   HDL 54 02/28/2021   Lab Results  Component Value Date   LDLCALC 51 02/28/2021   Lab Results  Component Value Date   TRIG 141 02/28/2021   Lab Results  Component Value Date   CHOLHDL 2.2 06/03/2020   Lab Results  Component Value Date   HGBA1C 6.3 (H) 02/28/2021      Assessment & Plan:   Problem List Items Addressed This Visit      Cardiovascular and Mediastinum   High blood pressure    -BP well controlled      Relevant Orders   CMP14+EGFR   CBC with Differential/Platelet   Lipid Panel With LDL/HDL Ratio     Musculoskeletal and  Integument   Pressure ulcer of right ankle, stage 2 ([place])    -she tried bandage and dressing, but she is kicking this off at night -Rx. Boot for offloading        Genitourinary   Stage 3b chronic kidney disease ([place])    Lab Results  Component Value Date   CREATININE 1.25 (H) 02/28/2021   BUN 17 02/28/2021   NA 138 02/28/2021   K 4.5 02/28/2021   CL 99 02/28/2021   CO2 24 02/28/2021   -increase water intake -stable from previous labs      UTI (urinary tract infection)     Other   Prediabetes - Primary   Relevant Orders   CMP14+EGFR   CBC with Differential/Platelet   Lipid Panel With LDL/HDL Ratio   Hemoglobin A1c   Microalbumin / creatinine urine ratio      Meds ordered this encounter  Medications   omeprazole (PRILOSEC) 20 MG capsule    Sig: Take 1 capsule (20 mg total) by mouth daily.    Dispense:  90 capsule    Refill:  1   UNABLE TO FIND    Sig: Med Name: Heel protector boot for right ankle Apply at bedtime to promote healing of your pressure ulcer.    Dispense:  1 each    Refill:  0    Prevalon  boot is appropriate   DISCONTD: ciprofloxacin (CIPRO) 500 MG tablet    Sig: Take 1 tablet (500 mg total) by mouth 2 (two) times daily.    Dispense:  6 tablet    Refill:  0   ciprofloxacin (CIPRO) 500 MG tablet    Sig: Take 1 tablet (500 mg total) by mouth 2 (two) times daily.    Dispense:  10 tablet    Refill:  0    Disregard previous cipro rx    Follow-up: Return in about 3 months (around 06/07/2021) for Lab follow-up.    Noreene Larsson, NP

## 2021-03-07 NOTE — Assessment & Plan Note (Signed)
Lab Results  Component Value Date   CREATININE 1.25 (H) 02/28/2021   BUN 17 02/28/2021   NA 138 02/28/2021   K 4.5 02/28/2021   CL 99 02/28/2021   CO2 24 02/28/2021   -increase water intake -stable from previous labs

## 2021-03-07 NOTE — Assessment & Plan Note (Signed)
BP well controlled.

## 2021-03-07 NOTE — Assessment & Plan Note (Signed)
-  she tried bandage and dressing, but she is kicking this off at night -Rx. Boot for offloading

## 2021-03-10 ENCOUNTER — Ambulatory Visit: Payer: Medicare Other | Admitting: Nurse Practitioner

## 2021-03-10 LAB — URINE CULTURE

## 2021-03-10 NOTE — Progress Notes (Signed)
U/A is negative, so no need to change or add medications. No UTI detected.

## 2021-03-17 ENCOUNTER — Ambulatory Visit (INDEPENDENT_AMBULATORY_CARE_PROVIDER_SITE_OTHER): Payer: Medicare Other | Admitting: Podiatry

## 2021-03-17 ENCOUNTER — Other Ambulatory Visit: Payer: Self-pay

## 2021-03-17 ENCOUNTER — Encounter: Payer: Self-pay | Admitting: Podiatry

## 2021-03-17 DIAGNOSIS — L89512 Pressure ulcer of right ankle, stage 2: Secondary | ICD-10-CM | POA: Diagnosis not present

## 2021-03-17 NOTE — Progress Notes (Signed)
Subjective:   Patient ID: [name] Baird, female   DOB: 87 y.o.   MRN: [id number]   HPI Patient presents with caregiver with a ulceration of the right ankle that is very tender at nighttime and is pressure and causes her to lose sleep and is not bad during the day with no pressure on it.  States is been present for around 3 months and patient does not smoke and is not currently active   Review of Systems  All other systems reviewed and are negative.       Objective:  Physical Exam Vitals and nursing note reviewed.  Constitutional:      Appearance: She is well-developed.  Pulmonary:     Effort: Pulmonary effort is normal.  Musculoskeletal:        General: Normal range of motion.  Skin:    General: Skin is warm.  Neurological:     Mental Status: She is alert.     Vascular status moderately diminished with pulses that are palpable but diminished with patient found to have diminished sharp dull vibratory good range of motion and no muscle strength loss.  On the right ankle there is a approximate 6 x 6 mm discoloration with stress against the skin localized with no subcutaneous exposure no proximal edema erythema drainage and directly on the lateral malleolus     Assessment:  This is most likely secondary to pressure and probable trauma that occurred to the ankle with a area that has no subcutaneous tissue or protection for the ankle bone     Plan:  H&P spent a great deal of time educating them on this and the fact that is venous most likely in nature and will get a try padding around the area with a fleece booty and cushion.  If any breakdown of tissue were to occur it may require wound care products or antibiotics and I have advised them on this fact.  Patient will be seen back for [place] to recheck is encouraged to call with any questions or changes which may occur

## 2021-04-17 ENCOUNTER — Other Ambulatory Visit: Payer: Self-pay | Admitting: Internal Medicine

## 2021-04-18 ENCOUNTER — Other Ambulatory Visit: Payer: Self-pay

## 2021-04-18 ENCOUNTER — Ambulatory Visit (INDEPENDENT_AMBULATORY_CARE_PROVIDER_SITE_OTHER): Payer: Medicare Other | Admitting: Podiatry

## 2021-04-18 DIAGNOSIS — L89512 Pressure ulcer of right ankle, stage 2: Secondary | ICD-10-CM

## 2021-04-18 MED ORDER — DOXYCYCLINE HYCLATE 100 MG PO TABS: 100.0000 mg | ORAL_TABLET | Freq: Two times a day (BID) | ORAL | 2 refills | Status: DC

## 2021-04-18 NOTE — Progress Notes (Signed)
Subjective:   Patient ID: [name] Baird, female   DOB: 87 y.o.   MRN: [id number]   HPI Patient presents with continued abrasion right ankle states it still sore they are just worried about the redness and the fact that sleeping is difficult when she turns over   ROS      Objective:  Physical Exam  Neurovascular status intact with approximate 5 x 5 mm irritation on the right ankle bone that is localized mild redness around the area no active drainage no subcutaneous exposure     Assessment:  Abrasion secondary to bone pressure with irritation of the tissue formation and pain associated with     Plan:  H&P spent a great deal of time discussing this with her and caregiver and I do not see a good solution except for trying to offload weight I am going to start her on antibiotic in case there is any bacteria presence that may be part of the irritated process.  Patient will be seen back as needed for any breakdown or any exposure should occur or drainage

## 2021-04-24 ENCOUNTER — Telehealth: Payer: Self-pay

## 2021-04-24 ENCOUNTER — Other Ambulatory Visit: Payer: Self-pay | Admitting: Nurse Practitioner

## 2021-04-24 DIAGNOSIS — Z1231 Encounter for screening mammogram for malignant neoplasm of breast: Secondary | ICD-10-CM

## 2021-04-24 NOTE — Telephone Encounter (Signed)
Received a TC from patient's daughter who is asking why there is a notation in her mother's (patient) mychart account that a RX was denied on 04/17/21 from one of our physicians.  Daughter states mother is not a patient here.  Per chart review, RX request was received from [facility] on 04/17/21 and was denied d/t prescriber not at this practice/patient not a patient at this practice.  Daughter encouraged to call [facility].  Daughter verbalized understanding and appreciation. SChaplin, RN,BSN

## 2021-05-12 ENCOUNTER — Other Ambulatory Visit: Payer: Self-pay

## 2021-05-12 ENCOUNTER — Ambulatory Visit (INDEPENDENT_AMBULATORY_CARE_PROVIDER_SITE_OTHER): Payer: Medicare Other

## 2021-05-12 VITALS — Ht 62.0 in | Wt 160.0 lb

## 2021-05-12 DIAGNOSIS — Z0000 Encounter for general adult medical examination without abnormal findings: Secondary | ICD-10-CM | POA: Diagnosis not present

## 2021-05-12 NOTE — Patient Instructions (Addendum)
[name] Baird , Thank you for taking time to come for your Medicare Wellness Visit. I appreciate your ongoing commitment to your health goals. Please review the following plan we discussed and let me know if I can assist you in the future.   Screening recommendations/referrals: Colonoscopy: Completed Mammogram: Scheduled for 06/17/21 Bone Density: Completed Recommended yearly ophthalmology/optometry visit for glaucoma screening and checkup Recommended yearly dental visit for hygiene and checkup  Vaccinations: Influenza vaccine: Fall 2022 Pneumococcal vaccine: Declined Tdap vaccine: Declined Shingles vaccine: Complete  Advanced directives: POA and Living Will  Conditions/risks identified: None  Next appointment:  06/13/21 @ 9am   Preventive Care 65 Years and Older, Female Preventive care refers to lifestyle choices and visits with your health care provider that can promote health and wellness. What does preventive care include?  A yearly physical exam. This is also called an annual well check.  Dental exams once or twice a year.  Routine eye exams. Ask your health care provider how often you should have your eyes checked.  Personal lifestyle choices, including:  Daily care of your teeth and gums.  Regular physical activity.  Eating a healthy diet.  Avoiding tobacco and drug use.  Limiting alcohol use.  Practicing safe sex.  Taking low-dose aspirin every day.  Taking vitamin and mineral supplements as recommended by your health care provider. What happens during an annual well check? The services and screenings done by your health care provider during your annual well check will depend on your age, overall health, lifestyle risk factors, and family history of disease. Counseling  Your health care provider may ask you questions about your:  Alcohol use.  Tobacco use.  Drug use.  Emotional well-being.  Home and relationship well-being.  Sexual  activity.  Eating habits.  History of falls.  Memory and ability to understand (cognition).  Work and work Astronomer.  Reproductive health. Screening  You may have the following tests or measurements:  Height, weight, and BMI.  Blood pressure.  Lipid and cholesterol levels. These may be checked every 5 years, or more frequently if you are over 73 years old.  Skin check.  Lung cancer screening. You may have this screening every year starting at age 80 if you have a 30-pack-year history of smoking and currently smoke or have quit within the past 15 years.  Fecal occult blood test (FOBT) of the stool. You may have this test every year starting at age 81.  Flexible sigmoidoscopy or colonoscopy. You may have a sigmoidoscopy every 5 years or a colonoscopy every 10 years starting at age 14.  Hepatitis C blood test.  Hepatitis B blood test.  Sexually transmitted disease (STD) testing.  Diabetes screening. This is done by checking your blood sugar (glucose) after you have not eaten for a while (fasting). You may have this done every 1-3 years.  Bone density scan. This is done to screen for osteoporosis. You may have this done starting at age 13.  Mammogram. This may be done every 1-2 years. Talk to your health care provider about how often you should have regular mammograms. Talk with your health care provider about your test results, treatment options, and if necessary, the need for more tests. Vaccines  Your health care provider may recommend certain vaccines, such as:  Influenza vaccine. This is recommended every year.  Tetanus, diphtheria, and acellular pertussis (Tdap, Td) vaccine. You may need a Td booster every 10 years.  Zoster vaccine. You may need this after age 9.  Pneumococcal 13-valent conjugate (PCV13) vaccine. One dose is recommended after age 83.  Pneumococcal polysaccharide (PPSV23) vaccine. One dose is recommended after age 48. Talk to your health care  provider about which screenings and vaccines you need and how often you need them. This information is not intended to replace advice given to you by your health care provider. Make sure you discuss any questions you have with your health care provider. Document Released: 01/03/2016 Document Revised: 08/26/2016 Document Reviewed: 10/08/2015 Elsevier Interactive Patient Education  2017 [place] Prevention in the Home Falls can cause injuries. They can happen to people of all ages. There are many things you can do to make your home safe and to help prevent falls. What can I do on the outside of my home?  Regularly fix the edges of walkways and driveways and fix any cracks.  Remove anything that might make you trip as you walk through a door, such as a raised step or threshold.  Trim any bushes or trees on the path to your home.  Use bright outdoor lighting.  Clear any walking paths of anything that might make someone trip, such as rocks or tools.  Regularly check to see if handrails are loose or broken. Make sure that both sides of any steps have handrails.  Any raised decks and porches should have guardrails on the edges.  Have any leaves, snow, or ice cleared regularly.  Use sand or salt on walking paths during winter.  Clean up any spills in your garage right away. This includes oil or grease spills. What can I do in the bathroom?  Use night lights.  Install grab bars by the toilet and in the tub and shower. Do not use towel bars as grab bars.  Use non-skid mats or decals in the tub or shower.  If you need to sit down in the shower, use a plastic, non-slip stool.  Keep the floor dry. Clean up any water that spills on the floor as soon as it happens.  Remove soap buildup in the tub or shower regularly.  Attach bath mats securely with double-sided non-slip rug tape.  Do not have throw rugs and other things on the floor that can make you trip. What can I do in  the bedroom?  Use night lights.  Make sure that you have a light by your bed that is easy to reach.  Do not use any sheets or blankets that are too big for your bed. They should not hang down onto the floor.  Have a firm chair that has side arms. You can use this for support while you get dressed.  Do not have throw rugs and other things on the floor that can make you trip. What can I do in the kitchen?  Clean up any spills right away.  Avoid walking on wet floors.  Keep items that you use a lot in easy-to-reach places.  If you need to reach something above you, use a strong step stool that has a grab bar.  Keep electrical cords out of the way.  Do not use floor polish or wax that makes floors slippery. If you must use wax, use non-skid floor wax.  Do not have throw rugs and other things on the floor that can make you trip. What can I do with my stairs?  Do not leave any items on the stairs.  Make sure that there are handrails on both sides of the stairs and use them.  Fix handrails that are broken or loose. Make sure that handrails are as long as the stairways.  Check any carpeting to make sure that it is firmly attached to the stairs. Fix any carpet that is loose or worn.  Avoid having throw rugs at the top or bottom of the stairs. If you do have throw rugs, attach them to the floor with carpet tape.  Make sure that you have a light switch at the top of the stairs and the bottom of the stairs. If you do not have them, ask someone to add them for you. What else can I do to help prevent falls?  Wear shoes that:  Do not have high heels.  Have rubber bottoms.  Are comfortable and fit you well.  Are closed at the toe. Do not wear sandals.  If you use a stepladder:  Make sure that it is fully opened. Do not climb a closed stepladder.  Make sure that both sides of the stepladder are locked into place.  Ask someone to hold it for you, if possible.  Clearly mark and  make sure that you can see:  Any grab bars or handrails.  First and last steps.  Where the edge of each step is.  Use tools that help you move around (mobility aids) if they are needed. These include:  Canes.  Walkers.  Scooters.  Crutches.  Turn on the lights when you go into a dark area. Replace any light bulbs as soon as they burn out.  Set up your furniture so you have a clear path. Avoid moving your furniture around.  If any of your floors are uneven, fix them.  If there are any pets around you, be aware of where they are.  Review your medicines with your doctor. Some medicines can make you feel dizzy. This can increase your chance of falling. Ask your doctor what other things that you can do to help prevent falls. This information is not intended to replace advice given to you by your health care provider. Make sure you discuss any questions you have with your health care provider. Document Released: 10/03/2009 Document Revised: 05/14/2016 Document Reviewed: 01/11/2015 Elsevier Interactive Patient Education  2017 [employer].

## 2021-05-12 NOTE — Progress Notes (Signed)
Subjective:   [name] Baird is a 87 y.o. female who presents for Medicare Annual (Subsequent) preventive examination.  Review of Systems       Objective:    There were no vitals filed for this visit. There is no height or weight on file to calculate BMI.  Advanced Directives 06/27/2015 06/29/2013  Does Patient Have a Medical Advance Directive? Yes Patient has advance directive, copy not in chart  Does patient want to make changes to medical advance directive? No - Patient declined -  Copy of Healthcare Power of Attorney in Chart? Yes -    Current Medications (verified) Outpatient Encounter Medications as of 05/12/2021  Medication Sig  . acetaminophen (TYLENOL) 650 MG CR tablet Take 1,300 mg by mouth daily as needed for pain.   . bacitracin ophthalmic ointment   . BYSTOLIC 10 MG tablet TAKE 1 TABLET BY MOUTH EVERY DAY  . calcium-vitamin D (OSCAL) 250-125 MG-UNIT per tablet Take 1 tablet by mouth daily.  . candesartan (ATACAND) 8 MG tablet TAKE 1 TABLET BY MOUTH EVERY DAY  . ciprofloxacin (CIPRO) 500 MG tablet Take 1 tablet (500 mg total) by mouth 2 (two) times daily.  . clonazePAM (KLONOPIN) 0.5 MG tablet Take 0.25 mg by mouth at bedtime.   . dorzolamide-timolol (COSOPT) 22.3-6.8 MG/ML ophthalmic solution Place 2 drops into both eyes 2 (two) times daily.  [name] Kitchen. doxycycline (VIBRA-TABS) 100 MG tablet Take 1 tablet (100 mg total) by mouth 2 (two) times daily.  . furosemide (LASIX) 40 MG tablet Take 1 tablet (40 mg total) by mouth 2 (two) times daily.  [name] Kitchen. latanoprost (XALATAN) 0.005 % ophthalmic solution Place 1 drop into both eyes at bedtime.  . Multiple Vitamins-Minerals (MULTIVITAMIN PO) Take 1 tablet by mouth daily.   [name] Kitchen. omeprazole (PRILOSEC) 20 MG capsule Take 1 capsule (20 mg total) by mouth daily.  . potassium chloride (KLOR-CON) 10 MEQ tablet Take 2 tablets (20 mEq total) by mouth 2 (two) times daily.  . RHOPRESSA 0.02 % SOLN Place 1 drop into the right eye every other day.   . Rivaroxaban (XARELTO) 15 MG TABS tablet Take 1 tablet (15 mg total) by mouth daily with supper.  . rosuvastatin (CRESTOR) 10 MG tablet Take 10 mg by mouth at bedtime.  . traMADol (ULTRAM) 50 MG tablet Take 1 tablet (50 mg total) by mouth every 12 (twelve) hours as needed.  [name] Kitchen. UNABLE TO FIND Med Name: Heel protector boot for right ankle Apply at bedtime to promote healing of your pressure ulcer.  . venlafaxine XR (EFFEXOR-XR) 75 MG 24 hr capsule Take 75 mg by mouth daily.   No facility-administered encounter medications on file as of 05/12/2021.    Allergies (verified) Sulfa antibiotics and Hydrocodone   History: Past Medical History:  Diagnosis Date  . Acute GI bleeding 03/06/2020  . Anxiety   . Atrial flutter (HCC)    s/p ablation 06/29/2013 by Dr Johney FrameAllred  . CHF (congestive heart failure) (HCC)   . Complete heart block (HCC) 2007   s/p medtronic pacemaker implantation  . Depression   . Glaucoma   . Hypertension   . Pulmonary stenosis sp valvotomy       . Shortness of breath   . Status post patch closure of ASD    Past Surgical History:  Procedure Laterality Date  . ABLATION  06/29/2013   s/p isthmus ablation for atrial flutter by Dr Johney FrameAllred  . AORTIC VALVE REPAIR    . ATRIAL FLUTTER ABLATION N/A  06/29/2013   Procedure: ATRIAL FLUTTER ABLATION;  Surgeon: Hillis Range, MD;  Location[facility] C[facility] LAB;  Service: Cardiovascular;  Laterality: N/A;  . CARDIAC CATHETERIZATION N/A 06/27/2015   Procedure: Temporary Pacemaker;  Surgeon: Thurmon Fair, MD;  Location: [facility] INVASIVE CV LAB;  Service: Cardiovascular;  Laterality: N/A;  . CARDIOVERSION N/A 03/24/2013   Procedure: CARDIOVERSION;  Surgeon: Thurmon Fair, MD;  Location: [facility] ENDOSCOPY;  Service: Cardiovascular;  Laterality: N/A;  . COLONOSCOPY WITH PROPOFOL Left 03/08/2020   Procedure: COLONOSCOPY WITH PROPOFOL;  Surgeon: Willis Modena, MD;  Location[facility] ENDOSCOPY;  Service: Endoscopy;  Laterality: Left;  . EP IMPLANTABLE DEVICE N/A  06/27/2015   Procedure: PPM Generator Changeout;  Surgeon: Thurmon Fair, MD;  Location: [facility] INVASIVE CV LAB;  Service: Cardiovascular;  Laterality: N/A;  . ESOPHAGOGASTRODUODENOSCOPY (EGD) WITH PROPOFOL Left 03/07/2020   Procedure: ESOPHAGOGASTRODUODENOSCOPY (EGD) WITH PROPOFOL;  Surgeon: Willis Modena, MD;  Location: [facility] ENDOSCOPY;  Service: Endoscopy;  Laterality: Left;  . PACEMAKER INSERTION  2007   Medtronic Adapta ADDRL1, serial # [id number]  . pulmonary valvotomy  1985  . trigeminal sx     left side   Family History  Problem Relation Age of Onset  . Heart failure Mother   . Stroke Father   . Multiple sclerosis Daughter    Social History   Socioeconomic History  . Marital status: Widowed    Spouse name: Not on file  . Number of children: 2  . Years of education: Not on file  . Highest education level: Not on file  Occupational History  . Occupation: retired/homemaker  Tobacco Use  . Smoking status: Never Smoker  . Smokeless tobacco: Never Used  Vaping Use  . Vaping Use: Never used  Substance and Sexual Activity  . Alcohol use: No  . Drug use: No  . Sexual activity: Never  Other Topics Concern  . Not on file  Social History Narrative  . Not on file   Social Determinants of Health   Financial Resource Strain: Not on file  Food Insecurity: Not on file  Transportation Needs: Not on file  Physical Activity: Not on file  Stress: Not on file  Social Connections: Not on file    Tobacco Counseling Counseling given: Not Answered   Clinical Intake:                 Diabetic? no         Activities of Daily Living No flowsheet data found.  Patient Care Team: Heather Roberts, NP as PCP - General (Nurse Practitioner) Thurmon Fair, MD as PCP - Cardiology (Cardiology)  Indicate any recent Medical Services you may have received from other than [facility] providers in the past year (date may be approximate).     Assessment:   This is a routine wellness  examination for [facility].  Hearing/Vision screen No exam data present  Dietary issues and exercise activities discussed:    Goals Addressed   None    Depression Screen PHQ 2/9 Scores 03/07/2021 02/28/2021  PHQ - 2 Score 0 0    Fall Risk Fall Risk  03/07/2021 02/28/2021  Falls in the past year? 0 0  Number falls in past yr: 0 0  Injury with Fall? 0 0  Risk for fall due to : No Fall Risks No Fall Risks  Follow up Falls evaluation completed Falls evaluation completed    FALL RISK PREVENTION PERTAINING TO THE HOME:  Any stairs in or around the home? Yes  If so, are  there any without handrails? Yes  Home free of loose throw rugs in walkways, pet beds, electrical cords, etc? Yes  Adequate lighting in your home to reduce risk of falls? Yes   ASSISTIVE DEVICES UTILIZED TO PREVENT FALLS:  Life alert? Yes  Use of a cane, walker or w/c? Yes  Grab bars in the bathroom? Yes  Shower chair or bench in shower? Yes  Elevated toilet seat or a handicapped toilet? Yes   TIMED UP AND GO:  Was the test performed? No .  Length of time to ambulate n/a     Cognitive Function:        Immunizations Immunization History  Administered Date(s) Administered  . Influenza,inj,Quad PF,6+ Mos 10/27/2016  . PFIZER(Purple Top)SARS-COV-2 Vaccination 12/26/2019, 01/16/2020    TDAP status: Due, Education has been provided regarding the importance of this vaccine. Advised may receive this vaccine at local pharmacy or Health Dept. Aware to provide a copy of the vaccination record if obtained from local pharmacy or Health Dept. Verbalized acceptance and understanding.  Flu Vaccine status: Declined, Education has been provided regarding the importance of this vaccine but patient still declined. Advised may receive this vaccine at local pharmacy or Health Dept. Aware to provide a copy of the vaccination record if obtained from local pharmacy or Health Dept. Verbalized acceptance and  understanding.  Pneumococcal vaccine status: Declined,  Education has been provided regarding the importance of this vaccine but patient still declined. Advised may receive this vaccine at local pharmacy or Health Dept. Aware to provide a copy of the vaccination record if obtained from local pharmacy or Health Dept. Verbalized acceptance and understanding.   Covid-19 vaccine status: Completed vaccines  Qualifies for Shingles Vaccine? Yes   Zostavax completed Yes   Shingrix Completed?: No.    Education has been provided regarding the importance of this vaccine. Patient has been advised to call insurance company to determine out of pocket expense if they have not yet received this vaccine. Advised may also receive vaccine at local pharmacy or Health Dept. Verbalized acceptance and understanding.  Screening Tests Health Maintenance  Topic Date Due  . TETANUS/TDAP  Never done  . DEXA SCAN  Never done  . PNA vac Low Risk Adult (1 of 2 - PCV13) Never done  . INFLUENZA VACCINE  07/21/2021  . COVID-19 Vaccine  Completed  . HPV VACCINES  Aged Out    Health Maintenance  Health Maintenance Due  Topic Date Due  . TETANUS/TDAP  Never done  . DEXA SCAN  Never done  . PNA vac Low Risk Adult (1 of 2 - PCV13) Never done    Colorectal Screening: 3/21   Mammogram: Scheduled 06/17/21   Bone Dexa: Declined  Lung Cancer Screening: (Low Dose CT Chest recommended if Age 31-80 years, 30 pack-year currently smoking OR have quit w/in 15years.) does not qualify.   Lung Cancer Screening Referral: n/a  Additional Screening:  Hepatitis C Screening: does not qualify; Completed  Vision Screening: Recommended annual ophthalmology exams for early detection of glaucoma and other disorders of the eye. Is the patient up to date with their annual eye exam?  Yes  Who is the provider or what is the name of the office in which the patient attends annual eye exams? [facility] If pt is not established with a  provider, would they like to be referred to a provider to establish care? n/a.   Dental Screening: Recommended annual dental exams for proper oral hygiene  Community Resource Referral /  Chronic Care Management: CRR required this visit?  No   CCM required this visit?  No      Plan:     I have personally reviewed and noted the following in the patient's chart:   . Medical and social history . Use of alcohol, tobacco or illicit drugs  . Current medications and supplements including opioid prescriptions.  . Functional ability and status . Nutritional status . Physical activity . Advanced directives . List of other physicians . Hospitalizations, surgeries, and ER visits in previous 12 months . Vitals . Screenings to include cognitive, depression, and falls . Referrals and appointments  In addition, I have reviewed and discussed with patient certain preventive protocols, quality metrics, and best practice recommendations. A written personalized care plan for preventive services as well as general preventive health recommendations were provided to patient.     Jerilynn Mages, [place]   4/31/5400   Nurse Notes: AWV completed by nurse in office by phone. Patient gave consent to telehealth visit via audio. Patient daughter also on the phone during this visit listening in. Patient and daughter at their home at the time of this visit. Provider here in the office at the time of this visit. Visit took 30 minutes to complete.

## 2021-05-14 ENCOUNTER — Other Ambulatory Visit: Payer: Self-pay | Admitting: Cardiovascular Disease

## 2021-05-14 ENCOUNTER — Other Ambulatory Visit: Payer: Self-pay | Admitting: Internal Medicine

## 2021-05-16 ENCOUNTER — Other Ambulatory Visit: Payer: Self-pay | Admitting: Cardiovascular Disease

## 2021-05-16 NOTE — Telephone Encounter (Signed)
Prescription refill request for Xarelto received.  Indication:atrial fib Last office visit:12/21 croitoru Weight:72.6 kg Age:87 Scr:1.2 CrCl:38.57 ml/min  Prescription refilled

## 2021-05-20 ENCOUNTER — Other Ambulatory Visit: Payer: Self-pay

## 2021-05-20 ENCOUNTER — Telehealth: Payer: Self-pay

## 2021-05-20 DIAGNOSIS — F320 Major depressive disorder, single episode, mild: Secondary | ICD-10-CM

## 2021-05-20 DIAGNOSIS — E7800 Pure hypercholesterolemia, unspecified: Secondary | ICD-10-CM

## 2021-05-20 DIAGNOSIS — F419 Anxiety disorder, unspecified: Secondary | ICD-10-CM

## 2021-05-20 MED ORDER — ROSUVASTATIN CALCIUM 10 MG PO TABS: 10.0000 mg | ORAL_TABLET | Freq: Every day | ORAL | 3 refills | Status: DC

## 2021-05-20 MED ORDER — VENLAFAXINE HCL ER 75 MG PO CP24: 75.0000 mg | ORAL_CAPSULE | Freq: Every day | ORAL | 3 refills | Status: DC

## 2021-05-20 MED ORDER — CLONAZEPAM 0.5 MG PO TABS: 0.2500 mg | ORAL_TABLET | Freq: Every day | ORAL | 1 refills | Status: DC

## 2021-05-20 NOTE — Telephone Encounter (Signed)
Rx's refilled. 

## 2021-05-20 NOTE — Telephone Encounter (Signed)
Patient needing refills on rosuvastatin and venlafaxine and clonazepam send to [facility] on [address] in [place] ph# [phone]

## 2021-05-25 IMAGING — DX DG HIP (WITH OR WITHOUT PELVIS) 2-3V*R*
3 series · 3 of 3 positions shown · non-contrast
Comparison: None.

CLINICAL DATA: Right hip pain with bending

EXAM:
DG HIP (WITH OR WITHOUT PELVIS) 2-3V RIGHT

[pelvis ap]
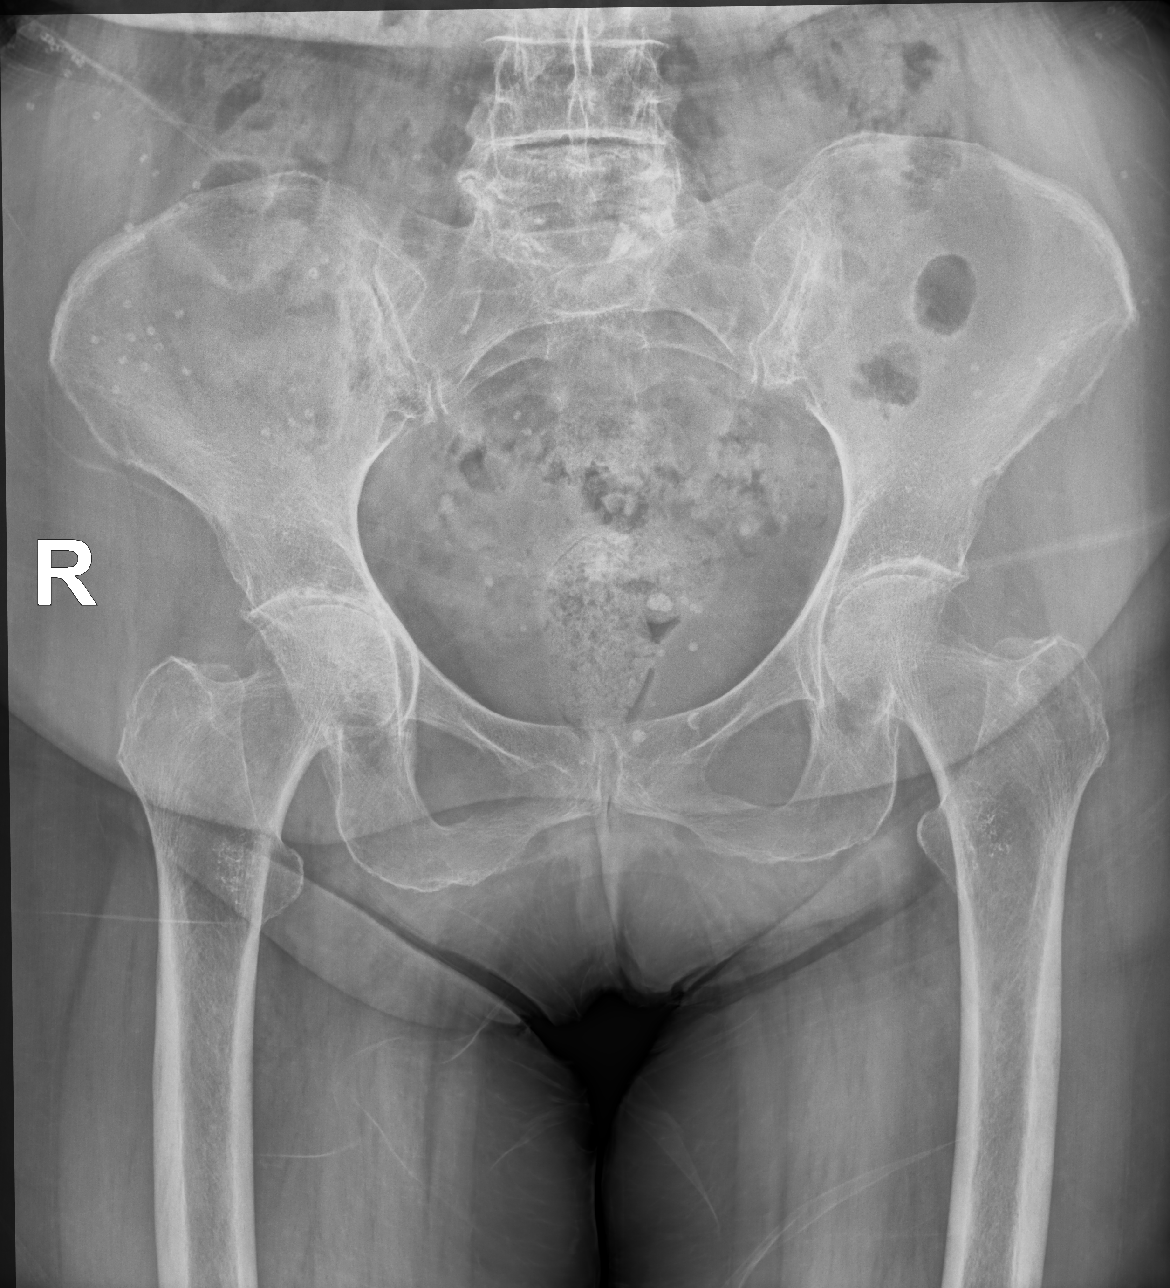

[hip ap]
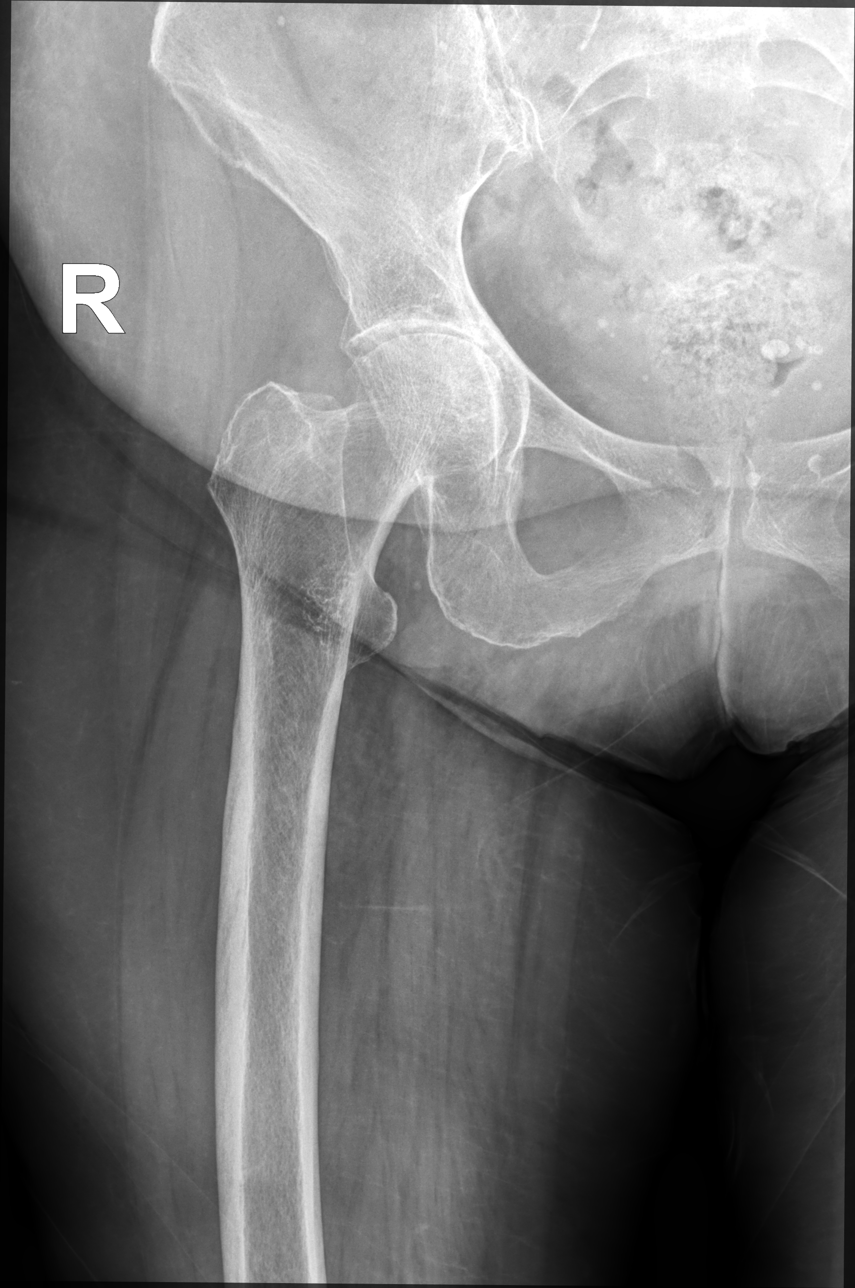

[hip (frog leg)]
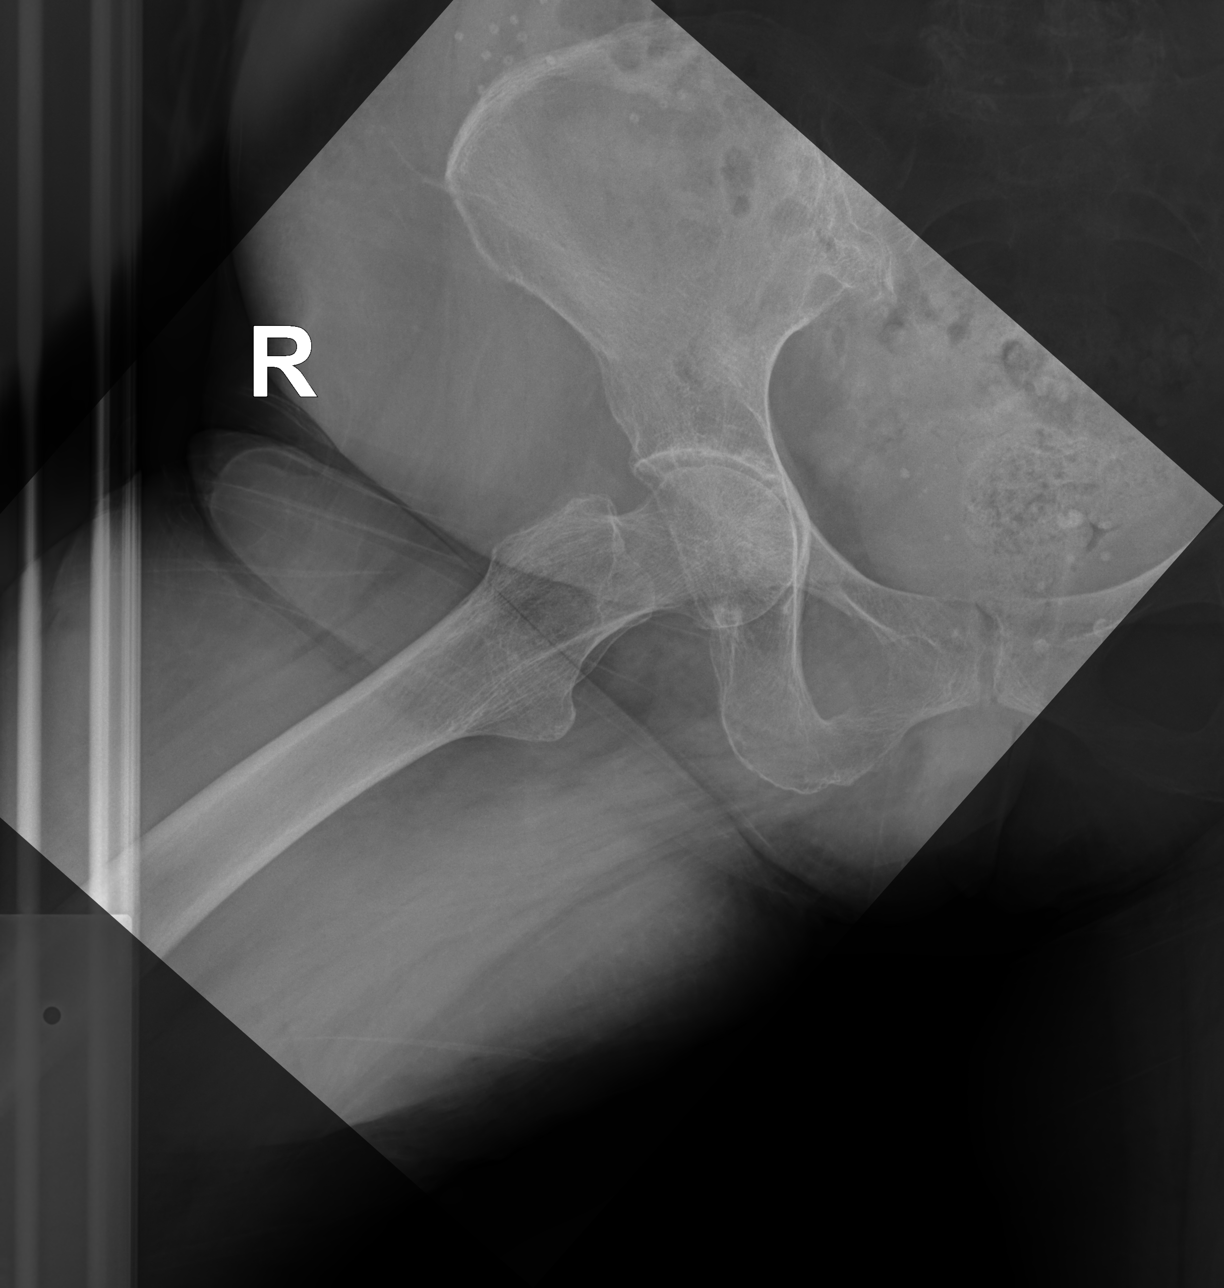

[3 of 3 positions shown; findings below may reference images not displayed]

FINDINGS: Right greater than left degenerative arthropathy of the hips with
joint space loss, sclerosis and bony spurring. Bones are osteopenic.
Right hip appears intact without fracture or malalignment. No
subluxation or dislocation. Degenerative changes of the lumbosacral
spine. Pelvis appears intact.
IMPRESSION: Degenerative changes and osteopenia. No acute osseous finding by
plain radiography.

## 2021-05-26 ENCOUNTER — Encounter: Payer: Self-pay | Admitting: Cardiovascular Disease

## 2021-05-26 ENCOUNTER — Ambulatory Visit (INDEPENDENT_AMBULATORY_CARE_PROVIDER_SITE_OTHER): Payer: Medicare Other | Admitting: Cardiovascular Disease

## 2021-05-26 ENCOUNTER — Other Ambulatory Visit: Payer: Self-pay

## 2021-05-26 VITALS — BP 126/78 | Resp 18 | Ht 62.0 in | Wt 158.0 lb

## 2021-05-26 DIAGNOSIS — E7800 Pure hypercholesterolemia, unspecified: Secondary | ICD-10-CM | POA: Diagnosis not present

## 2021-05-26 DIAGNOSIS — I5032 Chronic diastolic (congestive) heart failure: Secondary | ICD-10-CM | POA: Diagnosis not present

## 2021-05-26 DIAGNOSIS — I10 Essential (primary) hypertension: Secondary | ICD-10-CM

## 2021-05-26 DIAGNOSIS — D649 Anemia, unspecified: Secondary | ICD-10-CM

## 2021-05-26 DIAGNOSIS — I480 Paroxysmal atrial fibrillation: Secondary | ICD-10-CM

## 2021-05-26 DIAGNOSIS — D500 Iron deficiency anemia secondary to blood loss (chronic): Secondary | ICD-10-CM

## 2021-05-26 DIAGNOSIS — Z8774 Personal history of (corrected) congenital malformations of heart and circulatory system: Secondary | ICD-10-CM

## 2021-05-26 DIAGNOSIS — I442 Atrioventricular block, complete: Secondary | ICD-10-CM

## 2021-05-26 DIAGNOSIS — Q221 Congenital pulmonary valve stenosis: Secondary | ICD-10-CM

## 2021-05-26 DIAGNOSIS — I281 Aneurysm of pulmonary artery: Secondary | ICD-10-CM

## 2021-05-26 DIAGNOSIS — Z950 Presence of cardiac pacemaker: Secondary | ICD-10-CM

## 2021-05-26 DIAGNOSIS — Z7901 Long term (current) use of anticoagulants: Secondary | ICD-10-CM

## 2021-05-26 MED ORDER — NEBIVOLOL HCL 10 MG PO TABS: 10.0000 mg | ORAL_TABLET | Freq: Every day | ORAL | 3 refills | Status: DC

## 2021-05-26 NOTE — Patient Instructions (Signed)
Medication Instructions:  No changes *If you need a refill on your cardiac medications before your next appointment, please call your pharmacy*   Lab Work: Labs today  If you have labs (blood work) drawn today and your tests are completely normal, you will receive your results only by: [name] Kitchen MyChart Message (if you have MyChart) OR . A paper copy in the mail If you have any lab test that is abnormal or we need to change your treatment, we will call you to review the results.   Testing/Procedures: None ordered   Follow-Up: At [facility], you and your health needs are our priority.  As part of our continuing mission to provide you with exceptional heart care, we have created designated Provider Care Teams.  These Care Teams include your primary Cardiologist (physician) and Advanced Practice Providers (APPs -  Physician Assistants and Nurse Practitioners) who all work together to provide you with the care you need, when you need it.  We recommend signing up for the patient portal called "MyChart".  Sign up information is provided on this After Visit Summary.  MyChart is used to connect with patients for Virtual Visits (Telemedicine).  Patients are able to view lab/test results, encounter notes, upcoming appointments, etc.  Non-urgent messages can be sent to your provider as well.   To learn more about what you can do with MyChart, go to [email or web address].    Your next appointment:   6 month(s)  The format for your next appointment:   In Person  Provider:   Thurmon Fair, MD

## 2021-05-26 NOTE — Progress Notes (Signed)
ROS  Patient ID: [name] Baird, female   DOB: 10/05/1933, 87 y.o.   MRN: [id number]    Cardiology Office Note    Date:  05/27/2021   ID:  [name] Baird, DOB [birth year], MRN [id number]  PCP:  Heather Roberts, NP  Cardiologist:   Thurmon Fair, MD   No chief complaint on file.   History of Present Illness:  [name] Baird is a 87 y.o. female with a history of congenital heart disease (atrial septal defect status post patch closure), pulmonic stenosis (status post valvotomy 1985), left pulmonary artery aneurysm, complete heart block status post dual-chamber permanent pacemaker implantation (Medtronic, last generator change 2014) and infrequent paroxysmal atrial fibrillation (atrial flutter cavotricuspid isthmus ablation 2014). She has history of diastolic heart failure with infrequent exacerbation, most recently following an episode of lower GI bleeding with severe anemia in March 2021.  Xarelto was switched to Eliquis after that episode of bleeding.  She has done quite well over the last several months as her daughter Lanice Schwab has been very carefully monitoring her sodium intake and her weights daily.  She is only had to administer extra doses of furosemide every couple of weeks or so.  For the most part the patient has remained stable on a regimen of furosemide 80 mg on Monday Wednesday Friday and 40 mg on the other 4 days of the week.  (Her dose of potassium is similarly tailored to 20 mEq on Monday Wednesday Friday and 10 mEq other days of the week).  She has not had more than 1+ ankle edema she denies orthopnea or PND.  She has not had dizziness or syncope, but she did have a fall in her the bathroom about 3 weeks ago.  That is her first fall in the last 12 months.  She has not had focal neurological events or claudication.  In the past we estimated her dry weight around 160 pounds on her home scale (our office scale usually overestimates this by about 3 pounds), but today she  only weighs 158 pounds.  Its possible she may be losing some real weight.  She becomes easily tired but is also extremely sedentary.  Device interrogation shows normal pacemaker function.  She is pacemaker dependent due to complete heart block.  Her Medtronic Adapta dual-chamber device was implanted in 2016 and has roughly 4.5 years of remaining generator longevity.  She has 94% atrial pacing (with good heart rate histogram distribution) and 98.7% ventricular pacing.  She has had very little atrial fibrillation (less than 0.1%).  She had several episodes lasting between 2 and 10 minutes in early April, none in the last couple of months.  Presenting rhythm is AV sequentially paced today.  In May 2019 she saw Dr. Lowanda Foster Zwischenberger at [facility] to discuss treatment for pulmonary artery aneurysm.  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  Conservative management was recommended.  Echo performed there showed a left ventricular ejection fraction of 45-50%, severe left atrial enlargement, moderate regurgitation of all 4 cardiac valves, in addition to the dilated pulmonary artery and left pulmonary artery aneurysm.  In April 2017 she had one episode that lasted for 6 hours.  In March 2018 the atrial fibrillation only lasted for just over 2 minutes.  She had a 7 beat run of nonsustained ventricular tachycardia in March 2020.  She had a flutter ablation performed by Dr. Johney Frame in July 2014 and  is not taking any antiarrhythmics at this time, other than a low dose of beta blocker. At the time of the EP study she was noted to have conventional counterclockwise isthmus-dependent right atrial flutter but then a second type of right atrial flutter was noted briefly. It could not be reinduced for full study. She has done poorly on conventional beta-blockers (atenolol, metoprolol), but tolerates Bystolic well.   Past Medical History:  Diagnosis  Date  . Acute GI bleeding 03/06/2020  . Anxiety   . Atrial flutter (HCC)    s/p ablation 06/29/2013 by Dr Johney Frame  . CHF (congestive heart failure) (HCC)   . Complete heart block (HCC) 2007   s/p medtronic pacemaker implantation  . Depression   . Glaucoma   . Hypertension   . Pulmonary stenosis sp valvotomy       . Shortness of breath   . Status post patch closure of ASD     Past Surgical History:  Procedure Laterality Date  . ABLATION  06/29/2013   s/p isthmus ablation for atrial flutter by Dr Johney Frame  . AORTIC VALVE REPAIR    . ATRIAL FLUTTER ABLATION N/A 06/29/2013   Procedure: ATRIAL FLUTTER ABLATION;  Surgeon: Hillis Range, MD;  Location: [facility] CATH LAB;  Service: Cardiovascular;  Laterality: N/A;  . CARDIAC CATHETERIZATION N/A 06/27/2015   Procedure: Temporary Pacemaker;  Surgeon: Thurmon Fair, MD;  Location: [facility] INVASIVE CV LAB;  Service: Cardiovascular;  Laterality: N/A;  . CARDIOVERSION N/A 03/24/2013   Procedure: CARDIOVERSION;  Surgeon: Thurmon Fair, MD;  Location: [facility] ENDOSCOPY;  Service: Cardiovascular;  Laterality: N/A;  . COLONOSCOPY WITH PROPOFOL Left 03/08/2020   Procedure: COLONOSCOPY WITH PROPOFOL;  Surgeon: Willis Modena, MD;  Location: [facility] ENDOSCOPY;  Service: Endoscopy;  Laterality: Left;  . EP IMPLANTABLE DEVICE N/A 06/27/2015   Procedure: PPM Generator Changeout;  Surgeon: Thurmon Fair, MD;  Location: [facility] INVASIVE CV LAB;  Service: Cardiovascular;  Laterality: N/A;  . ESOPHAGOGASTRODUODENOSCOPY (EGD) WITH PROPOFOL Left 03/07/2020   Procedure: ESOPHAGOGASTRODUODENOSCOPY (EGD) WITH PROPOFOL;  Surgeon: Willis Modena, MD;  Location: [facility] ENDOSCOPY;  Service: Endoscopy;  Laterality: Left;  . PACEMAKER INSERTION  2007   Medtronic Adapta ADDRL1, serial # [id number]  . pulmonary valvotomy  1985  . trigeminal sx     left side    Outpatient Medications Prior to Visit  Medication Sig Dispense Refill  . acetaminophen (TYLENOL) 650 MG CR tablet Take 1,300 mg by mouth daily as  needed for pain.     . bacitracin ophthalmic ointment     . calcium-vitamin D (OSCAL) 250-125 MG-UNIT per tablet Take 1 tablet by mouth daily.    . candesartan (ATACAND) 8 MG tablet TAKE 1 TABLET BY MOUTH EVERY DAY 90 tablet 3  . clonazePAM (KLONOPIN) 0.5 MG tablet Take 0.5 tablets (0.25 mg total) by mouth at bedtime. 30 tablet 1  . dorzolamide-timolol (COSOPT) 22.3-6.8 MG/ML ophthalmic solution Place 2 drops into both eyes 2 (two) times daily.    [name] Kitchen doxycycline (VIBRA-TABS) 100 MG tablet Take 100 mg by mouth 2 (two) times daily.    . furosemide (LASIX) 40 MG tablet Take 1 tablet (40 mg total) by mouth 2 (two) times daily. 60 tablet 11  . latanoprost (XALATAN) 0.005 % ophthalmic solution Place 1 drop into both eyes at bedtime.    . Multiple Vitamins-Minerals (MULTIVITAMIN PO) Take 1 tablet by mouth daily.     [name] Kitchen omeprazole (PRILOSEC) 20 MG capsule Take 1 capsule (20 mg total) by mouth daily. 90 capsule 1  .  potassium chloride (KLOR-CON) 10 MEQ tablet Take 2 tablets (20 mEq total) by mouth 2 (two) times daily. 120 tablet 11  . RHOPRESSA 0.02 % SOLN Place 1 drop into the right eye every other day.    . rosuvastatin (CRESTOR) 10 MG tablet Take 1 tablet (10 mg total) by mouth at bedtime. 30 tablet 3  . traMADol (ULTRAM) 50 MG tablet Take 1 tablet (50 mg total) by mouth every 12 (twelve) hours as needed. 10 tablet 0  . UNABLE TO FIND Med Name: Heel protector boot for right ankle Apply at bedtime to promote healing of your pressure ulcer. 1 each 0  . venlafaxine XR (EFFEXOR-XR) 75 MG 24 hr capsule Take 1 capsule (75 mg total) by mouth daily. 30 capsule 3  . XARELTO 15 MG TABS tablet TAKE 1 TABLET (15 MG TOTAL) BY MOUTH DAILY WITH SUPPER. 90 tablet 1  . BYSTOLIC 10 MG tablet TAKE 1 TABLET BY MOUTH EVERY DAY 90 tablet 3   No facility-administered medications prior to visit.     Allergies:   Sulfa antibiotics and Hydrocodone   Social History   Socioeconomic History  . Marital status: Widowed     Spouse name: Not on file  . Number of children: 2  . Years of education: Not on file  . Highest education level: Not on file  Occupational History  . Occupation: retired/homemaker  Tobacco Use  . Smoking status: Never Smoker  . Smokeless tobacco: Never Used  Vaping Use  . Vaping Use: Never used  Substance and Sexual Activity  . Alcohol use: No  . Drug use: No  . Sexual activity: Never  Other Topics Concern  . Not on file  Social History Narrative  . Not on file   Social Determinants of Health   Financial Resource Strain: Low Risk   . Difficulty of Paying Living Expenses: Not hard at all  Food Insecurity: No Food Insecurity  . Worried About Programme researcher, broadcasting/film/video in the Last Year: Never true  . Ran Out of Food in the Last Year: Never true  Transportation Needs: No Transportation Needs  . Lack of Transportation (Medical): No  . Lack of Transportation (Non-Medical): No  Physical Activity: Insufficiently Active  . Days of Exercise per Week: 3 days  . Minutes of Exercise per Session: 30 min  Stress: No Stress Concern Present  . Feeling of Stress : Not at all  Social Connections: Moderately Isolated  . Frequency of Communication with Friends and Family: More than three times a week  . Frequency of Social Gatherings with Friends and Family: More than three times a week  . Attends Religious Services: More than 4 times per year  . Active Member of Clubs or Organizations: No  . Attends Banker Meetings: Never  . Marital Status: Widowed    ROS:   Please see the history of present illness.    All other systems are reviewed and are negative.    PHYSICAL EXAM:   VS:  BP 126/78 (BP Location: Left Arm, Patient Position: Sitting, Cuff Size: Normal)   Resp 18   Ht 5\' 2"  (1.575 m)   Wt 158 lb (71.7 kg)   SpO2 94%   BMI 28.90 kg/m      General: Alert, oriented x3, no distress, appears well.  Healthy left subclavian pacemaker site Head: no evidence of trauma, PERRL,  EOMI, no exophtalmos or lid lag, no myxedema, no xanthelasma; normal ears, nose and oropharynx Neck: normal jugular  venous pulsations and no hepatojugular reflux; brisk carotid pulses without delay and no carotid bruits Chest: clear to auscultation, no signs of consolidation by percussion or palpation, normal fremitus, symmetrical and full respiratory excursions Cardiovascular: normal position and quality of the apical impulse, regular rhythm, normal first and paradoxically second heart sounds, 2/6 systolic and 1/6 diastolic decrescendo murmurs in the left upper sternal border area, no rubs or gallops Abdomen: no tenderness or distention, no masses by palpation, no abnormal pulsatility or arterial bruits, normal bowel sounds, no hepatosplenomegaly Extremities: no clubbing, cyanosis or edema; 2+ radial, ulnar and brachial pulses bilaterally; 2+ right femoral, posterior tibial and dorsalis pedis pulses; 2+ left femoral, posterior tibial and dorsalis pedis pulses; no subclavian or femoral bruits Neurological: grossly nonfocal Psych: Normal mood and affect    Wt Readings from Last 3 Encounters:  05/26/21 158 lb (71.7 kg)  05/12/21 160 lb (72.6 kg)  03/07/21 161 lb (73 kg)    Studies/Labs Reviewed:   ECHO 10/24/2020: 1. Left ventricular ejection fraction, by estimation, is 45 to 50%. The  left ventricle has mildly decreased function. The left ventricle  demonstrates regional wall motion abnormalities with septal-lateral  dyssynchrony consistent with RV pacing. Left  ventricular diastolic parameters are consistent with Grade I diastolic  dysfunction (impaired relaxation).  2. Right ventricular systolic function is mildly reduced. The right  ventricular size is mildly enlarged. There is mildly elevated pulmonary  artery systolic pressure. The estimated right ventricular systolic  pressure is 37.1 mmHg.  3. Left atrial size was mildly dilated.  4. Right atrial size was mildly dilated.  5.  Tricuspid valve regurgitation is moderate.  6. The aortic valve is tricuspid. Aortic valve regurgitation is mild.  Mild aortic valve sclerosis is present, with no evidence of aortic valve  stenosis.  7. Thickened and doming pulmonary valve. There does not appear to be  significant stenosis. There is mild to moderate pulmonic insufficiency.  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  8. Aortic dilatation noted. There is mild dilatation of the ascending  aorta, measuring 40 mm.  9. The inferior vena cava is normal in size with greater than 50%  respiratory variability, suggesting right atrial pressure of 3 mmHg.  10. The mitral valve is normal in structure. Trivial mitral valve  regurgitation.   EKG:  EKG is ordered today shows AV sequential pacing with a positive R wave in leads V1-V2.  The QRS is quite broad at 194 ms, QTC 552 ms.  Lipid Panel     Component Value Date/Time   CHOL 128 05/26/2021 1011   TRIG 117 05/26/2021 1011   HDL 55 05/26/2021 1011   CHOLHDL 2.2 06/03/2020 0918   LDLCALC 52 05/26/2021 1011   BMET    Component Value Date/Time   NA 138 05/26/2021 1011   K 5.1 05/26/2021 1011   K 4.2 09/13/2013 1609   CL 98 05/26/2021 1011   CO2 22 05/26/2021 1011   GLUCOSE 112 (H) 05/26/2021 1011   GLUCOSE 132 (H) 03/08/2020 0813   BUN 24 05/26/2021 1011   CREATININE 1.28 (H) 05/26/2021 1011   CREATININE 1.03 06/25/2015 1012   CALCIUM 10.1 05/26/2021 1011   GFRNONAA 44 (L) 10/03/2020 1029   GFRAA 51 (L) 10/03/2020 1029   ASSESSMENT:    1. Chronic diastolic heart failure (HCC)   2. Essential hypertension   3. Paroxysmal atrial fibrillation (HCC)   4. CHB (complete heart block) (HCC)   5. Pacemaker   6. Long  term current use of anticoagulant therapy   7. Hypercholesterolemia   8. Pulmonic stenosis, congenital   9. History of repair of congenital atrial septal defect (ASD)   10. Aneurysm of left pulmonary artery (HCC)   11. Iron deficiency anemia due to  chronic blood loss     PLAN:  In order of problems listed above:  1. CHF, diastolic: NYHA functional class II.  Clinically euvolemic on the current diuretic dose. She has borderline reduced LVEF, likely related to RV apical pacing.  Reinforced the importance of sodium restricted diet and daily weight monitoring.   2. PAFlutter/ Afib: Very low burden of arrhythmia (0.1%), no longer episodes since November 2020.  CHADSVasc4 (age 18, gender, HF). 3. CHB: Pacemaker dependent.  She tolerates threshold testing poorly. 4. PPM: Normal device function.  Continue remote downloads every 3 months.  Office visit every 6 months 5. Anticoagulation: No bleeding complications.  The anticoagulant is dose adjusted for advanced age and body size, reduced GFR. 6. HLP: All parameters at target on current medications. 7. PS s/p surgical valvotomy: Mild to moderate regurgitation, no residual stenosis by echocardiogram.  No clinical signs of right heart failure today. 8. ASD s/p patch closure: Multiple echoes have shown no evidence of residual shunt 9. L PA aneurysm: 6.4 cm.  Plan conservative, non-surgical management.  Serial imaging for measurements due not appear to be indicated since there is no intention of performing surgery for this abnormality.. 10. Anemia: Has resolved.  Recent hemoglobin 12.2     Medication Adjustments/Labs and Tests Ordered: Current medicines are reviewed at length with the patient today.  Concerns regarding medicines are outlined above.  Medication changes, Labs and Tests ordered today are listed in the Patient Instructions below. Patient Instructions  Medication Instructions:  No changes *If you need a refill on your cardiac medications before your next appointment, please call your pharmacy*   Lab Work: Labs today  If you have labs (blood work) drawn today and your tests are completely normal, you will receive your results only by: [name] Kitchen. MyChart Message (if you have MyChart) OR . A  paper copy in the mail If you have any lab test that is abnormal or we need to change your treatment, we will call you to review the results.   Testing/Procedures: None ordered   Follow-Up: At [facility], you and your health needs are our priority.  As part of our continuing mission to provide you with exceptional heart care, we have created designated Provider Care Teams.  These Care Teams include your primary Cardiologist (physician) and Advanced Practice Providers (APPs -  Physician Assistants and Nurse Practitioners) who all work together to provide you with the care you need, when you need it.  We recommend signing up for the patient portal called "MyChart".  Sign up information is provided on this After Visit Summary.  MyChart is used to connect with patients for Virtual Visits (Telemedicine).  Patients are able to view lab/test results, encounter notes, upcoming appointments, etc.  Non-urgent messages can be sent to your provider as well.   To learn more about what you can do with MyChart, go to [email or web address].    Your next appointment:   6 month(s)  The format for your next appointment:   In Person  Provider:   Thurmon FairMihai Bernadean Saling, MD     Signed, Thurmon FairMihai Aspyn Warnke, MD  05/27/2021 5:19 PM    [facility] [address], [place], [place]  [id number] Phone: [phone];  Fax: [phone]

## 2021-05-27 LAB — CBC WITH DIFFERENTIAL
Basophils Absolute: 0 10*3/uL (ref 0.0–0.2)
Basos: 1 %
EOS (ABSOLUTE): 0.1 10*3/uL (ref 0.0–0.4)
Eos: 1 %
Hematocrit: 41.2 % (ref 34.0–46.6)
Hemoglobin: 12.9 g/dL (ref 11.1–15.9)
Immature Grans (Abs): 0 10*3/uL (ref 0.0–0.1)
Immature Granulocytes: 0 %
Lymphocytes Absolute: 1.5 10*3/uL (ref 0.7–3.1)
Lymphs: 17 %
MCH: 26.8 pg (ref 26.6–33.0)
MCHC: 31.3 g/dL — ABNORMAL LOW (ref 31.5–35.7)
MCV: 86 fL (ref 79–97)
Monocytes Absolute: 0.8 10*3/uL (ref 0.1–0.9)
Monocytes: 9 %
Neutrophils Absolute: 6.2 10*3/uL (ref 1.4–7.0)
Neutrophils: 72 %
RBC: 4.81 x10E6/uL (ref 3.77–5.28)
RDW: 14 % (ref 11.7–15.4)
WBC: 8.6 10*3/uL (ref 3.4–10.8)

## 2021-05-27 LAB — CMP14+EGFR
ALT: 12 IU/L (ref 0–32)
AST: 22 IU/L (ref 0–40)
Albumin/Globulin Ratio: 1.5 (ref 1.2–2.2)
Albumin: 4.2 g/dL (ref 3.6–4.6)
Alkaline Phosphatase: 109 IU/L (ref 44–121)
BUN/Creatinine Ratio: 19 (ref 12–28)
BUN: 24 mg/dL (ref 8–27)
Bilirubin Total: 0.9 mg/dL (ref 0.0–1.2)
CO2: 22 mmol/L (ref 20–29)
Calcium: 10.1 mg/dL (ref 8.7–10.3)
Chloride: 98 mmol/L (ref 96–106)
Creatinine, Ser: 1.28 mg/dL — ABNORMAL HIGH (ref 0.57–1.00)
Globulin, Total: 2.8 g/dL (ref 1.5–4.5)
Glucose: 112 mg/dL — ABNORMAL HIGH (ref 65–99)
Potassium: 5.1 mmol/L (ref 3.5–5.2)
Sodium: 138 mmol/L (ref 134–144)
Total Protein: 7 g/dL (ref 6.0–8.5)
eGFR: 41 mL/min/{1.73_m2} — ABNORMAL LOW (ref >59)

## 2021-05-27 LAB — MICROALBUMIN / CREATININE URINE RATIO
Creatinine, Urine: 125.5 mg/dL
Microalb/Creat Ratio: 40 mg/g creat — ABNORMAL HIGH (ref 0–29)
Microalbumin, Urine: 49.8 ug/mL

## 2021-05-27 LAB — LIPID PANEL WITH LDL/HDL RATIO
Cholesterol, Total: 128 mg/dL (ref 100–199)
HDL: 55 mg/dL (ref >39)
LDL Chol Calc (NIH): 52 mg/dL (ref 0–99)
LDL/HDL Ratio: 0.9 ratio (ref 0.0–3.2)
Triglycerides: 117 mg/dL (ref 0–149)
VLDL Cholesterol Cal: 21 mg/dL (ref 5–40)

## 2021-05-27 LAB — HEMOGLOBIN A1C
Est. average glucose Bld gHb Est-mCnc: 131 mg/dL
Hgb A1c MFr Bld: 6.2 % — ABNORMAL HIGH (ref 4.8–5.6)

## 2021-05-28 ENCOUNTER — Telehealth: Payer: Self-pay | Admitting: Cardiovascular Disease

## 2021-05-28 NOTE — Telephone Encounter (Signed)
PT WAS SUPPOSED TO DO A HOME PACER CHECK BUT STATES THAT IT ISNT WORKING, PLEASE ADVISE

## 2021-05-28 NOTE — Telephone Encounter (Signed)
I spoke with the patient daughter and gave her the number to [facility] tech support. She agreed to call them tonight.

## 2021-05-29 ENCOUNTER — Ambulatory Visit (INDEPENDENT_AMBULATORY_CARE_PROVIDER_SITE_OTHER): Payer: Medicare Other

## 2021-05-29 DIAGNOSIS — I442 Atrioventricular block, complete: Secondary | ICD-10-CM

## 2021-05-30 ENCOUNTER — Other Ambulatory Visit: Payer: Self-pay

## 2021-05-30 ENCOUNTER — Encounter (HOSPITAL_BASED_OUTPATIENT_CLINIC_OR_DEPARTMENT_OTHER): Payer: Medicare Other | Attending: Internal Medicine | Admitting: Internal Medicine

## 2021-05-30 DIAGNOSIS — M79606 Pain in leg, unspecified: Secondary | ICD-10-CM | POA: Diagnosis not present

## 2021-05-30 DIAGNOSIS — Z950 Presence of cardiac pacemaker: Secondary | ICD-10-CM | POA: Diagnosis not present

## 2021-05-30 DIAGNOSIS — I509 Heart failure, unspecified: Secondary | ICD-10-CM | POA: Insufficient documentation

## 2021-05-30 DIAGNOSIS — L97319 Non-pressure chronic ulcer of right ankle with unspecified severity: Secondary | ICD-10-CM | POA: Diagnosis present

## 2021-05-30 DIAGNOSIS — Z7901 Long term (current) use of anticoagulants: Secondary | ICD-10-CM | POA: Diagnosis not present

## 2021-05-30 DIAGNOSIS — I110 Hypertensive heart disease with heart failure: Secondary | ICD-10-CM | POA: Insufficient documentation

## 2021-05-30 DIAGNOSIS — I480 Paroxysmal atrial fibrillation: Secondary | ICD-10-CM | POA: Diagnosis not present

## 2021-06-02 LAB — CUP PACEART REMOTE DEVICE CHECK
Battery Impedance: 911 Ohm
Battery Remaining Longevity: 51 mo
Battery Voltage: 2.76 V
Brady Statistic AP VP Percent: 96 %
Brady Statistic AP VS Percent: 1 %
Brady Statistic AS VP Percent: 3 %
Brady Statistic AS VS Percent: 0 %
Date Time Interrogation Session: 20220611085832
Implantable Lead Implant Date: 20070920
Implantable Lead Implant Date: 20070920
Implantable Lead Location: 753859
Implantable Lead Location: 753860
Implantable Lead Model: 4592
Implantable Lead Model: 5092
Implantable Pulse Generator Implant Date: 20160707
Lead Channel Impedance Value: 503 Ohm
Lead Channel Impedance Value: 739 Ohm
Lead Channel Pacing Threshold Amplitude: 0.625 V
Lead Channel Pacing Threshold Amplitude: 1.5 V
Lead Channel Pacing Threshold Pulse Width: 0.4 ms
Lead Channel Pacing Threshold Pulse Width: 0.4 ms
Lead Channel Setting Pacing Amplitude: 2 V
Lead Channel Setting Pacing Amplitude: 3 V
Lead Channel Setting Pacing Pulse Width: 0.4 ms
Lead Channel Setting Sensing Sensitivity: 5.6 mV

## 2021-06-04 NOTE — Progress Notes (Signed)
OMYA, WINFIELD ([id number]) Visit Report for 05/30/2021 Abuse/Suicide Risk Screen Details Patient Name: Date of Service: [place], [name] 05/30/2021 9:00 A M Medical Record Number: [id number] Patient Account Number: [id number] Date of Birth/Sex: Treating RN: 02-Feb-1933 (86 y.o. Female) Fonnie Mu Primary Care Camey Edell: Lennette Bihari [facility] Other Clinician: Referring Adira Limburg: Treating Zuleika Gallus/Extender: Neita Garnet [facility] Weeks in Treatment: 0 Abuse/Suicide Risk Screen Items Answer ABUSE RISK SCREEN: Has anyone close to you tried to hurt or harm you recentlyo No Do you feel uncomfortable with anyone in your familyo No Has anyone forced you do things that you didnt want to doo No Electronic Signature(s) Signed: 06/04/2021 6:22:58 PM By: Fonnie Mu RN Entered By: Fonnie Mu on 05/30/2021 09:14:00 -------------------------------------------------------------------------------- Activities of Daily Living Details Patient Name: Date of Service: [place], [name] 05/30/2021 9:00 A M Medical Record Number: [id number] Patient Account Number: [id number] Date of Birth/Sex: Treating RN: 02-Feb-1933 (87 y.o. Female) Fonnie Mu Primary Care Makayle Krahn: Lennette Bihari [facility] Other Clinician: Referring Cherokee Clowers: Treating Decklyn Hornik/Extender: Neita Garnet [facility] Weeks in Treatment: 0 Activities of Daily Living Items Answer Activities of Daily Living (Please select one for each item) Drive Automobile Not Able T Medications ake Need Assistance Use T elephone Need Assistance Care for Appearance Need Assistance Use T oilet Completely Able Bath / Shower Need Assistance Dress Self Need Assistance Feed Self Completely Able Walk Need Assistance Get In / Out Bed Completely Able Housework Need Assistance Prepare Meals Need Assistance Handle Money Need Assistance Shop for Self Need Assistance Electronic Signature(s) Signed: 06/04/2021 6:22:58 PM By: Fonnie Mu  RN Entered By: Fonnie Mu on 05/30/2021 09:14:24 -------------------------------------------------------------------------------- Education Screening Details Patient Name: Date of Service: [name] 05/30/2021 9:00 A M Medical Record Number: [id number] Patient Account Number: [id number] Date of Birth/Sex: Treating RN: 1933-09-19 (87 y.o. Female) Fonnie Mu Primary Care Mavery Milling: Lennette Bihari [facility] Other Clinician: Referring Roshini Fulwider: Treating Izaiyah Kleinman/Extender: Neita Garnet [facility] Weeks in Treatment: 0 Primary Learner Assessed: Patient Learning Preferences/Education Level/Primary Language Learning Preference: Explanation, Demonstration, Communication Board, Printed Material Highest Education Level: High School Preferred Language: English Cognitive Barrier Language Barrier: No Translator Needed: No Memory Deficit: No Emotional Barrier: No Cultural/Religious Beliefs Affecting Medical Care: No Physical Barrier Impaired Vision: Yes Impaired Hearing: No Decreased Hand dexterity: No Knowledge/Comprehension Knowledge Level: High Comprehension Level: High Ability to understand written instructions: High Ability to understand verbal instructions: High Motivation Anxiety Level: Calm Cooperation: Cooperative Education Importance: Denies Need Interest in Health Problems: Asks Questions Perception: Coherent Willingness to Engage in Self-Management High Activities: Readiness to Engage in Self-Management High Activities: Electronic Signature(s) Signed: 06/04/2021 6:22:58 PM By: Fonnie Mu RN Entered By: Fonnie Mu on 05/30/2021 09:14:57 -------------------------------------------------------------------------------- Fall Risk Assessment Details Patient Name: Date of Service: [name], [name] 05/30/2021 9:00 A M Medical Record Number: [id number] Patient Account Number: [id number] Date of Birth/Sex: Treating RN: Nov 21, 1933 (87 y.o. Female)  Fonnie Mu Primary Care Avelina Mcclurkin: Lennette Bihari [facility] Other Clinician: Referring Georgeann Brinkman: Treating Ilia Dimaano/Extender: Neita Garnet [facility] Weeks in Treatment: 0 Fall Risk Assessment Items Have you had 2 or more falls in the last 12 monthso 0 No Have you had any fall that resulted in injury in the last 12 monthso 0 No FALLS RISK SCREEN History of falling - immediate or within 3 months 25 Yes Secondary diagnosis (Do you have 2 or more medical diagnoseso) 0 No Ambulatory aid None/bed rest/wheelchair/nurse 0 No Crutches/cane/walker 0 No Furniture  0 No Intravenous therapy Access/Saline/Heparin Lock 0 No Gait/Transferring Normal/ bed rest/ wheelchair 0 No Weak (short steps with or without shuffle, stooped but able to lift head while walking, may seek 0 No support from furniture) Impaired (short steps with shuffle, may have difficulty arising from chair, head down, impaired 0 No balance) Mental Status Oriented to own ability 0 No Electronic Signature(s) Signed: 06/04/2021 6:22:58 PM By: Fonnie Mu RN Entered By: Fonnie Mu on 05/30/2021 09:15:07 -------------------------------------------------------------------------------- Foot Assessment Details Patient Name: Date of Service: [name] 05/30/2021 9:00 A M Medical Record Number: [id number] Patient Account Number: [id number] Date of Birth/Sex: Treating RN: [birth year] (87 y.o. Female) Fonnie Mu Primary Care Maizie Garno: Lennette Bihari [facility] Other Clinician: Referring Kollin Udell: Treating Carrye Goller/Extender: Neita Garnet [facility] Weeks in Treatment: 0 Foot Assessment Items Site Locations + = Sensation present, - = Sensation absent, C = Callus, U = Ulcer R = Redness, W = Warmth, M = Maceration, PU = Pre-ulcerative lesion F = Fissure, S = Swelling, D = Dryness Assessment Right: Left: Other Deformity: No No Prior Foot Ulcer: No No Prior Amputation: No No Charcot Joint: No No Ambulatory  Status: Ambulatory With Help Assistance Device: Wheelchair Gait: Steady Electronic Signature(s) Signed: 06/04/2021 6:22:58 PM By: Fonnie Mu RN Entered By: Fonnie Mu on 05/30/2021 09:26:02 -------------------------------------------------------------------------------- Nutrition Risk Screening Details Patient Name: Date of Service: [name] 05/30/2021 9:00 A M Medical Record Number: [id number] Patient Account Number: [id number] Date of Birth/Sex: Treating RN: [birth year] (87 y.o. Female) Fonnie Mu Primary Care Sieara Bremer: Lennette Bihari [facility] Other Clinician: Referring Marthe Dant: Treating Brennley Curtice/Extender: Neita Garnet [facility] Weeks in Treatment: 0 Height (in): Weight (lbs): Body Mass Index (BMI): Nutrition Risk Screening Items Score Screening NUTRITION RISK SCREEN: I have an illness or condition that made me change the kind and/or amount of food I eat 0 No I eat fewer than two meals per day 0 No I eat few fruits and vegetables, or milk products 0 No I have three or more drinks of beer, liquor or wine almost every day 0 No I have tooth or mouth problems that make it hard for me to eat 0 No I don't always have enough money to buy the food I need 0 No I eat alone most of the time 0 No I take three or more different prescribed or over-the-counter drugs a day 0 No Without wanting to, I have lost or gained 10 pounds in the last six months 0 No I am not always physically able to shop, cook and/or feed myself 0 No Nutrition Protocols Good Risk Protocol 0 No interventions needed Moderate Risk Protocol High Risk Proctocol Risk Level: Good Risk Score: 0 Electronic Signature(s) Signed: 06/04/2021 6:22:58 PM By: Fonnie Mu RN Entered By: Fonnie Mu on 05/30/2021 09:15:12

## 2021-06-04 NOTE — Progress Notes (Signed)
[name] Baird, [name] Baird ([id number]) Visit Report for 05/30/2021 Chief Complaint Document Details Patient Name: Date of Service: [place], [name] 05/30/2021 9:00 A M Medical Record Number: [id number] Patient Account Number: [id number] Date of Birth/Sex: Treating RN: 03/04/1933 (87 y.o. Female) [name] Baird Primary Care Provider: Lennette Bihari [facility] Other Clinician: Referring Provider: Treating Provider/Extender: Neita Garnet [facility] Weeks in Treatment: 0 Information Obtained from: Patient Chief Complaint Right ankle wound Electronic Signature(s) Signed: 05/30/2021 12:27:58 PM By: Geralyn Corwin DO Entered By: Geralyn Corwin on 05/30/2021 12:12:17 -------------------------------------------------------------------------------- Debridement Details Patient Name: Date of Service: [name] Baird, [name] 05/30/2021 9:00 A M Medical Record Number: [id number] Patient Account Number: [id number] Date of Birth/Sex: Treating RN: 10/27/1933 (87 y.o. Female) [name] Baird Primary Care Provider: Lennette Bihari [facility] Other Clinician: Referring Provider: Treating Provider/Extender: Neita Garnet [facility] Weeks in Treatment: 0 Debridement Performed for Assessment: Wound #1 Right,Lateral Malleolus Performed By: Physician Geralyn Corwin, DO Debridement Type: Debridement Level of Consciousness (Pre-procedure): Awake and Alert Pre-procedure Verification/Time Out Yes - 10:05 Taken: Start Time: 10:06 Pain Control: Other : Benzocaine T Area Debrided (L x W): otal 0.2 (cm) x 0.3 (cm) = 0.06 (cm) Tissue and other material debrided: Non-Viable, Skin: Dermis Level: Skin/Dermis Debridement Description: Selective/Open Wound Instrument: Other : Gauze Bleeding: None End Time: 10:09 Response to Treatment: Procedure was tolerated well Level of Consciousness (Post- Awake and Alert procedure): Post Debridement Measurements of Total Wound Length: (cm) 0.2 Stage: Unstageable/Unclassified Width: (cm)  0.3 Depth: (cm) 0.2 Volume: (cm) 0.009 Character of Wound/Ulcer Post Debridement: Stable Post Procedure Diagnosis Same as Pre-procedure Electronic Signature(s) Signed: 05/30/2021 12:27:58 PM By: Geralyn Corwin DO Signed: 05/30/2021 5:10:19 PM By: [name] Baird Entered By: [name] Baird on 05/30/2021 10:10:43 -------------------------------------------------------------------------------- HPI Details Patient Name: Date of Service: [name] Baird, [name] 05/30/2021 9:00 A M Medical Record Number: [id number] Patient Account Number: [id number] Date of Birth/Sex: Treating RN: [birth year] (87 y.o. Female) [name] Baird Primary Care Provider: Lennette Bihari [facility] Other Clinician: Referring Provider: Treating Provider/Extender: Neita Garnet [facility] Weeks in Treatment: 0 History of Present Illness HPI Description: Admission 6/10 [name] is an 87 year old female with a past medical history of paroxysmal A. fib on anticoagulation, congenital heart disease (atrial septal defect status post patch closure), pulmonic stenosis (status post valvotomy 1985), left pulmonary artery aneurysm, complete heart block status post dual-chamber permanent pacemaker implantation that presents to the clinic for a right lateral malleolus wound that developed 3 months ago. She is not quite sure how it started but thinks she may have hit it against her walker. She has been placing Neosporin on this wound daily. She reports pain to the area worse at night and often finds herself laying her leg over the side of the bed. She has been given doxycycline for this issue by her podiatrist. She currently denies signs of infection. She reports trying to offload the area the best she can daily. Electronic Signature(s) Signed: 05/30/2021 12:27:58 PM By: Geralyn Corwin DO Entered By: Geralyn Corwin on 05/30/2021 12:20:12 -------------------------------------------------------------------------------- Physical Exam  Details Patient Name: Date of Service: [name] Baird, [name] 05/30/2021 9:00 A M Medical Record Number: [id number] Patient Account Number: [id number] Date of Birth/Sex: Treating RN: Sep 19, 1933 (87 y.o. Female) [name] Baird Primary Care Provider: Lennette B[facility] Other Clinician: Referring Provider: Treating Provider/Extender: Neita Garnet [facility] Weeks in Treatment: 0 Constitutional respirations regular, non-labored and within target range for patient.[name] Kitchen Psychiatric pleasant and cooperative. Notes Right  ankle: Lateral malleolus with a small open wound that has dried nonviable tissue overlying it. Extremely sensitive to the touch. No increased warmth or erythema or drainage noted. No signs of infection. Decreased pedal pulses Electronic Signature(s) Signed: 05/30/2021 12:27:58 PM By: Geralyn Corwin DO Entered By: Geralyn Corwin on 05/30/2021 12:22:12 -------------------------------------------------------------------------------- Physician Orders Details Patient Name: Date of Service: [name] Baird, [name] 05/30/2021 9:00 A M Medical Record Number: [id number] Patient Account Number: [id number] Date of Birth/Sex: Treating RN: 1933/02/28 (87 y.o. 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 - with Dr. Mikey Bussing Return A Other: - [employer] for Supplies Bathing/ Shower/ Hygiene May shower and wash wound with soap and water. Edema Control - Lymphedema / SCD / Other Elevate legs to the level of the heart or above for 30 minutes  daily and/or when sitting, a frequency of: Avoid standing for long periods of time. Additional Orders / Instructions Follow Nutritious Diet Wound Treatment Wound #1 - Malleolus Wound Laterality: Right, Lateral Cleanser: Soap and Water 1 x Per Day/30 Days Discharge Instructions: May shower and wash wound with dial antibacterial soap and water prior to dressing change. Cleanser: Wound Cleanser 1 x Per Day/30 Days Discharge Instructions: Cleanse the wound with wound cleanser prior to applying a clean dressing using gauze sponges, not tissue or cotton balls. Peri-Wound Care: Skin Prep 1 x Per Day/30 Days Discharge Instructions: Use skin prep as directed Prim Dressing: Promogran Prisma Matrix, 4.34 (sq in) (silver collagen) 1 x Per Day/30 Days ary Discharge Instructions: Moisten collagen with saline or hydrogel Secondary Dressing: Woven Gauze Sponges 2x2 in 1 x Per Day/30 Days Discharge Instructions: Apply over primary dressing as directed. Secondary Dressing: Zetuvit Plus Silicone Border Dressing 4x4 (in/in) 1 x Per Day/30 Days Discharge Instructions: Apply silicone border over primary dressing as directed. Consults Vascular Consult - VVS: Consult for non-healing right ankle wound. ABI/TBI requested as well. Electronic Signature(s) Signed: 05/30/2021 12:27:58 PM By: Geralyn Corwin DO Entered By: Geralyn Corwin on 05/30/2021 12:24:33 -------------------------------------------------------------------------------- Problem List Details Patient Name: Date of Service: [name] Baird, [name] 05/30/2021 9:00 A M Medical Record Number: [id number] Patient Account Number: [id number] Date of Birth/Sex: Treating RN: 03-20-1933 (87 y.o. 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 05/30/2021 9:00 A M Medical Record Number: [id number] Patient Account Number: [id number] Date of Birth/Sex: Treating RN: Apr 16, 1933 (87 y.o. Female) [name] Baird Primary Care Provider: Lennette Bihari [facility] Other Clinician: Referring Provider: Treating Provider/Extender: Neita Garnet [facility] Weeks in Treatment: 0 Subjective  Chief Complaint Information obtained from Patient Right ankle wound History of Present Illness (HPI) Admission 6/10 Ms. Ebony HailMona Dusek is an 87 year old female with a past medical history of paroxysmal A. fib on anticoagulation, congenital heart disease (atrial septal defect status post patch closure), pulmonic stenosis (status post valvotomy 1985), left pulmonary artery aneurysm, complete heart block status post dual-chamber permanent pacemaker implantation that presents to the clinic for a right lateral malleolus wound that developed 3 months ago. She is not quite sure how it started but thinks she may have hit it against her walker. She has been placing Neosporin on this wound daily. She reports pain to the area worse at night and often finds herself laying her leg over the side of the bed. She has been given doxycycline for this issue by her podiatrist. She currently denies signs of infection. She reports trying to offload the area the best she  can daily. Patient History Information obtained from Patient. Allergies Sulfa (Sulfonamide Antibiotics), hydrocodone Family History Unknown History. Social History Never smoker, Marital Status - Married, Alcohol Use - Never, Drug Use - No History, Caffeine Use - Rarely. Medical History Eyes Patient has history of Glaucoma Denies history of Cataracts, Optic Neuritis Ear/Nose/Mouth/Throat Denies history of Chronic sinus problems/congestion, Middle ear problems Hematologic/Lymphatic Denies history of Anemia, Hemophilia, Human Immunodeficiency Virus, Lymphedema, Sickle Cell Disease Respiratory Denies history of Aspiration, Asthma, Chronic Obstructive Pulmonary Disease (COPD), Pneumothorax, Sleep Apnea, Tuberculosis Cardiovascular Patient has history of Arrhythmia - atrial flutter, Congestive Heart Failure, Hypertension Denies history of Angina, Coronary Artery Disease, Deep Vein Thrombosis, Hypotension, Myocardial Infarction, Peripheral Arterial Disease, Peripheral Venous Disease, Phlebitis, Vasculitis Gastrointestinal Denies history of Cirrhosis , Colitis, Crohnoos, Hepatitis A, Hepatitis B, Hepatitis C Endocrine Denies history of Type I Diabetes, Type II Diabetes Genitourinary Denies history of End Stage Renal Disease Immunological Denies history of Lupus Erythematosus, Raynaudoos, Scleroderma Integumentary (Skin) Denies history of History of Burn Musculoskeletal Denies history of Gout, Rheumatoid Arthritis, Osteoarthritis, Osteomyelitis Neurologic Denies history of Dementia, Neuropathy, Quadriplegia, Paraplegia, Seizure Disorder Oncologic Denies history of Received Chemotherapy, Received Radiation Psychiatric Denies history of Anorexia/bulimia, Confinement Anxiety Medical A Surgical History Notes nd Cardiovascular complete heart block s/p medtronic pacemaker status post patch cllosure of ASD Psychiatric anxiety and depression Review of Systems (ROS) Constitutional  Symptoms (General Health) Denies complaints or symptoms of Fatigue, Fever, Chills, Marked Weight Change. Eyes Denies complaints or symptoms of Dry Eyes, Vision Changes, Glasses / Contacts. Ear/Nose/Mouth/Throat Denies complaints or symptoms of Chronic sinus problems or rhinitis. Respiratory Denies complaints or symptoms of Chronic or frequent coughs, Shortness of Breath. Cardiovascular Denies complaints or symptoms of Chest pain. Gastrointestinal Denies complaints or symptoms of Frequent diarrhea, Nausea, Vomiting. Endocrine Denies complaints or symptoms of Heat/cold intolerance. Genitourinary Denies complaints or symptoms of Frequent urination. Integumentary (Skin) Complains or has symptoms of Wounds. Musculoskeletal Denies complaints or symptoms of Muscle Pain, Muscle Weakness. Neurologic Denies complaints or symptoms of Numbness/parasthesias. Psychiatric Denies complaints or symptoms of Claustrophobia, Suicidal. Objective Constitutional respirations regular, non-labored and within target range for patient.[name] Kitchen. Psychiatric pleasant and cooperative. General Notes: Right ankle: Lateral malleolus with a small open wound that has dried nonviable tissue overlying it. Extremely sensitive to the touch. No increased warmth or erythema or drainage noted. No signs of infection. Decreased pedal pulses Integumentary (Hair, Skin) Wound #1 status is Open. Original cause of wound was Trauma. The date acquired was: 01/21/2021. The wound is located on the Right,Lateral Malleolus. The wound measures 0.2cm length x 0.3cm width x 0.2cm depth;  0.047cm^2 area and 0.009cm^3 volume. There is no tunneling or undermining noted. There is a medium amount of serosanguineous drainage noted. The wound margin is distinct with the outline attached to the wound base. There is no granulation within the wound bed. There is a large (67-100%) amount of necrotic tissue within the wound bed including Eschar and Adherent  Slough. 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 Per chart review patient has had this issue for 6 months. She has been following podiatry for this issue. We try to obtain ABIs but patient could not tolerate in the office. I am concerned based on her history that she has resting leg pain with nonhealing wound. At this time I would like to go ahead and refer to vein and vascular for further assessment. I recommended using collagen to this area daily and keep it protected. We also talked discussed aggressive offloading to this area. The surrounding skin appears well intact and no signs of infection. She can follow-up in 2 weeks Procedures Wound #1 Pre-procedure diagnosis of Wound #1 is a Pressure Ulcer located on the Right,Lateral Malleolus . There was a Selective/Open Wound Skin/Dermis Debridement with a total area of 0.06 sq cm performed by Geralyn Corwin, DO. With the following instrument(s): Gauze to remove Non-Viable tissue/material. Material removed includes Skin: Dermis after achieving pain control using Other (Benzocaine). No specimens were taken. A time out was conducted at 10:05, prior to the start of the procedure. There was no bleeding. The procedure was tolerated well. Post Debridement Measurements: 0.2cm length x 0.3cm width x 0.2cm depth; 0.009cm^3 volume. Post debridement Stage noted as Unstageable/Unclassified. Character of Wound/Ulcer Post Debridement is stable. Post procedure Diagnosis Wound #1: Same as Pre-Procedure Plan Follow-up Appointments: Return Appointment in 2 weeks. - with Dr. Mikey Bussing Other: - [employer] for Supplies Bathing/ Shower/ Hygiene: May shower and wash wound with soap and water. Edema  Control - Lymphedema / SCD / Other: Elevate legs to the level of the heart or above for 30 minutes daily and/or when sitting, a frequency of: Avoid standing for long periods of time. Additional Orders / Instructions: Follow Nutritious Diet Consults ordered were: Vascular Consult - VVS: Consult for non-healing right ankle wound. ABI/TBI requested as well. WOUND #1: - Malleolus Wound Laterality: Right, Lateral Cleanser: Soap and Water 1 x Per Day/30 Days Discharge Instructions: May shower and wash wound with dial antibacterial soap and water prior to dressing change. Cleanser: Wound Cleanser 1 x Per Day/30 Days Discharge Instructions: Cleanse the wound with wound cleanser prior to applying a clean dressing using gauze sponges, not tissue or cotton balls. Peri-Wound Care: Skin Prep 1 x Per Day/30 Days Discharge Instructions: Use skin prep as directed Prim Dressing: Promogran Prisma Matrix, 4.34 (sq in) (silver collagen) 1 x Per Day/30 Days ary Discharge Instructions: Moisten collagen with saline or hydrogel Secondary Dressing: Woven Gauze Sponges 2x2 in 1 x Per Day/30 Days Discharge Instructions: Apply over primary dressing as directed. Secondary Dressing: Zetuvit Plus Silicone Border Dressing 4x4 (in/in) 1 x Per Day/30 Days Discharge Instructions: Apply silicone border over primary dressing as directed. 1. Collagen 2. Vein and vascular referral 3. Follow-up in 2 weeks Electronic Signature(s) Signed: 05/30/2021 12:27:58 PM By: Geralyn Corwin DO Entered By: Geralyn Corwin on 05/30/2021 12:26:45 -------------------------------------------------------------------------------- HxROS Details Patient Name: Date of Service:  [place], [name] 05/30/2021 9:00 A M Medical Record Number: [id number] Patient Account Number: [id number] Date of Birth/Sex: Treating RN: 06/05/1933 (87 y.o. Female) Fonnie Mu Primary Care Provider: Lennette Bihari [facility] Other Clinician: Referring Provider: Treating  Provider/Extender: Neita Garnet [facility] Weeks in Treatment: 0 Information Obtained From Patient Constitutional Symptoms (General Health) Complaints and Symptoms: Negative for: Fatigue; Fever; Chills; Marked Weight Change Eyes Complaints and Symptoms: Negative for: Dry Eyes; Vision Changes; Glasses / Contacts Medical History: Positive for: Glaucoma Negative for: Cataracts; Optic Neuritis Ear/Nose/Mouth/Throat Complaints and Symptoms: Negative for: Chronic sinus problems or rhinitis Medical History: Negative for: Chronic sinus problems/congestion; Middle ear problems Respiratory Complaints and Symptoms: Negative for: Chronic or frequent coughs; Shortness of Breath Medical History: Negative for: Aspiration; Asthma; Chronic Obstructive Pulmonary Disease (COPD); Pneumothorax; Sleep Apnea; Tuberculosis Cardiovascular Complaints and Symptoms: Negative for: Chest pain Medical History: Positive for: Arrhythmia - atrial flutter; Congestive Heart Failure; Hypertension Negative for: Angina; Coronary Artery Disease; Deep Vein Thrombosis; Hypotension; Myocardial Infarction; Peripheral Arterial Disease; Peripheral Venous Disease; Phlebitis; Vasculitis Past Medical History Notes: complete heart block s/p medtronic pacemaker status post patch cllosure of ASD Gastrointestinal Complaints and Symptoms: Negative for: Frequent diarrhea; Nausea; Vomiting Medical History: Negative for: Cirrhosis ; Colitis; Crohns; Hepatitis A; Hepatitis B; Hepatitis C Endocrine Complaints and Symptoms: Negative for: Heat/cold intolerance Medical History: Negative for: Type I Diabetes; Type II Diabetes Genitourinary Complaints and Symptoms: Negative for: Frequent urination Medical History: Negative for: End Stage Renal Disease Integumentary (Skin) Complaints and Symptoms: Positive for: Wounds Medical History: Negative for: History of Burn Musculoskeletal Complaints and Symptoms: Negative  for: Muscle Pain; Muscle Weakness Medical History: Negative for: Gout; Rheumatoid Arthritis; Osteoarthritis; Osteomyelitis Neurologic Complaints and Symptoms: Negative for: Numbness/parasthesias Medical History: Negative for: Dementia; Neuropathy; Quadriplegia; Paraplegia; Seizure Disorder Psychiatric Complaints and Symptoms: Negative for: Claustrophobia; Suicidal Medical History: Negative for: Anorexia/bulimia; Confinement Anxiety Past Medical History Notes: anxiety and depression Hematologic/Lymphatic Medical History: Negative for: Anemia; Hemophilia; Human Immunodeficiency Virus; Lymphedema; Sickle Cell Disease Immunological Medical History: Negative for: Lupus Erythematosus; Raynauds; Scleroderma Oncologic Medical History: Negative for: Received Chemotherapy; Received Radiation HBO Extended History Items Eyes: Glaucoma Immunizations Pneumococcal Vaccine: Received Pneumococcal Vaccination: Yes Implantable Devices Yes Family and Social History Unknown History: Yes; Never smoker; Marital Status - Married; Alcohol Use: Never; Drug Use: No History; Caffeine Use: Rarely; Financial Concerns: No; Food, Clothing or Shelter Needs: No; Support System Lacking: No; Transportation Concerns: No Electronic Signature(s) Signed: 05/30/2021 12:27:58 PM By: Geralyn Corwin DO Signed: 06/04/2021 6:22:58 PM By: Fonnie Mu RN Entered By: Fonnie Mu on 05/30/2021 09:13:47 -------------------------------------------------------------------------------- SuperBill Details Patient Name: Date of Service: [place] NA S. 05/30/2021 Medical Record Number: [id number] Patient Account Number: [id number] Date of Birth/Sex: Treating RN: 10/19/1933 (87 y.o. 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 I48.0 Paroxysmal atrial fibrillation Z79.01 Long term (current) use of anticoagulants Z95.0 Presence of cardiac pacemaker M79.606 Pain in leg, unspecified Facility Procedures CPT4 Code: [id number] Description: 99213 - WOUND CARE VISIT-LEV 3 EST PT Modifier: 25 Quantity: 1 CPT4 Code: [id number] Description: 97597 - DEBRIDE WOUND 1ST 20 SQ CM OR < ICD-10 Diagnosis Description 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 Modifier: Quantity: 1 Physician Procedures : CPT4 Code Description Modifier [id number] WC PHYS LEVEL 3 NEW PT ICD-10 Diagnosis Description  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

## 2021-06-04 NOTE — Progress Notes (Signed)
[name] Baird, [name] Baird ([id number]) Visit Report for 05/30/2021 Allergy List Details Patient Name: Date of Service: [name], [name] 05/30/2021 9:00 A Baird Medical Record Number: [id number] Patient Account Number: [id number] Date of Birth/Sex: Treating RN: 24-Mar-1933 (87 y.o. Female) Fonnie Mu Primary Care Avigdor Dollar: Lennette Bihari [facility] Other Clinician: Referring Sangita Zani: Treating Tylek Boney/Extender: Lynnae January, JO SEPH Weeks in Treatment: 0 Allergies Active Allergies Sulfa (Sulfonamide Antibiotics) hydrocodone Allergy Notes Electronic Signature(s) Signed: 06/04/2021 6:22:58 PM By: Fonnie Mu RN Entered By: Fonnie Mu on 05/30/2021 09:09:49 -------------------------------------------------------------------------------- Arrival Information Details Patient Name: Date of Service: [name] Baird, [name] NA S. 05/30/2021 9:00 A Baird Medical Record Number: [id number] Patient Account Number: [id number] Date of Birth/Sex: Treating RN: Jul 04, 1933 (87 y.o. Female) Fonnie Mu Primary Care Shailene Demonbreun: Lennette Bihari [facility] Other Clinician: Referring Aryelle Figg: Treating Shiesha Jahn/Extender: Neita Garnet SEPH Weeks in Treatment: 0 Visit Information Patient Arrived: Wheel Chair Arrival Time: 09:08 Accompanied By: daughter Transfer Assistance: None Patient Identification Verified: Yes Secondary Verification Process Completed: Yes Patient Requires Transmission-Based Precautions: No Patient Has Alerts: No Electronic Signature(s) Signed: 06/04/2021 6:22:58 PM By: Fonnie Mu RN Entered By: Fonnie Mu on 05/30/2021 09:08:13 -------------------------------------------------------------------------------- Clinic Level of Care Assessment Details Patient Name: Date of Service: [place], [name] Baird Medical Record Number: [id number] Patient Account Number: [id number] Date of Birth/Sex: Treating RN: 18-Jan-1933 (87 y.o. Female) [name] Primary Care  Kenzel Ruesch: Lennette Bihari [facility] Other Clinician: Referring Emmery Seiler: Treating Alyas Creary/Extender: Neita Garnet SEPH Weeks in Treatment: 0 Clinic Level of Care Assessment Items TOOL 1 Quantity Score X- 1 0 Use when EandM and Procedure is performed on INITIAL visit ASSESSMENTS - Nursing Assessment / Reassessment X- 1 20 General Physical Exam (combine w/ comprehensive assessment (listed just below) when performed on new pt. evals) X- 1 25 Comprehensive Assessment (HX, ROS, Risk Assessments, Wounds Hx, etc.) ASSESSMENTS - Wound and Skin Assessment / Reassessment []  - 0 Dermatologic / Skin Assessment (not related to wound area) ASSESSMENTS - Ostomy and/or Continence Assessment and Care []  - 0 Incontinence Assessment and Management []  - 0 Ostomy Care Assessment and Management (repouching, etc.) PROCESS - Coordination of Care []  - 0 Simple Patient / Family Education for ongoing care X- 1 20 Complex (extensive) Patient / Family Education for ongoing care X- 1 10 Staff obtains , Records, T Results / Process Orders est X- 1 10 Staff telephones HHA, Nursing Homes / Clarify orders / etc []  - 0 Routine Transfer to another Facility (non-emergent condition) []  - 0 Routine Hospital Admission (non-emergent condition) []  - 0 New Admissions / / Ordering NPWT Apligraf, etc. , []  - 0 Emergency Hospital Admission (emergent condition) PROCESS - Special Needs []  - 0 Pediatric / Minor Patient Management []  - 0 Isolation Patient Management []  - 0 Hearing / Language / Visual special needs []  - 0 Assessment of Community assistance (transportation, D/C planning, etc.) []  - 0 Additional assistance / Altered mentation []  - 0 Support Surface(s) Assessment (bed, cushion, seat, etc.) INTERVENTIONS - Miscellaneous []  - 0 External ear exam []  - 0 Patient Transfer (multiple staff / / Similar devices) []  - 0 Simple Staple / Suture removal (25 or  less) []  - 0 Complex Staple / Suture removal (26 or more) []  - 0 Hypo/Hyperglycemic Management (do not check if billed separately) []  - 0 Ankle / Brachial Index (ABI) - do not check if billed separately Has the patient been seen at the hospital within  the last three years: Yes Total Score: 85 Level Of Care: New/Established - Level 3 Electronic Signature(s) Signed: 05/30/2021 5:10:19 PM By: [name] Entered By: [name] on 05/30/2021 12:00:29 -------------------------------------------------------------------------------- Encounter Discharge Information Details Patient Name: Date of Service: [name] Baird, [name] NA S. 05/30/2021 9:00 A Baird Medical Record Number: [id number] Patient Account Number: [id number] Date of Birth/Sex: Treating RN: 09/14/1933 (87 y.o. Female) Zenaida Deed Primary Care Oletta Buehring: Lennette Bihari [facility] Other Clinician: Referring Perley Arthurs: Treating Macai Sisneros/Extender: Neita Garnet SEPH Weeks in Treatment: 0 Encounter Discharge Information Items Post Procedure Vitals Discharge Condition: Stable Temperature (F): 98 Ambulatory Status: Wheelchair Pulse (bpm): 73 Discharge Destination: Home Respiratory Rate (breaths/min): 18 Transportation: Private Auto Blood Pressure (mmHg): 148/64 Accompanied By: son, dgt in law Schedule Follow-up Appointment: Yes Clinical Summary of Care: Patient Declined Electronic Signature(s) Signed: 05/30/2021 6:16:40 PM By: Zenaida Deed RN, BSN Entered By: Zenaida Deed on 05/30/2021 10:28:56 -------------------------------------------------------------------------------- Lower Extremity Assessment Details Patient Name: Date of Service: [name] Baird, [name] Baird Medical Record Number: [id number] Patient Account Number: [id number] Date of Birth/Sex: Treating RN: [birth year] (87 y.o. Female) Fonnie Mu Primary Care Tesslyn Baumert: Lennette Bihari [facility] Other Clinician: Referring Liliyana Thobe: Treating Genessis Flanary/Extender:  Neita Garnet SEPH Weeks in Treatment: 0 Edema Assessment Assessed: [Left: No] [Right: Yes] E[Left: dema] [Right: :] Calf Left: Right: Point of Measurement: From Medial Instep 33 cm Ankle Left: Right: Point of Measurement: From Medial Instep 18 cm Vascular Assessment Pulses: Dorsalis Pedis Palpable: [Right:Yes] Posterior Tibial Palpable: [Right:Yes] Electronic Signature(s) Signed: 06/04/2021 6:22:58 PM By: Fonnie Mu RN Entered By: Fonnie Mu on 05/30/2021 09:37:49 -------------------------------------------------------------------------------- Multi Wound Chart Details Patient Name: Date of Service: [name] Baird, [name] NA S. 05/30/2021 9:00 A Baird Medical Record Number: [id number] Patient Account Number: [id number] Date of Birth/Sex: Treating RN: Mar 26, 1933 (87 y.o. Female) [name] Primary Care Faisal Stradling: Lennette Bihari [facility] Other Clinician: Referring Kylynn Street: Treating Fredrik Mogel/Extender: Neita Garnet SEPH Weeks in Treatment: 0 Photos: [1:No Photos Right, Lateral Malleolus] [N/A:N/A N/A] Wound Location: [1:Trauma] [N/A:N/A] Wounding Event: [1:Pressure Ulcer] [N/A:N/A] Primary Etiology: [1:Glaucoma, Arrhythmia, Congestive] [N/A:N/A] Comorbid History: [1:Heart Failure, Hypertension 01/21/2021] [N/A:N/A] Date Acquired: [1:0] [N/A:N/A] Weeks of Treatment: [1:Open] [N/A:N/A] Wound Status: [1:0.2x0.3x0.2] [N/A:N/A] Measurements L x W x D (cm) [1:0.047] [N/A:N/A] A (cm) : rea [1:0.009] [N/A:N/A] Volume (cm) : [1:Unstageable/Unclassified] [N/A:N/A] Classification: [1:Medium] [N/A:N/A] Exudate A mount: [1:Serosanguineous] [N/A:N/A] Exudate Type: [1:red, brown] [N/A:N/A] Exudate Color: [1:Distinct, outline attached] [N/A:N/A] Wound Margin: [1:None Present (0%)] [N/A:N/A] Granulation A mount: [1:Large (67-100%)] [N/A:N/A] Necrotic A mount: [1:Eschar, Adherent Slough] [N/A:N/A] Necrotic Tissue: [1:Fascia: No] [N/A:N/A] Exposed  Structures: [1:Fat Layer (Subcutaneous Tissue): No Tendon: No Muscle: No Joint: No Bone: No None] [N/A:N/A] Epithelialization: [1:Debridement - Selective/Open Wound] [N/A:N/A] Debridement: Pre-procedure Verification/Time Out 10:05 [N/A:N/A] Taken: [1:Other] [N/A:N/A] Pain Control: [1:Skin/Dermis] [N/A:N/A] Level: [1:0.06] [N/A:N/A] Debridement A (sq cm): [1:rea Other(Gauze)] [N/A:N/A] Instrument: [1:None] [N/A:N/A] Bleeding: [1:Procedure was tolerated well] [N/A:N/A] Debridement Treatment Response: [1:0.2x0.3x0.2] [N/A:N/A] Post Debridement Measurements L x W x D (cm) [1:0.009] [N/A:N/A] Post Debridement Volume: (cm) [1:Unstageable/Unclassified] [N/A:N/A] Post Debridement Stage: [1:Debridement] [N/A:N/A] Treatment Notes Wound #1 (Malleolus) Wound Laterality: Right, Lateral Cleanser Wound Cleanser Discharge Instruction: Cleanse the wound with wound cleanser prior to applying a clean dressing using gauze sponges, not tissue or cotton balls. Soap and Water Discharge Instruction: May shower and wash wound with dial antibacterial soap and water prior to dressing change. Peri-Wound Care Skin Prep Discharge Instruction: Use skin prep as directed Topical Primary Dressing Promogran Prisma Matrix,  4.34 (sq in) (silver collagen) Discharge Instruction: Moisten collagen with saline or hydrogel Secondary Dressing Woven Gauze Sponges 2x2 in Discharge Instruction: Apply over primary dressing as directed. Zetuvit Plus Silicone Border Dressing 4x4 (in/in) Discharge Instruction: Apply silicone border over primary dressing as directed. Secured With Compression Wrap Compression Stockings Add-Ons Electronic Signature(s) Signed: 05/30/2021 12:27:58 PM By: Geralyn CorwinHoffman, Jessica DO Signed: 05/30/2021 5:10:19 PM By: Bo McclintockBarnhart, Jodi Entered By: Geralyn CorwinHoffman, Jessica on 05/30/2021 12:11:55 -------------------------------------------------------------------------------- Multi-Disciplinary Care Plan  Details Patient Name: [name] 05/30/2021 9:00 A Baird Medical Record Number: [id number] Patient Account Number: [id number] Date of Birth/Sex: Treating RN: [birth year] (87 y.o. Female) Antonieta IbaBarnhart, Jodi Primary Care Dorean Hiebert: Lennette BihariGRA Y, JO [name] Other Clinician: Referring Zahlia Deshazer: Treating Kareemah Grounds/Extender: Neita GarnetHoffman, Jessica GRA Y, JO SEPH Weeks in Treatment: 0 Active Inactive Orientation to the Wound Care Program Nursing Diagnoses: Knowledge deficit related to the wound healing center program Goals: Patient/caregiver will verbalize understanding of the Wound Healing Center Program Date Initiated: 05/30/2021 Target Resolution Date: 06/27/2021 Goal Status: Active Interventions: Provide education on orientation to the wound center Notes: Pressure Nursing Diagnoses: Knowledge deficit related to management of pressures ulcers Goals: Patient/caregiver will verbalize understanding of pressure ulcer management Date Initiated: 05/30/2021 Target Resolution Date: 06/27/2021 Goal Status: Active Interventions: Assess: immobility, friction, shearing, incontinence upon admission and as needed Assess offloading mechanisms upon admission and as needed Assess potential for pressure ulcer upon admission and as needed Provide education on pressure ulcers Notes: Wound/Skin Impairment Nursing Diagnoses: Impaired tissue integrity Goals: Patient/caregiver will verbalize understanding of skin care regimen Date Initiated: 05/30/2021 Target Resolution Date: 06/27/2021 Goal Status: Active Ulcer/skin breakdown will have a volume reduction of 30% by week 4 Date Initiated: 05/30/2021 Target Resolution Date: 06/27/2021 Goal Status: Active Interventions: Assess patient/caregiver ability to obtain necessary supplies Assess patient/caregiver ability to perform ulcer/skin care regimen upon admission and as needed Assess ulceration(s) every visit Provide education on ulcer and skin care Treatment  Activities: Skin care regimen initiated : 05/30/2021 Topical wound management initiated : 05/30/2021 Notes: Electronic Signature(s) Signed: 05/30/2021 9:08:13 AM By: Antonieta IbaBarnhart, Jodi Entered By: Antonieta IbaBarnhart, Jodi on 05/30/2021 09:08:13 -------------------------------------------------------------------------------- Pain Assessment Details Patient Name: Date of Service: [name], [name] NA S. 05/30/2021 9:00 A Baird Medical Record Number: [id number] Patient Account Number: [id number] Date of Birth/Sex: Treating RN: [birth year] (87 y.o. Female) Fonnie MuBreedlove, Lauren Primary Care Carolan Avedisian: Lennette BihariGRA Y, JO [facility] Other Clinician: Referring Jamonta Goerner: Treating Charita Lindenberger/Extender: Neita GarnetHoffman, Jessica GRA Y, JO SEPH Weeks in Treatment: 0 Active Problems Location of Pain Severity and Description of Pain Patient Has Paino No Site Locations Pain Management and Medication Current Pain Management: Electronic Signature(s) Signed: 06/04/2021 6:22:58 PM By: Fonnie MuBreedlove, Lauren RN Entered By: Fonnie MuBreedlove, Lauren on 05/30/2021 09:15:22 -------------------------------------------------------------------------------- Patient/Caregiver Education Details Patient Name: Date of Service: [place], [name] NA S. 6/10/2022andnbsp9:00 A Baird Medical Record Number: [id number] Patient Account Number: [id number] Date of Birth/Gender: Treating RN: [birth year] (87 y.o. Female) Antonieta IbaBarnhart, Jodi Primary Care Physician: Lennette BihariGRA Y, JO [facility] Other Clinician: Referring Physician: Treating Physician/Extender: Neita GarnetHoffman, Jessica GRA Y, JO SEPH Weeks in Treatment: 0 Education Assessment Education Provided To: Patient Education Topics Provided Venous: Methods: Explain/Verbal Responses: State content correctly Welcome T [facility]: o Handouts: Welcome T [facility] o Methods: Explain/Verbal, Printed Responses: State content correctly Wound/Skin Impairment: Methods: Demonstration, Explain/Verbal, Printed Responses: State content  correctly Electronic Signature(s) Signed: 05/30/2021 5:10:19 PM By: Antonieta IbaBarnhart, Jodi Entered By: Antonieta IbaBarnhart, Jodi on 05/30/2021 09:53:18 -------------------------------------------------------------------------------- Wound Assessment Details Patient Name: Date of Service: [name] MeckelBUSICK, [name] NA S. 05/30/2021  9:00 A Baird Medical Record Number: [id number] Patient Account Number: [id number] Date of Birth/Sex: Treating RN: [birth year] (87 y.o. Female) Fonnie Mu Primary Care Benen Weida: Lennette Bihari [facility] Other Clinician: Referring Erinn Huskins: Treating Gaby Harney/Extender: Neita Garnet SEPH Weeks in Treatment: 0 Wound Status Wound Number: 1 Primary Etiology: Pressure Ulcer Wound Location: Right, Lateral Malleolus Wound Status: Open Wounding Event: Trauma Comorbid Glaucoma, Arrhythmia, Congestive Heart Failure, History: Hypertension Date Acquired: 01/21/2021 Weeks Of Treatment: 0 Clustered Wound: No Photos Wound Measurements Length: (cm) 0.2 Width: (cm) 0.3 Depth: (cm) 0.2 Area: (cm) 0.047 Volume: (cm) 0.009 % Reduction in Area: 0% % Reduction in Volume: 0% Epithelialization: None Tunneling: No Undermining: No Wound Description Classification: Unstageable/Unclassified Wound Margin: Distinct, outline attached Exudate Amount: Medium Exudate Type: Serosanguineous Exudate Color: red, brown Foul Odor After Cleansing: No Slough/Fibrino Yes Wound Bed Granulation Amount: None Present (0%) Exposed Structure Necrotic Amount: Large (67-100%) Fascia Exposed: No Necrotic Quality: Eschar, Adherent Slough Fat Layer (Subcutaneous Tissue) Exposed: No Tendon Exposed: No Muscle Exposed: No Joint Exposed: No Bone Exposed: No Treatment Notes Wound #1 (Malleolus) Wound Laterality: Right, Lateral Cleanser Wound Cleanser Discharge Instruction: Cleanse the wound with wound cleanser prior to applying a clean dressing using gauze sponges, not tissue or cotton balls. Soap and Water Discharge  Instruction: May shower and wash wound with dial antibacterial soap and water prior to dressing change. Peri-Wound Care Skin Prep Discharge Instruction: Use skin prep as directed Topical Primary Dressing Promogran Prisma Matrix, 4.34 (sq in) (silver collagen) Discharge Instruction: Moisten collagen with saline or hydrogel Secondary Dressing Woven Gauze Sponges 2x2 in Discharge Instruction: Apply over primary dressing as directed. Zetuvit Plus Silicone Border Dressing 4x4 (in/in) Discharge Instruction: Apply silicone border over primary dressing as directed. Secured With Compression Wrap Compression Stockings Facilities manager) Signed: 05/30/2021 4:58:44 PM By: Karl Ito Signed: 06/04/2021 6:22:58 PM By: Fonnie Mu RN Entered By: Karl Ito on 05/30/2021 16:35:59

## 2021-06-13 ENCOUNTER — Other Ambulatory Visit: Payer: Self-pay

## 2021-06-13 ENCOUNTER — Encounter (HOSPITAL_BASED_OUTPATIENT_CLINIC_OR_DEPARTMENT_OTHER): Payer: Medicare Other | Admitting: Internal Medicine

## 2021-06-13 ENCOUNTER — Encounter: Payer: Self-pay | Admitting: Nurse Practitioner

## 2021-06-13 ENCOUNTER — Ambulatory Visit (INDEPENDENT_AMBULATORY_CARE_PROVIDER_SITE_OTHER): Payer: Medicare Other | Admitting: Nurse Practitioner

## 2021-06-13 VITALS — BP 122/78 | HR 85 | Temp 98.6°F | Ht 62.0 in | Wt 158.0 lb

## 2021-06-13 DIAGNOSIS — I10 Essential (primary) hypertension: Secondary | ICD-10-CM

## 2021-06-13 DIAGNOSIS — L97319 Non-pressure chronic ulcer of right ankle with unspecified severity: Secondary | ICD-10-CM

## 2021-06-13 DIAGNOSIS — R7303 Prediabetes: Secondary | ICD-10-CM | POA: Diagnosis not present

## 2021-06-13 DIAGNOSIS — N1832 Chronic kidney disease, stage 3b: Secondary | ICD-10-CM | POA: Diagnosis not present

## 2021-06-13 DIAGNOSIS — E7800 Pure hypercholesterolemia, unspecified: Secondary | ICD-10-CM | POA: Diagnosis not present

## 2021-06-13 DIAGNOSIS — L89512 Pressure ulcer of right ankle, stage 2: Secondary | ICD-10-CM

## 2021-06-13 NOTE — Assessment & Plan Note (Signed)
BP Readings from Last 3 Encounters:  06/13/21 122/78  05/26/21 126/78  03/07/21 113/73   -BP well controlled

## 2021-06-13 NOTE — Assessment & Plan Note (Signed)
Lab Results  Component Value Date   CHOL 128 05/26/2021   HDL 55 05/26/2021   LDLCALC 52 05/26/2021   TRIG 117 05/26/2021   CHOLHDL 2.2 06/03/2020   -LDL is great at 52 -continue rosuvastatin

## 2021-06-13 NOTE — Assessment & Plan Note (Addendum)
-  Left fifth toe looks pink like a stage 1 starting; recommend padding while wearing shoes and boot at night that was recommended previously  -left ankle wound is healing, she is wearing foam dressing and seeing wound care  -has pain that keeps her up at night; takes tramadol occasionally -ok to continue this; can't take NSAIDs d/t renal function and is taking tylenol now

## 2021-06-13 NOTE — Assessment & Plan Note (Signed)
Lab Results  Component Value Date   HGBA1C 6.2 (H) 05/26/2021   -no need to add or change medicines

## 2021-06-13 NOTE — Progress Notes (Signed)
Established Patient Office Visit  Subjective:  Patient ID: [name] Baird, female    DOB: [birth year]  Age: 87 y.o. MRN: [id number]  CC:  Chief Complaint  Patient presents with   Prediabetes    Follow up     HPI [name] Baird presents for lab follow-up.  Past Medical History:  Diagnosis Date   Acute GI bleeding 03/06/2020   Anxiety    Atrial flutter ([place])    s/p ablation 06/29/2013 by Dr Rayann Heman   CHF (congestive heart failure) ([place])    Complete heart block ([place]) 2007   s/p medtronic pacemaker implantation   Depression    Glaucoma    Hypertension    Pulmonary stenosis sp valvotomy        Shortness of breath    Status post patch closure of ASD     Past Surgical History:  Procedure Laterality Date   ABLATION  06/29/2013   s/p isthmus ablation for atrial flutter by Dr Rayann Heman   AORTIC VALVE REPAIR     ATRIAL FLUTTER ABLATION N/A 06/29/2013   Procedure: ATRIAL FLUTTER ABLATION;  Surgeon: Thompson Grayer, MD;  Location: [facility] CATH LAB;  Service: Cardiovascular;  Laterality: N/A;   CARDIAC CATHETERIZATION N/A 06/27/2015   Procedure: Temporary Pacemaker;  Surgeon: Sanda Klein, MD;  Location: [facility] CV LAB;  Service: Cardiovascular;  Laterality: N/A;   CARDIOVERSION N/A 03/24/2013   Procedure: CARDIOVERSION;  Surgeon: Sanda Klein, MD;  Location: [facility] ENDOSCOPY;  Service: Cardiovascular;  Laterality: N/A;   COLONOSCOPY WITH PROPOFOL Left 03/08/2020   Procedure: COLONOSCOPY WITH PROPOFOL;  Surgeon: Arta Silence, MD;  Location: [facility];  Service: Endoscopy;  Laterality: Left;   EP IMPLANTABLE DEVICE N/A 06/27/2015   Procedure: PPM Generator Changeout;  Surgeon: Sanda Klein, MD;  Location: [facility] CV LAB;  Service: Cardiovascular;  Laterality: N/A;   ESOPHAGOGASTRODUODENOSCOPY (EGD) WITH PROPOFOL Left 03/07/2020   Procedure: ESOPHAGOGASTRODUODENOSCOPY (EGD) WITH PROPOFOL;  Surgeon: Arta Silence, MD;  Location: [facility] ENDOSCOPY;  Service: Endoscopy;  Laterality:  Left;   PACEMAKER INSERTION  2007   Medtronic Adapta ADDRL1, serial # [id number]   pulmonary valvotomy  1985   trigeminal sx     left side    Family History  Problem Relation Age of Onset   Heart failure Mother    Stroke Father    Multiple sclerosis Daughter     Social History   Socioeconomic History   Marital status: Widowed    Spouse name: Not on file   Number of children: 2   Years of education: Not on file   Highest education level: Not on file  Occupational History   Occupation: retired/homemaker  Tobacco Use   Smoking status: Never   Smokeless tobacco: Never  Vaping Use   Vaping Use: Never used  Substance and Sexual Activity   Alcohol use: No   Drug use: No   Sexual activity: Never  Other Topics Concern   Not on file  Social History Narrative   Not on file   Social Determinants of Health   Financial Resource Strain: Low Risk    Difficulty of Paying Living Expenses: Not hard at all  Food Insecurity: No Food Insecurity   Worried About Charity fundraiser in the Last Year: Never true   [place] in the Last Year: Never true  Transportation Needs: No Transportation Needs   Lack of Transportation (Medical): No   Lack of Transportation (Non-Medical): No  Physical Activity: Insufficiently Active  Days of Exercise per Week: 3 days   Minutes of Exercise per Session: 30 min  Stress: No Stress Concern Present   Feeling of Stress : Not at all  Social Connections: Moderately Isolated   Frequency of Communication with Friends and Family: More than three times a week   Frequency of Social Gatherings with Friends and Family: More than three times a week   Attends Religious Services: More than 4 times per year   Active Member of [employer] or Organizations: No   Attends Archivist Meetings: Never   Marital Status: Widowed  Human resources officer Violence: Not At Risk   Fear of Current or Ex-Partner: No   Emotionally Abused: No   Physically Abused: No    Sexually Abused: No    Outpatient Medications Prior to Visit  Medication Sig Dispense Refill   acetaminophen (TYLENOL) 650 MG CR tablet Take 1,300 mg by mouth daily as needed for pain.      bacitracin ophthalmic ointment      calcium-vitamin D (OSCAL) 250-125 MG-UNIT per tablet Take 1 tablet by mouth daily.     candesartan (ATACAND) 8 MG tablet TAKE 1 TABLET BY MOUTH EVERY DAY 90 tablet 3   clonazePAM (KLONOPIN) 0.5 MG tablet Take 0.5 tablets (0.25 mg total) by mouth at bedtime. 30 tablet 1   dorzolamide-timolol (COSOPT) 22.3-6.8 MG/ML ophthalmic solution Place 2 drops into both eyes 2 (two) times daily.     furosemide (LASIX) 40 MG tablet Take 1 tablet (40 mg total) by mouth 2 (two) times daily. 60 tablet 11   latanoprost (XALATAN) 0.005 % ophthalmic solution Place 1 drop into both eyes at bedtime.     Multiple Vitamins-Minerals (MULTIVITAMIN PO) Take 1 tablet by mouth daily.      nebivolol (BYSTOLIC) 10 MG tablet Take 1 tablet (10 mg total) by mouth daily. 90 tablet 3   omeprazole (PRILOSEC) 20 MG capsule Take 1 capsule (20 mg total) by mouth daily. 90 capsule 1   potassium chloride (KLOR-CON) 10 MEQ tablet Take 2 tablets (20 mEq total) by mouth 2 (two) times daily. 120 tablet 11   RHOPRESSA 0.02 % SOLN Place 1 drop into the right eye every other day.     rosuvastatin (CRESTOR) 10 MG tablet Take 1 tablet (10 mg total) by mouth at bedtime. 30 tablet 3   traMADol (ULTRAM) 50 MG tablet Take 1 tablet (50 mg total) by mouth every 12 (twelve) hours as needed. 10 tablet 0   UNABLE TO FIND Med Name: Heel protector boot for right ankle Apply at bedtime to promote healing of your pressure ulcer. 1 each 0   venlafaxine XR (EFFEXOR-XR) 75 MG 24 hr capsule Take 1 capsule (75 mg total) by mouth daily. 30 capsule 3   XARELTO 15 MG TABS tablet TAKE 1 TABLET (15 MG TOTAL) BY MOUTH DAILY WITH SUPPER. 90 tablet 1   doxycycline (VIBRA-TABS) 100 MG tablet Take 100 mg by mouth 2 (two) times daily. (Patient not  taking: Reported on 06/13/2021)     No facility-administered medications prior to visit.    Allergies  Allergen Reactions   Sulfa Antibiotics Other (See Comments)    welps    Hydrocodone Nausea Only    nausea    ROS Review of Systems  Constitutional: Negative.   Respiratory: Negative.    Cardiovascular: Negative.   Skin:  Positive for wound.       To left ankle; healing; has been seeing wound care  Psychiatric/Behavioral: Negative.  Objective:    Physical Exam Constitutional:      Appearance: Normal appearance.  Cardiovascular:     Rate and Rhythm: Normal rate and regular rhythm.     Pulses: Normal pulses.     Heart sounds: Normal heart sounds.  Pulmonary:     Effort: Pulmonary effort is normal.     Breath sounds: Normal breath sounds.  Skin:    Comments: Left ankle pressure ulcer healing; size of dime; yellow wound bed in center; dressed with foam bandage  Left fifth toe looks pink like a stage 1 starting  Neurological:     Mental Status: She is alert.  Psychiatric:        Mood and Affect: Mood normal.        Behavior: Behavior normal.        Thought Content: Thought content normal.        Judgment: Judgment normal.    BP 122/78 (BP Location: Right Arm, Patient Position: Sitting, Cuff Size: Large)   Pulse 85   Temp 98.6 F (37 C) (Temporal)   Ht 5' 2"  (1.575 m)   Wt 158 lb (71.7 kg)   SpO2 98%   BMI 28.90 kg/m  Wt Readings from Last 3 Encounters:  06/13/21 158 lb (71.7 kg)  05/26/21 158 lb (71.7 kg)  05/12/21 160 lb (72.6 kg)     Health Maintenance Due  Topic Date Due   TETANUS/TDAP  Never done   Zoster Vaccines- Shingrix (1 of 2) Never done   DEXA SCAN  Never done   PNA vac Low Risk Adult (1 of 2 - PCV13) Never done   COVID-19 Vaccine (4 - Booster for Pfizer series) 01/14/2021    There are no preventive care reminders to display for this patient.  Lab Results  Component Value Date   TSH 4.720 (H) 06/03/2020   Lab Results   Component Value Date   WBC 8.6 05/26/2021   HGB 12.9 05/26/2021   HCT 41.2 05/26/2021   MCV 86 05/26/2021   PLT 220 02/28/2021   Lab Results  Component Value Date   NA 138 05/26/2021   K 5.1 05/26/2021   CO2 22 05/26/2021   GLUCOSE 112 (H) 05/26/2021   BUN 24 05/26/2021   CREATININE 1.28 (H) 05/26/2021   BILITOT 0.9 05/26/2021   ALKPHOS 109 05/26/2021   AST 22 05/26/2021   ALT 12 05/26/2021   PROT 7.0 05/26/2021   ALBUMIN 4.2 05/26/2021   CALCIUM 10.1 05/26/2021   ANIONGAP 11 03/08/2020   EGFR 41 (L) 05/26/2021   GFR 47.07 (L) 06/26/2013   Lab Results  Component Value Date   CHOL 128 05/26/2021   Lab Results  Component Value Date   HDL 55 05/26/2021   Lab Results  Component Value Date   LDLCALC 52 05/26/2021   Lab Results  Component Value Date   TRIG 117 05/26/2021   Lab Results  Component Value Date   CHOLHDL 2.2 06/03/2020   Lab Results  Component Value Date   HGBA1C 6.2 (H) 05/26/2021      Assessment & Plan:   Problem List Items Addressed This Visit       Cardiovascular and Mediastinum   High blood pressure    BP Readings from Last 3 Encounters:  06/13/21 122/78  05/26/21 126/78  03/07/21 113/73  -BP well controlled       Relevant Orders   CBC with Differential/Platelet   CMP14+EGFR   Lipid Panel With LDL/HDL Ratio  Musculoskeletal and Integument   Pressure ulcer of right ankle, stage 2 (HCC)    -Left fifth toe looks pink like a stage 1 starting; recommend padding while wearing shoes and boot at night that was recommended previously  -left ankle wound is healing, she is wearing foam dressing and seeing wound care  -has pain that keeps her up at night; takes tramadol occasionally -ok to continue this; can't take NSAIDs d/t renal function and is taking tylenol now         Genitourinary   Stage 3b chronic kidney disease ([place])    Lab Results  Component Value Date   CREATININE 1.28 (H) 05/26/2021   BUN 24 05/26/2021   NA 138  05/26/2021   K 5.1 05/26/2021   CL 98 05/26/2021   CO2 22 05/26/2021  -GFR stable at 41       Relevant Orders   CMP14+EGFR     Other   Hypercholesterolemia    Lab Results  Component Value Date   CHOL 128 05/26/2021   HDL 55 05/26/2021   LDLCALC 52 05/26/2021   TRIG 117 05/26/2021   CHOLHDL 2.2 06/03/2020  -LDL is great at 52 -continue rosuvastatin       Relevant Orders   CBC with Differential/Platelet   CMP14+EGFR   Lipid Panel With LDL/HDL Ratio   Prediabetes - Primary    Lab Results  Component Value Date   HGBA1C 6.2 (H) 05/26/2021  -no need to add or change medicines       Relevant Orders   Lipid Panel With LDL/HDL Ratio   Hemoglobin A1c    No orders of the defined types were placed in this encounter.   Follow-up: Return in about 4 months (around 10/13/2021).    Noreene Larsson, NP

## 2021-06-13 NOTE — Patient Instructions (Signed)
Please have fasting labs drawn 2-3 days prior to your appointment so we can discuss the results during your office visit.  

## 2021-06-13 NOTE — Assessment & Plan Note (Signed)
Lab Results  Component Value Date   CREATININE 1.28 (H) 05/26/2021   BUN 24 05/26/2021   NA 138 05/26/2021   K 5.1 05/26/2021   CL 98 05/26/2021   CO2 22 05/26/2021   -GFR stable at 41

## 2021-06-17 ENCOUNTER — Ambulatory Visit
Admission: RE | Admit: 2021-06-17 | Discharge: 2021-06-17 | Disposition: A | Discharge status: Home / Self Care | Payer: Medicare Other | Source: Ambulatory Visit | Attending: Nurse Practitioner | Admitting: Nurse Practitioner

## 2021-06-17 ENCOUNTER — Other Ambulatory Visit: Payer: Self-pay | Admitting: Nurse Practitioner

## 2021-06-17 ENCOUNTER — Other Ambulatory Visit: Payer: Self-pay

## 2021-06-17 DIAGNOSIS — Z1231 Encounter for screening mammogram for malignant neoplasm of breast: Secondary | ICD-10-CM

## 2021-06-17 DIAGNOSIS — F320 Major depressive disorder, single episode, mild: Secondary | ICD-10-CM

## 2021-06-17 NOTE — Progress Notes (Signed)
[name] Baird, [name] Baird ([id number]) Visit Report for 06/13/2021 Arrival Information Details Patient Name: Date of Service: [name], [name] 06/13/2021 11:15 A M Medical Record Number: [id number] Patient Account Number: [id number] Date of Birth/Sex: Treating RN: 1933/08/02 (87 y.o. Female) Zenaida Deed Primary Care Damone Fancher: Bjorn Pippin Other Clinician: Referring Diann Bangerter: Treating Ausencio Vaden/Extender: Tilda Franco in Treatment: 2 Visit Information History Since Last Visit Added or deleted any medications: No Patient Arrived: Wheel Chair Any new allergies or adverse reactions: No Arrival Time: 11:45 Had a fall or experienced change in No Accompanied By: daughter activities of daily living that may affect Transfer Assistance: None risk of falls: Patient Identification Verified: Yes Signs or symptoms of abuse/neglect since last visito No Secondary Verification Process Completed: Yes Hospitalized since last visit: No Patient Requires Transmission-Based Precautions: No Implantable device outside of the clinic excluding No Patient Has Alerts: No cellular tissue based products placed in the center since last visit: Has Dressing in Place as Prescribed: Yes Pain Present Now: No Electronic Signature(s) Signed: 06/13/2021 12:34:28 PM By: Karl Ito Entered By: Karl Ito on 06/13/2021 11:45:42 -------------------------------------------------------------------------------- Clinic Level of Care Assessment Details Patient Name: Date of Service: [name], [name] 06/13/2021 11:15 A M Medical Record Number: [id number] Patient Account Number: [id number] Date of Birth/Sex: Treating RN: Jan 10, 1933 (87 y.o. Female) Zandra Abts Primary Care Zarif Rathje: Bjorn Pippin Other Clinician: Referring Maston Wight: Treating Bastien Strawser/Extender: Tilda Franco in Treatment: 2 Clinic Level of Care Assessment Items TOOL 4 Quantity Score X- 1 0 Use when only an EandM is performed on  FOLLOW-UP visit ASSESSMENTS - Nursing Assessment / Reassessment X- 1 10 Reassessment of Co-morbidities (includes updates in patient status) X- 1 5 Reassessment of Adherence to Treatment Plan ASSESSMENTS - Wound and Skin A ssessment / Reassessment X - Simple Wound Assessment / Reassessment - one wound 1 5 []  - 0 Complex Wound Assessment / Reassessment - multiple wounds []  - 0 Dermatologic / Skin Assessment (not related to wound area) ASSESSMENTS - Focused Assessment []  - 0 Circumferential Edema Measurements - multi extremities []  - 0 Nutritional Assessment / Counseling / Intervention X- 1 5 Lower Extremity Assessment (monofilament, tuning fork, pulses) []  - 0 Peripheral Arterial Disease Assessment (using hand held doppler) ASSESSMENTS - Ostomy and/or Continence Assessment and Care []  - 0 Incontinence Assessment and Management []  - 0 Ostomy Care Assessment and Management (repouching, etc.) PROCESS - Coordination of Care X - Simple Patient / Family Education for ongoing care 1 15 []  - 0 Complex (extensive) Patient / Family Education for ongoing care X- 1 10 Staff obtains , Records, T Results / Process Orders est []  - 0 Staff telephones HHA, Nursing Homes / Clarify orders / etc []  - 0 Routine Transfer to another Facility (non-emergent condition) []  - 0 Routine Hospital Admission (non-emergent condition) []  - 0 New Admissions / / Ordering NPWT Apligraf, etc. , []  - 0 Emergency Hospital Admission (emergent condition) X- 1 10 Simple Discharge Coordination []  - 0 Complex (extensive) Discharge Coordination PROCESS - Special Needs []  - 0 Pediatric / Minor Patient Management []  - 0 Isolation Patient Management []  - 0 Hearing / Language / Visual special needs []  - 0 Assessment of Community assistance (transportation, D/C planning, etc.) []  - 0 Additional assistance / Altered mentation []  - 0 Support Surface(s) Assessment (bed, cushion,  seat, etc.) INTERVENTIONS - Wound Cleansing / Measurement X - Simple Wound Cleansing - one wound 1 5 []  - 0 Complex Wound Cleansing - multiple wounds X- 1 5  Wound Imaging (photographs - any number of wounds) []  - 0 Wound Tracing (instead of photographs) X- 1 5 Simple Wound Measurement - one wound []  - 0 Complex Wound Measurement - multiple wounds INTERVENTIONS - Wound Dressings X - Small Wound Dressing one or multiple wounds 1 10 []  - 0 Medium Wound Dressing one or multiple wounds []  - 0 Large Wound Dressing one or multiple wounds X- 1 5 Application of Medications - topical []  - 0 Application of Medications - injection INTERVENTIONS - Miscellaneous []  - 0 External ear exam []  - 0 Specimen Collection (cultures, biopsies, blood, body fluids, etc.) []  - 0 Specimen(s) / Culture(s) sent or taken to Lab for analysis []  - 0 Patient Transfer (multiple staff / Nurse, adultHoyer Lift / Similar devices) []  - 0 Simple Staple / Suture removal (25 or less) []  - 0 Complex Staple / Suture removal (26 or more) []  - 0 Hypo / Hyperglycemic Management (close monitor of Blood Glucose) []  - 0 Ankle / Brachial Index (ABI) - do not check if billed separately X- 1 5 Vital Signs Has the patient been seen at the hospital within the last three years: Yes Total Score: 95 Level Of Care: New/Established - Level 3 Electronic Signature(s) Signed: 06/17/2021 5:39:04 PM By: Zandra AbtsLynch, Shatara RN, BSN Entered By: Zandra AbtsLynch, Shatara on 06/13/2021 12:16:59 -------------------------------------------------------------------------------- Encounter Discharge Information Details Patient Name: [name] 06/13/2021 11:15 A M Medical Record Number: [id number] Patient Account Number: [id number] Date of Birth/Sex: Treating RN: Apr 14, 1933 (87 y.o. Female) Zandra AbtsLynch, Shatara Primary Care Reshunda Strider: Bjorn PippinGray, Joseph Other Clinician: Referring Naja Apperson: Treating Annette Bertelson/Extender: Tilda FrancoHoffman, Jessica Weeks in Treatment:  2 Encounter Discharge Information Items Discharge Condition: Stable Ambulatory Status: Wheelchair Discharge Destination: Home Transportation: Private Auto Accompanied By: alone Schedule Follow-up Appointment: Yes Clinical Summary of Care: Patient Declined Electronic Signature(s) Signed: 06/17/2021 5:39:04 PM By: Zandra AbtsLynch, Shatara RN, BSN Entered By: Zandra AbtsLynch, Shatara on 06/13/2021 12:18:51 -------------------------------------------------------------------------------- Lower Extremity Assessment Details Patient Name: [name] 06/13/2021 11:15 A M Medical Record Number: [id number] Patient Account Number: [id number] Date of Birth/Sex: Treating RN: Apr 14, 1933 (87 y.o. Female) Zandra AbtsLynch, Shatara Primary Care Percilla Tweten: Bjorn PippinGray, Joseph Other Clinician: Referring Michial Disney: Treating Daisha Filosa/Extender: Tilda FrancoHoffman, Jessica Weeks in Treatment: 2 Edema Assessment Assessed: [Left: No] [Right: No] [Left: Edema] [Right: :] Calf Left: Right: Point of Measurement: From Medial Instep 33 cm Ankle Left: Right: Point of Measurement: From Medial Instep 18 cm Vascular Assessment Pulses: Dorsalis Pedis Palpable: [Right:Yes] Electronic Signature(s) Signed: 06/17/2021 5:39:04 PM By: Zandra AbtsLynch, Shatara RN, BSN Entered By: Zandra AbtsLynch, Shatara on 06/13/2021 11:51:14 -------------------------------------------------------------------------------- Multi Wound Chart Details Patient Name: Date of Service: [name] 06/13/2021 11:15 A M Medical Record Number: [id number] Patient Account Number: [id number] Date of Birth/Sex: Treating RN: Apr 14, 1933 (87 y.o. Female) Zenaida DeedBoehlein, Linda Primary Care Maikayla Beggs: Bjorn PippinGray, Joseph Other Clinician: Referring Haidar Muse: Treating Lenola Lockner/Extender: Tilda FrancoHoffman, Jessica Weeks in Treatment: 2 Vital Signs Height(in): Pulse(bpm): 74 Weight(lbs): Blood Pressure(mmHg): 134/80 Body Mass Index(BMI): Temperature(F): 98.7 Respiratory Rate(breaths/min): 18 Photos: [1:No  Photos Right, Lateral Malleolus] [N/A:N/A N/A] Wound Location: [1:Trauma] [N/A:N/A] Wounding Event: [1:Pressure Ulcer] [N/A:N/A] Primary Etiology: [1:Glaucoma, Arrhythmia, Congestive] [N/A:N/A] Comorbid History: [1:Heart Failure, Hypertension 01/21/2021] [N/A:N/A] Date Acquired: [1:2] [N/A:N/A] Weeks of Treatment: [1:Open] [N/A:N/A] Wound Status: [1:0.3x0.3x0.1] [N/A:N/A] Measurements L x W x D (cm) [1:0.071] [N/A:N/A] A (cm) : rea [1:0.007] [N/A:N/A] Volume (cm) : [1:-51.10%] [N/A:N/A] % Reduction in A rea: [1:22.20%] [N/A:N/A] % Reduction in Volume: [1:Unstageable/Unclassified] [N/A:N/A] Classification: [1:Medium] [N/A:N/A] Exudate A mount: [1:Serosanguineous] [N/A:N/A] Exudate Type: [1:red,  brown] [N/A:N/A] Exudate Color: [1:Distinct, outline attached] [N/A:N/A] Wound Margin: [1:None Present (0%)] [N/A:N/A] Granulation A mount: [1:Large (67-100%)] [N/A:N/A] Necrotic A mount: [1:Fascia: No] [N/A:N/A] Exposed Structures: [1:Fat Layer (Subcutaneous Tissue): No Tendon: No Muscle: No Joint: No Bone: No Small (1-33%)] [N/A:N/A] Treatment Notes Electronic Signature(s) Signed: 06/13/2021 12:17:37 PM By: Geralyn Corwin DO Signed: 06/13/2021 6:17:32 PM By: Zenaida Deed RN, BSN Entered By: Geralyn Corwin on 06/13/2021 12:13:43 -------------------------------------------------------------------------------- Multi-Disciplinary Care Plan Details Patient Name: Date of Service: [place], [place] S. 06/13/2021 11:15 A M Medical Record Number: [id number] Patient Account Number: [id number] Date of Birth/Sex: Treating RN: 09-Sep-1933 (87 y.o. Female) Zandra Abts Primary Care Myers Tutterow: Bjorn Pippin Other Clinician: Referring Jessen Siegman: Treating Shuntae Herzig/Extender: Tilda Franco in Treatment: 2 Active Inactive Pressure Nursing Diagnoses: Knowledge deficit related to management of pressures ulcers Goals: Patient/caregiver will verbalize understanding of pressure ulcer  management Date Initiated: 05/30/2021 Target Resolution Date: 06/27/2021 Goal Status: Active Interventions: Assess: immobility, friction, shearing, incontinence upon admission and as needed Assess offloading mechanisms upon admission and as needed Assess potential for pressure ulcer upon admission and as needed Provide education on pressure ulcers Notes: Wound/Skin Impairment Nursing Diagnoses: Impaired tissue integrity Goals: Patient/caregiver will verbalize understanding of skin care regimen Date Initiated: 05/30/2021 Target Resolution Date: 06/27/2021 Goal Status: Active Ulcer/skin breakdown will have a volume reduction of 30% by week 4 Date Initiated: 05/30/2021 Target Resolution Date: 06/27/2021 Goal Status: Active Interventions: Assess patient/caregiver ability to obtain necessary supplies Assess patient/caregiver ability to perform ulcer/skin care regimen upon admission and as needed Assess ulceration(s) every visit Provide education on ulcer and skin care Treatment Activities: Skin care regimen initiated : 05/30/2021 Topical wound management initiated : 05/30/2021 Notes: Electronic Signature(s) Signed: 06/17/2021 5:39:04 PM By: Zandra Abts RN, BSN Entered By: Zandra Abts on 06/13/2021 12:01:28 -------------------------------------------------------------------------------- Pain Assessment Details Patient Name: Date of Service: [name]. 06/13/2021 11:15 A M Medical Record Number: [id number] Patient Account Number: [id number] Date of Birth/Sex: Treating RN: 03-07-1933 (87 y.o. Female) Zenaida Deed Primary Care Daaiel Starlin: Bjorn Pippin Other Clinician: Referring Brittan Butterbaugh: Treating Mar Walmer/Extender: Tilda Franco in Treatment: 2 Active Problems Location of Pain Severity and Description of Pain Patient Has Paino No Site Locations Pain Management and Medication Current Pain Management: Electronic Signature(s) Signed: 06/13/2021 12:34:28 PM By:  Karl Ito Signed: 06/13/2021 6:17:32 PM By: Zenaida Deed RN, BSN Entered By: Karl Ito on 06/13/2021 11:46:03 -------------------------------------------------------------------------------- Patient/Caregiver Education Details Patient Name: Date of Service: Leda Roys 6/24/2022andnbsp11:15 A M Medical Record Number: [id number] Patient Account Number: [id number] Date of Birth/Gender: Treating RN: 02-14-1933 (87 y.o. Female) Zandra Abts Primary Care Physician: Bjorn Pippin Other Clinician: Referring Physician: Treating Physician/Extender: Tilda Franco in Treatment: 2 Education Assessment Education Provided To: Patient Education Topics Provided Wound/Skin Impairment: Methods: Explain/Verbal Responses: State content correctly Electronic Signature(s) Signed: 06/17/2021 5:39:04 PM By: Zandra Abts RN, BSN Entered By: Zandra Abts on 06/13/2021 12:01:42 -------------------------------------------------------------------------------- Wound Assessment Details Patient Name: Date of Service: [name]. 06/13/2021 11:15 A M Medical Record Number: [id number] Patient Account Number: [id number] Date of Birth/Sex: Treating RN: 01-01-1933 (87 y.o. Female) Zenaida Deed Primary Care Caryn Gienger: Bjorn Pippin Other Clinician: Referring Matias Thurman: Treating Carvin Almas/Extender: Tilda Franco in Treatment: 2 Wound Status Wound Number: 1 Primary Etiology: Pressure Ulcer Wound Location: Right, Lateral Malleolus Wound Status: Open Wounding Event: Trauma Comorbid Glaucoma, Arrhythmia, Congestive Heart Failure, History: Hypertension Date Acquired: 01/21/2021 Weeks Of Treatment: 2 Clustered Wound: No Photos Wound Measurements Length: (cm) 0.3 Width: (cm) 0.3 Depth: (cm) 0.1 Area: (  cm) 0.071 Volume: (cm) 0.007 % Reduction in Area: -51.1% % Reduction in Volume: 22.2% Epithelialization: Small (1-33%) Wound Description Classification:  Unstageable/Unclassified Wound Margin: Distinct, outline attached Exudate Amount: Medium Exudate Type: Serosanguineous Exudate Color: red, brown Foul Odor After Cleansing: No Slough/Fibrino Yes Wound Bed Granulation Amount: None Present (0%) Exposed Structure Necrotic Amount: Large (67-100%) Fascia Exposed: No Necrotic Quality: Adherent Slough Fat Layer (Subcutaneous Tissue) Exposed: No Tendon Exposed: No Muscle Exposed: No Joint Exposed: No Bone Exposed: No Treatment Notes Wound #1 (Malleolus) Wound Laterality: Right, Lateral Cleanser Soap and Water Discharge Instruction: May shower and wash wound with dial antibacterial soap and water prior to dressing change. Wound Cleanser Discharge Instruction: Cleanse the wound with wound cleanser prior to applying a clean dressing using gauze sponges, not tissue or cotton balls. Peri-Wound Care Skin Prep Discharge Instruction: Use skin prep as directed Topical Primary Dressing Promogran Prisma Matrix, 4.34 (sq in) (silver collagen) Discharge Instruction: Moisten collagen with saline or hydrogel Secondary Dressing Woven Gauze Sponges 2x2 in Discharge Instruction: Apply over primary dressing as directed. Zetuvit Plus Silicone Border Dressing 4x4 (in/in) Discharge Instruction: Apply silicone border over primary dressing as directed. Secured With Compression Wrap Compression Stockings Facilities manager) Signed: 06/13/2021 5:07:58 PM By: Karl Ito Signed: 06/13/2021 6:17:32 PM By: Zenaida Deed RN, BSN Entered By: Karl Ito on 06/13/2021 17:02:33 -------------------------------------------------------------------------------- Vitals Details Patient Name: Date of Service: [name], [name] 06/13/2021 11:15 A M Medical Record Number: [id number] Patient Account Number: [id number] Date of Birth/Sex: Treating RN: [birth year] (87 y.o. Female) Zenaida Deed Primary Care Kerie Badger: Bjorn Pippin Other  Clinician: Referring Ravis Herne: Treating Laurieanne Galloway/Extender: Tilda Franco in Treatment: 2 Vital Signs Time Taken: 11:45 Temperature (F): 98.7 Pulse (bpm): 74 Respiratory Rate (breaths/min): 18 Blood Pressure (mmHg): 134/80 Reference Range: 80 - 120 mg / dl Electronic Signature(s) Signed: 06/13/2021 12:34:28 PM By: Karl Ito Entered By: Karl Ito on 06/13/2021 11:45:58

## 2021-06-17 NOTE — Progress Notes (Signed)
[name] Baird, HOWES ([id number]) Visit Report for 06/13/2021 Chief Complaint Document Details Patient Name: Date of Service: [place], [name] 06/13/2021 11:15 A M Medical Record Number: [id number] Patient Account Number: [id number] Date of Birth/Sex: Treating RN: 06-22-1933 (87 y.o. Female) Zenaida Deed Primary Care Provider: Bjorn Pippin Other Clinician: Referring Provider: Treating Provider/Extender: Tilda Franco in Treatment: 2 Information Obtained from: Patient Chief Complaint Right ankle wound Electronic Signature(s) Signed: 06/13/2021 12:17:37 PM By: Geralyn Corwin DO Entered By: Geralyn Corwin on 06/13/2021 12:13:50 -------------------------------------------------------------------------------- HPI Details Patient Name: Date of Service: [name] Baird, [name] 06/13/2021 11:15 A M Medical Record Number: [id number] Patient Account Number: [id number] Date of Birth/Sex: Treating RN: [birth year] (87 y.o. Female) Zenaida Deed Primary Care Provider: Bjorn Pippin Other Clinician: Referring Provider: Treating Provider/Extender: Tilda Franco in Treatment: 2 History of Present Illness HPI Description: Admission 6/10 [name] is an 87 year old female with a past medical history of paroxysmal A. fib on anticoagulation, congenital heart disease (atrial septal defect status post patch closure), pulmonic stenosis (status post valvotomy 1985), left pulmonary artery aneurysm, complete heart block status post dual-chamber permanent pacemaker implantation that presents to the clinic for a right lateral malleolus wound that developed 3 months ago. She is not quite sure how it started but thinks she may have hit it against her walker. She has been placing Neosporin on this wound daily. She reports pain to the area worse at night and often finds herself laying her leg over the side of the bed. She has been given doxycycline for this issue by her podiatrist. She currently denies  signs of infection. She reports trying to offload the area the best she can daily. 6/24; patient presents for 2-week follow-up. She has been using collagen and activating it with K-Y jelly. She has tolerated this well. She continues to have nocturnal pain at the wound site. She is scheduled to see vein and vascular on 7/6. She denies signs of infection. Electronic Signature(s) Signed: 06/13/2021 12:17:37 PM By: Geralyn Corwin DO Entered By: Geralyn Corwin on 06/13/2021 12:14:49 -------------------------------------------------------------------------------- Physical Exam Details Patient Name: Date of Service: [name] L[place] NA S. 06/13/2021 11:15 A M Medical Record Number: [id number] Patient Account Number: [id number] Date of Birth/Sex: Treating RN: 1933-09-25 (87 y.o. Female) Zenaida Deed Primary Care Provider: Bjorn Pippin Other Clinician: Referring Provider: Treating Provider/Extender: Tilda Franco in Treatment: 2 Constitutional respirations regular, non-labored and within target range for patient.[name] Kitchen Psychiatric pleasant and cooperative. Notes Right ankle: Lateral malleolus with a small open wound that has dried nonviable tissue overlying it. sensitive to the touch. No increased warmth or erythema or drainage noted. No signs of infection. Decreased pedal pulses Electronic Signature(s) Signed: 06/13/2021 12:17:37 PM By: Geralyn Corwin DO Entered By: Geralyn Corwin on 06/13/2021 12:15:38 -------------------------------------------------------------------------------- Physician Orders Details Patient Name: Date of Service: [name] Lango NA S. 06/13/2021 11:15 A M Medical Record Number: [id number] Patient Account Number: [id number] Date of Birth/Sex: Treating RN: [birth year] (87 y.o. 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 - with Dr. Mikey Bussing Return A Bathing/ Shower/ Hygiene May shower and wash wound with soap and water. Edema Control - Lymphedema / SCD /  Other Elevate legs to the level of the heart or above for 30 minutes daily and/or when sitting, a frequency of: - throughout the day Avoid standing for long periods of time. Exercise regularly Additional Orders / Instructions Follow Nutritious Diet Wound Treatment Wound #1 - Malleolus Wound Laterality: Right, Lateral Cleanser: Soap and Water 1 x Per Day/30 Days Discharge Instructions: May shower and wash wound with dial antibacterial soap and water prior to dressing change. Cleanser: Wound Cleanser 1 x Per Day/30 Days Discharge Instructions: Cleanse the wound with wound cleanser prior to applying a clean dressing using gauze sponges, not tissue or cotton balls. Peri-Wound Care: Skin Prep 1 x Per Day/30 Days Discharge Instructions: Use skin prep as directed Prim Dressing: Promogran Prisma Matrix, 4.34 (sq in) (silver collagen) 1 x Per Day/30 Days ary Discharge Instructions: Moisten collagen with saline or hydrogel Secondary Dressing: Woven Gauze Sponges 2x2 in 1 x Per Day/30 Days Discharge Instructions: Apply over primary dressing as directed. Secondary Dressing: Zetuvit Plus Silicone Border Dressing 4x4 (in/in) 1 x Per Day/30 Days Discharge Instructions: Apply silicone border over primary dressing as directed. Electronic Signature(s) Signed: 06/13/2021 12:17:37 PM By: Geralyn CorwinHoffman, Lucynda Rosano DO Entered By: Geralyn CorwinHoffman, Metzli Pollick on 06/13/2021  12:15:49 -------------------------------------------------------------------------------- Problem List Details Patient Name: Date of Service: [name], [name] 06/13/2021 11:15 A M Medical Record Number: [id number] Patient Account Number: [id number] Date of Birth/Sex: Treating RN: March 28, 1933 (87 y.o. 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 05/30/2021 No Yes I48.0 Paroxysmal atrial fibrillation 05/30/2021 No Yes Z79.01 Long term (current) use of anticoagulants 05/30/2021 No Yes Z95.0 Presence of cardiac pacemaker 05/30/2021 No Yes M79.606 Pain in leg, unspecified 05/30/2021 No Yes Inactive Problems Resolved Problems Electronic Signature(s) Signed: 06/13/2021 12:17:37 PM By: Geralyn CorwinHoffman, Ardell Makarewicz DO Entered By: Geralyn CorwinHoffman, Araminta Zorn on 06/13/2021 12:13:37 -------------------------------------------------------------------------------- Progress Note Details Patient Name: Date of Service: [name] [place], [name] 06/13/2021 11:15 A M Medical Record Number: [id number] Patient Account Number: [id number] Date of Birth/Sex: Treating RN: March 28, 1933 (87 y.o. Female) Zenaida DeedBoehlein, Linda Primary Care Provider: Bjorn PippinGray, Joseph Other Clinician: Referring Provider: Treating Provider/Extender: Tilda FrancoHoffman, Kamry Faraci Weeks in Treatment: 2 Subjective Chief Complaint Information obtained from Patient Right ankle wound History of Present Illness (HPI) Admission 6/10 Ms. Ebony HailMona Dusek is an 87 year old female with a past medical history of paroxysmal A. fib on anticoagulation, congenital heart disease (atrial septal defect status post patch closure), pulmonic stenosis (status post valvotomy 1985), left pulmonary artery aneurysm, complete heart block status post  dual-chamber permanent pacemaker implantation that presents to the clinic for a right lateral malleolus wound that developed 3 months ago. She is not quite sure how it started but thinks she may have hit it against her walker. She has been placing Neosporin on this wound daily. She reports pain to the area worse at night and often finds herself laying her leg over the side of the bed. She has been given doxycycline for this issue by her podiatrist. She currently denies signs of infection. She reports trying to offload the area the best she can daily. 6/24; patient presents for 2-week follow-up. She has been using collagen and activating it with K-Y jelly. She has tolerated this well. She continues to have nocturnal pain at the wound site. She is scheduled to see vein and vascular on 7/6. She denies signs of infection. Patient History  Information obtained from Patient. Family History Unknown History. Social History Never smoker, Marital Status - Married, Alcohol Use - Never, Drug Use - No History, Caffeine Use - Rarely. Medical History Eyes Patient has history of Glaucoma Denies history of Cataracts, Optic Neuritis Ear/Nose/Mouth/Throat Denies history of Chronic sinus problems/congestion, Middle ear problems Hematologic/Lymphatic Denies history of Anemia, Hemophilia, Human Immunodeficiency Virus, Lymphedema, Sickle Cell Disease Respiratory Denies history of Aspiration, Asthma, Chronic Obstructive Pulmonary Disease (COPD), Pneumothorax, Sleep Apnea, Tuberculosis Cardiovascular Patient has history of Arrhythmia - atrial flutter, Congestive Heart Failure, Hypertension Denies history of Angina, Coronary Artery Disease, Deep Vein Thrombosis, Hypotension, Myocardial Infarction, Peripheral Arterial Disease, Peripheral Venous Disease, Phlebitis, Vasculitis Gastrointestinal Denies history of Cirrhosis , Colitis, Crohnoos, Hepatitis A, Hepatitis B, Hepatitis C Endocrine Denies history of Type I  Diabetes, Type II Diabetes Genitourinary Denies history of End Stage Renal Disease Immunological Denies history of Lupus Erythematosus, Raynaudoos, Scleroderma Integumentary (Skin) Denies history of History of Burn Musculoskeletal Denies history of Gout, Rheumatoid Arthritis, Osteoarthritis, Osteomyelitis Neurologic Denies history of Dementia, Neuropathy, Quadriplegia, Paraplegia, Seizure Disorder Oncologic Denies history of Received Chemotherapy, Received Radiation Psychiatric Denies history of Anorexia/bulimia, Confinement Anxiety Medical A Surgical History Notes nd Cardiovascular complete heart block s/p medtronic pacemaker status post patch cllosure of ASD Psychiatric anxiety and depression Objective Constitutional respirations regular, non-labored and within target range for patient.. Vitals Time Taken: 11:45 AM, Temperature: 98.7 F, Pulse: 74 bpm, Respiratory Rate: 18 breaths/min, Blood Pressure: 134/80 mmHg. Psychiatric pleasant and cooperative. General Notes: Right ankle: Lateral malleolus with a small open wound that has dried nonviable tissue overlying it. sensitive to the touch. No increased warmth or erythema or drainage noted. No signs of infection. Decreased pedal pulses Integumentary (Hair, Skin) Wound #1 status is Open. Original cause of wound was Trauma. The date acquired was: 01/21/2021. The wound has been in treatment 2 weeks. The wound is located on the Right,Lateral Malleolus. The wound measures 0.3cm length x 0.3cm width x 0.1cm depth; 0.071cm^2 area and 0.007cm^3 volume. There is a medium amount of serosanguineous drainage noted. The wound margin is distinct with the outline attached to the wound base. There is no granulation within the wound bed. There is a large (67-100%) amount of necrotic tissue within the wound bed including Adherent Slough. Assessment Active Problems ICD-10 Non-pressure chronic ulcer of right ankle with unspecified  severity Paroxysmal atrial fibrillation Long term (current) use of anticoagulants Presence of cardiac pacemaker Pain in leg, unspecified Overall patient's wound is stable. I recommended continuing collagen with dressing changes. She is scheduled to see vein and vascular on 7/6 to assess blood flow. If everything looks good we will likely debride the wound at next clinic visit and continue with dressing changes. Plan Follow-up Appointments: Return Appointment in 2 weeks. - with Dr. Mikey Bussing Bathing/ Shower/ Hygiene: May shower and wash wound with soap and water. Edema Control - Lymphedema / SCD / Other: Elevate legs to the level of the heart or above for 30 minutes daily and/or when sitting, a frequency of: - throughout the day Avoid standing for long periods of time. Exercise regularly Additional Orders / Instructions: Follow Nutritious Diet WOUND #1: - Malleolus Wound Laterality: Right, Lateral Cleanser: Soap and Water 1 x Per Day/30 Days Discharge Instructions: May shower and wash wound with dial antibacterial soap and water prior to dressing change. Cleanser: Wound Cleanser 1 x Per Day/30 Days Discharge Instructions: Cleanse the wound with wound cleanser prior to applying a clean dressing using gauze sponges, not tissue or cotton  balls. Peri-Wound Care: Skin Prep 1 x Per Day/30 Days Discharge Instructions: Use skin prep as directed Prim Dressing: Promogran Prisma Matrix, 4.34 (sq in) (silver collagen) 1 x Per Day/30 Days ary Discharge Instructions: Moisten collagen with saline or hydrogel Secondary Dressing: Woven Gauze Sponges 2x2 in 1 x Per Day/30 Days Discharge Instructions: Apply over primary dressing as directed. Secondary Dressing: Zetuvit Plus Silicone Border Dressing 4x4 (in/in) 1 x Per Day/30 Days Discharge Instructions: Apply silicone border over primary dressing as directed. 1. Continue collagen 2. Follow-up in 2 weeks Electronic Signature(s) Signed: 06/13/2021 12:17:37  PM By: Geralyn Corwin DO Entered By: Geralyn Corwin on 06/13/2021 12:16:46 -------------------------------------------------------------------------------- HxROS Details Patient Name: Date of Service: [name] B[place], [name] 06/13/2021 11:15 A M Medical Record Number: [id number] Patient Account Number: [id number] Date of Birth/Sex: Treating RN: [birth year] (87 y.o. Female) Zenaida Deed Primary Care Provider: Bjorn Pippin Other Clinician: Referring Provider: Treating Provider/Extender: Tilda Franco in Treatment: 2 Information Obtained From Patient Eyes Medical History: Positive for: Glaucoma Negative for: Cataracts; Optic Neuritis Ear/Nose/Mouth/Throat Medical History: Negative for: Chronic sinus problems/congestion; Middle ear problems Hematologic/Lymphatic Medical History: Negative for: Anemia; Hemophilia; Human Immunodeficiency Virus; Lymphedema; Sickle Cell Disease Respiratory Medical History: Negative for: Aspiration; Asthma; Chronic Obstructive Pulmonary Disease (COPD); Pneumothorax; Sleep Apnea; Tuberculosis Cardiovascular Medical History: Positive for: Arrhythmia - atrial flutter; Congestive Heart Failure; Hypertension Negative for: Angina; Coronary Artery Disease; Deep Vein Thrombosis; Hypotension; Myocardial Infarction; Peripheral Arterial Disease; Peripheral Venous Disease; Phlebitis; Vasculitis Past Medical History Notes: complete heart block s/p medtronic pacemaker status post patch cllosure of ASD Gastrointestinal Medical History: Negative for: Cirrhosis ; Colitis; Crohns; Hepatitis A; Hepatitis B; Hepatitis C Endocrine Medical History: Negative for: Type I Diabetes; Type II Diabetes Genitourinary Medical History: Negative for: End Stage Renal Disease Immunological Medical History: Negative for: Lupus Erythematosus; Raynauds; Scleroderma Integumentary (Skin) Medical History: Negative for: History of Burn Musculoskeletal Medical  History: Negative for: Gout; Rheumatoid Arthritis; Osteoarthritis; Osteomyelitis Neurologic Medical History: Negative for: Dementia; Neuropathy; Quadriplegia; Paraplegia; Seizure Disorder Oncologic Medical History: Negative for: Received Chemotherapy; Received Radiation Psychiatric Medical History: Negative for: Anorexia/bulimia; Confinement Anxiety Past Medical History Notes: anxiety and depression HBO Extended History Items Eyes: Glaucoma Immunizations Pneumococcal Vaccine: Received Pneumococcal Vaccination: Yes Implantable Devices Yes Family and Social History Unknown History: Yes; Never smoker; Marital Status - Married; Alcohol Use: Never; Drug Use: No History; Caffeine Use: Rarely; Financial Concerns: No; Food, Clothing or Shelter Needs: No; Support System Lacking: No; Transportation Concerns: No Electronic Signature(s) Signed: 06/13/2021 12:17:37 PM By: Geralyn Corwin DO Signed: 06/13/2021 6:17:32 PM By: Zenaida Deed RN, BSN Entered By: Geralyn Corwin on 06/13/2021 12:15:03 -------------------------------------------------------------------------------- SuperBill Details Patient Name: Date of Service: Denny Peon S. 06/13/2021 Medical Record Number: [id number] Patient Account Number: [id number] Date of Birth/Sex: Treating RN: Feb 21, 1933 (87 y.o. 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 I48.0 Paroxysmal atrial fibrillation Z79.01 Long term (current) use of anticoagulants Z95.0 Presence of cardiac pacemaker M79.606 Pain in leg, unspecified Facility Procedures CPT4 Code: [id number] Description: 99213 - WOUND CARE VISIT-LEV 3 EST PT Modifier: Quantity: 1 Physician Procedures : CPT4 Code Description Modifier [id number]  [id number] - WC PHYS LEVEL 3 - EST PT 1 ICD-10 Diagnosis Description 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

## 2021-06-18 ENCOUNTER — Other Ambulatory Visit: Payer: Self-pay

## 2021-06-18 DIAGNOSIS — L98499 Non-pressure chronic ulcer of skin of other sites with unspecified severity: Secondary | ICD-10-CM

## 2021-06-18 DIAGNOSIS — K921 Melena: Secondary | ICD-10-CM | POA: Insufficient documentation

## 2021-06-18 DIAGNOSIS — D649 Anemia, unspecified: Secondary | ICD-10-CM | POA: Insufficient documentation

## 2021-06-19 NOTE — Progress Notes (Signed)
Mammogram is negative for breast cancer. Repeat screening in 1 year.

## 2021-06-20 NOTE — Progress Notes (Signed)
Remote pacemaker transmission.   

## 2021-06-25 ENCOUNTER — Ambulatory Visit (INDEPENDENT_AMBULATORY_CARE_PROVIDER_SITE_OTHER): Payer: Medicare Other | Admitting: Vascular Surgery

## 2021-06-25 ENCOUNTER — Encounter: Payer: Self-pay | Admitting: Vascular Surgery

## 2021-06-25 ENCOUNTER — Ambulatory Visit (HOSPITAL_COMMUNITY)
Admission: RE | Admit: 2021-06-25 | Discharge: 2021-06-25 | Disposition: A | Discharge status: Home / Self Care | Payer: Medicare Other | Source: Ambulatory Visit | Attending: Vascular Surgery | Admitting: Vascular Surgery

## 2021-06-25 ENCOUNTER — Other Ambulatory Visit: Payer: Self-pay

## 2021-06-25 VITALS — BP 112/74 | HR 70 | Temp 97.8°F | Resp 16 | Ht 62.0 in | Wt 158.0 lb

## 2021-06-25 DIAGNOSIS — L98499 Non-pressure chronic ulcer of skin of other sites with unspecified severity: Secondary | ICD-10-CM | POA: Insufficient documentation

## 2021-06-25 DIAGNOSIS — I739 Peripheral vascular disease, unspecified: Secondary | ICD-10-CM | POA: Diagnosis not present

## 2021-06-25 NOTE — Progress Notes (Signed)
ASSESSMENT & PLAN   PERIPHERAL VASCULAR DISEASE: Based on her noninvasive studies I do not think she has any evidence of significant underlying peripheral vascular disease which might compromise healing.  She has triphasic Doppler signals in the right foot with an ABI of 100%.  Toe pressure is 54 mmHg which would suggest adequate circulation for healing.  I think she may have some underlying chronic venous insufficiency and so we have discussed the importance of leg elevation which may help with wound healing.  She will continue to follow with the wound care center.  I would only consider arteriography if the wound worsened or simply fail to heal.  Given her age I am trying to steer away from an invasive work-up unless absolutely necessary.  The daughter is in agreement with this.  I will be happy to see her back at any time if the wound worsens.  REASON FOR CONSULT:    Nonhealing wound right ankle.  The consult is requested by Dr. Geralyn Corwin.   HPI:  [name] Baird is a 87 y.o. female who developed a small wound on her lateral right malleolus in the end of February.  The family thinks this was possibly from some pressure on this area.  She is ambulatory with a walker.  She denies any claudication.  She denies rest pain or previous nonhealing ulcers.  She has had some darkish discoloration of her lower legs which she has attributed to being on blood thinners.  She was seen at the wound care center and vascular surgery consultation was recommended.  She really does not have any risk factors for peripheral vascular disease.  She denies any history of diabetes, hypertension, hypercholesterolemia, family history of premature cardiovascular disease, or tobacco use.  She is on Xarelto for atrial fibrillation.  She is on a statin.  Past Medical History:  Diagnosis Date   Acute GI bleeding 03/06/2020   Anxiety    Atrial flutter (HCC)    s/p ablation 06/29/2013 by Dr Johney Frame   CHF  (congestive heart failure) (HCC)    Complete heart block (HCC) 2007   s/p medtronic pacemaker implantation   Depression    Glaucoma    Hypertension    Pulmonary stenosis sp valvotomy        Shortness of breath    Status post patch closure of ASD     Family History  Problem Relation Age of Onset   Heart failure Mother    Stroke Father    Multiple sclerosis Daughter    Breast cancer Neg Hx     SOCIAL HISTORY: Social History   Tobacco Use   Smoking status: Never   Smokeless tobacco: Never  Substance Use Topics   Alcohol use: No    Allergies  Allergen Reactions   Sulfa Antibiotics Other (See Comments)    welps    Hydrocodone Nausea Only    nausea    Current Outpatient Medications  Medication Sig Dispense Refill   acetaminophen (TYLENOL) 650 MG CR tablet Take 1,300 mg by mouth daily as needed for pain.      bacitracin ophthalmic ointment      calcium-vitamin D (OSCAL) 250-125 MG-UNIT per tablet Take 1 tablet by mouth daily.     candesartan (ATACAND) 8 MG tablet TAKE 1 TABLET BY MOUTH EVERY DAY 90 tablet 3   clonazePAM (KLONOPIN) 0.5 MG tablet Take 0.5 tablets (0.25 mg total) by mouth at bedtime. 30 tablet 1   dorzolamide-timolol (COSOPT) 22.3-6.8 MG/ML ophthalmic  solution Place 2 drops into both eyes 2 (two) times daily.     furosemide (LASIX) 40 MG tablet Take 1 tablet (40 mg total) by mouth 2 (two) times daily. 60 tablet 11   latanoprost (XALATAN) 0.005 % ophthalmic solution Place 1 drop into both eyes at bedtime.     Multiple Vitamins-Minerals (MULTIVITAMIN PO) Take 1 tablet by mouth daily.      nebivolol (BYSTOLIC) 10 MG tablet Take 1 tablet (10 mg total) by mouth daily. 90 tablet 3   omeprazole (PRILOSEC) 20 MG capsule Take 1 capsule (20 mg total) by mouth daily. 90 capsule 1   potassium chloride (KLOR-CON) 10 MEQ tablet Take 2 tablets (20 mEq total) by mouth 2 (two) times daily. 120 tablet 11   RHOPRESSA 0.02 % SOLN Place 1 drop into the right eye every other  day.     rosuvastatin (CRESTOR) 10 MG tablet Take 1 tablet (10 mg total) by mouth at bedtime. 30 tablet 3   traMADol (ULTRAM) 50 MG tablet Take 1 tablet (50 mg total) by mouth every 12 (twelve) hours as needed. 10 tablet 0   UNABLE TO FIND Med Name: Heel protector boot for right ankle Apply at bedtime to promote healing of your pressure ulcer. 1 each 0   venlafaxine XR (EFFEXOR-XR) 75 MG 24 hr capsule TAKE 1 CAPSULE BY MOUTH EVERY DAY 90 capsule 2   XARELTO 15 MG TABS tablet TAKE 1 TABLET (15 MG TOTAL) BY MOUTH DAILY WITH SUPPER. 90 tablet 1   No current facility-administered medications for this visit.    REVIEW OF SYSTEMS:  [X]  denotes positive finding, [ ]  denotes negative finding Cardiac  Comments:  Chest pain or chest pressure:    Shortness of breath upon exertion: x   Short of breath when lying flat:    Irregular heart rhythm:        Vascular    Pain in calf, thigh, or hip brought on by ambulation:    Pain in feet at night that wakes you up from your sleep:     Blood clot in your veins:    Leg swelling:         Pulmonary    Oxygen at home:    Productive cough:     Wheezing:         Neurologic    Sudden weakness in arms or legs:     Sudden numbness in arms or legs:     Sudden onset of difficulty speaking or slurred speech:    Temporary loss of vision in one eye:     Problems with dizziness:         Gastrointestinal    Blood in stool:     Vomited blood:         Genitourinary    Burning when urinating:     Blood in urine:        Psychiatric    Major depression:         Hematologic    Bleeding problems:    Problems with blood clotting too easily:        Skin    Rashes or ulcers:        Constitutional    Fever or chills:    -  PHYSICAL EXAM:   Vitals:   06/25/21 1424  BP: 112/74  Pulse: 70  Resp: 16  Temp: 97.8 F (36.6 C)  SpO2: 96%  Weight: 158 lb (71.7 kg)  Height: 5\' 2"  (1.575 m)  Body mass index is 28.9 kg/m.  GENERAL: The patient is a  well-nourished female, in no acute distress. The vital signs are documented above. CARDIAC: There is a regular rate and rhythm.  VASCULAR: I do not detect carotid bruits. She has palpable femoral, popliteal, posterior tibial pulses bilaterally. She has multiphasic signals in the dorsalis pedis and posterior tibial positions bilaterally. She does have some hyperpigmentation consistent with chronic venous insufficiency. She has no significant lower extremity swelling. PULMONARY: There is good air exchange bilaterally without wheezing or rales. ABDOMEN: Soft and non-tender with normal pitched bowel sounds.  MUSCULOSKELETAL: There are no major deformities. NEUROLOGIC: No focal weakness or paresthesias are detected. SKIN: She has a small wound on her right lateral malleolus.     PSYCHIATRIC: The patient has a normal affect.  DATA:    ARTERIAL DOPPLER STUDY: I have independently interpreted the patient's arterial Doppler study.  On the right side there is a triphasic dorsalis pedis and posterior tibial signal.  ABI is 100%.  Toe pressure is 54 mmHg.  On the left side, there is a triphasic dorsalis pedis and posterior tibial signal.  ABI is 97%.  Toe pressure 79 mmHg.  Waverly Ferrari [facility]

## 2021-06-27 ENCOUNTER — Encounter (HOSPITAL_BASED_OUTPATIENT_CLINIC_OR_DEPARTMENT_OTHER): Payer: Medicare Other | Attending: Internal Medicine | Admitting: Internal Medicine

## 2021-06-27 ENCOUNTER — Ambulatory Visit
Admission: RE | Admit: 2021-06-27 | Discharge: 2021-06-27 | Disposition: A | Discharge status: Home / Self Care | Payer: Medicare Other | Source: Ambulatory Visit | Attending: Internal Medicine | Admitting: Internal Medicine

## 2021-06-27 ENCOUNTER — Other Ambulatory Visit: Payer: Self-pay

## 2021-06-27 ENCOUNTER — Other Ambulatory Visit: Payer: Self-pay | Admitting: Internal Medicine

## 2021-06-27 DIAGNOSIS — L97319 Non-pressure chronic ulcer of right ankle with unspecified severity: Secondary | ICD-10-CM | POA: Insufficient documentation

## 2021-06-27 DIAGNOSIS — I509 Heart failure, unspecified: Secondary | ICD-10-CM | POA: Diagnosis not present

## 2021-06-27 DIAGNOSIS — Z950 Presence of cardiac pacemaker: Secondary | ICD-10-CM | POA: Insufficient documentation

## 2021-06-27 DIAGNOSIS — I480 Paroxysmal atrial fibrillation: Secondary | ICD-10-CM | POA: Diagnosis not present

## 2021-06-27 DIAGNOSIS — I872 Venous insufficiency (chronic) (peripheral): Secondary | ICD-10-CM | POA: Diagnosis not present

## 2021-06-27 DIAGNOSIS — Z7901 Long term (current) use of anticoagulants: Secondary | ICD-10-CM | POA: Diagnosis not present

## 2021-06-27 DIAGNOSIS — S91001A Unspecified open wound, right ankle, initial encounter: Secondary | ICD-10-CM

## 2021-06-27 DIAGNOSIS — I110 Hypertensive heart disease with heart failure: Secondary | ICD-10-CM | POA: Diagnosis not present

## 2021-07-01 NOTE — Progress Notes (Signed)
[name] Baird, SCHREFFLER ([id number]) Visit Report for 06/27/2021 Chief Complaint Document Details Patient Name: Date of Service: [place], [name] 06/27/2021 8:30 A M Medical Record Number: [id number] Patient Account Number: [id number] Date of Birth/Sex: Treating RN: 06/27/1933 (87 y.o. [name] Primary Care Provider: Bjorn Pippin Other Clinician: Referring Provider: Treating Provider/Extender: Kathyrn Drown in Treatment: 4 Information Obtained from: Patient Chief Complaint Right ankle wound Electronic Signature(s) Signed: 06/27/2021 1:12:57 PM By: Geralyn Corwin DO Entered By: Geralyn Corwin on 06/27/2021 13:06:57 -------------------------------------------------------------------------------- Debridement Details Patient Name: Date of Service: [name] Baird NA S. 06/27/2021 8:30 A M Medical Record Number: [id number] Patient Account Number: [id number] Date of Birth/Sex: Treating RN: March 06, 1933 (87 y.o. [name] Baird Primary Care Provider: Bjorn Pippin Other Clinician: Referring Provider: Treating Provider/Extender: Kathyrn Drown in Treatment: 4 Debridement Performed for Assessment: Wound #1 Right,Lateral Malleolus Performed By: Physician Geralyn Corwin, DO Debridement Type: Debridement Level of Consciousness (Pre-procedure): Awake and Alert Pre-procedure Verification/Time Out Yes - 09:02 Taken: Start Time: 09:02 T Area Debrided (L x W): otal 0.3 (cm) x 0.3 (cm) = 0.09 (cm) Tissue and other material debrided: Viable, Non-Viable, Slough, Biofilm, Slough Level: Non-Viable Tissue Debridement Description: Selective/Open Wound Instrument: Curette Bleeding: Minimum End Time: 09:03 Procedural Pain: 0 Post Procedural Pain: 0 Response to Treatment: Procedure was tolerated well Level of Consciousness (Post- Awake and Alert procedure): Post Debridement Measurements of Total Wound Length: (cm) 0.3 Stage: Unstageable/Unclassified Width:  (cm) 0.3 Depth: (cm) 0.1 Volume: (cm) 0.007 Character of Wound/Ulcer Post Debridement: Requires Further Debridement Post Procedure Diagnosis Same as Pre-procedure Electronic Signature(s) Signed: 06/27/2021 1:12:57 PM By: Geralyn Corwin DO Signed: 07/01/2021 5:43:47 PM By: Zandra Abts RN, BSN Entered By: Zandra Abts on 06/27/2021 09:08:40 -------------------------------------------------------------------------------- HPI Details Patient Name: Date of Service: [name] Baird, [name] 06/27/2021 8:30 A M Medical Record Number: [id number] Patient Account Number: [id number] Date of Birth/Sex: Treating RN: 1933/05/01 (87 y.o. [name] Primary Care Provider: Bjorn Pippin Other Clinician: Referring Provider: Treating Provider/Extender: Kathyrn Drown in Treatment: 4 History of Present Illness HPI Description: Admission 6/10 [name] Baird is an 87 year old female with a past medical history of paroxysmal A. fib on anticoagulation, congenital heart disease (atrial septal defect status post patch closure), pulmonic stenosis (status post valvotomy 1985), left pulmonary artery aneurysm, complete heart block status post dual-chamber permanent pacemaker implantation that presents to the clinic for a right lateral malleolus wound that developed 3 months ago. She is not quite sure how it started but thinks she may have hit it against her walker. She has been placing Neosporin on this wound daily. She reports pain to the area worse at night and often finds herself laying her leg over the side of the bed. She has been given doxycycline for this issue by her podiatrist. She currently denies signs of infection. She reports trying to offload the area the best she can daily. 6/24; patient presents for 2-week follow-up. She has been using collagen and activating it with K-Y jelly. She has tolerated this well. She continues to have nocturnal pain at the wound site. She is scheduled  to see vein and vascular on 7/6. She denies signs of infection. 7/8; patient presents for 2-week follow-up. She has been using collagen to the wound. She has no issues or complaints today. She did see Dr. Edilia Bo with vein and vascular and was told she had adequate blood flow for wound healing. She denies signs of infection. Electronic Signature(s) Signed: 06/27/2021  1:12:57 PM By: Geralyn Corwin DO Entered By: Geralyn Corwin on 06/27/2021 13:07:57 -------------------------------------------------------------------------------- Physical Exam Details Patient Name: Date of Service: [name], [place] S. 06/27/2021 8:30 A M Medical Record Number: [id number] Patient Account Number: [id number] Date of Birth/Sex: Treating RN: Nov 09, 1933 (87 y.o. [name] Primary Care Provider: Bjorn Pippin Other Clinician: Referring Provider: Treating Provider/Extender: Kathyrn Drown in Treatment: 4 Psychiatric pleasant and cooperative. Notes Right ankle: Lateral malleolus with a small open wound that has dried nonviable tissue overlying it. sensitive to the touch. No increased warmth or erythema or drainage noted. No signs of infection. Decreased pedal pulses Electronic Signature(s) Signed: 06/27/2021 1:12:57 PM By: Geralyn Corwin DO Entered By: Geralyn Corwin on 06/27/2021 13:08:28 -------------------------------------------------------------------------------- Physician Orders Details Patient Name: Date of Service: [name] Baird, [name] 06/27/2021 8:30 A M Medical Record Number: [id number] Patient Account Number: [id number] Date of Birth/Sex: Treating RN: 1933-03-28 (87 y.o. 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 - with Dr. Mikey Bussing Return A Bathing/ Shower/ Hygiene May shower and wash wound with soap and water. Edema Control - Lymphedema / SCD / Other Elevate legs to the level of the heart or above for 30 minutes daily and/or when sitting, a frequency of: - throughout the day Avoid standing for long periods of time. Exercise regularly Additional Orders / Instructions Follow Nutritious Diet Wound Treatment Wound #1 - Malleolus Wound Laterality: Right, Lateral Cleanser: Soap and Water 1 x Per Day/30 Days Discharge Instructions: May shower and wash wound with dial antibacterial soap and water prior to dressing change. Cleanser: Wound Cleanser 1 x Per Day/30 Days Discharge Instructions: Cleanse the wound with wound cleanser prior to applying a clean dressing using gauze sponges, not tissue or cotton balls. Peri-Wound Care: Sween Lotion (Moisturizing lotion) 1 x Per Day/30 Days Discharge Instructions: Apply moisturizing lotion as directed Prim Dressing: Hydrofera Blue Classic Foam, 2x2 in 1 x Per Day/30 Days ary Discharge Instructions: Moisten with saline prior to applying to wound bed Prim Dressing: Santyl Ointment 1 x Per Day/30 Days ary Discharge Instructions: Apply nickel thick amount to wound bed, under Hydrofera Secondary Dressing: Woven Gauze Sponges 2x2 in 1 x Per Day/30 Days Discharge Instructions: Apply over primary dressing as directed. Compression Wrap: Kerlix Roll 4.5x3.1 (in/yd) 1 x Per Day/30 Days Discharge Instructions: Apply Kerlix and Coban compression as directed. Compression Wrap: Coban Self-Adherent Wrap 4x5 (in/yd) 1 x Per Day/30 Days Discharge Instructions: Apply over Kerlix as directed. Radiology X-ray, right ankle - Non healing wound on right ankle -  (ICD10 L97.319 - Non-pressure chronic ulcer of right ankle with unspecified severity) Electronic Signature(s) Signed: 06/27/2021 1:12:57 PM By: Geralyn Corwin DO Entered By: Geralyn Corwin on 06/27/2021 13:08:47 Prescription 06/27/2021 -------------------------------------------------------------------------------- Rudean Curt. Geralyn Corwin DO Patient Name: Provider: 1933/03/13 [id number] Date of Birth: NPI#: [id number] Sex: DEA #: [id number] Phone #: License #: Eligha Bridegroom [facility] Patient Address: [address] [place] [address] , [address] [place], [place] [address] [phone] Allergies Sulfa (Sulfonamide Antibiotics); hydrocodone Provider's Orders X-ray, right ankle - ICD10: L97.319 - Non  healing wound on right ankle Hand Signature: Date(s): Electronic Signature(s) Signed: 06/27/2021 1:12:57 PM By: Geralyn Corwin DO Entered By: Geralyn Corwin on 06/27/2021 13:08:47 -------------------------------------------------------------------------------- Problem List Details Patient Name: Date of Service: [name] Baird, [name] 06/27/2021 8:30 A M Medical Record Number: [id number] Patient Account Number: [id number] Date of Birth/Sex: Treating RN: Mar 25, 1933 (87 y.o. [name] Primary Care Provider: Bjorn Pippin Other Clinician: Referring Provider: Treating Provider/Extender: Kathyrn Drown in Treatment: 4 Active Problems ICD-10 Encounter Code Description Active Date MDM Diagnosis 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 05/30/2021 No Yes I48.0 Paroxysmal atrial fibrillation 05/30/2021 No Yes Z79.01 Long term (current) use of anticoagulants 05/30/2021 No Yes Z95.0 Presence of cardiac pacemaker 05/30/2021 No Yes M79.606 Pain in leg, unspecified 05/30/2021 No Yes Inactive Problems Resolved Problems Electronic Signature(s) Signed:  06/27/2021 1:12:57 PM By: Geralyn Corwin DO Entered By: Geralyn Corwin on 06/27/2021 13:06:19 -------------------------------------------------------------------------------- Progress Note Details Patient Name: Date of Service: [name] B[place], [name] 06/27/2021 8:30 A M Medical Record Number: [id number] Patient Account Number: [id number] Date of Birth/Sex: Treating RN: [birth year] (87 y.o. [name] Primary Care Provider: Bjorn Pippin Other Clinician: Referring Provider: Treating Provider/Extender: Kathyrn Drown in Treatment: 4 Subjective Chief Complaint Information obtained from Patient Right ankle wound History of Present Illness (HPI) Admission 6/10 [name] is an 87 year old female with a past medical history of paroxysmal A. fib on anticoagulation, congenital heart disease (atrial septal defect status post patch closure), pulmonic stenosis (status post valvotomy 1985), left pulmonary artery aneurysm, complete heart block status post dual-chamber permanent pacemaker implantation that presents to the clinic for a right lateral malleolus wound that developed 3 months ago. She is not quite sure how it started but thinks she may have hit it against her walker. She has been placing Neosporin on this wound daily. She reports pain to the area worse at night and often finds herself laying her leg over the side of the bed. She has been given doxycycline for this issue by her podiatrist. She currently denies signs of infection. She reports trying to offload the area the best she can daily. 6/24; patient presents for 2-week follow-up. She has been using collagen and activating it with K-Y jelly. She has tolerated this well. She continues to have nocturnal pain at the wound site. She is scheduled to see vein and vascular on 7/6. She denies signs of infection. 7/8; patient presents for 2-week follow-up. She has been using collagen to the wound. She has no issues or  complaints today. She did see Dr. Edilia Bo with vein and vascular and was told she had adequate blood flow for wound healing. She denies signs of infection. Patient History Information obtained from Patient. Family History Unknown History. Social History Never smoker, Marital Status - Married, Alcohol Use - Never, Drug Use - No History, Caffeine Use - Rarely. Medical History Eyes Patient has history of Glaucoma Denies history of Cataracts, Optic Neuritis Ear/Nose/Mouth/Throat Denies history of Chronic sinus problems/congestion, Middle ear problems Hematologic/Lymphatic Denies history of Anemia, Hemophilia, Human Immunodeficiency Virus, Lymphedema, Sickle Cell Disease Respiratory Denies history of Aspiration, Asthma, Chronic Obstructive Pulmonary Disease (COPD), Pneumothorax, Sleep Apnea, Tuberculosis Cardiovascular Patient has history of Arrhythmia - atrial flutter, Congestive Heart Failure, Hypertension Denies history of Angina, Coronary Artery Disease, Deep Vein Thrombosis, Hypotension, Myocardial Infarction, Peripheral Arterial Disease, Peripheral Venous Disease, Phlebitis, Vasculitis Gastrointestinal Denies history of Cirrhosis , Colitis, Crohnoos, Hepatitis A, Hepatitis B, Hepatitis C Endocrine Denies history of  Type I Diabetes, Type II Diabetes Genitourinary Denies history of End Stage Renal Disease Immunological Denies history of Lupus Erythematosus, Raynaudoos, Scleroderma Integumentary (Skin) Denies history of History of Burn Musculoskeletal Denies history of Gout, Rheumatoid Arthritis, Osteoarthritis, Osteomyelitis Neurologic Denies history of Dementia, Neuropathy, Quadriplegia, Paraplegia, Seizure Disorder Oncologic Denies history of Received Chemotherapy, Received Radiation Psychiatric Denies history of Anorexia/bulimia, Confinement Anxiety Medical A Surgical History Notes nd Cardiovascular complete heart block s/p medtronic pacemaker status post patch cllosure  of ASD Psychiatric anxiety and depression Objective Constitutional Vitals Time Taken: 8:34 AM, Temperature: 98.2 F, Pulse: 78 bpm, Respiratory Rate: 18 breaths/min, Blood Pressure: 121/83 mmHg. Psychiatric pleasant and cooperative. General Notes: Right ankle: Lateral malleolus with a small open wound that has dried nonviable tissue overlying it. sensitive to the touch. No increased warmth or erythema or drainage noted. No signs of infection. Decreased pedal pulses Integumentary (Hair, Skin) Wound #1 status is Open. Original cause of wound was Trauma. The date acquired was: 01/21/2021. The wound has been in treatment 4 weeks. The wound is located on the Right,Lateral Malleolus. The wound measures 0.3cm length x 0.3cm width x 0.1cm depth; 0.071cm^2 area and 0.007cm^3 volume. There is Fat Layer (Subcutaneous Tissue) exposed. There is no tunneling or undermining noted. There is a medium amount of serosanguineous drainage noted. The wound margin is distinct with the outline attached to the wound base. There is small (1-33%) pink, pale granulation within the wound bed. There is a large (67-100%) amount of necrotic tissue within the wound bed including Adherent Slough. Assessment Active Problems ICD-10 Non-pressure chronic ulcer of right ankle with unspecified severity Paroxysmal atrial fibrillation Long term (current) use of anticoagulants Presence of cardiac pacemaker Pain in leg, unspecified Patient saw Dr. Edilia Bo with vein and vascular who noted that the patient had an ABI on the right foot of 100% and toe pressure of 54. This should be adequate for wound healing. At this time I debrided the wound but still do not have a good viable surface. I will change the dressing to Santyl and Hydrofera Blue. She has some evidence of venous insufficiency and I would like to try Kerlix/Coban as well. I will see her back in a week Procedures Wound #1 Pre-procedure diagnosis of Wound #1 is a Pressure  Ulcer located on the Right,Lateral Malleolus . There was a Selective/Open Wound Non-Viable Tissue Debridement with a total area of 0.09 sq cm performed by Geralyn Corwin, DO. With the following instrument(s): Curette to remove Viable and Non-Viable tissue/material. Material removed includes Slough and Biofilm and. No specimens were taken. A time out was conducted at 09:02, prior to the start of the procedure. A Minimum amount of bleeding was controlled with N/A. The procedure was tolerated well with a pain level of 0 throughout and a pain level of 0 following the procedure. Post Debridement Measurements: 0.3cm length x 0.3cm width x 0.1cm depth; 0.007cm^3 volume. Post debridement Stage noted as Unstageable/Unclassified. Character of Wound/Ulcer Post Debridement requires further debridement. Post procedure Diagnosis Wound #1: Same as Pre-Procedure Plan Follow-up Appointments: Return Appointment in 1 week. - with Dr. Mikey Bussing Bathing/ Shower/ Hygiene: May shower and wash wound with soap and water. Edema Control - Lymphedema / SCD / Other: Elevate legs to the level of the heart or above for 30 minutes daily and/or when sitting, a frequency of: - throughout the day Avoid standing for long periods of time. Exercise regularly Additional Orders / Instructions: Follow Nutritious Diet Radiology ordered were: X-ray, right ankle - Non healing  wound on right ankle WOUND #1: - Malleolus Wound Laterality: Right, Lateral Cleanser: Soap and Water 1 x Per Day/30 Days Discharge Instructions: May shower and wash wound with dial antibacterial soap and water prior to dressing change. Cleanser: Wound Cleanser 1 x Per Day/30 Days Discharge Instructions: Cleanse the wound with wound cleanser prior to applying a clean dressing using gauze sponges, not tissue or cotton balls. Peri-Wound Care: Sween Lotion (Moisturizing lotion) 1 x Per Day/30 Days Discharge Instructions: Apply moisturizing lotion as directed Prim  Dressing: Hydrofera Blue Classic Foam, 2x2 in 1 x Per Day/30 Days ary Discharge Instructions: Moisten with saline prior to applying to wound bed Prim Dressing: Santyl Ointment 1 x Per Day/30 Days ary Discharge Instructions: Apply nickel thick amount to wound bed, under Hydrofera Secondary Dressing: Woven Gauze Sponges 2x2 in 1 x Per Day/30 Days Discharge Instructions: Apply over primary dressing as directed. Com pression Wrap: Kerlix Roll 4.5x3.1 (in/yd) 1 x Per Day/30 Days Discharge Instructions: Apply Kerlix and Coban compression as directed. Com pression Wrap: Coban Self-Adherent Wrap 4x5 (in/yd) 1 x Per Day/30 Days Discharge Instructions: Apply over Kerlix as directed. 1. In office sharp debridement 2. Hydrofera Blue and Santyl under Kerlix/Coban 3. Follow-up in 1 week Electronic Signature(s) Signed: 06/27/2021 1:12:57 PM By: Geralyn Corwin DO Entered By: Geralyn Corwin on 06/27/2021 13:11:58 -------------------------------------------------------------------------------- HxROS Details Patient Name: Date of Service: [name] Baird, [name] 06/27/2021 8:30 A M Medical Record Number: [id number] Patient Account Number: [id number] Date of Birth/Sex: Treating RN: 04-Nov-1933 (87 y.o. [name] Primary Care Provider: Bjorn Pippin Other Clinician: Referring Provider: Treating Provider/Extender: Kathyrn Drown in Treatment: 4 Information Obtained From Patient Eyes Medical History: Positive for: Glaucoma Negative for: Cataracts; Optic Neuritis Ear/Nose/Mouth/Throat Medical History: Negative for: Chronic sinus problems/congestion; Middle ear problems Hematologic/Lymphatic Medical History: Negative for: Anemia; Hemophilia; Human Immunodeficiency Virus; Lymphedema; Sickle Cell Disease Respiratory Medical History: Negative for: Aspiration; Asthma; Chronic Obstructive Pulmonary Disease (COPD); Pneumothorax; Sleep Apnea; Tuberculosis Cardiovascular Medical  History: Positive for: Arrhythmia - atrial flutter; Congestive Heart Failure; Hypertension Negative for: Angina; Coronary Artery Disease; Deep Vein Thrombosis; Hypotension; Myocardial Infarction; Peripheral Arterial Disease; Peripheral Venous Disease; Phlebitis; Vasculitis Past Medical History Notes: complete heart block s/p medtronic pacemaker status post patch cllosure of ASD Gastrointestinal Medical History: Negative for: Cirrhosis ; Colitis; Crohns; Hepatitis A; Hepatitis B; Hepatitis C Endocrine Medical History: Negative for: Type I Diabetes; Type II Diabetes Genitourinary Medical History: Negative for: End Stage Renal Disease Immunological Medical History: Negative for: Lupus Erythematosus; Raynauds; Scleroderma Integumentary (Skin) Medical History: Negative for: History of Burn Musculoskeletal Medical History: Negative for: Gout; Rheumatoid Arthritis; Osteoarthritis; Osteomyelitis Neurologic Medical History: Negative for: Dementia; Neuropathy; Quadriplegia; Paraplegia; Seizure Disorder Oncologic Medical History: Negative for: Received Chemotherapy; Received Radiation Psychiatric Medical History: Negative for: Anorexia/bulimia; Confinement Anxiety Past Medical History Notes: anxiety and depression HBO Extended History Items Eyes: Glaucoma Immunizations Pneumococcal Vaccine: Received Pneumococcal Vaccination: Yes Implantable Devices Yes Family and Social History Unknown History: Yes; Never smoker; Marital Status - Married; Alcohol Use: Never; Drug Use: No History; Caffeine Use: Rarely; Financial Concerns: No; Food, Clothing or Shelter Needs: No; Support System Lacking: No; Transportation Concerns: No Electronic Signature(s) Signed: 06/27/2021 1:12:57 PM By: Geralyn Corwin DO Signed: 07/01/2021 5:43:47 PM By: Zandra Abts RN, BSN Entered By: Geralyn Corwin on 06/27/2021  13:08:03 -------------------------------------------------------------------------------- SuperBill Details Patient Name: Date of Service: [name] Baird NA S. 06/27/2021 Medical Record Number: [id number] Patient Account Number: [id number] Date of Birth/Sex: Treating RN: July 16, 1933 (87 y.o. [name] Baird  Primary Care Provider: Bjorn PippinGray, Joseph Other Clinician: Referring Provider: Treating Provider/Extender: Kathyrn DrownHoffman, Kodi Steil Gray, Joseph Weeks in Treatment: 4 Diagnosis Coding ICD-10 Codes Code Description 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 I48.0 Paroxysmal atrial fibrillation Z79.01 Long term (current) use of anticoagulants Z95.0 Presence of cardiac pacemaker M79.606 Pain in leg, unspecified Facility Procedures CPT4 Code: [id number] Description: 97597 - DEBRIDE WOUND 1ST 20 SQ CM OR < ICD-10 Diagnosis Description 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 Modifier: Quantity: 1 Physician Procedures : CPT4 Code Description Modifier [id number] - WC PHYS DEBR WO ANESTH 20 SQ CM ICD-10 Diagnosis Description 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 Quantity: 1 Electronic Signature(s) Signed: 06/27/2021 1:12:57 PM By: Geralyn CorwinHoffman, Louana Fontenot DO Entered By: Geralyn CorwinHoffman, Yaxiel Minnie on 06/27/2021 13:12:34

## 2021-07-01 NOTE — Progress Notes (Signed)
[name] Baird, [name] Baird ([id number]) Visit Report for 06/27/2021 Arrival Information Details Patient Name: Date of Service: [place], [name] 06/27/2021 8:30 A M Medical Record Number: [id number] Patient Account Number: [id number] Date of Birth/Sex: Treating RN: 10-Aug-1933 (87 y.o. [name] Baird Primary Care [name] Baird: [name] Baird Other Clinician: Referring [name] Baird: Treating [name] Baird/Extender: [name] Baird: 4 Visit Information History Since Last Visit All ordered tests and consults were completed: Yes Patient Arrived: Wheel Chair Added or deleted any medications: No Arrival Time: 08:29 Any new allergies or adverse reactions: No Accompanied By: daughter Had a fall or experienced change in No Transfer Assistance: Manual activities of daily living that may affect Patient Identification Verified: Yes risk of falls: Secondary Verification Process Completed: Yes Signs or symptoms of abuse/neglect since last visito No Patient Requires Transmission-Based Precautions: No Hospitalized since last visit: No Patient Has Alerts: No Implantable device outside of the clinic excluding No cellular tissue based products placed in the center since last visit: Has Dressing in Place as Prescribed: Yes Pain Present Now: No Electronic Signature(s) Signed: 06/27/2021 5:38:08 PM By: [name] Mu RN Entered By: [name] Baird on 06/27/2021 14:25:53 -------------------------------------------------------------------------------- Encounter Discharge Information Details Patient Name: Date of Service: [name] Baird, [name] 06/27/2021 8:30 A M Medical Record Number: [id number] Patient Account Number: [id number] Date of Birth/Sex: Treating RN: 1933-02-16 (87 y.o. [name] Baird, [name] Baird Primary Care Ziyon Soltau: [name] Baird Other Clinician: Referring Graviela Nodal: Treating Ajani Rineer/Extender: [name] Baird: 4 Encounter Discharge Information Items Post  Procedure Vitals Discharge Condition: Stable Temperature (F): 97.4 Ambulatory Status: Wheelchair Pulse (bpm): 74 Discharge Destination: Home Respiratory Rate (breaths/min): 17 Transportation: Private Auto Blood Pressure (mmHg): 134/74 Accompanied By: family Schedule Follow-up Appointment: Yes Clinical Summary of Care: Patient Declined Electronic Signature(s) Signed: 06/27/2021 5:38:08 PM By: [name] Mu RN Entered By: [name] Baird on 06/27/2021 09:40:29 -------------------------------------------------------------------------------- Lower Extremity Assessment Details Patient Name: Date of Service: [name] Baird, [name] 06/27/2021 8:30 A M Medical Record Number: [id number] Patient Account Number: [id number] Date of Birth/Sex: Treating RN: [birth year] (87 y.o. [name] Baird Primary Care Pernie Grosso: [name] Baird Other Clinician: Referring Smriti Barkow: Treating Daielle Melcher/Extender: [name] Baird: 4 Edema Assessment Assessed: [name] Baird: No] [name] Baird: Yes] E[Left: dema] [Right: :] Calf Left: Right: Point of Measurement: From Medial Instep 29.8 cm Ankle Left: Right: Point of Measurement: From Medial Instep 18 cm Vascular Assessment Pulses: Dorsalis Pedis Palpable: [Right:Yes] Electronic Signature(s) Signed: 06/27/2021 5:49:30 PM By: [name] Baird Entered By: [name] Baird on 06/27/2021 08:36:04 -------------------------------------------------------------------------------- Multi Wound Chart Details Patient Name: Date of Service: [name] Baird, [name] 06/27/2021 8:30 A M Medical Record Number: [id number] Patient Account Number: [id number] Date of Birth/Sex: Treating RN: [birth year] (87 y.o. [name] Baird Primary Care Georgie Eduardo: [name] Baird Other Clinician: Referring Dajon Rowe: Treating Addis Tuohy/Extender: [name] Baird: 4 Vital Signs Height(in): Pulse(bpm): 78 Weight(lbs): Blood Pressure(mmHg): 121/83 Body  Mass Index(BMI): Temperature(F): 98.2 Respiratory Rate(breaths/min): 18 Photos: [1:No Photos Right, Lateral Malleolus] [N/A:N/A N/A] Wound Location: [1:Trauma] [N/A:N/A] Wounding Event: [1:Pressure Ulcer] [N/A:N/A] Primary Etiology: [1:Glaucoma, Arrhythmia, Congestive] [N/A:N/A] Comorbid History: [1:Heart Failure, Hypertension 01/21/2021] [N/A:N/A] Date Acquired: [1:4] [N/A:N/A] Weeks of Baird: [1:Open] [N/A:N/A] Wound Status: [1:0.3x0.3x0.1] [N/A:N/A] Measurements L x W x D (cm) [1:0.071] [N/A:N/A] A (cm) : rea [1:0.007] [N/A:N/A] Volume (cm) : [1:-51.10%] [N/A:N/A] % Reduction in A rea: [1:22.20%] [N/A:N/A] % Reduction in Volume: [1:Unstageable/Unclassified] [N/A:N/A] Classification: [1:Medium] [N/A:N/A] Exudate A mount: [1:Serosanguineous] [N/A:N/A] Exudate Type: [1:red, brown] [N/A:N/A] Exudate Color: [1:Distinct, outline attached] [N/A:N/A]  Wound Margin: [1:Small (1-33%)] [N/A:N/A] Granulation A mount: [1:Pink, Pale] [N/A:N/A] Granulation Quality: [1:Large (67-100%)] [N/A:N/A] Necrotic A mount: [1:Fat Layer (Subcutaneous Tissue): Yes N/A] Exposed Structures: [1:Fascia: No Tendon: No Muscle: No Joint: No Bone: No Small (1-33%)] [N/A:N/A] Epithelialization: [1:Debridement - Selective/Open Wound N/A] Debridement: Pre-procedure Verification/Time Out 09:02 [N/A:N/A] Taken: [1:Slough] [N/A:N/A] Tissue Debrided: [1:Non-Viable Tissue] [N/A:N/A] Level: [1:0.09] [N/A:N/A] Debridement A (sq cm): [1:rea Curette] [N/A:N/A] Instrument: [1:Minimum] [N/A:N/A] Bleeding: [1:0] [N/A:N/A] Procedural Pain: [1:0] [N/A:N/A] Post Procedural Pain: [1:Procedure was tolerated well] [N/A:N/A] Debridement Baird Response: [1:0.3x0.3x0.1] [N/A:N/A] Post Debridement Measurements L x W x D (cm) [1:0.007] [N/A:N/A] Post Debridement Volume: (cm) [1:Unstageable/Unclassified] [N/A:N/A] Post Debridement Stage: [1:Debridement] [N/A:N/A] Baird Notes Wound #1 (Malleolus) Wound Laterality:  Right, Lateral Cleanser Soap and Water Discharge Instruction: May shower and wash wound with dial antibacterial soap and water prior to dressing change. Wound Cleanser Discharge Instruction: Cleanse the wound with wound cleanser prior to applying a clean dressing using gauze sponges, not tissue or cotton balls. Peri-Wound Care Sween Lotion (Moisturizing lotion) Discharge Instruction: Apply moisturizing lotion as directed Topical Primary Dressing Hydrofera Blue Classic Foam, 2x2 in Discharge Instruction: Moisten with saline prior to applying to wound bed Santyl Ointment Discharge Instruction: Apply nickel thick amount to wound bed, under Hydrofera Secondary Dressing Woven Gauze Sponges 2x2 in Discharge Instruction: Apply over primary dressing as directed. Secured With Compression Wrap Kerlix Roll 4.5x3.1 (in/yd) Discharge Instruction: Apply Kerlix and Coban compression as directed. Coban Self-Adherent Wrap 4x5 (in/yd) Discharge Instruction: Apply over Kerlix as directed. Compression Stockings Add-Ons Electronic Signature(s) Signed: 06/27/2021 1:12:57 PM By: Geralyn Corwin DO Signed: 07/01/2021 5:43:47 PM By: Zandra Abts RN, BSN Entered By: Geralyn Corwin on 06/27/2021 13:06:35 -------------------------------------------------------------------------------- Multi-Disciplinary Care Plan Details Patient Name: Date of Service: [place], [place] S. 06/27/2021 8:30 A M Medical Record Number: [id number] Patient Account Number: [id number] Date of Birth/Sex: Treating RN: November 11, 1933 (87 y.o. [name] Baird Primary Care Gustavia Carie: [name] Baird Other Clinician: Referring Moroni Nester: Treating Davionna Blacksher/Extender: [name] Baird: 4 Active Inactive Wound/Skin Impairment Nursing Diagnoses: Impaired tissue integrity Goals: Patient/caregiver will verbalize understanding of skin care regimen Date Initiated: 05/30/2021 Target Resolution Date: 07/25/2021 Goal  Status: Active Ulcer/skin breakdown will have a volume reduction of 30% by week 4 Date Initiated: 05/30/2021 Date Inactivated: 06/27/2021 Target Resolution Date: 06/27/2021 Goal Status: Unmet Unmet Reason: non viable tissue Interventions: Assess patient/caregiver ability to obtain necessary supplies Assess patient/caregiver ability to perform ulcer/skin care regimen upon admission and as needed Assess ulceration(s) every visit Provide education on ulcer and skin care Baird Activities: Skin care regimen initiated : 05/30/2021 Topical wound management initiated : 05/30/2021 Notes: Electronic Signature(s) Signed: 07/01/2021 5:43:47 PM By: Zandra Abts RN, BSN Entered By: Zandra Abts on 06/27/2021 11:29:41 -------------------------------------------------------------------------------- Pain Assessment Details Patient Name: Date of Service: Ferdinand L[place] NA S. 06/27/2021 8:30 A M Medical Record Number: [id number] Patient Account Number: [id number] Date of Birth/Sex: Treating RN: [birth year] (87 y.o. [name] Baird Primary Care Yacoub Diltz: [name] Baird Other Clinician: Referring Corretta Munce: Treating Lourdes Manning/Extender: [name] Baird: 4 Active Problems Location of Pain Severity and Description of Pain Patient Has Paino No Site Locations Pain Management and Medication Current Pain Management: Electronic Signature(s) Signed: 06/27/2021 5:49:30 PM By: [name] Baird Entered By: [name] Baird on 06/27/2021 08:35:08 -------------------------------------------------------------------------------- Patient/Caregiver Education Details Patient Name: Date of Service: Leda Roys 7/8/2022andnbsp8:30 A M Medical Record Number: [id number] Patient Account Number: [id number] Date of Birth/Gender: Treating RN: 20-Apr-1933 (87 y.o. [name] Baird Primary Care  Physician: [name] Baird Other Clinician: Referring Physician: Treating Physician/Extender: [name] Baird: 4 Education Assessment Education Provided To: Patient Education Topics Provided Wound/Skin Impairment: Methods: Explain/Verbal Responses: State content correctly Electronic Signature(s) Signed: 07/01/2021 5:43:47 PM By: Zandra Abts RN, BSN Entered By: Zandra Abts on 06/27/2021 11:30:09 -------------------------------------------------------------------------------- Wound Assessment Details Patient Name: Date of Service: Ferdinand Lango NA S. 06/27/2021 8:30 A M Medical Record Number: [id number] Patient Account Number: [id number] Date of Birth/Sex: Treating RN: August 31, 1933 (87 y.o. [name] Baird Primary Care Delance Weide: [name] Baird Other Clinician: Referring Lewis Grivas: Treating Dasani Thurlow/Extender: [name] Baird: 4 Wound Status Wound Number: 1 Primary Etiology: Pressure Ulcer Wound Location: Right, Lateral Malleolus Wound Status: Open Wounding Event: Trauma Comorbid Glaucoma, Arrhythmia, Congestive Heart Failure, History: Hypertension Date Acquired: 01/21/2021 Weeks Of Baird: 4 Clustered Wound: No Photos Wound Measurements Length: (cm) 0.3 Width: (cm) 0.3 Depth: (cm) 0.1 Area: (cm) 0.071 Volume: (cm) 0.007 % Reduction in Area: -51.1% % Reduction in Volume: 22.2% Epithelialization: Small (1-33%) Tunneling: No Undermining: No Wound Description Classification: Unstageable/Unclassified Wound Margin: Distinct, outline attached Exudate Amount: Medium Exudate Type: Serosanguineous Exudate Color: red, brown Foul Odor After Cleansing: No Slough/Fibrino Yes Wound Bed Granulation Amount: Small (1-33%) Exposed Structure Granulation Quality: Pink, Pale Fascia Exposed: No Necrotic Amount: Large (67-100%) Fat Layer (Subcutaneous Tissue) Exposed: Yes Necrotic Quality: Adherent Slough Tendon Exposed: No Muscle Exposed: No Joint Exposed: No Bone Exposed: No Baird Notes Wound #1  (Malleolus) Wound Laterality: Right, Lateral Cleanser Soap and Water Discharge Instruction: May shower and wash wound with dial antibacterial soap and water prior to dressing change. Wound Cleanser Discharge Instruction: Cleanse the wound with wound cleanser prior to applying a clean dressing using gauze sponges, not tissue or cotton balls. Peri-Wound Care Sween Lotion (Moisturizing lotion) Discharge Instruction: Apply moisturizing lotion as directed Topical Primary Dressing Hydrofera Blue Classic Foam, 2x2 in Discharge Instruction: Moisten with saline prior to applying to wound bed Santyl Ointment Discharge Instruction: Apply nickel thick amount to wound bed, under Hydrofera Secondary Dressing Woven Gauze Sponges 2x2 in Discharge Instruction: Apply over primary dressing as directed. Secured With Compression Wrap Kerlix Roll 4.5x3.1 (in/yd) Discharge Instruction: Apply Kerlix and Coban compression as directed. Coban Self-Adherent Wrap 4x5 (in/yd) Discharge Instruction: Apply over Kerlix as directed. Compression Stockings Add-Ons Electronic Signature(s) Signed: 06/27/2021 5:16:50 PM By: Karl Ito Signed: 06/27/2021 5:49:30 PM By: [name] Baird Entered By: Karl Ito on 06/27/2021 16:56:45 -------------------------------------------------------------------------------- Vitals Details Patient Name: Date of Service: [name] Baird, [name] 06/27/2021 8:30 A M Medical Record Number: [id number] Patient Account Number: [id number] Date of Birth/Sex: Treating RN: [birth year] (87 y.o. [name] Baird Primary Care Sricharan Lacomb: [name] Baird Other Clinician: Referring Jachai Okazaki: Treating Annell Canty/Extender: [name] Baird: 4 Vital Signs Time Taken: 08:34 Temperature (F): 98.2 Pulse (bpm): 78 Respiratory Rate (breaths/min): 18 Blood Pressure (mmHg): 121/83 Reference Range: 80 - 120 mg / dl Electronic Signature(s) Signed: 06/27/2021 5:49:30 PM By:  [name] Baird Entered By: [name] Baird on 06/27/2021 08:34:57

## 2021-07-04 ENCOUNTER — Other Ambulatory Visit: Payer: Self-pay

## 2021-07-04 ENCOUNTER — Encounter (HOSPITAL_BASED_OUTPATIENT_CLINIC_OR_DEPARTMENT_OTHER): Payer: Medicare Other | Admitting: Internal Medicine

## 2021-07-04 DIAGNOSIS — L97319 Non-pressure chronic ulcer of right ankle with unspecified severity: Secondary | ICD-10-CM

## 2021-07-04 NOTE — Progress Notes (Signed)
[name], [name] ([id number]) Visit Report for 07/04/2021 Chief Complaint Document Details Patient Name: Date of Service: [name], [name] 07/04/2021 8:30 A M Medical Record Number: [id number] Patient Account Number: [id number] Date of Birth/Sex: Treating RN: Oct 07, 1933 (87 y.o. [name] Baird Primary Care Provider: Bjorn Pippin Other Clinician: Referring Provider: Treating Provider/Extender: Kathyrn Drown in Treatment: 5 Information Obtained from: Patient Chief Complaint Right ankle wound Electronic Signature(s) Signed: 07/04/2021 10:50:17 AM By: Geralyn Corwin DO Entered By: Geralyn Corwin on 07/04/2021 10:44:38 -------------------------------------------------------------------------------- Debridement Details Patient Name: Date of Service: [name] Baird, [name] 07/04/2021 8:30 A M Medical Record Number: [id number] Patient Account Number: [id number] Date of Birth/Sex: Treating RN: [birth year] (87 y.o. [place], [place] Primary Care Provider: Bjorn Pippin Other Clinician: Referring Provider: Treating Provider/Extender: Kathyrn Drown in Treatment: 5 Debridement Performed for Assessment: Wound #1 Right,Lateral Malleolus Performed By: Physician Geralyn Corwin, DO Debridement Type: Debridement Level of Consciousness (Pre-procedure): Awake and Alert Pre-procedure Verification/Time Out Yes - 09:30 Taken: Start Time: 09:31 Pain Control: Other : benzocaine 20% spray T Area Debrided (L x W): otal 0.3 (cm) x 0.2 (cm) = 0.06 (cm) Tissue and other material debrided: Non-Viable, Slough, Subcutaneous, Slough Level: Skin/Subcutaneous Tissue Debridement Description: Excisional Instrument: Curette Bleeding: Minimum Hemostasis Achieved: Pressure End Time: 09:34 Procedural Pain: 4 Post Procedural Pain: 2 Response to Treatment: Procedure was tolerated well Level of Consciousness (Post- Awake and Alert procedure): Post Debridement  Measurements of Total Wound Length: (cm) 0.3 Stage: Unstageable/Unclassified Width: (cm) 0.2 Depth: (cm) 0.1 Volume: (cm) 0.005 Character of Wound/Ulcer Post Debridement: Requires Further Debridement Post Procedure Diagnosis Same as Pre-procedure Electronic Signature(s) Signed: 07/04/2021 10:50:17 AM By: Geralyn Corwin DO Signed: 07/04/2021 12:39:56 PM By: Zenaida Deed RN, BSN Entered By: Zenaida Deed on 07/04/2021 09:35:33 -------------------------------------------------------------------------------- HPI Details Patient Name: Date of Service: [name], [name] 07/04/2021 8:30 A M Medical Record Number: [id number] Patient Account Number: [id number] Date of Birth/Sex: Treating RN: 1933/04/13 (87 y.o. [name] Baird Primary Care Provider: Bjorn Pippin Other Clinician: Referring Provider: Treating Provider/Extender: Kathyrn Drown in Treatment: 5 History of Present Illness HPI Description: Admission 6/10 Ms. Heyli Min is an 87 year old female with a past medical history of paroxysmal A. fib on anticoagulation, congenital heart disease (atrial septal defect status post patch closure), pulmonic stenosis (status post valvotomy 1985), left pulmonary artery aneurysm, complete heart block status post dual-chamber permanent pacemaker implantation that presents to the clinic for a right lateral malleolus wound that developed 3 months ago. She is not quite sure how it started but thinks she may have hit it against her walker. She has been placing Neosporin on this wound daily. She reports pain to the area worse at night and often finds herself laying her leg over the side of the bed. She has been given doxycycline for this issue by her podiatrist. She currently denies signs of infection. She reports trying to offload the area the best she can daily. 6/24; patient presents for 2-week follow-up. She has been using collagen and activating it with K-Y jelly. She has  tolerated this well. She continues to have nocturnal pain at the wound site. She is scheduled to see vein and vascular on 7/6. She denies signs of infection. 7/8; patient presents for 2-week follow-up. She has been using collagen to the wound. She has no issues or complaints today. She did see Dr. Edilia Bo with vein and vascular and was told she had adequate blood flow for wound healing. She  denies signs of infection. 7/15; patient presents for 1 week follow-up. She has tolerated the compression wrap. We changed the dressing last week to Santyl and Hydrofera Blue. She has no issues or complaints today. She denies signs of infection. Electronic Signature(s) Signed: 07/04/2021 10:50:17 AM By: Geralyn Corwin DO Entered By: Geralyn Corwin on 07/04/2021 10:45:20 -------------------------------------------------------------------------------- Physical Exam Details Patient Name: Date of Service: [name] [place]aird, [name] 07/04/2021 8:30 A M Medical Record Number: [id number] Patient Account Number: [id number] Date of Birth/Sex: Treating RN: 10-14-1933 (87 y.o. [name] Baird Primary Care Provider: Bjorn Pippin Other Clinician: Referring Provider: Treating Provider/Extender: Kathyrn Drown in Treatment: 5 Constitutional respirations regular, non-labored and within target range for patient.[name] Kitchen Psychiatric pleasant and cooperative. Notes Right ankle: Lateral malleolus with a small open wound that has dried nonviable tissue overlying it. No increased warmth or erythema or drainage noted. No signs of infection. Decreased pedal pulses Electronic Signature(s) Signed: 07/04/2021 10:50:17 AM By: Geralyn Corwin DO Entered By: Geralyn Corwin on 07/04/2021 10:46:08 -------------------------------------------------------------------------------- Physician Orders Details Patient Name: Date of Service: [name], [name] 07/04/2021 8:30 A M Medical Record Number: [id number] Patient Account  Number: [id number] Date of Birth/Sex: Treating RN: 1933-06-27 (87 y.o. 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 - with Dr. Mikey Bussing Return A Bathing/ Shower/ Hygiene May shower and wash wound with soap and water. Edema Control - Lymphedema / SCD / Other Elevate legs to the level of the heart or above for 30 minutes daily and/or when sitting, a frequency of: - throughout the day Avoid standing for long periods of time. Exercise regularly Additional Orders / Instructions Follow Nutritious Diet Wound Treatment Wound #1 - Malleolus Wound Laterality: Right, Lateral Cleanser: Soap and Water 1 x Per Day/30 Days Discharge Instructions: May shower and wash wound with dial antibacterial soap and water prior to dressing change. Cleanser: Wound Cleanser 1 x Per Day/30 Days Discharge Instructions: Cleanse the wound with wound cleanser prior to applying a clean dressing using gauze sponges, not tissue or cotton balls. Peri-Wound Care: Sween Lotion (Moisturizing lotion) 1 x Per Day/30 Days Discharge Instructions: Apply moisturizing lotion as directed Prim Dressing: Hydrofera Blue Classic Foam, 2x2 in 1 x Per Day/30 Days ary Discharge Instructions: Moisten with saline prior to applying to wound bed Prim Dressing: Santyl Ointment 1 x Per Day/30 Days ary Discharge Instructions: Apply nickel thick amount to wound bed, under Hydrofera Secondary Dressing: Woven Gauze Sponges 2x2 in 1 x Per Day/30 Days Discharge  Instructions: Apply over primary dressing as directed. Compression Wrap: Kerlix Roll 4.5x3.1 (in/yd) 1 x Per Day/30 Days Discharge Instructions: Apply Kerlix and Coban compression as directed. Compression Wrap: Coban Self-Adherent Wrap 4x5 (in/yd) 1 x Per Day/30 Days Discharge Instructions: Apply over Kerlix as directed. Electronic Signature(s) Signed: 07/04/2021 10:50:17 AM By: Geralyn Corwin DO Entered By: Geralyn Corwin on 07/04/2021 10:46:31 -------------------------------------------------------------------------------- Problem List Details Patient Name: Date of Service: [name] [place]aird, [name] 07/04/2021 8:30 A M Medical Record Number: [id number] Patient Account Number: [id number] Date of Birth/Sex: Treating RN: 19-Sep-1933 (87 y.o. 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 07/04/2021 8:30 A M Medical Record Number: [id number] Patient Account Number: [id number] Date of Birth/Sex: Treating RN: 10/29/1933 (87 y.o. [name] Baird Primary Care Provider: Bjorn Pippin Other Clinician: Referring Provider: Treating Provider/Extender: Kathyrn Drown in Treatment: 5 Subjective Chief Complaint Information obtained from Patient Right ankle wound History of Present Illness (HPI) Admission 6/10 Ms. Jeily Guthridge is an 87 year old female with a past medical history of paroxysmal A. fib on anticoagulation, congenital heart disease (atrial septal defect status post patch closure), pulmonic stenosis (status post valvotomy 1985), left pulmonary artery aneurysm, complete heart block status post dual-chamber permanent pacemaker implantation that presents to the clinic for a right lateral malleolus wound that developed 3 months ago. She is not quite sure how it started but thinks she may have hit it against her walker. She has been placing Neosporin on this wound daily. She reports pain to the area worse at night and often finds herself laying her leg over the side of the bed. She has been given doxycycline for this issue by her podiatrist. She currently denies signs of infection. She reports trying to offload the area the best she can daily. 6/24; patient presents for 2-week follow-up. She has been using collagen and activating it with K-Y jelly. She has tolerated this well. She continues to have nocturnal pain at the wound site. She is scheduled to see vein and vascular on 7/6. She denies signs of infection. 7/8; patient presents for 2-week follow-up. She has been using collagen to the wound. She has no issues or complaints today. She did see Dr. Edilia Bo with vein and vascular and was told she had adequate blood flow for wound healing. She denies signs of infection. 7/15; patient presents for 1 week follow-up. She has tolerated the compression wrap. We changed the dressing last week to Santyl and Hydrofera Blue. She has no issues or complaints today. She denies signs of infection. Patient History Information obtained from Patient. Family History Unknown History. Social History Never smoker, Marital Status - Married,  Alcohol Use - Never, Drug Use - No History, Caffeine Use - Rarely. Medical History Eyes Patient has history of Glaucoma Denies history of Cataracts, Optic Neuritis Ear/Nose/Mouth/Throat Denies history of Chronic sinus problems/congestion, Middle ear problems Hematologic/Lymphatic Denies history of Anemia, Hemophilia, Human Immunodeficiency Virus, Lymphedema, Sickle Cell Disease Respiratory Denies history of Aspiration, Asthma, Chronic Obstructive Pulmonary Disease (COPD), Pneumothorax, Sleep Apnea, Tuberculosis Cardiovascular Patient has history of Arrhythmia - atrial flutter, Congestive Heart Failure, Hypertension Denies history of Angina, Coronary Artery Disease, Deep Vein Thrombosis, Hypotension, Myocardial Infarction, Peripheral Arterial Disease, Peripheral Venous Disease, Phlebitis, Vasculitis Gastrointestinal Denies history of Cirrhosis , Colitis, Crohnoos, Hepatitis A, Hepatitis B, Hepatitis C Endocrine Denies history of Type I Diabetes, Type II Diabetes Genitourinary Denies history of End Stage Renal Disease Immunological Denies history of Lupus Erythematosus, Raynaudoos, Scleroderma Integumentary (Skin) Denies history of History of Burn Musculoskeletal Denies history of Gout, Rheumatoid Arthritis,  Osteoarthritis, Osteomyelitis Neurologic Denies history of Dementia, Neuropathy, Quadriplegia, Paraplegia, Seizure Disorder Oncologic Denies history of Received Chemotherapy, Received Radiation Psychiatric Denies history of Anorexia/bulimia, Confinement Anxiety Medical A Surgical History Notes nd Cardiovascular complete heart block s/p medtronic pacemaker status post patch cllosure of ASD Psychiatric anxiety and depression Objective Constitutional respirations regular, non-labored and within target range for patient.. Vitals Time Taken: 8:53 AM, Temperature: 98.3 F, Pulse: 74 bpm, Respiratory Rate: 18 breaths/min, Blood Pressure: 121/76 mmHg. Psychiatric pleasant and  cooperative. General Notes: Right ankle: Lateral malleolus with a small open wound that has dried nonviable tissue overlying it. No increased warmth or erythema or drainage noted. No signs of infection. Decreased pedal pulses Integumentary (Hair, Skin) Wound #1 status is Open. Original cause of wound was Trauma. The date acquired was: 01/21/2021. The wound has been in treatment 5 weeks. The wound is located on the Right,Lateral Malleolus. The wound measures 0.3cm length x 0.2cm width x 0.1cm depth; 0.047cm^2 area and 0.005cm^3 volume. There is Fat Layer (Subcutaneous Tissue) exposed. There is no tunneling or undermining noted. There is a medium amount of serosanguineous drainage noted. The wound margin is distinct with the outline attached to the wound base. There is medium (34-66%) pink, pale granulation within the wound bed. There is a medium (34- 66%) amount of necrotic tissue within the wound bed including Adherent Slough. 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 I debrided nonviable tissue however the surface is pale. I recommended continuing with Santyl and Hydrofera Blue and hopefully we can get some granulation tissue to come up. If this does happen I think we could try endoform or skin substitute. There were no signs of infection on exam. We will also continue the Kerlix/Coban wrap Procedures Wound #1 Pre-procedure diagnosis of Wound #1 is a Pressure Ulcer located on the Right,Lateral Malleolus . There was a Excisional Skin/Subcutaneous Tissue Debridement with a total area of 0.06 sq cm performed by Geralyn CorwinHoffman, Hyun Reali, DO. With the following instrument(s): Curette to remove Non-Viable tissue/material. Material removed includes Subcutaneous Tissue and Slough and after  achieving pain control using Other (benzocaine 20% spray). No specimens were taken. A time out was conducted at 09:30, prior to the start of the procedure. A Minimum amount of bleeding was controlled with Pressure. The procedure was tolerated well with a pain level of 4 throughout and a pain level of 2 following the procedure. Post Debridement Measurements: 0.3cm length x 0.2cm width x 0.1cm depth; 0.005cm^3 volume. Post debridement Stage noted as Unstageable/Unclassified. Character of Wound/Ulcer Post Debridement requires further debridement. Post procedure Diagnosis Wound #1: Same as Pre-Procedure Plan Follow-up Appointments: Return Appointment in 1 week. - with Dr. Mikey BussingHoffman Bathing/ Shower/ Hygiene: May shower and wash wound with soap and water. Edema Control - Lymphedema / SCD / Other: Elevate legs to the level of the heart or above for 30 minutes daily and/or when sitting, a frequency of: - throughout the day Avoid standing for long periods of time. Exercise regularly Additional Orders / Instructions: Follow Nutritious Diet WOUND #1: - Malleolus Wound Laterality: Right, Lateral Cleanser: Soap and Water 1 x Per Day/30 Days Discharge Instructions: May shower and wash wound with dial antibacterial soap and water prior to dressing change. Cleanser: Wound Cleanser 1 x Per Day/30 Days Discharge Instructions: Cleanse the wound with wound cleanser prior to applying a clean dressing using  gauze sponges, not tissue or cotton balls. Peri-Wound Care: Sween Lotion (Moisturizing lotion) 1 x Per Day/30 Days Discharge Instructions: Apply moisturizing lotion as directed Prim Dressing: Hydrofera Blue Classic Foam, 2x2 in 1 x Per Day/30 Days ary Discharge Instructions: Moisten with saline prior to applying to wound bed Prim Dressing: Santyl Ointment 1 x Per Day/30 Days ary Discharge Instructions: Apply nickel thick amount to wound bed, under Hydrofera Secondary Dressing: Woven Gauze Sponges 2x2 in 1 x  Per Day/30 Days Discharge Instructions: Apply over primary dressing as directed. Com pression Wrap: Kerlix Roll 4.5x3.1 (in/yd) 1 x Per Day/30 Days Discharge Instructions: Apply Kerlix and Coban compression as directed. Com pression Wrap: Coban Self-Adherent Wrap 4x5 (in/yd) 1 x Per Day/30 Days Discharge Instructions: Apply over Kerlix as directed. 1. In office sharp debridement 2. Hydrofera Blue and Santyl under Kerlix/Coban 3. Follow-up in 1 week Electronic Signature(s) Signed: 07/04/2021 10:50:17 AM By: Geralyn Corwin DO Entered By: Geralyn Corwin on 07/04/2021 10:49:39 -------------------------------------------------------------------------------- HxROS Details Patient Name: Date of Service: [name] Baird, [name] 07/04/2021 8:30 A M Medical Record Number: [id number] Patient Account Number: [id number] Date of Birth/Sex: Treating RN: [birth year] (87 y.o. [name] Baird Primary Care Provider: Bjorn Pippin Other Clinician: Referring Provider: Treating Provider/Extender: Kathyrn Drown in Treatment: 5 Information Obtained From Patient Eyes Medical History: Positive for: Glaucoma Negative for: Cataracts; Optic Neuritis Ear/Nose/Mouth/Throat Medical History: Negative for: Chronic sinus problems/congestion; Middle ear problems Hematologic/Lymphatic Medical History: Negative for: Anemia; Hemophilia; Human Immunodeficiency Virus; Lymphedema; Sickle Cell Disease Respiratory Medical History: Negative for: Aspiration; Asthma; Chronic Obstructive Pulmonary Disease (COPD); Pneumothorax; Sleep Apnea; Tuberculosis Cardiovascular Medical History: Positive for: Arrhythmia - atrial flutter; Congestive Heart Failure; Hypertension Negative for: Angina; Coronary Artery Disease; Deep Vein Thrombosis; Hypotension; Myocardial Infarction; Peripheral Arterial Disease; Peripheral Venous Disease; Phlebitis; Vasculitis Past Medical History Notes: complete heart block s/p  medtronic pacemaker status post patch cllosure of ASD Gastrointestinal Medical History: Negative for: Cirrhosis ; Colitis; Crohns; Hepatitis A; Hepatitis B; Hepatitis C Endocrine Medical History: Negative for: Type I Diabetes; Type II Diabetes Genitourinary Medical History: Negative for: End Stage Renal Disease Immunological Medical History: Negative for: Lupus Erythematosus; Raynauds; Scleroderma Integumentary (Skin) Medical History: Negative for: History of Burn Musculoskeletal Medical History: Negative for: Gout; Rheumatoid Arthritis; Osteoarthritis; Osteomyelitis Neurologic Medical History: Negative for: Dementia; Neuropathy; Quadriplegia; Paraplegia; Seizure Disorder Oncologic Medical History: Negative for: Received Chemotherapy; Received Radiation Psychiatric Medical History: Negative for: Anorexia/bulimia; Confinement Anxiety Past Medical History Notes: anxiety and depression HBO Extended History Items Eyes: Glaucoma Immunizations Pneumococcal Vaccine: Received Pneumococcal Vaccination: Yes Implantable Devices Yes Family and Social History Unknown History: Yes; Never smoker; Marital Status - Married; Alcohol Use: Never; Drug Use: No History; Caffeine Use: Rarely; Financial Concerns: No; Food, Clothing or Shelter Needs: No; Support System Lacking: No; Transportation Concerns: No Electronic Signature(s) Signed: 07/04/2021 10:50:17 AM By: Geralyn Corwin DO Signed: 07/04/2021 12:39:56 PM By: Zenaida Deed RN, BSN Entered By: Geralyn Corwin on 07/04/2021 10:45:27 -------------------------------------------------------------------------------- SuperBill Details Patient Name: Date of Service: [name] Baird, [name] 07/04/2021 Medical Record Number: [id number] Patient Account Number: [id number] Date of Birth/Sex: Treating RN: [birth year] (87 y.o. 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

## 2021-07-04 NOTE — Progress Notes (Signed)
[name] Baird, [name] S. ([id number]) Visit Report for 07/04/2021 Arrival Information Details Patient Name: Date of Service: [place], [name] S. 07/04/2021 8:30 A M Medical Record Number: [id number] Patient Account Number: [id number] Date of Birth/Sex: Treating RN: 1933-08-05 (87 y.o. [name] Baird) Barnhart, Baird Primary Care [name] Baird: [name] Baird Other Clinician: Referring Meila Berke: Treating Willard Madrigal/Extender: Kathyrn DrownHoffman, Jessica Gray, Baird Weeks in Treatment: 5 Visit Information History Since Last Visit Added or deleted any medications: No Patient Arrived: Wheel Chair Any [name] allergies or adverse reactions: No Arrival Time: 08:49 Had a fall or experienced change in No Accompanied By: Daughter activities of daily living that may affect Transfer Assistance: Manual risk of falls: Patient Identification Verified: Yes Signs or symptoms of abuse/neglect since last visito No Secondary Verification Process Completed: Yes Hospitalized since last visit: No Patient Requires Transmission-Based Precautions: No Implantable device outside of the clinic excluding No Patient Has Alerts: No cellular tissue based products placed in the center since last visit: Has Dressing in Place as Prescribed: Yes Has Compression in Place as Prescribed: Yes Pain Present Now: Yes Electronic Signature(s) Signed: 07/04/2021 12:14:30 PM By: [name] Baird Entered By: [name] Baird on 07/04/2021 08:53:41 -------------------------------------------------------------------------------- Encounter Discharge Information Details Patient Name: Date of Service: [name] MeckelBUSICK, [name] NA S. 07/04/2021 8:30 A M Medical Record Number: [id number] Patient Account Number: [id number] Date of Birth/Sex: Treating RN: 1933-08-05 (87 y.o. [name] Baird) [name] Baird Primary Care Kimia Finan: [name] Baird Other Clinician: Referring Irbin Fines: Treating Slayden Mennenga/Extender: Kathyrn DrownHoffman, Jessica Gray, Baird Weeks in Treatment: 5 Encounter Discharge Information Items Post Procedure  Vitals Discharge Condition: Stable Temperature (F): 98.3 Ambulatory Status: Wheelchair Pulse (bpm): 74 Discharge Destination: Home Respiratory Rate (breaths/min): 18 Transportation: Private Auto Blood Pressure (mmHg): 121/76 Accompanied By: daughter Schedule Follow-up Appointment: Yes Clinical Summary of Care: Patient Declined Electronic Signature(s) Signed: 07/04/2021 12:39:56 PM By: [name] DeedBoehlein, Linda RN, BSN Entered By: [name] Baird on 07/04/2021 10:41:50 -------------------------------------------------------------------------------- Lower Extremity Assessment Details Patient Name: Date of Service: [name] JohnBUSICK, [name] [place] S. 07/04/2021 8:30 A M Medical Record Number: [id number] Patient Account Number: [id number] Date of Birth/Sex: Treating RN: 1933-08-05 (87 y.o. [name] Baird) Barnhart, Baird Primary Care Alysah Carton: [name] Baird Other Clinician: Referring Lodema Parma: Treating Alaya Iverson/Extender: Kathyrn DrownHoffman, Jessica Gray, Baird Weeks in Treatment: 5 Edema Assessment Assessed: Kyra Searles[Left: No] Franne Forts[Right: Yes] Edema: [Left: Ye] [Right: s] Calf Left: Right: Point of Measurement: 27 cm From Medial Instep 29.4 cm Ankle Left: Right: Point of Measurement: 10 cm From Medial Instep 18.4 cm Vascular Assessment Pulses: Dorsalis Pedis Palpable: [Right:Yes] Electronic Signature(s) Signed: 07/04/2021 12:14:30 PM By: [name] Baird Entered By: [name] Baird on 07/04/2021 09:00:48 -------------------------------------------------------------------------------- Multi Wound Chart Details Patient Name: Date of Service: [name] MeckelBUSICK, [name] NA S. 07/04/2021 8:30 A M Medical Record Number: [id number] Patient Account Number: [id number] Date of Birth/Sex: Treating RN: 1933-08-05 (87 y.o. [name] Baird) [name] Baird Primary Care Bardia Wangerin: [name] Baird Other Clinician: Referring Delmer Kowalski: Treating Cruz Devilla/Extender: Kathyrn DrownHoffman, Jessica Gray, Baird Weeks in Treatment: 5 Vital Signs Height(in): Pulse(bpm): 74 Weight(lbs): Blood  Pressure(mmHg): 121/76 Body Mass Index(BMI): Temperature(F): 98.3 Respiratory Rate(breaths/min): 18 Photos: [1:No Photos Right, Lateral Malleolus] [N/A:N/A N/A] Wound Location: [1:Trauma] [N/A:N/A] Wounding Event: [1:Pressure Ulcer] [N/A:N/A] Primary Etiology: [1:Glaucoma, Arrhythmia, Congestive] [N/A:N/A] Comorbid History: [1:Heart Failure, Hypertension 01/21/2021] [N/A:N/A] Date Acquired: [1:5] [N/A:N/A] Weeks of Treatment: [1:Open] [N/A:N/A] Wound Status: [1:0.3x0.2x0.1] [N/A:N/A] Measurements L x W x D (cm) [1:0.047] [N/A:N/A] A (cm) : rea [1:0.005] [N/A:N/A] Volume (cm) : [1:0.00%] [N/A:N/A] % Reduction in A rea: [1:44.40%] [N/A:N/A] % Reduction in Volume: [1:Unstageable/Unclassified] [N/A:N/A] Classification: [1:Medium] [N/A:N/A] Exudate A mount: [1:Serosanguineous] [N/A:N/A] Exudate Type: [1:red, brown] [N/A:N/A] Exudate Color: [  1:Distinct, outline attached] [N/A:N/A] Wound Margin: [1:Medium (34-66%)] [N/A:N/A] Granulation A mount: [1:Pink, Pale] [N/A:N/A] Granulation Quality: [1:Medium (34-66%)] [N/A:N/A] Necrotic A mount: [1:Fat Layer (Subcutaneous Tissue): Yes N/A] Exposed Structures: [1:Fascia: No Tendon: No Muscle: No Joint: No Bone: No Small (1-33%)] [N/A:N/A] Epithelialization: [1:Debridement - Excisional] [N/A:N/A] Debridement: Pre-procedure Verification/Time Out 09:30 [N/A:N/A] Taken: [1:Other] [N/A:N/A] Pain Control: [1:Subcutaneous, Slough] [N/A:N/A] Tissue Debrided: [1:Skin/Subcutaneous Tissue] [N/A:N/A] Level: [1:0.06] [N/A:N/A] Debridement A (sq cm): [1:rea Curette] [N/A:N/A] Instrument: [1:Minimum] [N/A:N/A] Bleeding: [1:Pressure] [N/A:N/A] Hemostasis A chieved: [1:4] [N/A:N/A] Procedural Pain: [1:2] [N/A:N/A] Post Procedural Pain: [1:Procedure was tolerated well] [N/A:N/A] Debridement Treatment Response: [1:0.3x0.2x0.1] [N/A:N/A] Post Debridement Measurements L x W x D (cm) [1:0.005] [N/A:N/A] Post Debridement Volume: (cm)  [1:Unstageable/Unclassified] [N/A:N/A] Post Debridement Stage: [1:Debridement] [N/A:N/A] Treatment Notes Wound #1 (Malleolus) Wound Laterality: Right, Lateral Cleanser Soap and Water Discharge Instruction: May shower and wash wound with dial antibacterial soap and water prior to dressing change. Wound Cleanser Discharge Instruction: Cleanse the wound with wound cleanser prior to applying a clean dressing using gauze sponges, not tissue or cotton balls. Peri-Wound Care Sween Lotion (Moisturizing lotion) Discharge Instruction: Apply moisturizing lotion as directed Topical Primary Dressing Hydrofera Blue Classic Foam, 2x2 in Discharge Instruction: Moisten with saline prior to applying to wound bed Santyl Ointment Discharge Instruction: Apply nickel thick amount to wound bed, under Hydrofera Secondary Dressing Woven Gauze Sponges 2x2 in Discharge Instruction: Apply over primary dressing as directed. Secured With Compression Wrap Kerlix Roll 4.5x3.1 (in/yd) Discharge Instruction: Apply Kerlix and Coban compression as directed. Coban Self-Adherent Wrap 4x5 (in/yd) Discharge Instruction: Apply over Kerlix as directed. Compression Stockings Add-Ons Electronic Signature(s) Signed: 07/04/2021 10:50:17 AM By: Geralyn Corwin DO Signed: 07/04/2021 12:39:56 PM By: [name] Deed RN, BSN Signed: 07/04/2021 12:39:56 PM By: [name] Deed RN, BSN Entered By: Geralyn Corwin on 07/04/2021 10:44:30 -------------------------------------------------------------------------------- Multi-Disciplinary Care Plan Details Patient Name: Date of Service: [name] Baird, [name] [place] S. 07/04/2021 8:30 A M Medical Record Number: [id number] Patient Account Number: [id number] Date of Birth/Sex: Treating RN: 1933-04-07 (87 y.o. [name] Standard Primary Care Jeimy Bickert: [name] Pippin Other Clinician: Referring Emin Foree: Treating Danyele Smejkal/Extender: Kathyrn Drown in Treatment: 5 Active  Inactive Wound/Skin Impairment Nursing Diagnoses: Impaired tissue integrity Goals: Patient/caregiver will verbalize understanding of skin care regimen Date Initiated: 05/30/2021 Target Resolution Date: 07/25/2021 Goal Status: Active Ulcer/skin breakdown will have a volume reduction of 30% by week 4 Date Initiated: 05/30/2021 Date Inactivated: 06/27/2021 Target Resolution Date: 06/27/2021 Goal Status: Unmet Unmet Reason: non viable tissue Interventions: Assess patient/caregiver ability to obtain necessary supplies Assess patient/caregiver ability to perform ulcer/skin care regimen upon admission and as needed Assess ulceration(s) every visit Provide education on ulcer and skin care Treatment Activities: Skin care regimen initiated : 05/30/2021 Topical wound management initiated : 05/30/2021 Notes: Electronic Signature(s) Signed: 07/04/2021 12:39:56 PM By: [name] Deed RN, BSN Entered By: [name] Deed on 07/04/2021 08:57:31 -------------------------------------------------------------------------------- Pain Assessment Details Patient Name: Date of Service: [name] Baird NA S. 07/04/2021 8:30 A M Medical Record Number: [id number] Patient Account Number: [id number] Date of Birth/Sex: Treating RN: [birth year] (87 y.o. [name] Cluck Primary Care Kanaya Gunnarson: [name] Pippin Other Clinician: Referring Brindley Madarang: Treating Aran Menning/Extender: Kathyrn Drown in Treatment: 5 Active Problems Location of Pain Severity and Description of Pain Patient Has Paino Yes Site Locations Pain Location: Pain Location: Pain in Ulcers With Dressing Change: Yes Rate the pain. Current Pain Level: 5 Character of Pain Describe the Pain: Aching, Burning, Throbbing Pain Management and Medication Current Pain Management: Medication: Yes Cold Application:  No Rest: Yes Massage: No Activity: No T.E.N.S.: No Heat Application: No Leg drop or elevation: No Is the Current Pain  Management Adequate: Inadequate How does your wound impact your activities of daily livingo Sleep: Yes Bathing: No Appetite: No Relationship With Others: No Bladder Continence: No Emotions: No Bowel Continence: No Work: No Toileting: No Drive: No Dressing: No Hobbies: No Electronic Signature(s) Signed: 07/04/2021 12:14:30 PM By: [name] Iba Entered By: [name] Iba on 07/04/2021 08:54:59 -------------------------------------------------------------------------------- Patient/Caregiver Education Details Patient Name: Date of Service: Breton, [name] NA S. 7/15/2022andnbsp8:30 A M Medical Record Number: [id number] Patient Account Number: [id number] Date of Birth/Gender: Treating RN: 05-16-1933 (87 y.o. [name] Standard Primary Care Physician: [name] Pippin Other Clinician: Referring Physician: Treating Physician/Extender: Kathyrn Drown in Treatment: 5 Education Assessment Education Provided To: Patient Education Topics Provided Wound/Skin Impairment: Methods: Explain/Verbal Responses: Reinforcements needed, State content correctly Electronic Signature(s) Signed: 07/04/2021 12:39:56 PM By: [name] Deed RN, BSN Entered By: [name] Deed on 07/04/2021 08:57:53 -------------------------------------------------------------------------------- Wound Assessment Details Patient Name: Date of Service: [name] Baird NA S. 07/04/2021 8:30 A M Medical Record Number: [id number] Patient Account Number: [id number] Date of Birth/Sex: Treating RN: 11-06-1933 (87 y.o. [name] Cluck Primary Care Ryli Standlee: [name] Pippin Other Clinician: Referring Chanoch Mccleery: Treating Adaira Centola/Extender: Kathyrn Drown in Treatment: 5 Wound Status Wound Number: 1 Primary Etiology: Pressure Ulcer Wound Location: Right, Lateral Malleolus Wound Status: Open Wounding Event: Trauma Comorbid Glaucoma, Arrhythmia, Congestive Heart Failure, History:  Hypertension Date Acquired: 01/21/2021 Weeks Of Treatment: 5 Clustered Wound: No Photos Wound Measurements Length: (cm) 0.3 Width: (cm) 0.2 Depth: (cm) 0.1 Area: (cm) 0.047 Volume: (cm) 0.005 % Reduction in Area: 0% % Reduction in Volume: 44.4% Epithelialization: Small (1-33%) Tunneling: No Undermining: No Wound Description Classification: Unstageable/Unclassified Wound Margin: Distinct, outline attached Exudate Amount: Medium Exudate Type: Serosanguineous Exudate Color: red, brown Foul Odor After Cleansing: No Slough/Fibrino Yes Wound Bed Granulation Amount: Medium (34-66%) Exposed Structure Granulation Quality: Pink, Pale Fascia Exposed: No Necrotic Amount: Medium (34-66%) Fat Layer (Subcutaneous Tissue) Exposed: Yes Necrotic Quality: Adherent Slough Tendon Exposed: No Muscle Exposed: No Joint Exposed: No Bone Exposed: No Treatment Notes Wound #1 (Malleolus) Wound Laterality: Right, Lateral Cleanser Soap and Water Discharge Instruction: May shower and wash wound with dial antibacterial soap and water prior to dressing change. Wound Cleanser Discharge Instruction: Cleanse the wound with wound cleanser prior to applying a clean dressing using gauze sponges, not tissue or cotton balls. Peri-Wound Care Sween Lotion (Moisturizing lotion) Discharge Instruction: Apply moisturizing lotion as directed Topical Primary Dressing Hydrofera Blue Classic Foam, 2x2 in Discharge Instruction: Moisten with saline prior to applying to wound bed Santyl Ointment Discharge Instruction: Apply nickel thick amount to wound bed, under Hydrofera Secondary Dressing Woven Gauze Sponges 2x2 in Discharge Instruction: Apply over primary dressing as directed. Secured With Compression Wrap Kerlix Roll 4.5x3.1 (in/yd) Discharge Instruction: Apply Kerlix and Coban compression as directed. Coban Self-Adherent Wrap 4x5 (in/yd) Discharge Instruction: Apply over Kerlix as directed. Compression  Stockings Add-Ons Electronic Signature(s) Signed: 07/04/2021 12:09:51 PM By: Karl Ito Signed: 07/04/2021 12:14:30 PM By: [name] Iba Entered By: Karl Ito on 07/04/2021 12:00:24 -------------------------------------------------------------------------------- Vitals Details Patient Name: Date of Service: [name] Baird, [name] NA S. 07/04/2021 8:30 A M Medical Record Number: [id number] Patient Account Number: [id number] Date of Birth/Sex: Treating RN: [birth year] (87 y.o. [name] Cluck Primary Care Pernell Lenoir: [name] Pippin Other Clinician: Referring Amariyah Bazar: Treating Azalyn Sliwa/Extender: Kathyrn Drown in Treatment: 5 Vital Signs Time Taken: 08:53 Temperature (F): 98.3 Pulse (  bpm): 74 Respiratory Rate (breaths/min): 18 Blood Pressure (mmHg): 121/76 Reference Range: 80 - 120 mg / dl Electronic Signature(s) Signed: 07/04/2021 12:14:30 PM By: [name] Iba Entered By: [name] Iba on 07/04/2021 08:54:03

## 2021-07-11 ENCOUNTER — Encounter (HOSPITAL_BASED_OUTPATIENT_CLINIC_OR_DEPARTMENT_OTHER): Payer: Medicare Other | Admitting: Internal Medicine

## 2021-07-11 ENCOUNTER — Other Ambulatory Visit: Payer: Self-pay

## 2021-07-11 DIAGNOSIS — L97319 Non-pressure chronic ulcer of right ankle with unspecified severity: Secondary | ICD-10-CM

## 2021-07-11 NOTE — Progress Notes (Signed)
[name] Baird, [name] Baird ([id number]) Visit Report for 07/11/2021 Chief Complaint Document Details Patient Name: Date of Service: [name], [name] 07/11/2021 8:30 A M Medical Record Number: [id number] Patient Account Number: [id number] Date of Birth/Sex: Treating RN: 1933/05/31 (87 y.o. Tommye Standard Primary Care Provider: Bjorn Pippin Other Clinician: Referring Provider: Treating Provider/Extender: Kathyrn Drown in Treatment: 6 Information Obtained from: Patient Chief Complaint Right ankle wound Electronic Signature(s) Signed: 07/11/2021 10:34:18 AM By: Geralyn Corwin DO Entered By: Geralyn Corwin on 07/11/2021 10:30:21 -------------------------------------------------------------------------------- Debridement Details Patient Name: Date of Service: [name] Baird NA S. 07/11/2021 8:30 A M Medical Record Number: [id number] Patient Account Number: [id number] Date of Birth/Sex: Treating RN: [birth year] (87 y.o. Tommye Standard Primary Care Provider: Bjorn Pippin Other Clinician: Referring Provider: Treating Provider/Extender: Kathyrn Drown in Treatment: 6 Debridement Performed for Assessment: Wound #1 Right,Lateral Malleolus Performed By: Physician Geralyn Corwin, DO Debridement Type: Debridement Level of Consciousness (Pre-procedure): Awake and Alert Pre-procedure Verification/Time Out Yes - 09:45 Taken: Start Time: 09:45 Pain Control: Other : benzocaine 20% spray T Area Debrided (L x W): otal 0.3 (cm) x 0.4 (cm) = 0.12 (cm) Tissue and other material debrided: Viable, Non-Viable, Slough, Subcutaneous, Slough Level: Skin/Subcutaneous Tissue Debridement Description: Excisional Instrument: Curette, N/A Bleeding: Minimum Hemostasis Achieved: Pressure End Time: 09:50 Procedural Pain: 5 Post Procedural Pain: 1 Response to Treatment: Procedure was tolerated well Level of Consciousness (Post- Awake and Alert procedure): Post  Debridement Measurements of Total Wound Length: (cm) 0.3 Stage: Category/Stage III Width: (cm) 0.4 Depth: (cm) 0.1 Volume: (cm) 0.009 Character of Wound/Ulcer Post Debridement: Improved Post Procedure Diagnosis Same as Pre-procedure Electronic Signature(s) Signed: 07/11/2021 10:34:18 AM By: Geralyn Corwin DO Signed: 07/11/2021 1:37:28 PM By: Zenaida Deed RN, BSN Entered By: Zenaida Deed on 07/11/2021 09:49:07 -------------------------------------------------------------------------------- HPI Details Patient Name: Date of Service: [name] Baird, [name] 07/11/2021 8:30 A M Medical Record Number: [id number] Patient Account Number: [id number] Date of Birth/Sex: Treating RN: [birth year] (87 y.o. Tommye Standard Primary Care Provider: Bjorn Pippin Other Clinician: Referring Provider: Treating Provider/Extender: Kathyrn Drown in Treatment: 6 History of Present Illness HPI Description: Admission 6/10 [name] is an 87 year old female with a past medical history of paroxysmal A. fib on anticoagulation, congenital heart disease (atrial septal defect status post patch closure), pulmonic stenosis (status post valvotomy 1985), left pulmonary artery aneurysm, complete heart block status post dual-chamber permanent pacemaker implantation that presents to the clinic for a right lateral malleolus wound that developed 3 months ago. She is not quite sure how it started but thinks she may have hit it against her walker. She has been placing Neosporin on this wound daily. She reports pain to the area worse at night and often finds herself laying her leg over the side of the bed. She has been given doxycycline for this issue by her podiatrist. She currently denies signs of infection. She reports trying to offload the area the best she can daily. 6/24; patient presents for 2-week follow-up. She has been using collagen and activating it with K-Y jelly. She has tolerated this  well. She continues to have nocturnal pain at the wound site. She is scheduled to see vein and vascular on 7/6. She denies signs of infection. 7/8; patient presents for 2-week follow-up. She has been using collagen to the wound. She has no issues or complaints today. She did see Dr. Edilia Bo with vein and vascular and was told she had adequate blood flow for wound healing.  She denies signs of infection. 7/15; patient presents for 1 week follow-up. She has tolerated the compression wrap. We changed the dressing last week to Santyl and Hydrofera Blue. She has no issues or complaints today. She denies signs of infection. 7/22; patient presents for 1 week follow-up. She continues to tolerate the compression wrap well. We were using Hydrofera Blue and Santyl under this. She has no issues or complaints today. She thinks the wound has improved slightly over the past week. She denies signs of infection. Electronic Signature(s) Signed: 07/11/2021 10:34:18 AM By: Geralyn Corwin DO Entered By: Geralyn Corwin on 07/11/2021 10:30:58 -------------------------------------------------------------------------------- Physical Exam Details Patient Name: Date of Service: [name] Baird NA S. 07/11/2021 8:30 A M Medical Record Number: [id number] Patient Account Number: [id number] Date of Birth/Sex: Treating RN: September 13, 1933 (87 y.o. Tommye Standard Primary Care Provider: Bjorn Pippin Other Clinician: Referring Provider: Treating Provider/Extender: Kathyrn Drown in Treatment: 6 Constitutional respirations regular, non-labored and within target range for patient.. Cardiovascular 2+ dorsalis pedis/posterior tibialis pulses. Psychiatric pleasant and cooperative. Notes Right ankle: Lateral malleolus with a small open wound with nonviable tissue in the center. No increased warmth or erythema or drainage noted. No signs of infection. Electronic Signature(s) Signed: 07/11/2021 10:34:18 AM By:  Geralyn Corwin DO Entered By: Geralyn Corwin on 07/11/2021 10:32:18 -------------------------------------------------------------------------------- Physician Orders Details Patient Name: Date of Service: [name] Baird, [name] 07/11/2021 8:30 A M Medical Record Number: [id number] Patient Account Number: [id number] Date of Birth/Sex: Treating RN: 11/25/1933 (87 y.o. 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 - with Dr. Mikey Bussing Return A Bathing/ Shower/ Hygiene May shower and wash wound with soap and water. Edema Control - Lymphedema / SCD / Other Elevate legs to the level of the heart or above for 30 minutes daily and/or when sitting, a frequency of: - throughout the day Avoid standing for long periods of time. Exercise regularly Moisturize legs daily. Additional Orders / Instructions Follow Nutritious Diet Wound Treatment Wound #1 - Malleolus Wound Laterality: Right, Lateral Cleanser: Soap and Water 1 x Per Day/30 Days Discharge Instructions: May shower and wash wound with dial antibacterial soap and water prior to dressing change. Cleanser: Wound Cleanser 1 x Per Day/30 Days Discharge Instructions: Cleanse the wound with wound cleanser prior to applying a clean dressing using gauze sponges, not tissue or cotton balls. Prim Dressing: Santyl Ointment 1 x Per Day/30 Days ary Discharge Instructions: Apply nickel thick amount to wound bed, under Hydrofera Secondary Dressing: ComfortFoam Border, 4x4 in  (silicone border) (DME) (Generic) 1 x Per Day/30 Days Discharge Instructions: Apply over primary dressing as directed. Patient Medications llergies: Sulfa (Sulfonamide Antibiotics), hydrocodone A Notifications Medication Indication Start End 07/11/2021 Santyl DOSE 1 - topical 250 unit/gram ointment - 1 gm ointment topical to wound daily Electronic Signature(s) Signed: 07/11/2021 1:37:28 PM By: Zenaida Deed RN, BSN Signed: 07/11/2021 3:17:49 PM By: Geralyn Corwin DO Previous Signature: 07/11/2021 10:34:18 AM Version By: Geralyn Corwin DO Entered By: Zenaida Deed on 07/11/2021 12:18:14 Prescription 07/11/2021 -------------------------------------------------------------------------------- Rudean Curt. Geralyn Corwin DO Patient Name: Provider: 08/11/1933 [id number] Date of Birth: NPI#: [id number] Sex: DEA #: [phone]  Phone #: License #: [facility] Gunnels [facility] Patient Address: [address] [place] [address] , [address] [place], [place] [address] [phone] Allergies Sulfa (Sulfonamide Antibiotics); hydrocodone Medication Medication: Route: Strength: Form: Santyl 250 unit/gram topical ointment topical 250 unit/gram ointment Class: TOPICAL/MUCOUS MEMBR./SUBCUT ENZYMES . Dose: Frequency / Time: Indication: 1 1 gm ointment topical to wound daily Number of Refills: Number of Units: 1 Thirty (30) Generic Substitution: Start Date: End Date: One Time Use: Substitution Permitted 07/11/2021 No Note to Pharmacy: Hand Signature: Date(s): Electronic Signature(s) Signed: 07/11/2021 1:37:28 PM By: Zenaida Deed RN, BSN Signed: 07/11/2021 3:17:49 PM By: Geralyn Corwin DO Previous Signature: 07/11/2021 10:34:18 AM Version By: Geralyn Corwin DO Entered By: Zenaida Deed on 07/11/2021 12:18:14 -------------------------------------------------------------------------------- Problem List  Details Patient Name: Date of Service: [name] Baird NA S. 07/11/2021 8:30 A M Medical Record Number: [id number] Patient Account Number: [id number] Date of Birth/Sex: Treating RN: 08-18-1933 (87 y.o. 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 05/30/2021 No Yes I48.0 Paroxysmal atrial fibrillation 05/30/2021 No Yes Z79.01 Long term (current) use of anticoagulants 05/30/2021 No Yes Z95.0 Presence of cardiac pacemaker 05/30/2021 No Yes M79.606 Pain in leg, unspecified 05/30/2021 No Yes I87.2 Venous insufficiency (chronic) (peripheral) 07/11/2021 No Yes Inactive Problems Resolved Problems Electronic Signature(s) Signed: 07/11/2021 10:34:18 AM By: Geralyn Corwin DO Entered By: Geralyn Corwin on 07/11/2021 10:30:02 -------------------------------------------------------------------------------- Progress Note Details Patient Name: Date of Service: [name] Baird, [name] 07/11/2021 8:30 A M Medical Record Number: [id number] Patient Account Number: [id number] Date of Birth/Sex: Treating RN: 1933/02/12 (87 y.o. Tommye Standard Primary Care Provider: Bjorn Pippin Other Clinician: Referring Provider: Treating Provider/Extender: Kathyrn Drown in Treatment: 6 Subjective Chief Complaint Information obtained from Patient Right ankle wound History of Present Illness (HPI) Admission 6/10 Ms. Cosette Prindle is an 87 year old female with a past medical history of paroxysmal A. fib on anticoagulation, congenital heart disease (atrial septal defect status post patch closure), pulmonic stenosis (status post valvotomy 1985), left pulmonary artery aneurysm, complete heart block status post dual-chamber permanent  pacemaker implantation that presents to the clinic for a right lateral malleolus wound that developed 3 months ago. She is not quite sure how it started but thinks she may have hit it against her walker. She has been placing Neosporin on this wound daily. She reports pain to the area worse at night and often finds herself laying her leg over the side of the bed. She has been given doxycycline for this issue by her podiatrist. She currently denies signs of infection. She reports trying to offload the area the best she can daily. 6/24; patient presents for 2-week follow-up. She has been using collagen and activating it with K-Y jelly. She has tolerated this well. She continues to have nocturnal pain at the wound site. She is scheduled to see vein and vascular on 7/6. She denies signs of infection. 7/8; patient presents for 2-week follow-up. She has been using collagen to the wound. She has no issues or complaints today. She did see Dr. Edilia Bo with vein and vascular and was told she had adequate blood flow for wound healing. She denies signs of infection. 7/15; patient presents for 1 week follow-up. She has tolerated the compression wrap. We [facility] the [facility]  [facility] and [employer]	[place]. She has no issues or complaints today. She denies signs of infection. 7/22; patient presents for 1 week follow-up. She continues to tolerate the compression wrap well. We were using Hydrofera Blue and Santyl under this. She has no issues or complaints today. She thinks the wound has improved slightly over the past week. She denies signs of infection. Patient History Information obtained from Patient. Family History Unknown History. Social History Never smoker, Marital Status - Married, Alcohol Use - Never, Drug Use - No History, Caffeine Use - Rarely. Medical History Eyes Patient has history of Glaucoma Denies history of Cataracts, Optic Neuritis Ear/Nose/Mouth/Throat Denies history of Chronic  sinus problems/congestion, Middle ear problems Hematologic/Lymphatic Denies history of Anemia, Hemophilia, Human Immunodeficiency Virus, Lymphedema, Sickle Cell Disease Respiratory Denies history of Aspiration, Asthma, Chronic Obstructive Pulmonary Disease (COPD), Pneumothorax, Sleep Apnea, Tuberculosis Cardiovascular Patient has history of Arrhythmia - atrial flutter, Congestive Heart Failure, Hypertension Denies history of Angina, Coronary Artery Disease, Deep Vein Thrombosis, Hypotension, Myocardial Infarction, Peripheral Arterial Disease, Peripheral Venous Disease, Phlebitis, Vasculitis Gastrointestinal Denies history of Cirrhosis , Colitis, Crohnoos, Hepatitis A, Hepatitis B, Hepatitis C Endocrine Denies history of Type I Diabetes, Type II Diabetes Genitourinary Denies history of End Stage Renal Disease Immunological Denies history of Lupus Erythematosus, Raynaudoos, Scleroderma Integumentary (Skin) Denies history of History of Burn Musculoskeletal Denies history of Gout, Rheumatoid Arthritis, Osteoarthritis, Osteomyelitis Neurologic Denies history of Dementia, Neuropathy, Quadriplegia, Paraplegia, Seizure Disorder Oncologic Denies history of Received Chemotherapy, Received Radiation Psychiatric Denies history of Anorexia/bulimia, Confinement Anxiety Medical A Surgical History Notes nd Cardiovascular complete heart block s/p medtronic pacemaker status post patch cllosure of ASD Psychiatric anxiety and depression Objective Constitutional respirations regular, non-labored and within target range for patient.. Vitals Time Taken: 8:47 AM, Temperature: 97.7 F, Pulse: 71 bpm, Respiratory Rate: 18 breaths/min, Blood Pressure: 137/80 mmHg. Cardiovascular 2+ dorsalis pedis/posterior tibialis pulses. Psychiatric pleasant and cooperative. General Notes: Right ankle: Lateral malleolus with a small open wound with nonviable tissue in the center. No increased warmth or erythema or  drainage noted. No signs of infection. Integumentary (Hair, Skin) Wound #1 status is Open. Original cause of wound was Trauma. The date acquired was: 01/21/2021. The wound has been in treatment 6 weeks. The wound is located on the Right,Lateral Malleolus. The wound measures 0.3cm length x 0.4cm width x 0.1cm depth; 0.094cm^2 area and 0.009cm^3 volume. There is Fat Layer (Subcutaneous Tissue) exposed. There is no tunneling or undermining noted. There is a medium amount of serosanguineous drainage noted. The wound margin is distinct with the outline attached to the wound base. There is medium (34-66%) pink, pale granulation within the wound bed. There is a medium (34- 66%) amount of necrotic tissue within the wound bed including Adherent Slough. General Notes: periwound redness. 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 There is still nonviable tissue and I attempted to debride this today. I think she would benefit more from daily dressing changes with Santyl to see if we can completely remove this nonviable tissue. No signs of infection on exam. Follow-up in 2 weeks Procedures Wound #1 Pre-procedure diagnosis of Wound #1 is a Pressure Ulcer located on the Right,Lateral Malleolus . There was a Excisional Skin/Subcutaneous Tissue Debridement with a total area of 0.12 sq cm performed by Geralyn CorwinHoffman, Canyon Lohr, DO. With the  following instrument(s): Curette to remove Viable and Non-Viable tissue/material. Material removed includes Subcutaneous Tissue and Slough and after achieving pain control using Other (benzocaine 20% spray). No specimens were taken. A time out was conducted at 09:45, prior to the start of the procedure. A Minimum amount of  bleeding was controlled with Pressure. The procedure was tolerated well with a pain level of 5 throughout and a pain level of 1 following the procedure. Post Debridement Measurements: 0.3cm length x 0.4cm width x 0.1cm depth; 0.009cm^3 volume. Post debridement Stage noted as Category/Stage III. Character of Wound/Ulcer Post Debridement is improved. Post procedure Diagnosis Wound #1: Same as Pre-Procedure Plan Follow-up Appointments: Return Appointment in 2 weeks. - with Dr. Mikey Bussing Bathing/ Shower/ Hygiene: May shower and wash wound with soap and water. Edema Control - Lymphedema / SCD / Other: Elevate legs to the level of the heart or above for 30 minutes daily and/or when sitting, a frequency of: - throughout the day Avoid standing for long periods of time. Exercise regularly Moisturize legs daily. Additional Orders / Instructions: Follow Nutritious Diet The following medication(s) was prescribed: Santyl topical 250 unit/gram ointment 1 1 gm ointment topical to wound daily starting 07/11/2021 WOUND #1: - Malleolus Wound Laterality: Right, Lateral Cleanser: Soap and Water 1 x Per Day/30 Days Discharge Instructions: May shower and wash wound with dial antibacterial soap and water prior to dressing change. Cleanser: Wound Cleanser 1 x Per Day/30 Days Discharge Instructions: Cleanse the wound with wound cleanser prior to applying a clean dressing using gauze sponges, not tissue or cotton balls. Prim Dressing: Santyl Ointment 1 x Per Day/30 Days ary Discharge Instructions: Apply nickel thick amount to wound bed, under Hydrofera Secondary Dressing: ComfortFoam Border, 3x3 in (silicone border) (DME) (Generic) 1 x Per Day/30 Days Discharge Instructions: Apply over primary dressing as directed. 1. In office sharp debridement 2. Santyl daily 3. Follow-up in 2 weeks Electronic Signature(s) Signed: 07/11/2021 10:34:18 AM By: Geralyn Corwin DO Entered By: Geralyn Corwin on 07/11/2021  10:33:42 -------------------------------------------------------------------------------- HxROS Details Patient Name: Date of Service: [name] Baird, [name] 07/11/2021 8:30 A M Medical Record Number: [id number] Patient Account Number: [id number] Date of Birth/Sex: Treating RN: 1933/01/09 (87 y.o. Tommye Standard Primary Care Provider: Other Clinician: Bjorn Pippin Referring Provider: Treating Provider/Extender: Kathyrn Drown in Treatment: 6 Information Obtained From Patient Eyes Medical History: Positive for: Glaucoma Negative for: Cataracts; Optic Neuritis Ear/Nose/Mouth/Throat Medical History: Negative for: Chronic sinus problems/congestion; Middle ear problems Hematologic/Lymphatic Medical History: Negative for: Anemia; Hemophilia; Human Immunodeficiency Virus; Lymphedema; Sickle Cell Disease Respiratory Medical History: Negative for: Aspiration; Asthma; Chronic Obstructive Pulmonary Disease (COPD); Pneumothorax; Sleep Apnea; Tuberculosis Cardiovascular Medical History: Positive for: Arrhythmia - atrial flutter; Congestive Heart Failure; Hypertension Negative for: Angina; Coronary Artery Disease; Deep Vein Thrombosis; Hypotension; Myocardial Infarction; Peripheral Arterial Disease; Peripheral Venous Disease; Phlebitis; Vasculitis Past Medical History Notes: complete heart block s/p medtronic pacemaker status post patch cllosure of ASD Gastrointestinal Medical History: Negative for: Cirrhosis ; Colitis; Crohns; Hepatitis A; Hepatitis B; Hepatitis C Endocrine Medical History: Negative for: Type I Diabetes; Type II Diabetes Genitourinary Medical History: Negative for: End Stage Renal Disease Immunological Medical History: Negative for: Lupus Erythematosus; Raynauds; Scleroderma Integumentary (Skin) Medical History: Negative for: History of Burn Musculoskeletal Medical History: Negative for: Gout; Rheumatoid Arthritis; Osteoarthritis;  Osteomyelitis Neurologic Medical History: Negative for: Dementia; Neuropathy; Quadriplegia; Paraplegia; Seizure Disorder Oncologic Medical History: Negative for: Received Chemotherapy; Received Radiation Psychiatric Medical History: Negative for: Anorexia/bulimia; Confinement Anxiety Past Medical History Notes: anxiety  and depression HBO Extended History Items Eyes: Glaucoma Immunizations Pneumococcal Vaccine: Received Pneumococcal Vaccination: Yes Implantable Devices Yes Family and Social History Unknown History: Yes; Never smoker; Marital Status - Married; Alcohol Use: Never; Drug Use: No History; Caffeine Use: Rarely; Financial Concerns: No; Food, Clothing or Shelter Needs: No; Support System Lacking: No; Transportation Concerns: No Electronic Signature(s) Signed: 07/11/2021 10:34:18 AM By: Geralyn Corwin DO Signed: 07/11/2021 1:37:28 PM By: Zenaida Deed RN, BSN Entered By: Geralyn Corwin on 07/11/2021 10:31:05 -------------------------------------------------------------------------------- SuperBill Details Patient Name: Date of Service: [name] Peon S. 07/11/2021 Medical Record Number: [id number] Patient Account Number: [id number] Date of Birth/Sex: Treating RN: Jun 29, 1933 (87 y.o. 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

## 2021-07-11 NOTE — Progress Notes (Addendum)
[name] Baird, [name] Baird ([id number]) Visit Report for 07/11/2021 Arrival Information Details Patient Name: Date of Service: [name] Baird, [name] Baird [name] 07/11/2021 8:30 A M Medical Record Number: [id number] Patient Account Number: [id number] Date of Birth/Sex: Treating RN: 1933/11/18 (87 y.o. Tommye Standard Primary Care Guerline Happ: Bjorn Pippin Other Clinician: Referring Izamar Linden: Treating Franceska Strahm/Extender: Kathyrn Drown in Treatment: 6 Visit Information History Since Last Visit Added or deleted any medications: No Patient Arrived: Wheel Chair Any [name] allergies or adverse reactions: No Arrival Time: 08:47 Had a fall or experienced change in No Accompanied By: self activities of daily living that may affect Transfer Assistance: None risk of falls: Patient Identification Verified: Yes Signs or symptoms of abuse/neglect since last visito No Secondary Verification Process Completed: Yes Hospitalized since last visit: No Patient Requires Transmission-Based Precautions: No Implantable device outside of the clinic excluding No Patient Has Alerts: Yes cellular tissue based products placed in the center Patient Alerts: R ABI=1.03 TBI=.42 since last visit: L ABI=.97 TBI=.61 Has Dressing in Place as Prescribed: Yes Pain Present Now: No Electronic Signature(s) Signed: 07/24/2021 5:39:10 PM By: Zenaida Deed RN, BSN Previous Signature: 07/11/2021 10:49:45 AM Version By: Karl Ito Entered By: Zenaida Deed on 07/24/2021 09:22:21 -------------------------------------------------------------------------------- Encounter Discharge Information Details Patient Name: Date of Service: [name] Lango NA S. 07/11/2021 8:30 A M Medical Record Number: [id number] Patient Account Number: [id number] Date of Birth/Sex: Treating RN: 10-30-1933 (87 y.o. [name] Primary Care Yanina Knupp: Bjorn Pippin Other Clinician: Referring Chapin Arduini: Treating Kofi Murrell/Extender: Kathyrn Drown in Treatment: 6 Encounter Discharge Information Items Post Procedure Vitals Discharge Condition: Stable Temperature (F): 97.7 Ambulatory Status: Wheelchair Pulse (bpm): 71 Discharge Destination: Home Respiratory Rate (breaths/min): 18 Transportation: Private Auto Blood Pressure (mmHg): 137/80 Schedule Follow-up Appointment: Yes Clinical Summary of Care: Provided on 07/11/2021 Form Type Recipient Paper Patient Patient Electronic Signature(s) Signed: 07/11/2021 1:17:45 PM By: [name] Entered By: [name] on 07/11/2021 10:09:12 -------------------------------------------------------------------------------- Lower Extremity Assessment Details Patient Name: Date of Service: [name] Peon S. 07/11/2021 8:30 A M Medical Record Number: [id number] Patient Account Number: [id number] Date of Birth/Sex: Treating RN: [birth year] (87 y.o. Debara Pickett, Yvonne Kendall Primary Care Everline Mahaffy: Bjorn Pippin Other Clinician: Referring Dabid Godown: Treating Leasha Goldberger/Extender: Kathyrn Drown in Treatment: 6 Edema Assessment Assessed: Kyra Searles: No] Franne Forts: Yes] Edema: [Left: Ye] [Right: s] Calf Left: Right: Point of Measurement: 27 cm From Medial Instep 29 cm Ankle Left: Right: Point of Measurement: 10 cm From Medial Instep 19 cm Vascular Assessment Pulses: Dorsalis Pedis Palpable: [Right:Yes] Electronic Signature(s) Signed: 07/11/2021 2:18:48 PM By: Shawn Stall Entered By: Shawn Stall on 07/11/2021 09:08:22 -------------------------------------------------------------------------------- Multi Wound Chart Details Patient Name: Date of Service: [name] Lango NA S. 07/11/2021 8:30 A M Medical Record Number: [id number] Patient Account Number: [id number] Date of Birth/Sex: Treating RN: [birth year] (87 y.o. Tommye Standard Primary Care Myrical Andujo: Bjorn Pippin Other Clinician: Referring Yash Cacciola: Treating Ivor Kishi/Extender: Kathyrn Drown in  Treatment: 6 Vital Signs Height(in): Pulse(bpm): 71 Weight(lbs): Blood Pressure(mmHg): 137/80 Body Mass Index(BMI): Temperature(F): 97.7 Respiratory Rate(breaths/min): 18 Photos: [1:Right, Lateral Malleolus] [N/A:N/A N/A] Wound Location: [1:Trauma] [N/A:N/A] Wounding Event: [1:Venous Leg Ulcer] [N/A:N/A] Primary Etiology: [1:Glaucoma, Arrhythmia, Congestive N/A] Comorbid History: [1:Heart Failure, Hypertension 01/21/2021] [N/A:N/A] Date Acquired: [1:6] [N/A:N/A] Weeks of Treatment: [1:Open] [N/A:N/A] Wound Status: [1:0.3x0.4x0.1] [N/A:N/A] Measurements L x W x D (cm) [1:0.094] [N/A:N/A] A (cm) : rea [1:0.009] [N/A:N/A] Volume (cm) : [1:-100.00%] [N/A:N/A] % Reduction in A [1:rea: 0.00%] [N/A:N/A] % Reduction in Volume: [1:Full Thickness Without Exposed] [  N/A:N/A] Classification: [1:Support Structures Medium] [N/A:N/A] Exudate A mount: [1:Serosanguineous] [N/A:N/A] Exudate Type: [1:red, brown] [N/A:N/A] Exudate Color: [1:Distinct, outline attached] [N/A:N/A] Wound Margin: [1:Medium (34-66%)] [N/A:N/A] Granulation A mount: [1:Pink, Pale] [N/A:N/A] Granulation Quality: [1:Medium (34-66%)] [N/A:N/A] Necrotic A mount: [1:Fat Layer (Subcutaneous Tissue): Yes N/A] Exposed Structures: [1:Fascia: No Tendon: No Muscle: No Joint: No Bone: No Small (1-33%)] [N/A:N/A] Epithelialization: [1:Debridement - Excisional] [N/A:N/A] Debridement: Pre-procedure Verification/Time Out 09:45 [N/A:N/A] Taken: [1:Other] [N/A:N/A] Pain Control: [1:Subcutaneous, Slough] [N/A:N/A] Tissue Debrided: [1:Skin/Subcutaneous Tissue] [N/A:N/A] Level: [1:0.12] [N/A:N/A] Debridement A (sq cm): [1:rea Curette, N/A] [N/A:N/A] Instrument: [1:Minimum] [N/A:N/A] Bleeding: [1:Pressure] [N/A:N/A] Hemostasis A chieved: [1:5] [N/A:N/A] Procedural Pain: [1:1] [N/A:N/A] Post Procedural Pain: [1:Procedure was tolerated well] [N/A:N/A] Debridement Treatment Response: [1:0.3x0.4x0.1] [N/A:N/A] Post Debridement  Measurements L x W x D (cm) [1:0.009] [N/A:N/A] Post Debridement Volume: (cm) [1:periwound redness.] [N/A:N/A] Assessment Notes: [1:Debridement] [N/A:N/A] Treatment Notes Wound #1 (Malleolus) Wound Laterality: Right, Lateral Cleanser Soap and Water Discharge Instruction: May shower and wash wound with dial antibacterial soap and water prior to dressing change. Wound Cleanser Discharge Instruction: Cleanse the wound with wound cleanser prior to applying a clean dressing using gauze sponges, not tissue or cotton balls. Peri-Wound Care Topical Primary Dressing Santyl Ointment Discharge Instruction: Apply nickel thick amount to wound bed, under Hydrofera Secondary Dressing ComfortFoam Border, 3x3 in (silicone border) Discharge Instruction: Apply over primary dressing as directed. Secured With Compression Wrap Compression Stockings Facilities manager) Signed: 07/11/2021 10:34:18 AM By: Geralyn Corwin DO Signed: 07/11/2021 1:37:28 PM By: Zenaida Deed RN, BSN Entered By: Geralyn Corwin on 07/11/2021 10:30:08 -------------------------------------------------------------------------------- Multi-Disciplinary Care Plan Details Patient Name: Date of Service: [name] Baird, [name] [place] S. 07/11/2021 8:30 A M Medical Record Number: [id number] Patient Account Number: [id number] Date of Birth/Sex: Treating RN: 1933-07-05 (87 y.o. Tommye Standard Primary Care Kannon Granderson: Bjorn Pippin Other Clinician: Referring York Valliant: Treating Quincey Nored/Extender: Kathyrn Drown in Treatment: 6 Active Inactive Wound/Skin Impairment Nursing Diagnoses: Impaired tissue integrity Goals: Patient/caregiver will verbalize understanding of skin care regimen Date Initiated: 05/30/2021 Target Resolution Date: 07/25/2021 Goal Status: Active Ulcer/skin breakdown will have a volume reduction of 30% by week 4 Date Initiated: 05/30/2021 Date Inactivated: 06/27/2021 Target Resolution Date:  06/27/2021 Goal Status: Unmet Unmet Reason: non viable tissue Ulcer/skin breakdown will have a volume reduction of 50% by week 8 Date Initiated: 07/11/2021 Target Resolution Date: 07/25/2021 Goal Status: Active Interventions: Assess patient/caregiver ability to obtain necessary supplies Assess patient/caregiver ability to perform ulcer/skin care regimen upon admission and as needed Assess ulceration(s) every visit Provide education on ulcer and skin care Treatment Activities: Skin care regimen initiated : 05/30/2021 Topical wound management initiated : 05/30/2021 Notes: Electronic Signature(s) Signed: 07/11/2021 1:37:28 PM By: Zenaida Deed RN, BSN Entered By: Zenaida Deed on 07/11/2021 09:45:48 -------------------------------------------------------------------------------- Pain Assessment Details Patient Name: Date of Service: [name] Lango NA S. 07/11/2021 8:30 A M Medical Record Number: [id number] Patient Account Number: [id number] Date of Birth/Sex: Treating RN: [birth year] (87 y.o. Tommye Standard Primary Care Haydan Mansouri: Bjorn Pippin Other Clinician: Referring Chester Sibert: Treating Carlicia Leavens/Extender: Kathyrn Drown in Treatment: 6 Active Problems Location of Pain Severity and Description of Pain Patient Has Paino No Site Locations Pain Management and Medication Current Pain Management: Electronic Signature(s) Signed: 07/11/2021 10:49:45 AM By: Karl Ito Signed: 07/11/2021 1:37:28 PM By: Zenaida Deed RN, BSN Entered By: Karl Ito on 07/11/2021 08:48:27 -------------------------------------------------------------------------------- Patient/Caregiver Education Details Patient Name: Date of Service: [name] Baird 7/22/2022andnbsp8:30 A M Medical Record Number: [id number] Patient Account Number: [id number] Date of  Birth/Gender: Treating RN: 12/21/1933 (87 y.o. Tommye Standard Primary Care Physician: Bjorn Pippin Other  Clinician: Referring Physician: Treating Physician/Extender: Kathyrn Drown in Treatment: 6 Education Assessment Education Provided To: Patient Education Topics Provided Wound/Skin Impairment: Methods: Explain/Verbal Responses: Reinforcements needed, State content correctly Electronic Signature(s) Signed: 07/11/2021 1:37:28 PM By: Zenaida Deed RN, BSN Entered By: Zenaida Deed on 07/11/2021 09:46:04 -------------------------------------------------------------------------------- Wound Assessment Details Patient Name: Date of Service: [name] Peon S. 07/11/2021 8:30 A M Medical Record Number: [id number] Patient Account Number: [id number] Date of Birth/Sex: Treating RN: [birth year] (87 y.o. [name] Baird, [name] Baird Primary Care Analiza Cowger: Bjorn Pippin Other Clinician: Referring Delecia Vastine: Treating Esker Dever/Extender: Kathyrn Drown in Treatment: 6 Wound Status Wound Number: 1 Primary Etiology: Venous Leg Ulcer Wound Location: Right, Lateral Malleolus Wound Status: Open Wounding Event: Trauma Comorbid Glaucoma, Arrhythmia, Congestive Heart Failure, History: Hypertension Date Acquired: 01/21/2021 Weeks Of Treatment: 6 Clustered Wound: No Photos Wound Measurements Length: (cm) 0.3 Width: (cm) 0.4 Depth: (cm) 0.1 Area: (cm) 0.094 Volume: (cm) 0.009 % Reduction in Area: -100% % Reduction in Volume: 0% Epithelialization: Small (1-33%) Tunneling: No Undermining: No Wound Description Classification: Full Thickness Without Exposed Support Structures Wound Margin: Distinct, outline attached Exudate Amount: Medium Exudate Type: Serosanguineous Exudate Color: red, brown Foul Odor After Cleansing: No Slough/Fibrino Yes Wound Bed Granulation Amount: Medium (34-66%) Exposed Structure Granulation Quality: Pink, Pale Fascia Exposed: No Necrotic Amount: Medium (34-66%) Fat Layer (Subcutaneous Tissue) Exposed: Yes Necrotic Quality:  Adherent Slough Tendon Exposed: No Muscle Exposed: No Joint Exposed: No Bone Exposed: No Assessment Notes periwound redness. Treatment Notes Wound #1 (Malleolus) Wound Laterality: Right, Lateral Cleanser Soap and Water Discharge Instruction: May shower and wash wound with dial antibacterial soap and water prior to dressing change. Wound Cleanser Discharge Instruction: Cleanse the wound with wound cleanser prior to applying a clean dressing using gauze sponges, not tissue or cotton balls. Peri-Wound Care Topical Primary Dressing Santyl Ointment Discharge Instruction: Apply nickel thick amount to wound bed, under Hydrofera Secondary Dressing ComfortFoam Border, 3x3 in (silicone border) Discharge Instruction: Apply over primary dressing as directed. Secured With Compression Wrap Compression Stockings Facilities manager) Signed: 07/11/2021 1:37:28 PM By: Zenaida Deed RN, BSN Entered By: Zenaida Deed on 07/11/2021 09:55:22 -------------------------------------------------------------------------------- Vitals Details Patient Name: Date of Service: [name] Baird, [name] NA S. 07/11/2021 8:30 A M Medical Record Number: [id number] Patient Account Number: [id number] Date of Birth/Sex: Treating RN: Dec 04, 1933 (87 y.o. Tommye Standard Primary Care Lulabelle Desta: Bjorn Pippin Other Clinician: Referring Toria Monte: Treating Koury Roddy/Extender: Kathyrn Drown in Treatment: 6 Vital Signs Time Taken: 08:47 Temperature (F): 97.7 Pulse (bpm): 71 Respiratory Rate (breaths/min): 18 Blood Pressure (mmHg): 137/80 Reference Range: 80 - 120 mg / dl Electronic Signature(s) Signed: 07/11/2021 10:49:45 AM By: Karl Ito Entered By: Karl Ito on 07/11/2021 08:48:17

## 2021-07-25 ENCOUNTER — Encounter (HOSPITAL_BASED_OUTPATIENT_CLINIC_OR_DEPARTMENT_OTHER): Payer: Medicare Other | Attending: Internal Medicine | Admitting: Internal Medicine

## 2021-07-25 ENCOUNTER — Other Ambulatory Visit: Payer: Self-pay

## 2021-07-25 DIAGNOSIS — I872 Venous insufficiency (chronic) (peripheral): Secondary | ICD-10-CM | POA: Diagnosis not present

## 2021-07-25 DIAGNOSIS — L97319 Non-pressure chronic ulcer of right ankle with unspecified severity: Secondary | ICD-10-CM

## 2021-07-25 DIAGNOSIS — I480 Paroxysmal atrial fibrillation: Secondary | ICD-10-CM | POA: Diagnosis not present

## 2021-07-25 DIAGNOSIS — I370 Nonrheumatic pulmonary valve stenosis: Secondary | ICD-10-CM | POA: Insufficient documentation

## 2021-07-25 DIAGNOSIS — Z7901 Long term (current) use of anticoagulants: Secondary | ICD-10-CM | POA: Insufficient documentation

## 2021-07-25 DIAGNOSIS — I509 Heart failure, unspecified: Secondary | ICD-10-CM | POA: Insufficient documentation

## 2021-07-25 DIAGNOSIS — I110 Hypertensive heart disease with heart failure: Secondary | ICD-10-CM | POA: Diagnosis not present

## 2021-07-25 NOTE — Progress Notes (Signed)
[name], Baird ([id number]) Visit Report for 07/25/2021 Chief Complaint Document Details Patient Name: Date of Service: [place], [name] 07/25/2021 8:30 A M Medical Record Number: [id number] Patient Account Number: [id number] Date of Birth/Sex: Treating RN: 17-May-1933 (87 y.o. [name] Baird Primary Care Provider: Bjorn Pippin Other Clinician: Referring Provider: Treating Provider/Extender: Kathyrn Drown in Treatment: 8 Information Obtained from: Patient Chief Complaint Right ankle wound Electronic Signature(s) Signed: 07/25/2021 9:44:03 AM By: Geralyn Corwin DO Entered By: Geralyn Corwin on 07/25/2021 09:41:18 -------------------------------------------------------------------------------- Debridement Details Patient Name: Date of Service: [name] Baird NA S. 07/25/2021 8:30 A M Medical Record Number: [id number] Patient Account Number: [id number] Date of Birth/Sex: Treating RN: 1933-02-27 (87 y.o. [name] Baird Primary Care Provider: Bjorn Pippin Other Clinician: Referring Provider: Treating Provider/Extender: Kathyrn Drown in Treatment: 8 Debridement Performed for Assessment: Wound #1 Right,Lateral Malleolus Performed By: Physician Geralyn Corwin, DO Debridement Type: Debridement Severity of Tissue Pre Debridement: Fat layer exposed Level of Consciousness (Pre-procedure): Awake and Alert Pre-procedure Verification/Time Out Yes - 09:07 Taken: Start Time: 09:08 Pain Control: Other : Benzocaine T Area Debrided (L x W): otal 0.2 (cm) x 0.3 (cm) = 0.06 (cm) Tissue and other material debrided: Subcutaneous Level: Skin/Subcutaneous Tissue Debridement Description: Excisional Instrument: Curette Bleeding: Minimum Hemostasis Achieved: Pressure End Time: 09:11 Response to Treatment: Procedure was tolerated well Level of Consciousness (Post- Awake and Alert procedure): Post Debridement Measurements of Total Wound Length: (cm)  0.2 Width: (cm) 0.3 Depth: (cm) 0.1 Volume: (cm) 0.005 Character of Wound/Ulcer Post Debridement: Stable Severity of Tissue Post Debridement: Fat layer exposed Post Procedure Diagnosis Same as Pre-procedure Electronic Signature(s) Signed: 07/25/2021 9:44:03 AM By: Geralyn Corwin DO Signed: 07/25/2021 1:36:18 PM By: [name] Entered By: [name] on 07/25/2021 09:12:46 -------------------------------------------------------------------------------- HPI Details Patient Name: Date of Service: [name] [place], [name] 07/25/2021 8:30 A M Medical Record Number: [id number] Patient Account Number: [id number] Date of Birth/Sex: Treating RN: 1933/10/28 (87 y.o. [name] Baird Primary Care Provider: Bjorn Pippin Other Clinician: Referring Provider: Treating Provider/Extender: Kathyrn Drown in Treatment: 8 History of Present Illness HPI Description: Admission 6/10 [name] is an 87 year old female with a past medical history of paroxysmal A. fib on anticoagulation, congenital heart disease (atrial septal defect status post patch closure), pulmonic stenosis (status post valvotomy 1985), left pulmonary artery aneurysm, complete heart block status post dual-chamber permanent pacemaker implantation that presents to the clinic for a right lateral malleolus wound that developed 3 months ago. She is not quite sure how it started but thinks she may have hit it against her walker. She has been placing Neosporin on this wound daily. She reports pain to the area worse at night and often finds herself laying her leg over the side of the bed. She has been given doxycycline for this issue by her podiatrist. She currently denies signs of infection. She reports trying to offload the area the best she can daily. 6/24; patient presents for 2-week follow-up. She has been using collagen and activating it with K-Y jelly. She has tolerated this well. She continues to have nocturnal pain  at the wound site. She is scheduled to see vein and vascular on 7/6. She denies signs of infection. 7/8; patient presents for 2-week follow-up. She has been using collagen to the wound. She has no issues or complaints today. She did see Dr. Edilia Bo with vein and vascular and was told she had adequate blood flow for wound healing. She denies signs  of infection. 7/15; patient presents for 1 week follow-up. She has tolerated the compression wrap. We changed the dressing last week to Santyl and Hydrofera Blue. She has no issues or complaints today. She denies signs of infection. 7/22; patient presents for 1 week follow-up. She continues to tolerate the compression wrap well. We were using Hydrofera Blue and Santyl under this. She has no issues or complaints today. She thinks the wound has improved slightly over the past week. She denies signs of infection. 8/5; patient presents for follow-up. She has been using Santyl to the wound bed daily. She has noticed improvement in nocturnal pain over the past week. She denies signs of infection. Electronic Signature(s) Signed: 07/25/2021 9:44:03 AM By: Geralyn CorwinHoffman, Josue Kass DO Entered By: Geralyn CorwinHoffman, Lashante Fryberger on 07/25/2021 09:41:55 -------------------------------------------------------------------------------- Physical Exam Details Patient Name: Date of Service: [name] [place], [name] 07/25/2021 8:30 A M Medical Record Number: [id number] Patient Account Number: [id number] Date of Birth/Sex: Treating RN: 28-Mar-1933 (87 y.o. [name] CluckF) Barnhart, Jodi Primary Care Provider: Bjorn PippinGray, Joseph Other Clinician: Referring Provider: Treating Provider/Extender: Kathyrn DrownHoffman, Beckett Hickmon Gray, Joseph Weeks in Treatment: 8 Constitutional respirations regular, non-labored and within target range for patient.. Cardiovascular 2+ dorsalis pedis/posterior tibialis pulses. Psychiatric pleasant and cooperative. Notes Right ankle: Lateral malleolus with a small open wound with nonviable tissue in the center.  No increased warmth or erythema or drainage noted. No signs of infection. Electronic Signature(s) Signed: 07/25/2021 9:44:03 AM By: Geralyn CorwinHoffman, Lorik Guo DO Entered By: Geralyn CorwinHoffman, Dwayne Bulkley on 07/25/2021 09:42:24 -------------------------------------------------------------------------------- Physician Orders Details Patient Name: Date of Service: [name] [place], [name] 07/25/2021 8:30 A M Medical Record Number: [id number] Patient Account Number: [id number] Date of Birth/Sex: Treating RN: 28-Mar-1933 (87 y.o. 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 - with Dr. Mikey BussingHoffman Return A Bathing/ Shower/ Hygiene May shower and wash wound with soap and water. Edema Control - Lymphedema / SCD / Other Elevate legs to the level of the heart or above for 30 minutes daily and/or when sitting, a frequency of: - throughout the day Avoid standing for long periods of time. Exercise regularly Moisturize legs daily. Additional Orders / Instructions Follow Nutritious Diet Wound Treatment Wound #1 - Malleolus Wound Laterality: Right, Lateral Cleanser: Soap and Water 1 x Per Day/30 Days Discharge Instructions: May shower and wash wound with dial antibacterial soap and water prior to dressing change. Cleanser: Wound Cleanser 1 x Per Day/30 Days Discharge Instructions: Cleanse the wound with wound cleanser prior to applying a clean dressing using gauze sponges, not tissue or cotton balls. Peri-Wound  Care: Skin Prep 1 x Per Day/30 Days Discharge Instructions: Use skin prep as directed Prim Dressing: Santyl Ointment 1 x Per Day/30 Days ary Discharge Instructions: Apply nickel thick amount to wound bed, under Hydrofera Secondary Dressing: Zetuvit Plus Silicone Border Dressing 5x5 (in/in) 1 x Per Day/30 Days Discharge Instructions: Apply silicone border over primary dressing as directed. Electronic Signature(s) Signed: 07/25/2021 9:44:03 AM By: Geralyn CorwinHoffman, Kaito Schulenburg DO Entered By: Geralyn CorwinHoffman, Nathanyel Defenbaugh on 07/25/2021 09:42:34 -------------------------------------------------------------------------------- Problem List Details Patient Name: Date of Service: [name] [place], [name] 07/25/2021 8:30 A M Medical Record Number: [id number] Patient Account Number:  [id number] Date of Birth/Sex: Treating RN: Jan 02, 1933 (87 y.o. 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 05/30/2021 No Yes I48.0 Paroxysmal atrial fibrillation 05/30/2021 No Yes Z79.01 Long term (current) use of anticoagulants 05/30/2021 No Yes Z95.0 Presence of cardiac pacemaker 05/30/2021 No Yes M79.606 Pain in leg, unspecified 05/30/2021 No Yes I87.2 Venous insufficiency (chronic) (peripheral) 07/11/2021 No Yes Inactive Problems Resolved Problems Electronic Signature(s) Signed: 07/25/2021 9:44:03 AM By: Geralyn Corwin DO Entered By: Geralyn Corwin on 07/25/2021 09:41:04 -------------------------------------------------------------------------------- Progress Note Details Patient Name: Date of Service: [name] [place], [name] 07/25/2021 8:30 A M Medical Record Number: [id number] Patient Account Number: [id number] Date of Birth/Sex: Treating RN: [birth year] (87 y.o. [name] Baird Primary Care Provider: Bjorn Pippin Other Clinician: Referring Provider: Treating Provider/Extender: Kathyrn Drown in Treatment: 8 Subjective Chief Complaint Information obtained from Patient Right ankle wound History of Present Illness (HPI) Admission 6/10 Ms. Vayda Dungee is an 87 year old female with a past medical history of paroxysmal A. fib on anticoagulation, congenital heart disease (atrial septal defect status post patch closure), pulmonic stenosis (status post valvotomy 1985), left pulmonary artery aneurysm, complete heart block status post dual-chamber permanent pacemaker implantation that presents to the clinic for a right lateral malleolus wound that developed 3 months ago. She is not quite sure how it started but thinks she may have hit it against her walker. She has been placing Neosporin on this wound daily. She reports pain to the area worse at night and often finds herself laying her leg over the side of the bed. She has been given doxycycline for this issue by her podiatrist. She currently denies signs of infection. She reports trying to offload the area the best she can daily. 6/24; patient presents for 2-week follow-up. She has been using collagen and activating it with K-Y jelly. She has tolerated this well. She continues to have nocturnal pain at the wound site. She is scheduled to see vein and vascular on 7/6. She denies signs of infection. 7/8; patient presents for 2-week follow-up. She has been using collagen to the wound. She has no issues or complaints today. She did see Dr. Edilia Bo with vein and vascular and was told she had adequate blood flow for wound healing. She denies signs of infection. 7/15; patient presents for 1 week follow-up. She has tolerated the compression wrap. We changed the dressing last week to Santyl and Hydrofera Blue. She has no issues or complaints today. She denies signs of infection. 7/22; patient  presents for 1 week follow-up. She continues to tolerate the compression wrap well. We were using Hydrofera Blue and Santyl under this. She has no issues or complaints today. She thinks the wound has improved slightly over the past week. She denies signs of infection. 8/5; patient presents for follow-up. She has been using Santyl to the wound bed daily. She has noticed improvement in nocturnal pain over the past week. She denies signs of infection. Patient History Information obtained from Patient. Family History Unknown History. Social History Never smoker, Marital Status - Married, Alcohol Use - Never, Drug Use - No History, Caffeine Use - Rarely. Medical History Eyes Patient has history of Glaucoma Denies history of Cataracts, Optic Neuritis Ear/Nose/Mouth/Throat Denies history of Chronic sinus problems/congestion, Middle ear problems Hematologic/Lymphatic Denies history of Anemia, Hemophilia, Human  Immunodeficiency Virus, Lymphedema, Sickle Cell Disease Respiratory Denies history of Aspiration, Asthma, Chronic Obstructive Pulmonary Disease (COPD), Pneumothorax, Sleep Apnea, Tuberculosis Cardiovascular Patient has history of Arrhythmia - atrial flutter, Congestive Heart Failure, Hypertension Denies history of Angina, Coronary Artery Disease, Deep Vein Thrombosis, Hypotension, Myocardial Infarction, Peripheral Arterial Disease, Peripheral Venous Disease, Phlebitis, Vasculitis Gastrointestinal Denies history of Cirrhosis , Colitis, Crohnoos, Hepatitis A, Hepatitis B, Hepatitis C Endocrine Denies history of Type I Diabetes, Type II Diabetes Genitourinary Denies history of End Stage Renal Disease Immunological Denies history of Lupus Erythematosus, Raynaudoos, Scleroderma Integumentary (Skin) Denies history of History of Burn Musculoskeletal Denies history of Gout, Rheumatoid Arthritis, Osteoarthritis, Osteomyelitis Neurologic Denies history of Dementia, Neuropathy,  Quadriplegia, Paraplegia, Seizure Disorder Oncologic Denies history of Received Chemotherapy, Received Radiation Psychiatric Denies history of Anorexia/bulimia, Confinement Anxiety Medical A Surgical History Notes nd Cardiovascular complete heart block s/p medtronic pacemaker status post patch cllosure of ASD Psychiatric anxiety and depression Objective Constitutional respirations regular, non-labored and within target range for patient.. Vitals Time Taken: 8:34 AM, Temperature: 98.0 F, Pulse: 78 bpm, Respiratory Rate: 16 breaths/min, Blood Pressure: 123/79 mmHg. Cardiovascular 2+ dorsalis pedis/posterior tibialis pulses. Psychiatric pleasant and cooperative. General Notes: Right ankle: Lateral malleolus with a small open wound with nonviable tissue in the center. No increased warmth or erythema or drainage noted. No signs of infection. Integumentary (Hair, Skin) Wound #1 status is Open. Original cause of wound was Trauma. The date acquired was: 01/21/2021. The wound has been in treatment 8 weeks. The wound is located on the Right,Lateral Malleolus. The wound measures 0.2cm length x 0.3cm width x 0.1cm depth; 0.047cm^2 area and 0.005cm^3 volume. There is Fat Layer (Subcutaneous Tissue) exposed. There is no tunneling or undermining noted. There is a medium amount of serosanguineous drainage noted. The wound margin is distinct with the outline attached to the wound base. There is small (1-33%) pink granulation within the wound bed. There is a large (67-100%) amount of necrotic tissue within the wound bed including Adherent Slough. 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 She does report improvement in pain at night to the  ankle. I debrided nonviable tissue. I recommended continuing Santyl. Procedures Wound #1 Pre-procedure diagnosis of Wound #1 is a Venous Leg Ulcer located on the Right,Lateral Malleolus .Severity of Tissue Pre Debridement is: Fat layer exposed. There was a Excisional Skin/Subcutaneous Tissue Debridement with a total area of 0.06 sq cm performed by Geralyn Corwin, DO. With the following instrument(s): Curette Material removed includes Subcutaneous Tissue after achieving pain control using Other (Benzocaine). No specimens were taken. A time out was conducted at 09:07, prior to the start of the procedure. A Minimum amount of bleeding was controlled with Pressure. The procedure was tolerated well. Post Debridement Measurements: 0.2cm length x 0.3cm width x 0.1cm depth; 0.005cm^3 volume. Character of Wound/Ulcer Post Debridement is stable. Severity of Tissue Post Debridement is: Fat layer exposed. Post procedure Diagnosis Wound #1: Same as Pre-Procedure Plan Follow-up Appointments: Return Appointment in 2 weeks. - with Dr. Mikey Bussing Bathing/ Shower/ Hygiene: May shower and wash wound with soap and water. Edema Control - Lymphedema / SCD / Other: Elevate legs to the level of the heart or above for 30 minutes daily and/or when sitting, a frequency of: - throughout the day Avoid standing for long periods of time. Exercise regularly Moisturize  legs daily. Additional Orders / Instructions: Follow Nutritious Diet WOUND #1: - Malleolus Wound Laterality: Right, Lateral Cleanser: Soap and Water 1 x Per Day/30 Days Discharge Instructions: May shower and wash wound with dial antibacterial soap and water prior to dressing change. Cleanser: Wound Cleanser 1 x Per Day/30 Days Discharge Instructions: Cleanse the wound with wound cleanser prior to applying a clean dressing using gauze sponges, not tissue or cotton balls. Peri-Wound Care: Skin Prep 1 x Per Day/30 Days Discharge Instructions: Use skin prep as  directed Prim Dressing: Santyl Ointment 1 x Per Day/30 Days ary Discharge Instructions: Apply nickel thick amount to wound bed, under Hydrofera Secondary Dressing: Zetuvit Plus Silicone Border Dressing 5x5 (in/in) 1 x Per Day/30 Days Discharge Instructions: Apply silicone border over primary dressing as directed. 1. Continue Santyl 2. In office sharp debridement 3. Follow-up in 2 weeks Electronic Signature(s) Signed: 07/25/2021 9:44:03 AM By: Geralyn Corwin DO Entered By: Geralyn Corwin on 07/25/2021 09:43:31 -------------------------------------------------------------------------------- HxROS Details Patient Name: Date of Service: [name] Baird, [name] 07/25/2021 8:30 A M Medical Record Number: [id number] Patient Account Number: [id number] Date of Birth/Sex: Treating RN: 1933/11/07 (87 y.o. [name] Baird Primary Care Provider: Bjorn Pippin Other Clinician: Referring Provider: Treating Provider/Extender: Kathyrn Drown in Treatment: 8 Information Obtained From Patient Eyes Medical History: Positive for: Glaucoma Negative for: Cataracts; Optic Neuritis Ear/Nose/Mouth/Throat Medical History: Negative for: Chronic sinus problems/congestion; Middle ear problems Hematologic/Lymphatic Medical History: Negative for: Anemia; Hemophilia; Human Immunodeficiency Virus; Lymphedema; Sickle Cell Disease Respiratory Medical History: Negative for: Aspiration; Asthma; Chronic Obstructive Pulmonary Disease (COPD); Pneumothorax; Sleep Apnea; Tuberculosis Cardiovascular Medical History: Positive for: Arrhythmia - atrial flutter; Congestive Heart Failure; Hypertension Negative for: Angina; Coronary Artery Disease; Deep Vein Thrombosis; Hypotension; Myocardial Infarction; Peripheral Arterial Disease; Peripheral Venous Disease; Phlebitis; Vasculitis Past Medical History Notes: complete heart block s/p medtronic pacemaker status post patch cllosure of  ASD Gastrointestinal Medical History: Negative for: Cirrhosis ; Colitis; Crohns; Hepatitis A; Hepatitis B; Hepatitis C Endocrine Medical History: Negative for: Type I Diabetes; Type II Diabetes Genitourinary Medical History: Negative for: End Stage Renal Disease Immunological Medical History: Negative for: Lupus Erythematosus; Raynauds; Scleroderma Integumentary (Skin) Medical History: Negative for: History of Burn Musculoskeletal Medical History: Negative for: Gout; Rheumatoid Arthritis; Osteoarthritis; Osteomyelitis Neurologic Medical History: Negative for: Dementia; Neuropathy; Quadriplegia; Paraplegia; Seizure Disorder Oncologic Medical History: Negative for: Received Chemotherapy; Received Radiation Psychiatric Medical History: Negative for: Anorexia/bulimia; Confinement Anxiety Past Medical History Notes: anxiety and depression HBO Extended History Items Eyes: Glaucoma Immunizations Pneumococcal Vaccine: Received Pneumococcal Vaccination: Yes Received Pneumococcal Vaccination On or After 60th Birthday: No Implantable Devices Yes Family and Social History Unknown History: Yes; Never smoker; Marital Status - Married; Alcohol Use: Never; Drug Use: No History; Caffeine Use: Rarely; Financial Concerns: No; Food, Clothing or Shelter Needs: No; Support System Lacking: No; Transportation Concerns: No Electronic Signature(s) Signed: 07/25/2021 9:44:03 AM By: Geralyn Corwin DO Signed: 07/25/2021 1:36:18 PM By: [name] Entered By: Geralyn Corwin on 07/25/2021 09:42:08 -------------------------------------------------------------------------------- SuperBill Details Patient Name: Date of Service: [name] Baird NA S. 07/25/2021 Medical Record Number: [id number] Patient Account Number: [id number] Date of Birth/Sex: Treating RN: [birth year] (87 y.o. 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

## 2021-07-25 NOTE — Progress Notes (Signed)
[name], [name] ([id number]) Visit Report for 07/25/2021 Arrival Information Details Patient Name: Date of Service: [name], [name] 07/25/2021 8:30 A M Medical Record Number: [id number] Patient Account Number: [id number] Date of Birth/Sex: Treating RN: 05-Apr-1933 (87 y.o. [name] Baird Baird Primary Care Samul Mcinroy: Demetrius Revel Other Clinician: Referring Nahzir Pohle: Treating Doretha Goding/Extender: Claybon Jabs in Treatment: 8 Visit Information History Since Last Visit Added or deleted any medications: No Patient Arrived: Wheel Chair Any new allergies or adverse reactions: No Arrival Time: 08:34 Had a fall or experienced change in No Accompanied By: daughter activities of daily living that may affect Transfer Assistance: None risk of falls: Patient Identification Verified: Yes Signs or symptoms of abuse/neglect since last visito No Secondary Verification Process Completed: Yes Hospitalized since last visit: No Patient Requires Transmission-Based Precautions: No Implantable device outside of the clinic excluding No Patient Has Alerts: Yes cellular tissue based products placed in the center Patient Alerts: R ABI=1.03 TBI=.42 since last visit: L ABI=.97 TBI=.61 Has Dressing in Place as Prescribed: Yes Pain Present Now: No Electronic Signature(s) Signed: 07/25/2021 12:35:09 PM By: Levan Hurst RN, BSN Entered By: Levan Hurst on 07/25/2021 08:34:30 -------------------------------------------------------------------------------- Encounter Discharge Information Details Patient Name: Date of Service: [name] Baird [facility] [place] S. 07/25/2021 8:30 A M Medical Record Number: [id number] Patient Account Number: [id number] Date of Birth/Sex: Treating RN: 09-Nov-1933 (87 y.o. [name] Baird Baird Primary Care [name] Baird Baird: Demetrius Revel Other Clinician: Referring Kosei Rhodes: Treating Rebacca Votaw/Extender: Claybon Jabs in Treatment: 8 Encounter Discharge Information Items Post  Procedure Vitals Discharge Condition: Stable Temperature (F): 98 Ambulatory Status: Wheelchair Pulse (bpm): 78 Discharge Destination: Home Respiratory Rate (breaths/min): 16 Transportation: Private Auto Blood Pressure (mmHg): 123/79 Accompanied By: Daughter Schedule Follow-up Appointment: Yes Clinical Summary of Care: Provided on 07/25/2021 Form Type Recipient Paper Patient Patient Electronic Signature(s) Signed: 07/25/2021 1:36:18 PM By: Lorrin Jackson Entered By: Lorrin Jackson on 07/25/2021 09:17:01 -------------------------------------------------------------------------------- Lower Extremity Assessment Details Patient Name: Date of Service: [name] Baird [place] S. 07/25/2021 8:30 A M Medical Record Number: [id number] Patient Account Number: [id number] Date of Birth/Sex: Treating RN: [birth year] (87 y.o. [name] Baird Baird Primary Care [name] Baird Baird: Demetrius Revel Other Clinician: Referring Blaize Nipper: Treating Davene Jobin/Extender: Claybon Jabs in Treatment: 8 Edema Assessment Assessed: [name] Baird Baird: No] [name] Baird Baird: No] Edema: [Left: Ye] [Right: s] Calf Left: Right: Point of Measurement: 27 cm From Medial Instep 29 cm Ankle Left: Right: Point of Measurement: 10 cm From Medial Instep 19 cm Vascular Assessment Pulses: Dorsalis Pedis Palpable: [Right:Yes] Electronic Signature(s) Signed: 07/25/2021 12:35:09 PM By: Levan Hurst RN, BSN Entered By: Levan Hurst on 07/25/2021 08:34:55 -------------------------------------------------------------------------------- Multi Wound Chart Details Patient Name: Date of Service: [name] Baird [facility] NA S. 07/25/2021 8:30 A M Medical Record Number: [id number] Patient Account Number: [id number] Date of Birth/Sex: Treating RN: Dec 04, 1933 (87 y.o. [name] Baird Baird Primary Care [name] Baird Baird: Demetrius Revel Other Clinician: Referring Mertha Clyatt: Treating Naturi Alarid/Extender: Claybon Jabs in Treatment: 8 Vital  Signs Height(in): Pulse(bpm): 72 Weight(lbs): Blood Pressure(mmHg): 123/79 Body Mass Index(BMI): Temperature(F): 98.0 Respiratory Rate(breaths/min): 16 Photos: [1:Right, Lateral Malleolus] [N/A:N/A N/A] Wound Location: [1:Trauma] [N/A:N/A] Wounding Event: [1:Venous Leg Ulcer] [N/A:N/A] Primary Etiology: [1:Glaucoma, Arrhythmia, Congestive N/A] Comorbid History: [1:Heart Failure, Hypertension 01/21/2021] [N/A:N/A] Date Acquired: [1:8] [N/A:N/A] Weeks of Treatment: [1:Open] [N/A:N/A] Wound Status: [1:0.2x0.3x0.1] [N/A:N/A] Measurements L x W x D (cm) [1:0.047] [N/A:N/A] A (cm) : rea [1:0.005] [N/A:N/A] Volume (cm) : [1:0.00%] [N/A:N/A] % Reduction in A [1:rea: 44.40%] [N/A:N/A] % Reduction in Volume: [1:Full Thickness Without Exposed] [N/A:N/A] Classification: [1:Support Structures  Medium] [N/A:N/A] Exudate A mount: [1:Serosanguineous] [N/A:N/A] Exudate Type: [1:red, brown] [N/A:N/A] Exudate Color: [1:Distinct, outline attached] [N/A:N/A] Wound Margin: [1:Small (1-33%)] [N/A:N/A] Granulation A mount: [1:Pink] [N/A:N/A] Granulation Quality: [1:Large (67-100%)] [N/A:N/A] Necrotic A mount: [1:Fat Layer (Subcutaneous Tissue): Yes N/A] Exposed Structures: [1:Fascia: No Tendon: No Muscle: No Joint: No Bone: No Medium (34-66%)] [N/A:N/A] Epithelialization: [1:Debridement - Excisional] [N/A:N/A] Debridement: Pre-procedure Verification/Time Out 09:07 [N/A:N/A] Taken: [1:Other] [N/A:N/A] Pain Control: [1:Subcutaneous] [N/A:N/A] Tissue Debrided: [1:Skin/Subcutaneous Tissue] [N/A:N/A] Level: [1:0.06] [N/A:N/A] Debridement A (sq cm): [1:rea Curette] [N/A:N/A] Instrument: [1:Minimum] [N/A:N/A] Bleeding: [1:Pressure] [N/A:N/A] Hemostasis A chieved: [1:Procedure was tolerated well] [N/A:N/A] Debridement Treatment Response: [1:0.2x0.3x0.1] [N/A:N/A] Post Debridement Measurements L x W x D (cm) [1:0.005] [N/A:N/A] Post Debridement Volume: (cm) [1:Debridement] [N/A:N/A] Treatment  Notes Wound #1 (Malleolus) Wound Laterality: Right, Lateral Cleanser Soap and Water Discharge Instruction: May shower and wash wound with dial antibacterial soap and water prior to dressing change. Wound Cleanser Discharge Instruction: Cleanse the wound with wound cleanser prior to applying a clean dressing using gauze sponges, not tissue or cotton balls. Peri-Wound Care Skin Prep Discharge Instruction: Use skin prep as directed Topical Primary Dressing Santyl Ointment Discharge Instruction: Apply nickel thick amount to wound bed, under Hydrofera Secondary Dressing Zetuvit Plus Silicone Border Dressing 5x5 (in/in) Discharge Instruction: Apply silicone border over primary dressing as directed. Secured With Compression Wrap Compression Stockings Environmental education officer) Signed: 07/25/2021 9:44:03 AM By: Kalman Shan DO Signed: 07/25/2021 1:36:18 PM By: Lorrin Jackson Entered By: Kalman Shan on 07/25/2021 09:41:10 -------------------------------------------------------------------------------- Multi-Disciplinary Care Plan Details Patient Name: Date of Service: [name] Baird Baird, [name] Baird [place] S. 07/25/2021 8:30 A M Medical Record Number: [id number] Patient Account Number: [id number] Date of Birth/Sex: Treating RN: 06-06-1933 (87 y.o. [name] Baird Baird Primary Care Pratik Dalziel: Demetrius Revel Other Clinician: Referring Asencion Guisinger: Treating Dannetta Lekas/Extender: Claybon Jabs in Treatment: 8 Active Inactive Wound/Skin Impairment Nursing Diagnoses: Impaired tissue integrity Goals: Patient/caregiver will verbalize understanding of skin care regimen Date Initiated: 05/30/2021 Date Inactivated: 07/25/2021 Target Resolution Date: 07/25/2021 Goal Status: Met Ulcer/skin breakdown will have a volume reduction of 30% by week 4 Date Initiated: 05/30/2021 Date Inactivated: 06/27/2021 Target Resolution Date: 06/27/2021 Goal Status: Unmet Unmet Reason: non viable tissue Ulcer/skin  breakdown will have a volume reduction of 50% by week 8 Date Initiated: 07/11/2021 Target Resolution Date: 08/08/2021 Goal Status: Active Interventions: Assess patient/caregiver ability to obtain necessary supplies Assess patient/caregiver ability to perform ulcer/skin care regimen upon admission and as needed Assess ulceration(s) every visit Provide education on ulcer and skin care Treatment Activities: Skin care regimen initiated : 05/30/2021 Topical wound management initiated : 05/30/2021 Notes: Electronic Signature(s) Signed: 07/25/2021 1:36:18 PM By: Lorrin Jackson Entered By: Lorrin Jackson on 07/25/2021 09:14:10 -------------------------------------------------------------------------------- Pain Assessment Details Patient Name: Date of Service: [name] Baird [place] S. 07/25/2021 8:30 A M Medical Record Number: [id number] Patient Account Number: [id number] Date of Birth/Sex: Treating RN: [birth year] (87 y.o. [name] Baird Baird Primary Care Lakeisa Heninger: Demetrius Revel Other Clinician: Referring Kimora Stankovic: Treating Jessicalynn Deshong/Extender: Claybon Jabs in Treatment: 8 Active Problems Location of Pain Severity and Description of Pain Patient Has Paino No Site Locations Pain Management and Medication Current Pain Management: Electronic Signature(s) Signed: 07/25/2021 12:35:09 PM By: Levan Hurst RN, BSN Entered By: Levan Hurst on 07/25/2021 08:34:51 -------------------------------------------------------------------------------- Patient/Caregiver Education Details Patient Name: Date of Service: [name] Baird Baird 8/5/2022andnbsp8:30 A M Medical Record Number: [id number] Patient Account Number: [id number] Date of Birth/Gender: Treating RN: 21-Oct-1933 (87 y.o. [name] Baird Baird Primary Care Physician: Demetrius Revel Other Clinician: Referring  Physician: Treating Physician/Extender: Claybon Jabs in Treatment: 8 Education Assessment Education  Provided To: Patient and Caregiver Education Topics Provided Wound/Skin Impairment: Methods: Explain/Verbal, Printed Responses: State content correctly [employer]) Signed: 07/25/2021 1:36:18 PM By: Lorrin Jackson Entered By: Lorrin Jackson on 07/25/2021 09:09:39 -------------------------------------------------------------------------------- Wound Assessment Details Patient Name: Date of Service: [name] Baird [facility] NA S. 07/25/2021 8:30 A M Medical Record Number: [id number] Patient Account Number: [id number] Date of Birth/Sex: Treating RN: 1933-01-21 (87 y.o. [name] Baird Baird Primary Care Katai Marsico: Demetrius Revel Other Clinician: Referring Karenann Mcgrory: Treating Collan Schoenfeld/Extender: Claybon Jabs in Treatment: 8 Wound Status Wound Number: 1 Primary Etiology: Venous Leg Ulcer Wound Location: Right, Lateral Malleolus Wound Status: Open Wounding Event: Trauma Comorbid Glaucoma, Arrhythmia, Congestive Heart Failure, History: Hypertension Date Acquired: 01/21/2021 Weeks Of Treatment: 8 Clustered Wound: No Photos Wound Measurements Length: (cm) 0.2 Width: (cm) 0.3 Depth: (cm) 0.1 Area: (cm) 0.047 Volume: (cm) 0.005 % Reduction in Area: 0% % Reduction in Volume: 44.4% Epithelialization: Medium (34-66%) Tunneling: No Undermining: No Wound Description Classification: Full Thickness Without Exposed Support Structures Wound Margin: Distinct, outline attached Exudate Amount: Medium Exudate Type: Serosanguineous Exudate Color: red, brown Foul Odor After Cleansing: No Slough/Fibrino Yes Wound Bed Granulation Amount: Small (1-33%) Exposed Structure Granulation Quality: Pink Fascia Exposed: No Necrotic Amount: Large (67-100%) Fat Layer (Subcutaneous Tissue) Exposed: Yes Necrotic Quality: Adherent Slough Tendon Exposed: No Muscle Exposed: No Joint Exposed: No Bone Exposed: No Treatment Notes Wound #1 (Malleolus) Wound Laterality: Right,  Lateral Cleanser Soap and Water Discharge Instruction: May shower and wash wound with dial antibacterial soap and water prior to dressing change. Wound Cleanser Discharge Instruction: Cleanse the wound with wound cleanser prior to applying a clean dressing using gauze sponges, not tissue or cotton balls. Peri-Wound Care Skin Prep Discharge Instruction: Use skin prep as directed Topical Primary Dressing Santyl Ointment Discharge Instruction: Apply nickel thick amount to wound bed, under Hydrofera Secondary Dressing Zetuvit Plus Silicone Border Dressing 5x5 (in/in) Discharge Instruction: Apply silicone border over primary dressing as directed. Secured With Compression Wrap Compression Stockings Environmental education officer) Signed: 07/25/2021 12:35:09 PM By: Levan Hurst RN, BSN Signed: 07/25/2021 1:36:18 PM By: Lorrin Jackson Entered By: Lorrin Jackson on 07/25/2021 08:36:48 -------------------------------------------------------------------------------- [place] Details Patient Name: Date of Service: [name] Baird Baird, [name] Baird NA S. 07/25/2021 8:30 A M Medical Record Number: [id number] Patient Account Number: [id number] Date of Birth/Sex: Treating RN: February 26, 1933 (87 y.o. [name] Baird Baird Primary Care Philander Ake: Demetrius Revel Other Clinician: Referring Aashi Derrington: Treating Skyelynn Rambeau/Extender: Claybon Jabs in Treatment: 8 Vital Signs Time Taken: 08:34 Temperature (F): 98.0 Pulse (bpm): 78 Respiratory Rate (breaths/min): 16 Blood Pressure (mmHg): 123/79 Reference Range: 80 - 120 mg / dl Electronic Signature(s) Signed: 07/25/2021 12:35:09 PM By: Levan Hurst RN, BSN Entered By: Levan Hurst on 07/25/2021 08:34:46

## 2021-08-08 ENCOUNTER — Encounter (HOSPITAL_BASED_OUTPATIENT_CLINIC_OR_DEPARTMENT_OTHER): Payer: Medicare Other | Admitting: Internal Medicine

## 2021-08-08 ENCOUNTER — Other Ambulatory Visit: Payer: Self-pay

## 2021-08-08 DIAGNOSIS — I480 Paroxysmal atrial fibrillation: Secondary | ICD-10-CM | POA: Diagnosis not present

## 2021-08-08 DIAGNOSIS — Z7901 Long term (current) use of anticoagulants: Secondary | ICD-10-CM | POA: Diagnosis not present

## 2021-08-08 DIAGNOSIS — I872 Venous insufficiency (chronic) (peripheral): Secondary | ICD-10-CM | POA: Diagnosis not present

## 2021-08-08 DIAGNOSIS — L97319 Non-pressure chronic ulcer of right ankle with unspecified severity: Secondary | ICD-10-CM

## 2021-08-08 NOTE — Progress Notes (Signed)
REGENA, DELUCCHI ([id number]) Visit Report for 08/08/2021 Chief Complaint Document Details Patient Name: Date of Service: [name] Baird, [name] Baird. 08/08/2021 8:30 A M Medical Record Number: [id number] Patient Account Number: [id number] Date of Birth/Sex: Treating RN: June 30, 1933 (87 y.o. [name] Primary Care Provider: Bjorn Pippin Other Clinician: Referring Provider: Treating Provider/Extender: Kathyrn Drown in Treatment: 10 Information Obtained from: Patient Chief Complaint Right ankle wound Electronic Signature(s) Signed: 08/08/2021 9:06:35 AM By: Geralyn Corwin DO Entered By: Geralyn Corwin on 08/08/2021 09:00:36 -------------------------------------------------------------------------------- Debridement Details Patient Name: Date of Service: [name] Baird, [name] NA S. 08/08/2021 8:30 A M Medical Record Number: [id number] Patient Account Number: [id number] Date of Birth/Sex: Treating RN: [birth year] (87 y.o. [name] Primary Care Provider: Bjorn Pippin Other Clinician: Referring Provider: Treating Provider/Extender: Kathyrn Drown in Treatment: 10 Debridement Performed for Assessment: Wound #1 Right,Lateral Malleolus Performed By: Physician Geralyn Corwin, DO Debridement Type: Chemical/Enzymatic/Mechanical Agent Used: Santyl Severity of Tissue Pre Debridement: Fat layer exposed Level of Consciousness (Pre-procedure): Awake and Alert Pre-procedure Verification/Time Out Yes - 08:50 Taken: Start Time: 08:51 Bleeding: None End Time: 08:52 Response to Treatment: Procedure was tolerated well Level of Consciousness (Post- Awake and Alert procedure): Post Debridement Measurements of Total Wound Length: (cm) 0.2 Width: (cm) 0.2 Depth: (cm) 0.1 Volume: (cm) 0.003 Character of Wound/Ulcer Post Debridement: Stable Severity of Tissue Post Debridement: Fat layer exposed Post Procedure Diagnosis Same as Pre-procedure Electronic  Signature(s) Signed: 08/08/2021 9:06:35 AM By: Geralyn Corwin DO Signed: 08/08/2021 12:15:18 PM By: [name] Entered By: [name] on 08/08/2021 08:53:11 -------------------------------------------------------------------------------- HPI Details Patient Name: Date of Service: [name] Baird, [name] NA S. 08/08/2021 8:30 A M Medical Record Number: [id number] Patient Account Number: [id number] Date of Birth/Sex: Treating RN: 03/16/1933 (87 y.o. [name] Primary Care Provider: Bjorn Pippin Other Clinician: Referring Provider: Treating Provider/Extender: Kathyrn Drown in Treatment: 10 History of Present Illness HPI Description: Admission 6/10 Ms. Carinna Newhart is an 87 year old female with a past medical history of paroxysmal A. fib on anticoagulation, congenital heart disease (atrial septal defect status post patch closure), pulmonic stenosis (status post valvotomy 1985), left pulmonary artery aneurysm, complete heart block status post dual-chamber permanent pacemaker implantation that presents to the clinic for a right lateral malleolus wound that developed 3 months ago. She is not quite sure how it started but thinks she may have hit it against her walker. She has been placing Neosporin on this wound daily. She reports pain to the area worse at night and often finds herself laying her leg over the side of the bed. She has been given doxycycline for this issue by her podiatrist. She currently denies signs of infection. She reports trying to offload the area the best she can daily. 6/24; patient presents for 2-week follow-up. She has been using collagen and activating it with K-Y jelly. She has tolerated this well. She continues to have nocturnal pain at the wound site. She is scheduled to see vein and vascular on 7/6. She denies signs of infection. 7/8; patient presents for 2-week follow-up. She has been using collagen to the wound. She has no issues or complaints  today. She did see Dr. Edilia Bo with vein and vascular and was told she had adequate blood flow for wound healing. She denies signs of infection. 7/15; patient presents for 1 week follow-up. She has tolerated the compression wrap. We changed the dressing last week to Santyl and Hydrofera Blue. She has no issues or complaints today. She  denies signs of infection. 7/22; patient presents for 1 week follow-up. She continues to tolerate the compression wrap well. We were using Hydrofera Blue and Santyl under this. She has no issues or complaints today. She thinks the wound has improved slightly over the past week. She denies signs of infection. 8/5; patient presents for follow-up. She has been using Santyl to the wound bed daily. She has noticed improvement in nocturnal pain over the past week. She denies signs of infection. 8/19; patient presents for follow-up. She continues to use Santyl to the wound beds daily. She has no issues or complaints today. She denies signs of infection. Electronic Signature(s) Signed: 08/08/2021 9:06:35 AM By: Geralyn Corwin DO Entered By: Geralyn Corwin on 08/08/2021 09:01:07 -------------------------------------------------------------------------------- Physical Exam Details Patient Name: Date of Service: [name] Baird, [name] NA S. 08/08/2021 8:30 A M Medical Record Number: [id number] Patient Account Number: [id number] Date of Birth/Sex: Treating RN: 10/18/1933 (87 y.o. [name] Primary Care Provider: Bjorn Pippin Other Clinician: Referring Provider: Treating Provider/Extender: Kathyrn Drown in Treatment: 10 Constitutional respirations regular, non-labored and within target range for patient.. Cardiovascular 2+ dorsalis pedis/posterior tibialis pulses. Psychiatric pleasant and cooperative. Notes Right ankle: Lateral malleolus with small open wound with nonviable tissue. No signs of infection. Electronic Signature(s) Signed: 08/08/2021  9:06:35 AM By: Geralyn Corwin DO Entered By: Geralyn Corwin on 08/08/2021 09:02:12 -------------------------------------------------------------------------------- Physician Orders Details Patient Name: Date of Service: [name] Baird, [name] NA S. 08/08/2021 8:30 A M Medical Record Number: [id number] Patient Account Number: [id number] Date of Birth/Sex: Treating RN: 01-21-1933 (87 y.o. 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 - with Dr. Mikey Bussing Return A Bathing/ Shower/ Hygiene May shower and wash wound with soap and water. Edema Control - Lymphedema / SCD / Other Elevate legs to the level of the heart or above for 30 minutes daily and/or when sitting, a frequency of: - throughout the day Avoid standing for long periods of time. Exercise regularly Moisturize legs daily. Additional Orders / Instructions Follow Nutritious Diet Wound Treatment Wound #1 - Malleolus Wound Laterality: Right, Lateral Cleanser: Soap and Water 1 x Per Day/30 Days Discharge Instructions: May shower and wash wound with dial antibacterial soap and water prior to dressing change. Cleanser: Wound Cleanser 1 x Per Day/30 Days Discharge Instructions: Cleanse the wound with wound cleanser prior to applying a clean dressing using gauze sponges, not tissue or cotton balls. Peri-Wound Care: Skin Prep 1 x Per Day/30 Days Discharge Instructions: Use skin prep as directed Prim Dressing: PolyMem  Silver Non-Adhesive Dressing, 4.25x4.25 in 1 x Per Day/30 Days ary Discharge Instructions: Apply to wound bed as instructed Prim Dressing: Santyl Ointment 1 x Per Day/30 Days ary Discharge Instructions: Apply nickel thick amount to wound bed, under Hydrofera Secondary Dressing: Zetuvit Plus Silicone Border Dressing 5x5 (in/in) 1 x Per Day/30 Days Discharge Instructions: Apply silicone border over primary dressing as directed. Electronic Signature(s) Signed: 08/08/2021 9:06:35 AM By: Geralyn Corwin DO Entered By: Geralyn Corwin on 08/08/2021 09:03:41 -------------------------------------------------------------------------------- Problem List Details Patient Name: Date of Service: [name] Baird, [name] NA S. 08/08/2021 8:30 A M Medical Record Number: [id number]  Patient Account Number: [id number] Date of Birth/Sex: Treating RN: [birth year] (87 y.o. 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 05/30/2021 No Yes I48.0 Paroxysmal atrial fibrillation 05/30/2021 No Yes Z79.01 Long term (current) use of anticoagulants 05/30/2021 No Yes Z95.0 Presence of cardiac pacemaker 05/30/2021 No Yes M79.606 Pain in leg, unspecified 05/30/2021 No Yes I87.2 Venous insufficiency (chronic) (peripheral) 07/11/2021 No Yes Inactive Problems Resolved Problems Electronic Signature(s) Signed: 08/08/2021 9:06:35 AM By: Geralyn Corwin DO Entered By: Geralyn Corwin on 08/08/2021 09:00:23 -------------------------------------------------------------------------------- Progress Note Details Patient Name: Date of Service: [name] Baird, [name] NA S. 08/08/2021 8:30 A M Medical Record Number: [id number] Patient Account Number: [id number] Date of  Birth/Sex: Treating RN: 1933/03/16 (87 y.o. [name] Primary Care Provider: Bjorn Pippin Other Clinician: Referring Provider: Treating Provider/Extender: Kathyrn Drown in Treatment: 10 Subjective Chief Complaint Information obtained from Patient Right ankle wound History of Present Illness (HPI) Admission 6/10 [name] is an 87 year old female with a past medical history of paroxysmal A. fib on anticoagulation, congenital heart disease (atrial septal defect status post patch closure), pulmonic stenosis (status post valvotomy 1985), left pulmonary artery aneurysm, complete heart block status post dual-chamber permanent pacemaker implantation that presents to the clinic for a right lateral malleolus wound that developed 3 months ago. She is not quite sure how it started but thinks she may have hit it against her walker. She has been placing Neosporin on this wound daily. She reports pain to the area worse at night and often finds herself laying her leg over the side of the bed. She has been given doxycycline for this issue by her podiatrist. She currently denies signs of infection. She reports trying to offload the area the best she can daily. 6/24; patient presents for 2-week follow-up. She has been using collagen and activating it with K-Y jelly. She has tolerated this well. She continues to have nocturnal pain at the wound site. She is scheduled to see vein and vascular on 7/6. She denies signs of infection. 7/8; patient presents for 2-week follow-up. She has been using collagen to the wound. She has no issues or complaints today. She did see Dr. Edilia Bo with vein and vascular and was told she had adequate blood flow for wound healing. She denies signs of infection. 7/15; patient presents for 1 week follow-up. She has tolerated the compression wrap. We changed the dressing last week to Santyl and Hydrofera Blue. She has no issues or complaints today. She  denies signs of infection. 7/22; patient presents for 1 week follow-up. She continues to tolerate the compression wrap well. We were using Hydrofera Blue and Santyl under this. She has no issues or complaints today. She thinks the wound has improved slightly over the past week. She denies signs of infection. 8/5; patient presents for follow-up. She has been using Santyl to the wound bed daily. She has noticed improvement in nocturnal pain over the past week. She denies signs of infection. 8/19; patient presents for follow-up. She continues to use Santyl to the wound beds daily. She has no issues or complaints today. She denies signs of infection. Patient History Information obtained from Patient. Family History Unknown History. Social History Never smoker, Marital Status - Married, Alcohol Use - Never, Drug Use - No History, Caffeine Use - Rarely. Medical  History Eyes Patient has history of Glaucoma Denies history of Cataracts, Optic Neuritis Ear/Nose/Mouth/Throat Denies history of Chronic sinus problems/congestion, Middle ear problems Hematologic/Lymphatic Denies history of Anemia, Hemophilia, Human Immunodeficiency Virus, Lymphedema, Sickle Cell Disease Respiratory Denies history of Aspiration, Asthma, Chronic Obstructive Pulmonary Disease (COPD), Pneumothorax, Sleep Apnea, Tuberculosis Cardiovascular Patient has history of Arrhythmia - atrial flutter, Congestive Heart Failure, Hypertension Denies history of Angina, Coronary Artery Disease, Deep Vein Thrombosis, Hypotension, Myocardial Infarction, Peripheral Arterial Disease, Peripheral Venous Disease, Phlebitis, Vasculitis Gastrointestinal Denies history of Cirrhosis , Colitis, Crohnoos, Hepatitis A, Hepatitis B, Hepatitis C Endocrine Denies history of Type I Diabetes, Type II Diabetes Genitourinary Denies history of End Stage Renal Disease Immunological Denies history of Lupus Erythematosus, Raynaudoos,  Scleroderma Integumentary (Skin) Denies history of History of Burn Musculoskeletal Denies history of Gout, Rheumatoid Arthritis, Osteoarthritis, Osteomyelitis Neurologic Denies history of Dementia, Neuropathy, Quadriplegia, Paraplegia, Seizure Disorder Oncologic Denies history of Received Chemotherapy, Received Radiation Psychiatric Denies history of Anorexia/bulimia, Confinement Anxiety Medical A Surgical History Notes nd Cardiovascular complete heart block s/p medtronic pacemaker status post patch cllosure of ASD Psychiatric anxiety and depression Objective Constitutional respirations regular, non-labored and within target range for patient.. Vitals Time Taken: 8:33 AM, Temperature: 98.3 F, Pulse: 96 bpm, Respiratory Rate: 18 breaths/min, Blood Pressure: 142/85 mmHg. Cardiovascular 2+ dorsalis pedis/posterior tibialis pulses. Psychiatric pleasant and cooperative. General Notes: Right ankle: Lateral malleolus with small open wound with nonviable tissue. No signs of infection. Integumentary (Hair, Skin) Wound #1 status is Open. Original cause of wound was Trauma. The date acquired was: 01/21/2021. The wound has been in treatment 10 weeks. The wound is located on the Right,Lateral Malleolus. The wound measures 0.2cm length x 0.2cm width x 0.1cm depth; 0.031cm^2 area and 0.003cm^3 volume. There is Fat Layer (Subcutaneous Tissue) exposed. There is no tunneling or undermining noted. There is a medium amount of serosanguineous drainage noted. The wound margin is distinct with the outline attached to the wound base. There is large (67-100%) red, pink granulation within the wound bed. There is a small (1-33%) amount of necrotic tissue within the wound bed including Adherent Slough. 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 No signs of infection on exam. She has been using Santyl daily and I recommended adding PolyMem silver to see if we get more debridement Since she still has nonviable tissue. It is such a small area and has been difficult to treat. May consider Iodoflex at next visit if no improvement. Procedures Wound #1 Pre-procedure diagnosis of Wound #1 is a Venous Leg Ulcer located on the Right,Lateral Malleolus .Severity of Tissue Pre Debridement is: Fat layer exposed. There was a Chemical/Enzymatic/Mechanical debridement performed by Geralyn Corwin, DO.[name] Kitchen Agent used was [employer]. A time out was conducted at 08:50, prior to the start of the procedure. There was no bleeding. The procedure was tolerated well. Post Debridement Measurements: 0.2cm length x 0.2cm width x 0.1cm depth; 0.003cm^3 volume. Character of Wound/Ulcer Post Debridement is stable. Severity of Tissue Post Debridement is: Fat layer exposed. Post procedure Diagnosis Wound #1: Same as Pre-Procedure Plan Follow-up Appointments: Return Appointment in 2 weeks. - with Dr. Mikey Bussing Bathing/ Shower/ Hygiene: May shower and wash wound with soap and water. Edema Control - Lymphedema / SCD / Other: Elevate legs to the level of the heart or above for 30 minutes daily  and/or when sitting, a frequency of: - throughout the day Avoid standing for long periods of time. Exercise regularly Moisturize legs daily. Additional Orders / Instructions: Follow Nutritious Diet WOUND #1: - Malleolus Wound Laterality: Right, Lateral Cleanser: Soap and Water 1 x Per Day/30 Days Discharge Instructions: May shower and wash wound with dial antibacterial soap and water prior to dressing change. Cleanser: Wound Cleanser 1 x Per Day/30 Days Discharge Instructions: Cleanse the wound with wound cleanser prior to applying a clean dressing using gauze sponges, not tissue  or cotton balls. Peri-Wound Care: Skin Prep 1 x Per Day/30 Days Discharge Instructions: Use skin prep as directed Prim Dressing: PolyMem Silver Non-Adhesive Dressing, 4.25x4.25 in 1 x Per Day/30 Days ary Discharge Instructions: Apply to wound bed as instructed Prim Dressing: Santyl Ointment 1 x Per Day/30 Days ary Discharge Instructions: Apply nickel thick amount to wound bed, under Hydrofera Secondary Dressing: Zetuvit Plus Silicone Border Dressing 5x5 (in/in) 1 x Per Day/30 Days Discharge Instructions: Apply silicone border over primary dressing as directed. 1. Continue Santyl and add PolyMem silver for daily dressing changes 2. Follow-up in 2 weeks Electronic Signature(s) Signed: 08/08/2021 9:06:35 AM By: Geralyn CorwinHoffman, Latasia Silberstein DO Entered By: Geralyn CorwinHoffman, Tylyn Derwin on 08/08/2021 09:05:49 -------------------------------------------------------------------------------- HxROS Details Patient Name: Date of Service: [name] MeckelBUSICK, [name] NA S. 08/08/2021 8:30 A M Medical Record Number: [id number] Patient Account Number: [id number] Date of Birth/Sex: Treating RN: 05-14-1933 (87 y.o. Wynelle LinkF) Lynch, Shatara Primary Care Provider: Bjorn PippinGray, Joseph Other Clinician: Referring Provider: Treating Provider/Extender: Kathyrn DrownHoffman, Dorsel Flinn Gray, Joseph Weeks in Treatment: 10 Information Obtained From Patient Eyes Medical History: Positive for: Glaucoma Negative for: Cataracts; Optic Neuritis Ear/Nose/Mouth/Throat Medical History: Negative for: Chronic sinus problems/congestion; Middle ear problems Hematologic/Lymphatic Medical History: Negative for: Anemia; Hemophilia; Human Immunodeficiency Virus; Lymphedema; Sickle Cell Disease Respiratory Medical History: Negative for: Aspiration; Asthma; Chronic Obstructive Pulmonary Disease (COPD); Pneumothorax; Sleep Apnea; Tuberculosis Cardiovascular Medical History: Positive for: Arrhythmia - atrial flutter; Congestive Heart Failure; Hypertension Negative for: Angina; Coronary  Artery Disease; Deep Vein Thrombosis; Hypotension; Myocardial Infarction; Peripheral Arterial Disease; Peripheral Venous Disease; Phlebitis; Vasculitis Past Medical History Notes: complete heart block s/p medtronic pacemaker status post patch cllosure of ASD Gastrointestinal Medical History: Negative for: Cirrhosis ; Colitis; Crohns; Hepatitis A; Hepatitis B; Hepatitis C Endocrine Medical History: Negative for: Type I Diabetes; Type II Diabetes Genitourinary Medical History: Negative for: End Stage Renal Disease Immunological Medical History: Negative for: Lupus Erythematosus; Raynauds; Scleroderma Integumentary (Skin) Medical History: Negative for: History of Burn Musculoskeletal Medical History: Negative for: Gout; Rheumatoid Arthritis; Osteoarthritis; Osteomyelitis Neurologic Medical History: Negative for: Dementia; Neuropathy; Quadriplegia; Paraplegia; Seizure Disorder Oncologic Medical History: Negative for: Received Chemotherapy; Received Radiation Psychiatric Medical History: Negative for: Anorexia/bulimia; Confinement Anxiety Past Medical History Notes: anxiety and depression HBO Extended History Items Eyes: Glaucoma Immunizations Pneumococcal Vaccine: Received Pneumococcal Vaccination: Yes Received Pneumococcal Vaccination On or After 60th Birthday: No Implantable Devices Yes Family and Social History Unknown History: Yes; Never smoker; Marital Status - Married; Alcohol Use: Never; Drug Use: No History; Caffeine Use: Rarely; Financial Concerns: No; Food, Clothing or Shelter Needs: No; Support System Lacking: No; Transportation Concerns: No Electronic Signature(s) Signed: 08/08/2021 9:06:35 AM By: Geralyn CorwinHoffman, Eden Rho DO Signed: 08/08/2021 12:21:50 PM By: Zandra AbtsLynch, Shatara RN, BSN Entered By: Geralyn CorwinHoffman, Beauden Tremont on 08/08/2021 09:01:14 -------------------------------------------------------------------------------- SuperBill Details Patient Name: Date of  Service: [name] MeckelBUSICK, [name] NA S. 08/08/2021 Medical Record Number: [id number] Patient Account Number: [id number] Date of Birth/Sex: Treating RN: 05-14-1933 (87 y.o. 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 I48.0 Paroxysmal atrial fibrillation Z79.01 Long term (current) use of anticoagulants Z95.0 Presence of cardiac pacemaker M79.606 Pain in leg, unspecified I87.2 Venous insufficiency (chronic) (peripheral) Facility Procedures CPT4 Code: [id number] Description: [id number] - DEBRIDE W/O ANES NON SELECT ICD-10 Diagnosis Description 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

## 2021-08-08 NOTE — Progress Notes (Signed)
[name] Baird, [name] Baird ([id number]) Visit Report for 08/08/2021 Arrival Information Details Patient Name: Date of Service: [name] Baird. 08/08/2021 8:30 A M Medical Record Number: [id number] Patient Account Number: [id number] Date of Birth/Sex: Treating RN: 1933/02/04 (87 y.o. Sue Lush Primary Care Bonnie Roig: Demetrius Revel Other Clinician: Referring Ilhan Madan: Treating Azrael Maddix/Extender: Claybon Jabs in Treatment: 10 Visit Information History Since Last Visit Added or deleted any medications: No Patient Arrived: Wheel Chair Any new allergies or adverse reactions: No Arrival Time: 08:27 Had a fall or experienced change in No Accompanied By: son in law activities of daily living that may affect Transfer Assistance: Manual risk of falls: Patient Identification Verified: Yes Signs or symptoms of abuse/neglect since last visito No Secondary Verification Process Completed: Yes Hospitalized since last visit: No Patient Requires Transmission-Based Precautions: No Implantable device outside of the clinic excluding No Patient Has Alerts: Yes cellular tissue based products placed in the center Patient Alerts: R ABI=1.03 TBI=.42 since last visit: L ABI=.97 TBI=.61 Has Dressing in Place as Prescribed: Yes Pain Present Now: No Electronic Signature(s) Signed: 08/08/2021 12:15:18 PM By: Lorrin Jackson Entered By: Lorrin Jackson on 08/08/2021 08:48:16 -------------------------------------------------------------------------------- Encounter Discharge Information Details Patient Name: Date of Service: [name] Baird, [name] 08/08/2021 8:30 A M Medical Record Number: [id number] Patient Account Number: [id number] Date of Birth/Sex: Treating RN: December 14, 1933 (87 y.o. Sue Lush Primary Care Marsel Gail: Demetrius Revel Other Clinician: Referring Gildardo Tickner: Treating Boston Catarino/Extender: Claybon Jabs in Treatment: 10 Encounter Discharge Information Items Post  Procedure Vitals Discharge Condition: Stable Temperature (F): 98.3 Ambulatory Status: Wheelchair Pulse (bpm): 96 Discharge Destination: Home Respiratory Rate (breaths/min): 18 Transportation: Private Auto Blood Pressure (mmHg): 142/85 Accompanied By: son in law Schedule Follow-up Appointment: Yes Clinical Summary of Care: Provided on 08/08/2021 Form Type Recipient Paper Patient Patient Electronic Signature(s) Signed: 08/08/2021 11:15:16 AM By: Lorrin Jackson Entered By: Lorrin Jackson on 08/08/2021 11:15:16 -------------------------------------------------------------------------------- Lower Extremity Assessment Details Patient Name: Date of Service: [name] Baird, [place] S. 08/08/2021 8:30 A M Medical Record Number: [id number] Patient Account Number: [id number] Date of Birth/Sex: Treating RN: [name] Baird (87 y.o. Sue Lush Primary Care Layah Skousen: Demetrius Revel Other Clinician: Referring Monserratt Knezevic: Treating Mickayla Trouten/Extender: Claybon Jabs in Treatment: 10 Edema Assessment Assessed: Shirlyn Goltz: No] Patrice Paradise: Yes] Edema: [Left: Ye] [Right: s] Calf Left: Right: Point of Measurement: 27 cm From Medial Instep 29.8 cm Ankle Left: Right: Point of Measurement: 10 cm From Medial Instep 18 cm Vascular Assessment Pulses: Dorsalis Pedis Palpable: [Right:Yes] Electronic Signature(s) Signed: 08/08/2021 12:15:18 PM By: Lorrin Jackson Entered By: Lorrin Jackson on 08/08/2021 08:35:26 -------------------------------------------------------------------------------- Multi Wound Chart Details Patient Name: Date of Service: [name] Baird, [name] 08/08/2021 8:30 A M Medical Record Number: [id number] Patient Account Number: [id number] Date of Birth/Sex: Treating RN: 1933/04/10 (87 y.o. [name] Baird Primary Care Brightyn Mozer: Demetrius Revel Other Clinician: Referring Musab Wingard: Treating Nihal Marzella/Extender: Claybon Jabs in Treatment: 10 Vital  Signs Height(in): Pulse(bpm): 96 Weight(lbs): Blood Pressure(mmHg): 142/85 Body Mass Index(BMI): Temperature(F): 98.3 Respiratory Rate(breaths/min): 18 Photos: [1:Right, Lateral Malleolus] [N/A:N/A N/A] Wound Location: [1:Trauma] [N/A:N/A] Wounding Event: [1:Venous Leg Ulcer] [N/A:N/A] Primary Etiology: [1:Glaucoma, Arrhythmia, Congestive N/A] Comorbid History: [1:Heart Failure, Hypertension 01/21/2021] [N/A:N/A] Date Acquired: [1:10] [N/A:N/A] Weeks of Treatment: [1:Open] [N/A:N/A] Wound Status: [1:0.2x0.2x0.1] [N/A:N/A] Measurements L x W x D (cm) [1:0.031] [N/A:N/A] A (cm) : rea [1:0.003] [N/A:N/A] Volume (cm) : [1:34.00%] [N/A:N/A] % Reduction in A [1:rea: 66.70%] [N/A:N/A] % Reduction in Volume: [1:Full Thickness Without Exposed] [N/A:N/A] Classification: [1:Support Structures  Medium] [N/A:N/A] Exudate A mount: [1:Serosanguineous] [N/A:N/A] Exudate Type: [1:red, brown] [N/A:N/A] Exudate Color: [1:Distinct, outline attached] [N/A:N/A] Wound Margin: [1:Large (67-100%)] [N/A:N/A] Granulation A mount: [1:Red, Pink] [N/A:N/A] Granulation Quality: [1:Small (1-33%)] [N/A:N/A] Necrotic A mount: [1:Fat Layer (Subcutaneous Tissue): Yes N/A] Exposed Structures: [1:Fascia: No Tendon: No Muscle: No Joint: No Bone: No Medium (34-66%)] [N/A:N/A] Epithelialization: [1:Chemical/Enzymatic/Mechanical] [N/A:N/A] Debridement: Pre-procedure Verification/Time Out 08:50 [N/A:N/A] Taken: [1:N/A] [N/A:N/A] Instrument: [1:None] [N/A:N/A] Bleeding: [1:Procedure was tolerated well] [N/A:N/A] Debridement Treatment Response: [1:0.2x0.2x0.1] [N/A:N/A] Post Debridement Measurements L x W x D (cm) [1:0.003] [N/A:N/A] Post Debridement Volume: (cm) [1:Debridement] [N/A:N/A] Treatment Notes Electronic Signature(s) Signed: 08/08/2021 9:06:35 AM By: Kalman Shan DO Signed: 08/08/2021 12:21:50 PM By: Levan Hurst RN, BSN Entered By: Kalman Shan on 08/08/2021  09:00:27 -------------------------------------------------------------------------------- Multi-Disciplinary Care Plan Details Patient Name: Date of Service: [name], [place] S. 08/08/2021 8:30 A M Medical Record Number: [id number] Patient Account Number: [id number] Date of Birth/Sex: Treating RN: [birth year] (87 y.o. Sue Lush Primary Care Saron Vanorman: Demetrius Revel Other Clinician: Referring Huntley Knoop: Treating Jerry Haugen/Extender: Claybon Jabs in Treatment: 10 Active Inactive Wound/Skin Impairment Nursing Diagnoses: Impaired tissue integrity Goals: Patient/caregiver will verbalize understanding of skin care regimen Date Initiated: 05/30/2021 Date Inactivated: 07/25/2021 Target Resolution Date: 07/25/2021 Goal Status: Met Ulcer/skin breakdown will have a volume reduction of 30% by week 4 Date Initiated: 05/30/2021 Date Inactivated: 06/27/2021 Target Resolution Date: 06/27/2021 Goal Status: Unmet Unmet Reason: non viable tissue Ulcer/skin breakdown will have a volume reduction of 50% by week 8 Date Initiated: 07/11/2021 Date Inactivated: 08/08/2021 Target Resolution Date: 08/08/2021 Goal Status: Met Ulcer/skin breakdown will have a volume reduction of 80% by week 12 Date Initiated: 08/08/2021 Target Resolution Date: 09/04/2021 Goal Status: Active Interventions: Assess patient/caregiver ability to obtain necessary supplies Assess patient/caregiver ability to perform ulcer/skin care regimen upon admission and as needed Assess ulceration(s) every visit Provide education on ulcer and skin care Treatment Activities: Skin care regimen initiated : 05/30/2021 Topical wound management initiated : 05/30/2021 Notes: Electronic Signature(s) Signed: 08/08/2021 12:15:18 PM By: Lorrin Jackson Entered By: Lorrin Jackson on 08/08/2021 08:40:52 -------------------------------------------------------------------------------- Pain Assessment Details Patient Name: Date of  Service: Emeline General NA S. 08/08/2021 8:30 A M Medical Record Number: [id number] Patient Account Number: [id number] Date of Birth/Sex: Treating RN: [birth year] (87 y.o. Sue Lush Primary Care Jones Viviani: Demetrius Revel Other Clinician: Referring Baptiste Littler: Treating Kohan Azizi/Extender: Claybon Jabs in Treatment: 10 Active Problems Location of Pain Severity and Description of Pain Patient Has Paino No Site Locations Pain Management and Medication Current Pain Management: Electronic Signature(s) Signed: 08/08/2021 12:15:18 PM By: Lorrin Jackson Entered By: Lorrin Jackson on 08/08/2021 08:34:10 -------------------------------------------------------------------------------- Patient/Caregiver Education Details Patient Name: Date of Service: Windell Hummingbird 8/19/2022andnbsp8:30 A M Medical Record Number: [id number] Patient Account Number: [id number] Date of Birth/Gender: Treating RN: April 11, 1933 (87 y.o. Sue Lush Primary Care Physician: Demetrius Revel Other Clinician: Referring Physician: Treating Physician/Extender: Claybon Jabs in Treatment: 10 Education Assessment Education Provided To: Patient and Caregiver Education Topics Provided Wound/Skin Impairment: Methods: Demonstration, Explain/Verbal, Printed Responses: State content correctly Electronic Signature(s) Signed: 08/08/2021 12:15:18 PM By: Lorrin Jackson Entered By: Lorrin Jackson on 08/08/2021 08:27:17 -------------------------------------------------------------------------------- Wound Assessment Details Patient Name: Date of Service: [name] Baird, [name] 08/08/2021 8:30 A M Medical Record Number: [id number] Patient Account Number: [id number] Date of Birth/Sex: Treating RN: [birth year] (87 y.o. Sue Lush Primary Care Kyion Gautier: Demetrius Revel Other Clinician: Referring Tenya Araque: Treating Breylin Dom/Extender: Claybon Jabs in Treatment:  10 Wound  Status Wound Number: 1 Primary Etiology: Venous Leg Ulcer Wound Location: Right, Lateral Malleolus Wound Status: Open Wounding Event: Trauma Comorbid Glaucoma, Arrhythmia, Congestive Heart Failure, History: Hypertension Date Acquired: 01/21/2021 Weeks Of Treatment: 10 Clustered Wound: No Photos Wound Measurements Length: (cm) 0.2 Width: (cm) 0.2 Depth: (cm) 0.1 Area: (cm) 0.031 Volume: (cm) 0.003 % Reduction in Area: 34% % Reduction in Volume: 66.7% Epithelialization: Medium (34-66%) Tunneling: No Undermining: No Wound Description Classification: Full Thickness Without Exposed Support Structures Wound Margin: Distinct, outline attached Exudate Amount: Medium Exudate Type: Serosanguineous Exudate Color: red, brown Foul Odor After Cleansing: No Slough/Fibrino Yes Wound Bed Granulation Amount: Large (67-100%) Exposed Structure Granulation Quality: Red, Pink Fascia Exposed: No Necrotic Amount: Small (1-33%) Fat Layer (Subcutaneous Tissue) Exposed: Yes Necrotic Quality: Adherent Slough Tendon Exposed: No Muscle Exposed: No Joint Exposed: No Bone Exposed: No Treatment Notes Wound #1 (Malleolus) Wound Laterality: Right, Lateral Cleanser Soap and Water Discharge Instruction: May shower and wash wound with dial antibacterial soap and water prior to dressing change. Wound Cleanser Discharge Instruction: Cleanse the wound with wound cleanser prior to applying a clean dressing using gauze sponges, not tissue or cotton balls. Peri-Wound Care Skin Prep Discharge Instruction: Use skin prep as directed Topical Primary Dressing PolyMem Silver Non-Adhesive Dressing, 4.25x4.25 in Discharge Instruction: Apply to wound bed as instructed Santyl Ointment Discharge Instruction: Apply nickel thick amount to wound bed, under Hydrofera Secondary Dressing Zetuvit Plus Silicone Border Dressing 5x5 (in/in) Discharge Instruction: Apply silicone border over primary dressing as  directed. Secured With Compression Wrap Compression Stockings Environmental education officer) Signed: 08/08/2021 12:15:18 PM By: Lorrin Jackson Entered By: Lorrin Jackson on 08/08/2021 08:39:39 -------------------------------------------------------------------------------- Vitals Details Patient Name: Date of Service: [name] Baird, [name] 08/08/2021 8:30 A M Medical Record Number: [id number] Patient Account Number: [id number] Date of Birth/Sex: Treating RN: [birth year] (87 y.o. Sue Lush Primary Care Ermina Oberman: Demetrius Revel Other Clinician: Referring Kaye Mitro: Treating Fleet Higham/Extender: Claybon Jabs in Treatment: 10 Vital Signs Time Taken: 08:33 Temperature (F): 98.3 Pulse (bpm): 96 Respiratory Rate (breaths/min): 18 Blood Pressure (mmHg): 142/85 Reference Range: 80 - 120 mg / dl Electronic Signature(s) Signed: 08/08/2021 12:15:18 PM By: Lorrin Jackson Entered By: Lorrin Jackson on 08/08/2021 08:33:40

## 2021-08-20 ENCOUNTER — Other Ambulatory Visit: Payer: Self-pay | Admitting: Cardiovascular Disease

## 2021-08-21 ENCOUNTER — Other Ambulatory Visit: Payer: Self-pay | Admitting: Nurse Practitioner

## 2021-08-21 DIAGNOSIS — E7800 Pure hypercholesterolemia, unspecified: Secondary | ICD-10-CM

## 2021-08-22 ENCOUNTER — Encounter (HOSPITAL_BASED_OUTPATIENT_CLINIC_OR_DEPARTMENT_OTHER): Payer: Medicare Other | Attending: Internal Medicine | Admitting: Internal Medicine

## 2021-08-22 ENCOUNTER — Other Ambulatory Visit: Payer: Self-pay

## 2021-08-22 DIAGNOSIS — Z8774 Personal history of (corrected) congenital malformations of heart and circulatory system: Secondary | ICD-10-CM | POA: Diagnosis not present

## 2021-08-22 DIAGNOSIS — I509 Heart failure, unspecified: Secondary | ICD-10-CM | POA: Insufficient documentation

## 2021-08-22 DIAGNOSIS — L97319 Non-pressure chronic ulcer of right ankle with unspecified severity: Secondary | ICD-10-CM | POA: Diagnosis not present

## 2021-08-22 DIAGNOSIS — Z950 Presence of cardiac pacemaker: Secondary | ICD-10-CM | POA: Insufficient documentation

## 2021-08-22 DIAGNOSIS — I872 Venous insufficiency (chronic) (peripheral): Secondary | ICD-10-CM | POA: Diagnosis not present

## 2021-08-22 DIAGNOSIS — Z7901 Long term (current) use of anticoagulants: Secondary | ICD-10-CM | POA: Diagnosis not present

## 2021-08-22 DIAGNOSIS — I480 Paroxysmal atrial fibrillation: Secondary | ICD-10-CM | POA: Insufficient documentation

## 2021-08-22 DIAGNOSIS — I110 Hypertensive heart disease with heart failure: Secondary | ICD-10-CM | POA: Diagnosis not present

## 2021-08-22 NOTE — Progress Notes (Signed)
AISHI, COURTS ([id number]) Visit Report for 08/22/2021 Arrival Information Details Patient Name: Date of Service: [place], [name] 08/22/2021 8:30 A M Medical Record Number: [id number] Patient Account Number: [id number] Date of Birth/Sex: Treating RN: 1933/05/11 (87 y.o. Tonita Phoenix, Lauren Primary Care Ixchel Duck: Demetrius Revel Other Clinician: Referring Dorthea Maina: Treating Romey Mathieson/Extender: Claybon Jabs in Treatment: 12 Visit Information History Since Last Visit Added or deleted any medications: No Patient Arrived: Wheel Chair Any new allergies or adverse reactions: No Arrival Time: 08:32 Had a fall or experienced change in No Accompanied By: daughter activities of daily living that may affect Transfer Assistance: None risk of falls: Patient Identification Verified: Yes Signs or symptoms of abuse/neglect since last visito No Secondary Verification Process Completed: Yes Hospitalized since last visit: No Patient Requires Transmission-Based Precautions: No Implantable device outside of the clinic excluding No Patient Has Alerts: Yes cellular tissue based products placed in the center Patient Alerts: R ABI=1.03 TBI=.42 since last visit: L ABI=.97 TBI=.61 Has Dressing in Place as Prescribed: Yes Pain Present Now: No Electronic Signature(s) Signed: 08/22/2021 11:30:35 AM By: Rhae Hammock RN Entered By: Rhae Hammock on 08/22/2021 08:33:38 -------------------------------------------------------------------------------- Clinic Level of Care Assessment Details Patient Name: Date of Service: [place], [place] S. 08/22/2021 8:30 A M Medical Record Number: [id number] Patient Account Number: [id number] Date of Birth/Sex: Treating RN: 23-Oct-1933 (87 y.o. Tonita Phoenix, Lauren Primary Care Johnell Bas: Demetrius Revel Other Clinician: Referring Shanyah Gattuso: Treating Ramiel Forti/Extender: Claybon Jabs in Treatment: 12 Clinic Level of Care Assessment  Items TOOL 4 Quantity Score X- 1 0 Use when only an EandM is performed on FOLLOW-UP visit ASSESSMENTS - Nursing Assessment / Reassessment X- 1 10 Reassessment of Co-morbidities (includes updates in patient status) X- 1 5 Reassessment of Adherence to Treatment Plan ASSESSMENTS - Wound and Skin A ssessment / Reassessment X - Simple Wound Assessment / Reassessment - one wound 1 5 []  - 0 Complex Wound Assessment / Reassessment - multiple wounds X- 1 10 Dermatologic / Skin Assessment (not related to wound area) ASSESSMENTS - Focused Assessment []  - 0 Circumferential Edema Measurements - multi extremities []  - 0 Nutritional Assessment / Counseling / Intervention []  - 0 Lower Extremity Assessment (monofilament, tuning fork, pulses) []  - 0 Peripheral Arterial Disease Assessment (using hand held doppler) ASSESSMENTS - Ostomy and/or Continence Assessment and Care []  - 0 Incontinence Assessment and Management []  - 0 Ostomy Care Assessment and Management (repouching, etc.) PROCESS - Coordination of Care X - Simple Patient / Family Education for ongoing care 1 15 []  - 0 Complex (extensive) Patient / Family Education for ongoing care X- 1 10 Staff obtains Programmer, systems, Records, T Results / Process Orders est []  - 0 Staff telephones HHA, Nursing Homes / Clarify orders / etc []  - 0 Routine Transfer to another Facility (non-emergent condition) []  - 0 Routine Hospital Admission (non-emergent condition) []  - 0 New Admissions / Biomedical engineer / Ordering NPWT Apligraf, etc. , []  - 0 Emergency Hospital Admission (emergent condition) X- 1 10 Simple Discharge Coordination []  - 0 Complex (extensive) Discharge Coordination PROCESS - Special Needs []  - 0 Pediatric / Minor Patient Management []  - 0 Isolation Patient Management []  - 0 Hearing / Language / Visual special needs []  - 0 Assessment of Community assistance (transportation, D/C planning, etc.) []  - 0 Additional  assistance / Altered mentation []  - 0 Support Surface(s) Assessment (bed, cushion, seat, etc.) INTERVENTIONS - Wound Cleansing / Measurement X - Simple Wound Cleansing - one wound 1  5 []  - 0 Complex Wound Cleansing - multiple wounds X- 1 5 Wound Imaging (photographs - any number of wounds) []  - 0 Wound Tracing (instead of photographs) X- 1 5 Simple Wound Measurement - one wound []  - 0 Complex Wound Measurement - multiple wounds INTERVENTIONS - Wound Dressings X - Small Wound Dressing one or multiple wounds 1 10 []  - 0 Medium Wound Dressing one or multiple wounds []  - 0 Large Wound Dressing one or multiple wounds X- 1 5 Application of Medications - topical []  - 0 Application of Medications - injection INTERVENTIONS - Miscellaneous []  - 0 External ear exam []  - 0 Specimen Collection (cultures, biopsies, blood, body fluids, etc.) []  - 0 Specimen(s) / Culture(s) sent or taken to Lab for analysis []  - 0 Patient Transfer (multiple staff / Civil Service fast streamer / Similar devices) []  - 0 Simple Staple / Suture removal (25 or less) []  - 0 Complex Staple / Suture removal (26 or more) []  - 0 Hypo / Hyperglycemic Management (close monitor of Blood Glucose) []  - 0 Ankle / Brachial Index (ABI) - do not check if billed separately X- 1 5 Vital Signs Has the patient been seen at the hospital within the last three years: Yes Total Score: 100 Level Of Care: New/Established - Level 3 Electronic Signature(s) Signed: 08/22/2021 11:30:35 AM By: Rhae Hammock RN Entered By: Rhae Hammock on 08/22/2021 09:00:47 -------------------------------------------------------------------------------- Encounter Discharge Information Details Patient Name: Date of Service: [name], [name] 08/22/2021 8:30 A M Medical Record Number: [id number] Patient Account Number: [id number] Date of Birth/Sex: Treating RN: [birth year] (87 y.o. Tonita Phoenix, Lauren Primary Care Destani Wamser: Demetrius Revel Other  Clinician: Referring Artemisia Auvil: Treating Berton Butrick/Extender: Claybon Jabs in Treatment: 12 Encounter Discharge Information Items Discharge Condition: Stable Ambulatory Status: Wheelchair Discharge Destination: Home Transportation: Private Auto Accompanied By: daughter Schedule Follow-up Appointment: Yes Clinical Summary of Care: Patient Declined Electronic Signature(s) Signed: 08/22/2021 11:30:35 AM By: Rhae Hammock RN Entered By: Rhae Hammock on 08/22/2021 10:55:17 -------------------------------------------------------------------------------- Lower Extremity Assessment Details Patient Name: Date of Service: [facility] [place] S. 08/22/2021 8:30 A M Medical Record Number: [id number] Patient Account Number: [id number] Date of Birth/Sex: Treating RN: 07-May-1933 (87 y.o. Tonita Phoenix, Lauren Primary Care Mahalia Dykes: Demetrius Revel Other Clinician: Referring Jacqueleen Pulver: Treating Kennedi Lizardo/Extender: Claybon Jabs in Treatment: 12 Edema Assessment Assessed: Shirlyn Goltz: No] Patrice Paradise: Yes] Edema: [Left: Ye] [Right: s] Calf Left: Right: Point of Measurement: 27 cm From Medial Instep 29.8 cm Ankle Left: Right: Point of Measurement: 10 cm From Medial Instep 18 cm Vascular Assessment Pulses: Dorsalis Pedis Palpable: [Right:Yes] Posterior Tibial Palpable: [Right:Yes] Electronic Signature(s) Signed: 08/22/2021 11:30:35 AM By: Rhae Hammock RN Entered By: Rhae Hammock on 08/22/2021 08:34:28 -------------------------------------------------------------------------------- Multi Wound Chart Details Patient Name: Date of Service: [name], [name] 08/22/2021 8:30 A M Medical Record Number: [id number] Patient Account Number: [id number] Date of Birth/Sex: Treating RN: 09-Oct-1933 (87 y.o. Elam Dutch Primary Care Marbeth Smedley: Demetrius Revel Other Clinician: Referring Bea Duren: Treating Deklan Minar/Extender: Claybon Jabs in  Treatment: 12 Vital Signs Height(in): Pulse(bpm): 75 Weight(lbs): Blood Pressure(mmHg): 122/78 Body Mass Index(BMI): Temperature(F): 98.3 Respiratory Rate(breaths/min): 17 Photos: [1:No Photos] [N/A:N/A] Right, Lateral Malleolus Right, Lateral Malleolus N/A Wound Location: Trauma Trauma N/A Wounding Event: Venous Leg Ulcer Venous Leg Ulcer N/A Primary Etiology: Glaucoma, Arrhythmia, Congestive Glaucoma, Arrhythmia, Congestive N/A Comorbid History: Heart Failure, Hypertension Heart Failure, Hypertension 01/21/2021 01/21/2021 N/A Date Acquired: 12 12 N/A Weeks of Treatment: Open Healed - Epithelialized N/A Wound Status: 0.1x0.1x0.1 0.1x0.1x0.1  N/A Measurements L x W x D (cm) 0.008 0 N/A A (cm) : rea 0.001 0 N/A Volume (cm) : 83.00% 100.00% N/A % Reduction in Area: 88.90% 100.00% N/A % Reduction in Volume: Full Thickness Without Exposed Full Thickness Without Exposed N/A Classification: Support Structures Support Structures Medium Medium N/A Exudate Amount: Serosanguineous Serosanguineous N/A Exudate Type: red, brown red, brown N/A Exudate Color: Distinct, outline attached N/A N/A Wound Margin: Large (67-100%) N/A N/A Granulation Amount: Red, Pink N/A N/A Granulation Quality: None Present (0%) N/A N/A Necrotic Amount: Fat Layer (Subcutaneous Tissue): Yes N/A N/A Exposed Structures: Fascia: No Tendon: No Muscle: No Joint: No Bone: No Medium (34-66%) N/A N/A Epithelialization: Treatment Notes Wound #1 (Malleolus) Wound Laterality: Right, Lateral Cleanser Soap and Water Discharge Instruction: May shower and wash wound with dial antibacterial soap and water prior to dressing change. Wound Cleanser Discharge Instruction: Cleanse the wound with wound cleanser prior to applying a clean dressing using gauze sponges, not tissue or cotton balls. Peri-Wound Care Skin Prep Discharge Instruction: Use skin prep as directed Topical Primary Dressing Santyl  Ointment Discharge Instruction: Apply nickel thick amount to wound bed, under Hydrofera Secondary Dressing Zetuvit Plus Silicone Border Dressing 5x5 (in/in) Discharge Instruction: Apply silicone border over primary dressing as directed. Secured With Compression Wrap Compression Stockings Environmental education officer) Signed: 08/22/2021 9:10:55 AM By: Kalman Shan DO Signed: 08/22/2021 12:11:16 PM By: Baruch Gouty RN, BSN Entered By: Kalman Shan on 08/22/2021 09:07:08 -------------------------------------------------------------------------------- Multi-Disciplinary Care Plan Details Patient Name: Date of Service: [place], [place] S. 08/22/2021 8:30 A M Medical Record Number: [id number] Patient Account Number: [id number] Date of Birth/Sex: Treating RN: 1933-04-18 (87 y.o. Tonita Phoenix, Lauren Primary Care Hilda Wexler: Demetrius Revel Other Clinician: Referring Gloyd Happ: Treating Makeyla Govan/Extender: Claybon Jabs in Treatment: 12 Active Inactive Wound/Skin Impairment Nursing Diagnoses: Impaired tissue integrity Goals: Patient/caregiver will verbalize understanding of skin care regimen Date Initiated: 05/30/2021 Date Inactivated: 07/25/2021 Target Resolution Date: 07/25/2021 Goal Status: Met Ulcer/skin breakdown will have a volume reduction of 30% by week 4 Date Initiated: 05/30/2021 Date Inactivated: 06/27/2021 Target Resolution Date: 06/27/2021 Goal Status: Unmet Unmet Reason: non viable tissue Ulcer/skin breakdown will have a volume reduction of 50% by week 8 Date Initiated: 07/11/2021 Date Inactivated: 08/08/2021 Target Resolution Date: 08/08/2021 Goal Status: Met Ulcer/skin breakdown will have a volume reduction of 80% by week 12 Date Initiated: 08/08/2021 Target Resolution Date: 09/04/2021 Goal Status: Active Interventions: Assess patient/caregiver ability to obtain necessary supplies Assess patient/caregiver ability to perform ulcer/skin care regimen upon  admission and as needed Assess ulceration(s) every visit Provide education on ulcer and skin care Treatment Activities: Skin care regimen initiated : 05/30/2021 Topical wound management initiated : 05/30/2021 Notes: Electronic Signature(s) Signed: 08/22/2021 11:30:35 AM By: Rhae Hammock RN Entered By: Rhae Hammock on 08/22/2021 08:35:16 -------------------------------------------------------------------------------- Pain Assessment Details Patient Name: Date of Service: [facility] [place] S. 08/22/2021 8:30 A M Medical Record Number: [id number] Patient Account Number: [id number] Date of Birth/Sex: Treating RN: 05/19/1933 (87 y.o. Tonita Phoenix, Lauren Primary Care Lilliah Priego: Demetrius Revel Other Clinician: Referring Dorothy Landgrebe: Treating Cam Dauphin/Extender: Claybon Jabs in Treatment: 12 Active Problems Location of Pain Severity and Description of Pain Patient Has Paino No Site Locations Pain Management and Medication Current Pain Management: Electronic Signature(s) Signed: 08/22/2021 11:30:35 AM By: Rhae Hammock RN Entered By: Rhae Hammock on 08/22/2021 08:34:14 -------------------------------------------------------------------------------- Patient/Caregiver Education Details Patient Name: Date of Service: [facility] Hum[facility] 9/2/2022andnbsp8:30 [place] Record Number: [id number] Patient Account Number: [id number] Date of  Birth/Gender: Treating RN: 1933/07/13 (87 y.o. Tonita Phoenix, Lauren Primary Care Physician: Demetrius Revel Other Clinician: Referring Physician: Treating Physician/Extender: Claybon Jabs in Treatment: 12 Education Assessment Education Provided To: Patient Education Topics Provided Wound/Skin Impairment: Methods: Explain/Verbal Responses: State content correctly [employer]) Signed: 08/22/2021 11:30:35 AM By: Rhae Hammock RN Entered By: Rhae Hammock on 08/22/2021  08:35:30 -------------------------------------------------------------------------------- Wound Assessment Details Patient Name: Date of Service: [facility] NA S. 08/22/2021 8:30 A M Medical Record Number: [id number] Patient Account Number: [id number] Date of Birth/Sex: Treating RN: 20-Jul-1933 (87 y.o. Tonita Phoenix, Lauren Primary Care Kortland Nichols: Demetrius Revel Other Clinician: Referring Vonetta Foulk: Treating Heinrich Fertig/Extender: Claybon Jabs in Treatment: 12 Wound Status Wound Number: 1 Primary Etiology: Venous Leg Ulcer Wound Location: Right, Lateral Malleolus Wound Status: Open Wounding Event: Trauma Comorbid Glaucoma, Arrhythmia, Congestive Heart Failure, History: Hypertension Date Acquired: 01/21/2021 Weeks Of Treatment: 12 Clustered Wound: No Wound Measurements Length: (cm) 0.1 Width: (cm) 0.1 Depth: (cm) 0.1 Area: (cm) 0.008 Volume: (cm) 0.001 % Reduction in Area: 83% % Reduction in Volume: 88.9% Epithelialization: Medium (34-66%) Tunneling: No Undermining: No Wound Description Classification: Full Thickness Without Exposed Support Structures Wound Margin: Distinct, outline attached Exudate Amount: Medium Exudate Type: Serosanguineous Exudate Color: red, brown Foul Odor After Cleansing: No Slough/Fibrino No Wound Bed Granulation Amount: Large (67-100%) Exposed Structure Granulation Quality: Red, Pink Fascia Exposed: No Necrotic Amount: None Present (0%) Fat Layer (Subcutaneous Tissue) Exposed: Yes Tendon Exposed: No Muscle Exposed: No Joint Exposed: No Bone Exposed: No Electronic Signature(s) Signed: 08/22/2021 11:30:35 AM By: Rhae Hammock RN Entered By: Rhae Hammock on 08/22/2021 08:34:56 -------------------------------------------------------------------------------- Wound Assessment Details Patient Name: Date of Service: [name], [name] 08/22/2021 8:30 A M Medical Record Number: [id number] Patient Account Number:  [id number] Date of Birth/Sex: Treating RN: 1933/01/26 (87 y.o. Tonita Phoenix, Lauren Primary Care Makelle Marrone: Demetrius Revel Other Clinician: Referring Jontavius Rabalais: Treating Viki Carrera/Extender: Claybon Jabs in Treatment: 12 Wound Status Wound Number: 1 Primary Etiology: Venous Leg Ulcer Wound Location: Right, Lateral Malleolus Wound Status: Healed - Epithelialized Wounding Event: Trauma Comorbid Glaucoma, Arrhythmia, Congestive Heart Failure, History: Hypertension Date Acquired: 01/21/2021 Weeks Of Treatment: 12 Clustered Wound: No Photos Wound Measurements Length: (cm) 0.1 Width: (cm) 0.1 Depth: (cm) 0.1 Area: (cm) 0 Volume: (cm) 0 % Reduction in Area: 100% % Reduction in Volume: 100% Wound Description Classification: Full Thickness Without Exposed Support Structu Exudate Amount: Medium Exudate Type: Serosanguineous Exudate Color: red, brown res Electronic Signature(s) Signed: 08/22/2021 11:30:35 AM By: Rhae Hammock RN Entered By: Rhae Hammock on 08/22/2021 08:58:15 -------------------------------------------------------------------------------- Vitals Details Patient Name: Date of Service: [name], [name] 08/22/2021 8:30 A M Medical Record Number: [id number] Patient Account Number: [id number] Date of Birth/Sex: Treating RN: 08-Jan-1933 (87 y.o. Tonita Phoenix, Lauren Primary Care Calix Heinbaugh: Demetrius Revel Other Clinician: Referring Jhoan Schmieder: Treating Greg Eckrich/Extender: Claybon Jabs in Treatment: 12 Vital Signs Time Taken: 08:33 Temperature (F): 98.3 Pulse (bpm): 75 Respiratory Rate (breaths/min): 17 Blood Pressure (mmHg): 122/78 Reference Range: 80 - 120 mg / dl Electronic Signature(s) Signed: 08/22/2021 11:30:35 AM By: Rhae Hammock RN Entered By: Rhae Hammock on 08/22/2021 08:34:07

## 2021-08-22 NOTE — Progress Notes (Addendum)
JARAH, PEMBER ([id number]) Visit Report for 08/22/2021 Chief Complaint Document Details Patient Name: Date of Service: [place], [name] 08/22/2021 8:30 A M Medical Record Number: [id number] Patient Account Number: [id number] Date of Birth/Sex: Treating Baird: February 10, 1933 (87 y.o. Tommye Standard Primary Care Provider: Bjorn Pippin Other Clinician: Referring Provider: Treating Provider/Extender: Kathyrn Drown in Treatment: 12 Information Obtained from: Patient Chief Complaint Right ankle wound Electronic Signature(s) Signed: 08/22/2021 9:10:55 AM By: Geralyn Corwin DO Entered By: Geralyn Corwin on 08/22/2021 09:07:16 -------------------------------------------------------------------------------- HPI Details Patient Name: Date of Service: [name] Baird, [name] 08/22/2021 8:30 A M Medical Record Number: [id number] Patient Account Number: [id number] Date of Birth/Sex: Treating Baird: July 10, 1933 (87 y.o. Tommye Standard Primary Care Provider: Bjorn Pippin Other Clinician: Referring Provider: Treating Provider/Extender: Kathyrn Drown in Treatment: 12 History of Present Illness HPI Description: Admission 6/10 [name] Baird is an 87 year old female with a past medical history of paroxysmal A. fib on anticoagulation, congenital heart disease (atrial septal defect status post patch closure), pulmonic stenosis (status post valvotomy 1985), left pulmonary artery aneurysm, complete heart block status post dual-chamber permanent pacemaker implantation that presents to the clinic for a right lateral malleolus wound that developed 3 months ago. She is not quite sure how it started but thinks she may have hit it against her walker. She has been placing Neosporin on this wound daily. She reports pain to the area worse at night and often finds herself laying her leg over the side of the bed. She has been given doxycycline for this issue by her podiatrist. She  currently denies signs of infection. She reports trying to offload the area the best she can daily. 6/24; patient presents for 2-week follow-up. She has been using collagen and activating it with K-Y jelly. She has tolerated this well. She continues to have nocturnal pain at the wound site. She is scheduled to see vein and vascular on 7/6. She denies signs of infection. 7/8; patient presents for 2-week follow-up. She has been using collagen to the wound. She has no issues or complaints today. She did see Dr. Edilia Bo with vein and vascular and was told she had adequate blood flow for wound healing. She denies signs of infection. 7/15; patient presents for 1 week follow-up. She has tolerated the compression wrap. We changed the dressing last week to Santyl and Hydrofera Blue. She has no issues or complaints today. She denies signs of infection. 7/22; patient presents for 1 week follow-up. She continues to tolerate the compression wrap well. We were using Hydrofera Blue and Santyl under this. She has no issues or complaints today. She thinks the wound has improved slightly over the past week. She denies signs of infection. 8/5; patient presents for follow-up. She has been using Santyl to the wound bed daily. She has noticed improvement in nocturnal pain over the past week. She denies signs of infection. 8/19; patient presents for follow-up. She continues to use Santyl to the wound beds daily. She has no issues or complaints today. She denies signs of infection. 9/2; patient presents for follow-up. She has been using Santyl with PolyMem silver to the wound bed. She has no issues or complaints today. She denies signs of infection. Electronic Signature(s) Signed: 08/22/2021 9:10:55 AM By: Geralyn Corwin DO Entered By: Geralyn Corwin on 08/22/2021 09:07:38 -------------------------------------------------------------------------------- Physical Exam Details Patient Name: Date of Service: [name] Baird, [place]  [place] S. 08/22/2021 8:30 A M Medical Record Number: [id number] Patient Account Number: [id number] Date  of Birth/Sex: Treating Baird: 10-13-1933 (87 y.o. Tommye Standard Primary Care Provider: Bjorn Pippin Other Clinician: Referring Provider: Treating Provider/Extender: Kathyrn Drown in Treatment: 12 Constitutional respirations regular, non-labored and within target range for patient.. Cardiovascular 2+ dorsalis pedis/posterior tibialis pulses. Psychiatric pleasant and cooperative. Notes Right ankle: Lateral malleolus with nonviable tissue appears mostly scabbed. No signs of infection. Electronic Signature(s) Signed: 08/22/2021 9:10:55 AM By: Geralyn Corwin DO Entered By: Geralyn Corwin on 08/22/2021 09:08:13 -------------------------------------------------------------------------------- Physician Orders Details Patient Name: Date of Service: [name] B[place], [name] 08/22/2021 8:30 A M Medical Record Number: [id number] Patient Account Number: [id number] Date of Birth/Sex: Treating Baird: 12/09/1933 (87 y.o. 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 - with Dr. Mikey Bussing Return A Bathing/ Shower/ Hygiene May shower and wash wound with soap and water. Edema Control - Lymphedema / SCD / Other Elevate legs to the level of the heart or above for 30 minutes daily and/or when sitting, a frequency of: - throughout the day Avoid standing for long  periods of time. Exercise regularly Moisturize legs daily. Additional Orders / Instructions Follow Nutritious Diet Wound Treatment Wound #1 - Malleolus Wound Laterality: Right, Lateral Cleanser: Soap and Water 1 x Per Day/30 Days Discharge Instructions: May shower and wash wound with dial antibacterial soap and water prior to dressing change. Cleanser: Wound Cleanser (DME) (Generic) 1 x Per Day/30 Days Discharge Instructions: Cleanse the wound with wound cleanser prior to applying a clean dressing using gauze sponges, not tissue or cotton balls. Peri-Wound Care: Skin Prep (DME) (Generic) 1 x Per Day/30 Days Discharge Instructions: Use skin prep as directed Prim Dressing: Santyl Ointment 1 x Per Day/30 Days ary Discharge Instructions: Apply nickel thick amount to wound bed, under Hydrofera Secondary Dressing: Zetuvit Plus Silicone Border Dressing 5x5 (in/in) (DME) (Generic) 1 x Per Day/30 Days Discharge Instructions: Apply silicone border over primary dressing as directed. Electronic Signature(s) Signed: 09/17/2021 2:00:36 PM By: Geralyn Corwin DO Signed: 01/08/2022 12:39:41 PM By: Fonnie Mu Baird Previous Signature: 08/22/2021 9:10:55 AM Version By: Geralyn Corwin DO Entered By: Fonnie Mu on 09/02/2021 13:48:14 -------------------------------------------------------------------------------- Problem List Details Patient Name: Date of Service: [place], [place] S. 08/22/2021 8:30 A M Medical Record Number: [id number] Patient Account Number: [id number] Date of Birth/Sex: Treating Baird: 1933-06-07 (87 y.o. 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 05/30/2021 No Yes I48.0 Paroxysmal atrial  fibrillation 05/30/2021 No Yes Z79.01 Long term (current) use of anticoagulants 05/30/2021 No Yes Z95.0 Presence of cardiac pacemaker 05/30/2021 No Yes M79.606 Pain in leg, unspecified 05/30/2021 No Yes I87.2 Venous insufficiency (chronic) (peripheral) 07/11/2021 No Yes Inactive Problems Resolved Problems Electronic Signature(s) Signed: 08/22/2021 9:10:55 AM By: Geralyn Corwin DO Entered By: Geralyn Corwin on 08/22/2021 09:07:02 -------------------------------------------------------------------------------- Progress Note Details Patient Name: Date of Service: [name] Baird, [name] 08/22/2021 8:30 A M Medical Record Number: [id number] Patient Account Number: [id number] Date of Birth/Sex: Treating Baird: May 16, 1933 (88  y.o. Tommye Standard Primary Care Provider: Bjorn Pippin Other Clinician: Referring Provider: Treating Provider/Extender: Kathyrn Drown in Treatment: 12 Subjective Chief Complaint Information obtained from Patient Right ankle wound History of Present Illness (HPI) Admission 6/10 [name] is an 87 year old female with a past medical history of paroxysmal A. fib on anticoagulation, congenital heart disease (atrial septal defect status post patch closure), pulmonic stenosis (status post valvotomy 1985), left pulmonary artery aneurysm, complete heart block status post dual-chamber permanent pacemaker implantation that presents to the clinic for a right lateral malleolus wound that developed 3 months ago. She is not quite sure how it started but thinks she may have hit it against her walker. She has been placing Neosporin on this wound daily. She reports pain to the area worse at night and often finds herself laying her leg over the side of the bed. She has been given doxycycline for this issue by her podiatrist. She currently denies signs of infection. She reports trying to offload the area the best she can daily. 6/24; patient presents for 2-week  follow-up. She has been using collagen and activating it with K-Y jelly. She has tolerated this well. She continues to have nocturnal pain at the wound site. She is scheduled to see vein and vascular on 7/6. She denies signs of infection. 7/8; patient presents for 2-week follow-up. She has been using collagen to the wound. She has no issues or complaints today. She did see Dr. Edilia Bo with vein and vascular and was told she had adequate blood flow for wound healing. She denies signs of infection. 7/15; patient presents for 1 week follow-up. She has tolerated the compression wrap. We changed the dressing last week to Santyl and Hydrofera Blue. She has no issues or complaints today. She denies signs of infection. 7/22; patient presents for 1 week follow-up. She continues to tolerate the compression wrap well. We were using Hydrofera Blue and Santyl under this. She has no issues or complaints today. She thinks the wound has improved slightly over the past week. She denies signs of infection. 8/5; patient presents for follow-up. She has been using Santyl to the wound bed daily. She has noticed improvement in nocturnal pain over the past week. She denies signs of infection. 8/19; patient presents for follow-up. She continues to use Santyl to the wound beds daily. She has no issues or complaints today. She denies signs of infection. 9/2; patient presents for follow-up. She has been using Santyl with PolyMem silver to the wound bed. She has no issues or complaints today. She denies signs of infection. Patient History Information obtained from Patient. Family History Unknown History. Social History Never smoker, Marital Status - Married, Alcohol Use - Never, Drug Use - No History, Caffeine Use - Rarely. Medical History Eyes Patient has history of Glaucoma Denies history of Cataracts, Optic Neuritis Ear/Nose/Mouth/Throat Denies history of Chronic sinus problems/congestion, Middle ear  problems Hematologic/Lymphatic Denies history of Anemia, Hemophilia, Human Immunodeficiency Virus, Lymphedema, Sickle Cell Disease Respiratory Denies history of Aspiration, Asthma, Chronic Obstructive Pulmonary Disease (COPD), Pneumothorax, Sleep Apnea, Tuberculosis Cardiovascular Patient has history of Arrhythmia - atrial flutter, Congestive Heart Failure, Hypertension Denies history of Angina, Coronary Artery Disease, Deep Vein Thrombosis, Hypotension, Myocardial Infarction, Peripheral Arterial Disease, Peripheral Venous Disease, Phlebitis, Vasculitis Gastrointestinal Denies history of Cirrhosis , Colitis, Crohnoos, Hepatitis A, Hepatitis B, Hepatitis C Endocrine Denies history of Type I Diabetes, Type II Diabetes Genitourinary Denies history of End Stage Renal Disease Immunological Denies history of Lupus  Erythematosus, Raynaudoos, Scleroderma Integumentary (Skin) Denies history of History of Burn Musculoskeletal Denies history of Gout, Rheumatoid Arthritis, Osteoarthritis, Osteomyelitis Neurologic Denies history of Dementia, Neuropathy, Quadriplegia, Paraplegia, Seizure Disorder Oncologic Denies history of Received Chemotherapy, Received Radiation Psychiatric Denies history of Anorexia/bulimia, Confinement Anxiety Medical A Surgical History Notes nd Cardiovascular complete heart block s/p medtronic pacemaker status post patch cllosure of ASD Psychiatric anxiety and depression Objective Constitutional respirations regular, non-labored and within target range for patient.. Vitals Time Taken: 8:33 AM, Temperature: 98.3 F, Pulse: 75 bpm, Respiratory Rate: 17 breaths/min, Blood Pressure: 122/78 mmHg. Cardiovascular 2+ dorsalis pedis/posterior tibialis pulses. Psychiatric pleasant and cooperative. General Notes: Right ankle: Lateral malleolus with nonviable tissue appears mostly scabbed. No signs of infection. Integumentary (Hair, Skin) Wound #1 status is Open. Original  cause of wound was Trauma. The date acquired was: 01/21/2021. The wound has been in treatment 12 weeks. The wound is located on the Right,Lateral Malleolus. The wound measures 0.1cm length x 0.1cm width x 0.1cm depth; 0.008cm^2 area and 0.001cm^3 volume. There is Fat Layer (Subcutaneous Tissue) exposed. There is no tunneling or undermining noted. There is a medium amount of serosanguineous drainage noted. The wound margin is distinct with the outline attached to the wound base. There is large (67-100%) red, pink granulation within the wound bed. There is no necrotic tissue within the wound bed. Wound #1 status is Healed - Epithelialized. Original cause of wound was Trauma. The date acquired was: 01/21/2021. The wound has been in treatment 12 weeks. The wound is located on the Right,Lateral Malleolus. The wound measures 0.1cm length x 0.1cm width x 0.1cm depth; 0cm^2 area and 0cm^3 volume. There is a medium amount of serosanguineous drainage noted. 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 No signs of infection. I still recommended Santyl to this daily. She no longer needs to use PolyMem silver unless there is a open surface for it. This may happen if the scab comes off. I recommended keeping the area covered and protected. Plan Follow-up Appointments: Return Appointment in 2 weeks. - with Dr. Mikey Bussing Bathing/ Shower/ Hygiene: May shower and wash wound with soap and water. Edema Control - Lymphedema / SCD / Other: Elevate legs to the level of the heart or above for 30 minutes daily and/or when sitting, a frequency of: - throughout the day Avoid standing for long periods of time. Exercise regularly Moisturize legs  daily. Additional Orders / Instructions: Follow Nutritious Diet WOUND #1: - Malleolus Wound Laterality: Right, Lateral Cleanser: Soap and Water 1 x Per Day/30 Days Discharge Instructions: May shower and wash wound with dial antibacterial soap and water prior to dressing change. Cleanser: Wound Cleanser (DME) (Generic) 1 x Per Day/30 Days Discharge Instructions: Cleanse the wound with wound cleanser prior to applying a clean dressing using gauze sponges, not tissue or cotton balls. Peri-Wound Care: Skin Prep (DME) (Generic) 1 x Per Day/30 Days Discharge Instructions: Use skin prep as directed Prim Dressing: Santyl Ointment 1 x Per Day/30 Days ary Discharge Instructions: Apply nickel thick amount to wound bed, under Hydrofera Secondary Dressing: Zetuvit Plus Silicone Border Dressing 5x5 (in/in) (DME) (Generic) 1 x Per Day/30 Days Discharge Instructions: Apply silicone border over primary dressing as directed. 1. Santyl daily 2. Foam border dressing 3. Follow-up in 2 weeks Electronic Signature(s) Signed: 08/22/2021 9:10:55 AM  By: Geralyn CorwinHoffman, Denzil Mceachron DO Entered By: Geralyn CorwinHoffman, Metha Kolasa on 08/22/2021 09:09:57 -------------------------------------------------------------------------------- HxROS Details Patient Name: Date of Service: [name], [name] 08/22/2021 8:30 A M Medical Record Number: [id number] Patient Account Number: [id number] Date of Birth/Sex: Treating Baird: Mar 02, 1933 [age] y.o. Tommye StandardF) Baird, [name] Primary Care Provider: Bjorn PippinGray, Joseph Other Clinician: Referring Provider: Treating Provider/Extender: Kathyrn DrownHoffman, Robt Okuda Gray, Joseph Weeks in Treatment: 12 Information Obtained From Patient Eyes Medical History: Positive for: Glaucoma Negative for: Cataracts; Optic Neuritis Ear/Nose/Mouth/Throat Medical History: Negative for: Chronic sinus problems/congestion; Middle ear problems Hematologic/Lymphatic Medical History: Negative for: Anemia; Hemophilia; Human Immunodeficiency Virus;  Lymphedema; Sickle Cell Disease Respiratory Medical History: Negative for: Aspiration; Asthma; Chronic Obstructive Pulmonary Disease (COPD); Pneumothorax; Sleep Apnea; Tuberculosis Cardiovascular Medical History: Positive for: Arrhythmia - atrial flutter; Congestive Heart Failure; Hypertension Negative for: Angina; Coronary Artery Disease; Deep Vein Thrombosis; Hypotension; Myocardial Infarction; Peripheral Arterial Disease; Peripheral Venous Disease; Phlebitis; Vasculitis Past Medical History Notes: complete heart block s/p medtronic pacemaker status post patch cllosure of ASD Gastrointestinal Medical History: Negative for: Cirrhosis ; Colitis; Crohns; Hepatitis A; Hepatitis B; Hepatitis C Endocrine Medical History: Negative for: Type I Diabetes; Type II Diabetes Genitourinary Medical History: Negative for: End Stage Renal Disease Immunological Medical History: Negative for: Lupus Erythematosus; Raynauds; Scleroderma Integumentary (Skin) Medical History: Negative for: History of Burn Musculoskeletal Medical History: Negative for: Gout; Rheumatoid Arthritis; Osteoarthritis; Osteomyelitis Neurologic Medical History: Negative for: Dementia; Neuropathy; Quadriplegia; Paraplegia; Seizure Disorder Oncologic Medical History: Negative for: Received Chemotherapy; Received Radiation Psychiatric Medical History: Negative for: Anorexia/bulimia; Confinement Anxiety Past Medical History Notes: anxiety and depression HBO Extended History Items Eyes: Glaucoma Immunizations Pneumococcal Vaccine: Received Pneumococcal Vaccination: Yes Received Pneumococcal Vaccination On or After 60th Birthday: No Implantable Devices Yes Family and Social History Unknown History: Yes; Never smoker; Marital Status - Married; Alcohol Use: Never; Drug Use: No History; Caffeine Use: Rarely; Financial Concerns: No; Food, Clothing or Shelter Needs: No; Support System Lacking: No; Transportation Concerns:  No Electronic Signature(s) Signed: 08/22/2021 9:10:55 AM By: Geralyn CorwinHoffman, Kairie Vangieson DO Signed: 08/22/2021 12:11:16 PM By: Zenaida DeedBoehlein, [name] Baird, [name] Baird Entered By: Geralyn CorwinHoffman, Kemo Spruce on 08/22/2021 09:07:46 -------------------------------------------------------------------------------- SuperBill Details Patient Name: Date of Service: [name], [name] 08/22/2021 Medical Record Number: [id number] Patient Account Number: [id number] Date of Birth/Sex: Treating Baird: Mar 02, 1933 (87 y.o. 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 I48.0 Paroxysmal atrial fibrillation Z79.01 Long term (current) use of anticoagulants Z95.0 Presence of cardiac pacemaker M79.606 Pain in leg, unspecified I87.2 Venous insufficiency (chronic) (peripheral) Facility Procedures CPT4 Code: [id number] Description: 99213 - WOUND CARE VISIT-LEV 3 EST PT Modifier: Quantity: 1 Physician Procedures : CPT4 Code Description Modifier [id number] 99213 - WC PHYS LEVEL 3 - EST PT ICD-10 Diagnosis Description 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

## 2021-09-01 ENCOUNTER — Ambulatory Visit (INDEPENDENT_AMBULATORY_CARE_PROVIDER_SITE_OTHER): Payer: Medicare Other

## 2021-09-01 DIAGNOSIS — I442 Atrioventricular block, complete: Secondary | ICD-10-CM | POA: Diagnosis not present

## 2021-09-02 LAB — CUP PACEART REMOTE DEVICE CHECK
Battery Impedance: 1070 Ohm
Battery Remaining Longevity: 49 mo
Battery Voltage: 2.76 V
Brady Statistic AP VP Percent: 78 %
Brady Statistic AP VS Percent: 0 %
Brady Statistic AS VP Percent: 22 %
Brady Statistic AS VS Percent: 0 %
Date Time Interrogation Session: 20220912071611
Implantable Lead Implant Date: 20070920
Implantable Lead Implant Date: 20070920
Implantable Lead Location: 753859
Implantable Lead Location: 753860
Implantable Lead Model: 4592
Implantable Lead Model: 5092
Implantable Pulse Generator Implant Date: 20160707
Lead Channel Impedance Value: 495 Ohm
Lead Channel Impedance Value: 673 Ohm
Lead Channel Pacing Threshold Amplitude: 0.75 V
Lead Channel Pacing Threshold Amplitude: 1.375 V
Lead Channel Pacing Threshold Pulse Width: 0.4 ms
Lead Channel Pacing Threshold Pulse Width: 0.4 ms
Lead Channel Setting Pacing Amplitude: 2 V
Lead Channel Setting Pacing Amplitude: 2.75 V
Lead Channel Setting Pacing Pulse Width: 0.4 ms
Lead Channel Setting Sensing Sensitivity: 5.6 mV

## 2021-09-05 ENCOUNTER — Encounter (HOSPITAL_BASED_OUTPATIENT_CLINIC_OR_DEPARTMENT_OTHER): Payer: Medicare Other | Admitting: Internal Medicine

## 2021-09-05 ENCOUNTER — Other Ambulatory Visit: Payer: Self-pay

## 2021-09-05 DIAGNOSIS — L97319 Non-pressure chronic ulcer of right ankle with unspecified severity: Secondary | ICD-10-CM

## 2021-09-05 DIAGNOSIS — I872 Venous insufficiency (chronic) (peripheral): Secondary | ICD-10-CM | POA: Diagnosis not present

## 2021-09-05 NOTE — Progress Notes (Signed)
Remote pacemaker transmission.   

## 2021-09-08 NOTE — Progress Notes (Signed)
[name] Baird, [name] Baird ([id number]) Visit Report for 09/05/2021 Arrival Information Details Patient Name: Date of Service: [name], [place] [name] Baird. 09/05/2021 8:30 A M Medical Record Number: [id number] Patient Account Number: [id number] Date of Birth/Sex: Treating RN: August 24, 1933 (87 y.o. [name] Baird Primary Care [name] Baird: [name] Baird Other Clinician: Referring [name] Baird: Treating [name] Baird/Extender: [name] Baird in Treatment: 14 Visit Information History Since Last Visit Added or deleted any medications: No Patient Arrived: Wheel Chair Any new allergies or adverse reactions: No Arrival Time: 08:45 Had a fall or experienced change in No Accompanied By: Daughter activities of daily living that may affect Transfer Assistance: Manual risk of falls: Patient Identification Verified: Yes Signs or symptoms of abuse/neglect since last visito No Secondary Verification Process Completed: Yes Hospitalized since last visit: No Patient Requires Transmission-Based Precautions: No Implantable device outside of the clinic excluding No Patient Has Alerts: Yes cellular tissue based products placed in the center Patient Alerts: R ABI=1.03 TBI=.42 since last visit: L ABI=.97 TBI=.61 Has Dressing in Place as Prescribed: Yes Pain Present Now: No Electronic Signature(s) Signed: 09/05/2021 1:24:27 PM By: [name] Entered By: [name] on 09/05/2021 08:45:54 -------------------------------------------------------------------------------- Clinic Level of Care Assessment Details Patient Name: Date of Service: [place], [place] [name] Baird S. 09/05/2021 8:30 A M Medical Record Number: [id number] Patient Account Number: [id number] Date of Birth/Sex: Treating RN: 30-Sep-1933 (87 y.o. [name] Baird Primary Care [name] Baird: [name] Baird Other Clinician: Referring [name] Baird: Treating [name] Baird/Extender: [name] Baird in Treatment: 14 Clinic Level of Care Assessment Items TOOL 4  Quantity Score []  - 0 Use when only an EandM is performed on FOLLOW-UP visit ASSESSMENTS - Nursing Assessment / Reassessment X- 1 10 Reassessment of Co-morbidities (includes updates in patient status) X- 1 5 Reassessment of Adherence to Treatment Plan ASSESSMENTS - Wound and Skin A ssessment / Reassessment X - Simple Wound Assessment / Reassessment - one wound 1 5 []  - 0 Complex Wound Assessment / Reassessment - multiple wounds []  - 0 Dermatologic / Skin Assessment (not related to wound area) ASSESSMENTS - Focused Assessment []  - 0 Circumferential Edema Measurements - multi extremities []  - 0 Nutritional Assessment / Counseling / Intervention []  - 0 Lower Extremity Assessment (monofilament, tuning fork, pulses) []  - 0 Peripheral Arterial Disease Assessment (using hand held doppler) ASSESSMENTS - Ostomy and/or Continence Assessment and Care []  - 0 Incontinence Assessment and Management []  - 0 Ostomy Care Assessment and Management (repouching, etc.) PROCESS - Coordination of Care X - Simple Patient / Family Education for ongoing care 1 15 []  - 0 Complex (extensive) Patient / Family Education for ongoing care X- 1 10 Staff obtains , Records, T Results / Process Orders est []  - 0 Staff telephones HHA, Nursing Homes / Clarify orders / etc []  - 0 Routine Transfer to another Facility (non-emergent condition) []  - 0 Routine Hospital Admission (non-emergent condition) []  - 0 New Admissions / / Ordering NPWT Apligraf, etc. , []  - 0 Emergency Hospital Admission (emergent condition) X- 1 10 Simple Discharge Coordination []  - 0 Complex (extensive) Discharge Coordination PROCESS - Special Needs []  - 0 Pediatric / Minor Patient Management []  - 0 Isolation Patient Management []  - 0 Hearing / Language / Visual special needs []  - 0 Assessment of Community assistance (transportation, D/C planning, etc.) []  - 0 Additional assistance / Altered  mentation []  - 0 Support Surface(s) Assessment (bed, cushion, seat, etc.) INTERVENTIONS - Wound Cleansing / Measurement X - Simple Wound Cleansing - one wound 1 5 []  -  0 Complex Wound Cleansing - multiple wounds X- 1 5 Wound Imaging (photographs - any number of wounds) []  - 0 Wound Tracing (instead of photographs) []  - 0 Simple Wound Measurement - one wound []  - 0 Complex Wound Measurement - multiple wounds INTERVENTIONS - Wound Dressings X - Small Wound Dressing one or multiple wounds 1 10 []  - 0 Medium Wound Dressing one or multiple wounds []  - 0 Large Wound Dressing one or multiple wounds []  - 0 Application of Medications - topical []  - 0 Application of Medications - injection INTERVENTIONS - Miscellaneous []  - 0 External ear exam []  - 0 Specimen Collection (cultures, biopsies, blood, body fluids, etc.) []  - 0 Specimen(s) / Culture(s) sent or taken to Lab for analysis []  - 0 Patient Transfer (multiple staff / / Similar devices) []  - 0 Simple Staple / Suture removal (25 or less) []  - 0 Complex Staple / Suture removal (26 or more) []  - 0 Hypo / Hyperglycemic Management (close monitor of Blood Glucose) []  - 0 Ankle / Brachial Index (ABI) - do not check if billed separately X- 1 5 Vital Signs Has the patient been seen at the hospital within the last three years: Yes Total Score: 80 Level Of Care: New/Established - Level 3 Electronic Signature(s) Signed: 09/08/2021 4:58:58 PM By: RN, BSN Entered By: on 09/08/2021 12:00:24 -------------------------------------------------------------------------------- Lower Extremity Assessment Details Patient Name: Date of Service: [place], [place] S. 09/05/2021 8:30 A M Medical Record Number: Patient Account Number: Date of Birth/Sex: Treating RN: Apr 29, 1933 (87 y.o. Primary Care Annalese Stiner: Other Clinician: Referring Hisham Provence: Treating  [name] Baird/Extender: in Treatment: 14 Edema Assessment Assessed: : No] 09/10/2021: Yes] Edema: [Left: Ye] [Right: s] Calf Left: Right: Point of Measurement: 27 cm From Medial Instep 29 cm Ankle Left: Right: Point of Measurement: 10 cm From Medial Instep 18 cm Vascular Assessment Pulses: Dorsalis Pedis Palpable: [Right:Yes] Electronic Signature(s) Signed: 09/05/2021 1:24:27 PM By: Zenaida Deed Entered By: 09/10/2021 on 09/05/2021 08:48:02 -------------------------------------------------------------------------------- Multi Wound Chart Details Patient Name: Date of Service: [name], [name] 09/05/2021 8:30 A M Medical Record Number: [id number] Patient Account Number: [id number] Date of Birth/Sex: Treating RN: 1933-04-20 (87 y.o. [name] Baird Primary Care Darril Patriarca: [name] Baird Other Clinician: Referring Donaciano Range: Treating Hugh Kamara/Extender: [name] Baird in Treatment: 14 Vital Signs Height(in): Pulse(bpm): 78 Weight(lbs): Blood Pressure(mmHg): 133/81 Body Mass Index(BMI): Temperature(F): 98.1 Respiratory Rate(breaths/min): 18 Photos: [N/A:N/A] Right, Lateral Malleolus N/A N/A Wound Location: Trauma N/A N/A Wounding Event: Venous Leg Ulcer N/A N/A Primary Etiology: Glaucoma, Arrhythmia, Congestive N/A N/A Comorbid History: Heart Failure, Hypertension 01/21/2021 N/A N/A Date Acquired: 14 N/A N/A Weeks of Treatment: Healed - Epithelialized N/A N/A Wound Status: 0x0x0 N/A N/A Measurements L x W x D (cm) 0 N/A N/A A (cm) : rea 0 N/A N/A Volume (cm) : 100.00% N/A N/A % Reduction in Area: 100.00% N/A N/A % Reduction in Volume: Full Thickness Without Exposed N/A N/A Classification: Support Structures Medium N/A N/A Exudate Amount: Serosanguineous N/A N/A Exudate Type: red, brown N/A N/A Exudate Color: Treatment Notes Electronic Signature(s) Signed: 09/05/2021 10:15:22 AM By: 09/07/2021 DO Signed: 09/08/2021 4:58:58 PM By: [name] RN, BSN Entered By: 09/07/2021 on 09/05/2021 10:10:18 -------------------------------------------------------------------------------- Multi-Disciplinary Care Plan Details Patient Name: Date of Service: [place], [place] [id number] S. 09/05/2021 8:30 A M Medical Record Number: 05/10/1933 Patient Account Number: [id number] Date of Birth/Sex: Treating RN: [birth year] (  87 y.o. [name] Baird Primary Care Dianelys Scinto: [name] Baird Other Clinician: Referring Kalem Rockwell: Treating Jarold Macomber/Extender: [name] Baird in Treatment: 14 Active Inactive Electronic Signature(s) Signed: 09/08/2021 4:58:58 PM By: Zenaida Deed RN, BSN Signed: 09/08/2021 5:01:42 PM By: [name] Previous Signature: 09/05/2021 1:24:27 PM Version By: [name] Entered By: Zenaida Deed on 09/08/2021 12:01:20 -------------------------------------------------------------------------------- Pain Assessment Details Patient Name: Date of Service: [place], [place] [name] Baird S. 09/05/2021 8:30 A M Medical Record Number: [id number] Patient Account Number: [id number] Date of Birth/Sex: Treating RN: [birth year] (87 y.o. [name] Baird Primary Care Antwan Bribiesca: [name] Baird Other Clinician: Referring Lindy Pennisi: Treating Marillyn Goren/Extender: [name] Baird in Treatment: 14 Active Problems Location of Pain Severity and Description of Pain Patient Has Paino No Site Locations Pain Management and Medication Current Pain Management: Electronic Signature(s) Signed: 09/05/2021 1:24:27 PM By: [name] Entered By: [name] on 09/05/2021 08:46:11 -------------------------------------------------------------------------------- Patient/Caregiver Education Details Patient Name: Date of Service: Leda Roys 9/16/2022andnbsp8:30 A M Medical Record Number: [id number] Patient Account Number: [id number] Date of Birth/Gender: Treating  RN: 09/07/1933 (87 y.o. [name] Baird Primary Care Physician: [name] Baird Other Clinician: Referring Physician: Treating Physician/Extender: [name] Baird in Treatment: 14 Education Assessment Education Provided To: Patient and Caregiver Education Topics Provided Wound/Skin Impairment: Methods: Demonstration, Explain/Verbal, Printed Responses: State content correctly Electronic Signature(s) Signed: 09/05/2021 1:24:27 PM By: [name] Entered By: [name] on 09/05/2021 08:49:23 -------------------------------------------------------------------------------- Wound Assessment Details Patient Name: Date of Service: [name] 09/05/2021 8:30 A M Medical Record Number: [id number] Patient Account Number: [id number] Date of Birth/Sex: Treating RN: 1933-06-03 (87 y.o. [name] Baird Primary Care Neidra Girvan: [name] Baird Other Clinician: Referring Avelino Herren: Treating Maurice Ramseur/Extender: [name] Baird in Treatment: 14 Wound Status Wound Number: 1 Primary Etiology: Venous Leg Ulcer Wound Location: Right, Lateral Malleolus Wound Status: Healed - Epithelialized Wounding Event: Trauma Comorbid Glaucoma, Arrhythmia, Congestive Heart Failure, History: Hypertension Date Acquired: 01/21/2021 Weeks Of Treatment: 14 Clustered Wound: No Photos Wound Measurements Length: (cm) Width: (cm) Depth: (cm) Area: (cm) Volume: (cm) 0 % Reduction in Area: 100% 0 % Reduction in Volume: 100% 0 0 0 Wound Description Classification: Full Thickness Without Exposed Support Struct Exudate Amount: Medium Exudate Type: Serosanguineous Exudate Color: red, brown ures Electronic Signature(s) Signed: 09/05/2021 1:24:27 PM By: [name] Entered By: [name] on 09/05/2021 09:13:24 -------------------------------------------------------------------------------- Vitals Details Patient Name: Date of Service: [name]  09/05/2021 8:30 A M Medical Record Number: [id number] Patient Account Number: [id number] Date of Birth/Sex: Treating RN: Aug 01, 1933 (87 y.o. [name] Baird Primary Care Rona Tomson: [name] Baird Other Clinician: Referring Jakhia Buxton: Treating Jermario Kalmar/Extender: [name] Baird in Treatment: 14 Vital Signs Time Taken: 08:48 Temperature (F): 98.1 Pulse (bpm): 78 Respiratory Rate (breaths/min): 18 Blood Pressure (mmHg): 133/81 Reference Range: 80 - 120 mg / dl Electronic Signature(s) Signed: 09/05/2021 1:24:27 PM By: [name] Entered By: [name] on 09/05/2021 08:48:41

## 2021-09-08 NOTE — Progress Notes (Signed)
[name] Baird, [name] Baird ([id number]) Visit Report for 09/05/2021 Chief Complaint Document Details Patient Name: Date of Service: [place], [place] [name]. 09/05/2021 8:30 A M Medical Record Number: [id number] Patient Account Number: [id number] Date of Birth/Sex: Treating RN: 28-May-1933 (87 y.o. Tommye Standard Primary Care Provider: Bjorn Pippin Other Clinician: Referring Provider: Treating Provider/Extender: Kathyrn Drown in Treatment: 14 Information Obtained from: Patient Chief Complaint Right ankle wound Electronic Signature(s) Signed: 09/05/2021 10:15:22 AM By: Geralyn Corwin DO Entered By: Geralyn Corwin on 09/05/2021 10:10:41 -------------------------------------------------------------------------------- HPI Details Patient Name: Date of Service: [name] [place], [name] 09/05/2021 8:30 A M Medical Record Number: [id number] Patient Account Number: [id number] Date of Birth/Sex: Treating RN: 11-Aug-1933 (87 y.o. Tommye Standard Primary Care Provider: Bjorn Pippin Other Clinician: Referring Provider: Treating Provider/Extender: Kathyrn Drown in Treatment: 14 History of Present Illness HPI Description: Admission 6/10 [name] is an 87 year old female with a past medical history of paroxysmal A. fib on anticoagulation, congenital heart disease (atrial septal defect status post patch closure), pulmonic stenosis (status post valvotomy 1985), left pulmonary artery aneurysm, complete heart block status post dual-chamber permanent pacemaker implantation that presents to the clinic for a right lateral malleolus wound that developed 3 months ago. She is not quite sure how it started but thinks she may have hit it against her walker. She has been placing Neosporin on this wound daily. She reports pain to the area worse at night and often finds herself laying her leg over the side of the bed. She has been given doxycycline for this issue by her podiatrist. She  currently denies signs of infection. She reports trying to offload the area the best she can daily. 6/24; patient presents for 2-week follow-up. She has been using collagen and activating it with K-Y jelly. She has tolerated this well. She continues to have nocturnal pain at the wound site. She is scheduled to see vein and vascular on 7/6. She denies signs of infection. 7/8; patient presents for 2-week follow-up. She has been using collagen to the wound. She has no issues or complaints today. She did see Dr. Edilia Bo with vein and vascular and was told she had adequate blood flow for wound healing. She denies signs of infection. 7/15; patient presents for 1 week follow-up. She has tolerated the compression wrap. We changed the dressing last week to Santyl and Hydrofera Blue. She has no issues or complaints today. She denies signs of infection. 7/22; patient presents for 1 week follow-up. She continues to tolerate the compression wrap well. We were using Hydrofera Blue and Santyl under this. She has no issues or complaints today. She thinks the wound has improved slightly over the past week. She denies signs of infection. 8/5; patient presents for follow-up. She has been using Santyl to the wound bed daily. She has noticed improvement in nocturnal pain over the past week. She denies signs of infection. 8/19; patient presents for follow-up. She continues to use Santyl to the wound beds daily. She has no issues or complaints today. She denies signs of infection. 9/2; patient presents for follow-up. She has been using Santyl with PolyMem silver to the wound bed. She has no issues or complaints today. She denies signs of infection. 9/16; patient presents for 2-week follow-up. She has been using Santyl to the scabbed area. She states the scab came off about 2 nights ago. She reports no issues including drainage. She denies signs of infection. Electronic Signature(s) Signed: 09/05/2021 10:15:22 AM By:  Geralyn Corwin DO Entered By: Geralyn Corwin on 09/05/2021 10:11:46 -------------------------------------------------------------------------------- Physical Exam Details Patient Name: Date of Service: [place], [place] [name] S. 09/05/2021 8:30 A M Medical Record Number: [id number] Patient Account Number: [id number] Date of Birth/Sex: Treating RN: 05/19/1933 (87 y.o. Tommye Standard Primary Care Provider: Bjorn Pippin Other Clinician: Referring Provider: Treating Provider/Extender: Kathyrn Drown in Treatment: 14 Constitutional respirations regular, non-labored and within target range for patient.. Cardiovascular 2+ dorsalis pedis/posterior tibialis pulses. Psychiatric pleasant and cooperative. Notes Right ankle: Lateral malleolus with epithelialization to previous wound site. No signs of infection or skin breakdown. Electronic Signature(s) Signed: 09/05/2021 10:15:22 AM By: Geralyn Corwin DO Entered By: Geralyn Corwin on 09/05/2021 10:12:32 -------------------------------------------------------------------------------- Physician Orders Details Patient Name: Date of Service: [name] B[place], [name] 09/05/2021 8:30 A M Medical Record Number: [id number] Patient Account Number: [id number] Date of Birth/Sex: Treating RN: 28-Sep-1933 (87 y.o. 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 09/05/2021 8:30 A M Medical Record Number: [id number] Patient Account Number: [id number] Date of Birth/Sex: Treating RN: 11-17-1933 (87 y.o. 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 05/30/2021 No Yes I48.0 Paroxysmal atrial fibrillation 05/30/2021 No Yes Z79.01 Long term (current) use of anticoagulants 05/30/2021 No Yes Z95.0 Presence of cardiac pacemaker 05/30/2021 No Yes M79.606 Pain in leg, unspecified 05/30/2021 No Yes I87.2 Venous insufficiency (chronic) (peripheral) 07/11/2021 No Yes Inactive Problems Resolved Problems Electronic Signature(s) Signed: 09/05/2021 10:15:22 AM By: Geralyn Corwin DO Entered By: Geralyn Corwin on 09/05/2021 10:10:12 -------------------------------------------------------------------------------- Progress Note Details Patient Name: Date of Service: [name], [name] 09/05/2021 8:30 A M Medical Record Number: [id number] Patient Account Number: [id number] Date of Birth/Sex: Treating RN: 10/14/1933 (87 y.o. Tommye Standard Primary Care Provider: Bjorn Pippin Other Clinician: Referring Provider: Treating Provider/Extender: Kathyrn Drown in Treatment: 14 Subjective Chief Complaint Information obtained from Patient Right ankle wound History of Present Illness (HPI) Admission 6/10 Ms. Belissa Kooy is an 87 year old female with a past medical history of paroxysmal A. fib on anticoagulation, congenital heart disease  (atrial septal defect status post patch closure), pulmonic stenosis (status post valvotomy 1985), left pulmonary artery aneurysm, complete heart block status post dual-chamber permanent pacemaker implantation that presents to the clinic for a right lateral malleolus wound that developed 3 months ago. She is not quite sure how it started but thinks she may have hit it against her walker. She has been placing Neosporin on this wound daily. She reports pain to the area worse at night and often finds herself laying her leg over the side of the bed. She has been given doxycycline for this issue by her podiatrist. She currently denies signs of infection. She reports trying to offload the area the best  she can daily. 6/24; patient presents for 2-week follow-up. She has been using collagen and activating it with K-Y jelly. She has tolerated this well. She continues to have nocturnal pain at the wound site. She is scheduled to see vein and vascular on 7/6. She denies signs of infection. 7/8; patient presents for 2-week follow-up. She has been using collagen to the wound. She has no issues or complaints today. She did see Dr. Edilia Bo with vein and vascular and was told she had adequate blood flow for wound healing. She denies signs of infection. 7/15; patient presents for 1 week follow-up. She has tolerated the compression wrap. We changed the dressing last week to Santyl and Hydrofera Blue. She has no issues or complaints today. She denies signs of infection. 7/22; patient presents for 1 week follow-up. She continues to tolerate the compression wrap well. We were using Hydrofera Blue and Santyl under this. She has no issues or complaints today. She thinks the wound has improved slightly over the past week. She denies signs of infection. 8/5; patient presents for follow-up. She has been using Santyl to the wound bed daily. She has noticed improvement in nocturnal pain over the past week. She denies signs of  infection. 8/19; patient presents for follow-up. She continues to use Santyl to the wound beds daily. She has no issues or complaints today. She denies signs of infection. 9/2; patient presents for follow-up. She has been using Santyl with PolyMem silver to the wound bed. She has no issues or complaints today. She denies signs of infection. 9/16; patient presents for 2-week follow-up. She has been using Santyl to the scabbed area. She states the scab came off about 2 nights ago. She reports no issues including drainage. She denies signs of infection. Patient History Information obtained from Patient. Family History Unknown History. Social History Never smoker, Marital Status - Married, Alcohol Use - Never, Drug Use - No History, Caffeine Use - Rarely. Medical History Eyes Patient has history of Glaucoma Denies history of Cataracts, Optic Neuritis Ear/Nose/Mouth/Throat Denies history of Chronic sinus problems/congestion, Middle ear problems Hematologic/Lymphatic Denies history of Anemia, Hemophilia, Human Immunodeficiency Virus, Lymphedema, Sickle Cell Disease Respiratory Denies history of Aspiration, Asthma, Chronic Obstructive Pulmonary Disease (COPD), Pneumothorax, Sleep Apnea, Tuberculosis Cardiovascular Patient has history of Arrhythmia - atrial flutter, Congestive Heart Failure, Hypertension Denies history of Angina, Coronary Artery Disease, Deep Vein Thrombosis, Hypotension, Myocardial Infarction, Peripheral Arterial Disease, Peripheral Venous Disease, Phlebitis, Vasculitis Gastrointestinal Denies history of Cirrhosis , Colitis, Crohnoos, Hepatitis A, Hepatitis B, Hepatitis C Endocrine Denies history of Type I Diabetes, Type II Diabetes Genitourinary Denies history of End Stage Renal Disease Immunological Denies history of Lupus Erythematosus, Raynaudoos, Scleroderma Integumentary (Skin) Denies history of History of Burn Musculoskeletal Denies history of Gout, Rheumatoid  Arthritis, Osteoarthritis, Osteomyelitis Neurologic Denies history of Dementia, Neuropathy, Quadriplegia, Paraplegia, Seizure Disorder Oncologic Denies history of Received Chemotherapy, Received Radiation Psychiatric Denies history of Anorexia/bulimia, Confinement Anxiety Medical A Surgical History Notes nd Cardiovascular complete heart block s/p medtronic pacemaker status post patch cllosure of ASD Psychiatric anxiety and depression Objective Constitutional respirations regular, non-labored and within target range for patient.. Vitals Time Taken: 8:48 AM, Temperature: 98.1 F, Pulse: 78 bpm, Respiratory Rate: 18 breaths/min, Blood Pressure: 133/81 mmHg. Cardiovascular 2+ dorsalis pedis/posterior tibialis pulses. Psychiatric pleasant and cooperative. General Notes: Right ankle: Lateral malleolus with epithelialization to previous wound site. No signs of infection or skin breakdown. Integumentary (Hair, Skin) Wound #1 status is Healed - Epithelialized. Original cause of wound was  Trauma. The date acquired was: 01/21/2021. The wound has been in treatment 14 weeks. The wound is located on the Right,Lateral Malleolus. The wound measures 0cm length x 0cm width x 0cm depth; 0cm^2 area and 0cm^3 volume. There is a medium amount of serosanguineous drainage noted. 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 The area appears closed and epithelialized. I recommended keeping the area protected for the next 2 weeks. If she has any issues or concerns she knows to call us. She can follow-up as needed. Plan Discharge From [facility] Services: Discharge from Wound Care Center 1. Keep area protected 2.  Follow-up as needed 3. Discharge from clinic due to closed wound Electronic Signature(s) Signed: 09/05/2021 10:15:22 AM By: Geralyn Corwin DO Entered By: Geralyn Corwin on 09/05/2021 10:14:34 -------------------------------------------------------------------------------- HxROS Details Patient Name: Date of Service: [name], [name] 09/05/2021 8:30 A M Medical Record Number: [id number] Patient Account Number: [id number] Date of Birth/Sex: Treating RN: 1933-10-21 (87 y.o. Tommye Standard Primary Care Provider: Bjorn Pippin Other Clinician: Referring Provider: Treating Provider/Extender: Kathyrn Drown in Treatment: 14 Information Obtained From Patient Eyes Medical History: Positive for: Glaucoma Negative for: Cataracts; Optic Neuritis Ear/Nose/Mouth/Throat Medical History: Negative for: Chronic sinus problems/congestion; Middle ear problems Hematologic/Lymphatic Medical History: Negative for: Anemia; Hemophilia; Human Immunodeficiency Virus; Lymphedema; Sickle Cell Disease Respiratory Medical History: Negative for: Aspiration; Asthma; Chronic Obstructive Pulmonary Disease (COPD); Pneumothorax; Sleep Apnea; Tuberculosis Cardiovascular Medical History: Positive for: Arrhythmia - atrial flutter; Congestive Heart Failure; Hypertension Negative for: Angina; Coronary Artery Disease; Deep Vein Thrombosis; Hypotension; Myocardial Infarction; Peripheral Arterial Disease; Peripheral Venous Disease; Phlebitis; Vasculitis Past Medical History Notes: complete heart block s/p medtronic pacemaker status post patch cllosure of ASD Gastrointestinal Medical History: Negative for: Cirrhosis ; Colitis; Crohns; Hepatitis A; Hepatitis B; Hepatitis C Endocrine Medical History: Negative for: Type I Diabetes; Type II Diabetes Genitourinary Medical History: Negative for: End Stage Renal Disease Immunological Medical History: Negative for: Lupus Erythematosus;  Raynauds; Scleroderma Integumentary (Skin) Medical History: Negative for: History of Burn Musculoskeletal Medical History: Negative for: Gout; Rheumatoid Arthritis; Osteoarthritis; Osteomyelitis Neurologic Medical History: Negative for: Dementia; Neuropathy; Quadriplegia; Paraplegia; Seizure Disorder Oncologic Medical History: Negative for: Received Chemotherapy; Received Radiation Psychiatric Medical History: Negative for: Anorexia/bulimia; Confinement Anxiety Past Medical History Notes: anxiety and depression HBO Extended History Items Eyes: Glaucoma Immunizations Pneumococcal Vaccine: Received Pneumococcal Vaccination: Yes Received Pneumococcal Vaccination On or After 60th Birthday: No Implantable Devices Yes Family and Social History Unknown History: Yes; Never smoker; Marital Status - Married; Alcohol Use: Never; Drug Use: No History; Caffeine Use: Rarely; Financial Concerns: No; Food, Clothing or Shelter Needs: No; Support System Lacking: No; Transportation Concerns: No Electronic Signature(s) Signed: 09/05/2021 10:15:22 AM By: Geralyn Corwin DO Signed: 09/08/2021 4:58:58 PM By: Zenaida Deed RN, BSN Entered By: Geralyn Corwin on 09/05/2021 10:11:53 -------------------------------------------------------------------------------- SuperBill Details Patient Name: Date of Service: [name], [name] 09/05/2021 Medical Record Number: [id number] Patient Account Number: [id number] Date of Birth/Sex: Treating RN: 1933/07/16 (87 y.o. 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

## 2021-09-24 ENCOUNTER — Telehealth: Payer: Self-pay | Admitting: Cardiovascular Disease

## 2021-09-24 NOTE — Telephone Encounter (Signed)
The daughter was calling to let Dr. Royann Shivers know that the patient was getting a flu shot and 4th booster.

## 2021-09-24 NOTE — Telephone Encounter (Signed)
Patient's daughter was calling for update bout the flu shot. Please advise

## 2021-10-07 IMAGING — CR DG ANKLE COMPLETE 3+V*R*
3 series · 3 of 3 positions shown · non-contrast
Comparison: None.

CLINICAL DATA: Nonhealing wound laterally at the ankle

EXAM:
RIGHT ANKLE - COMPLETE 3+ VIEW

[x ankle ap right]
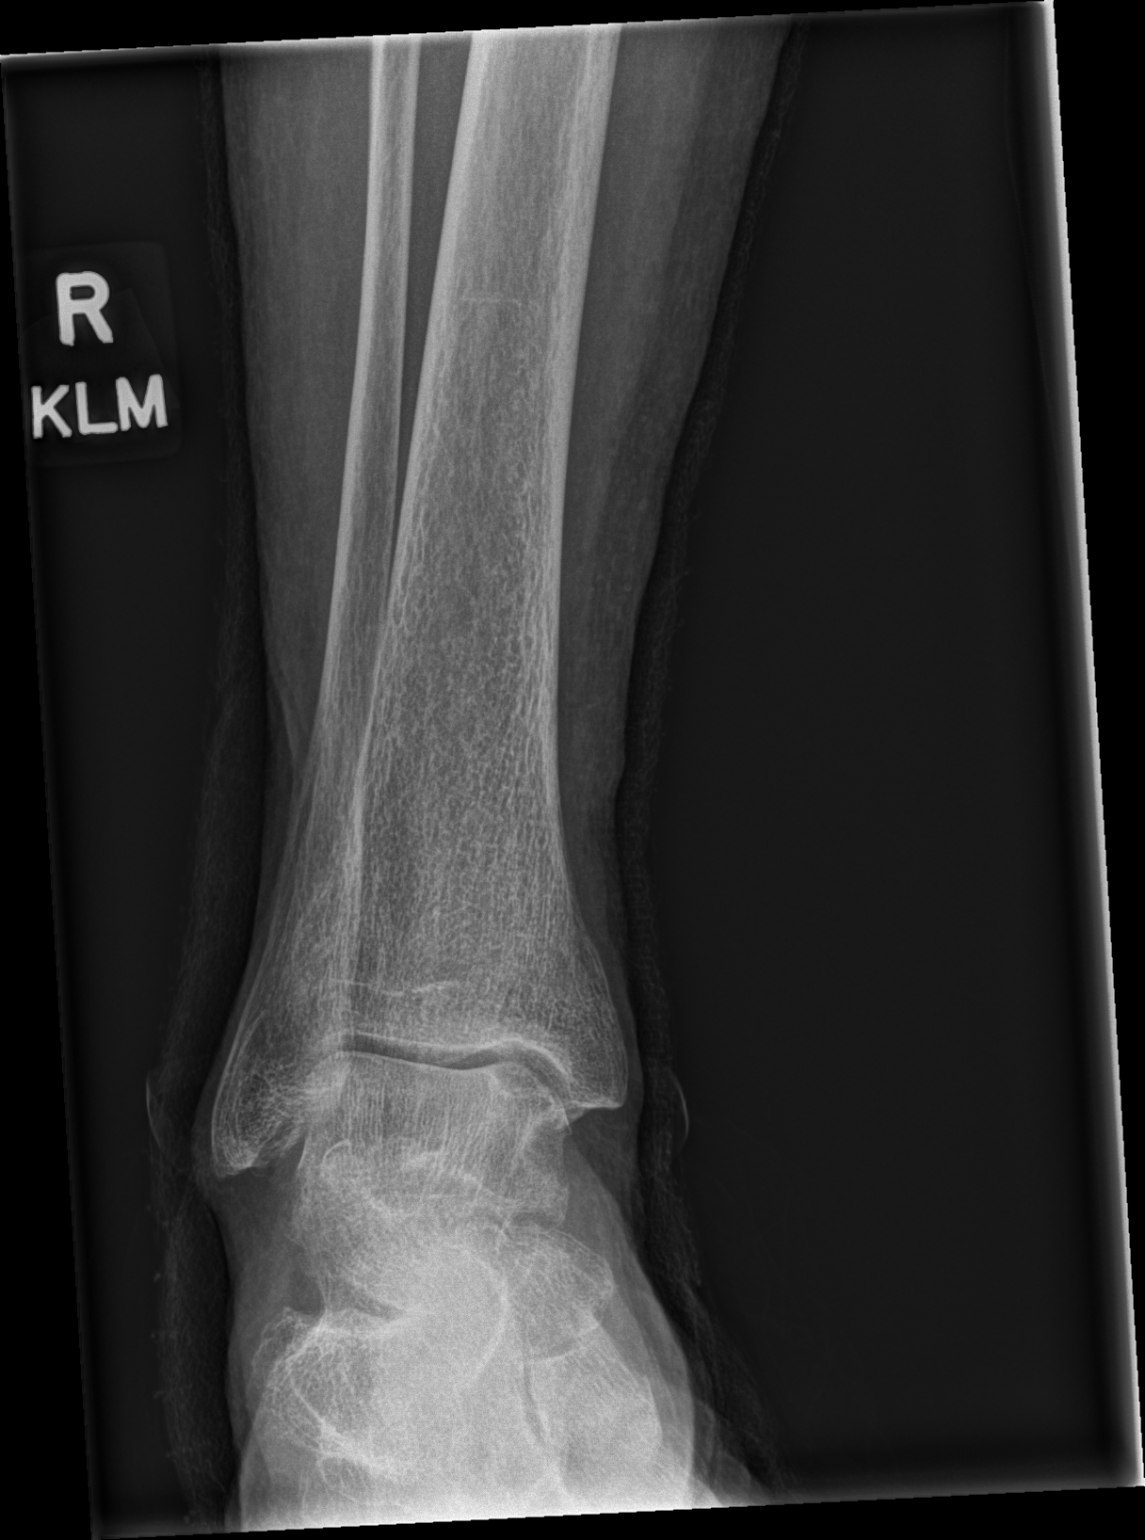

[x ankle obl right]
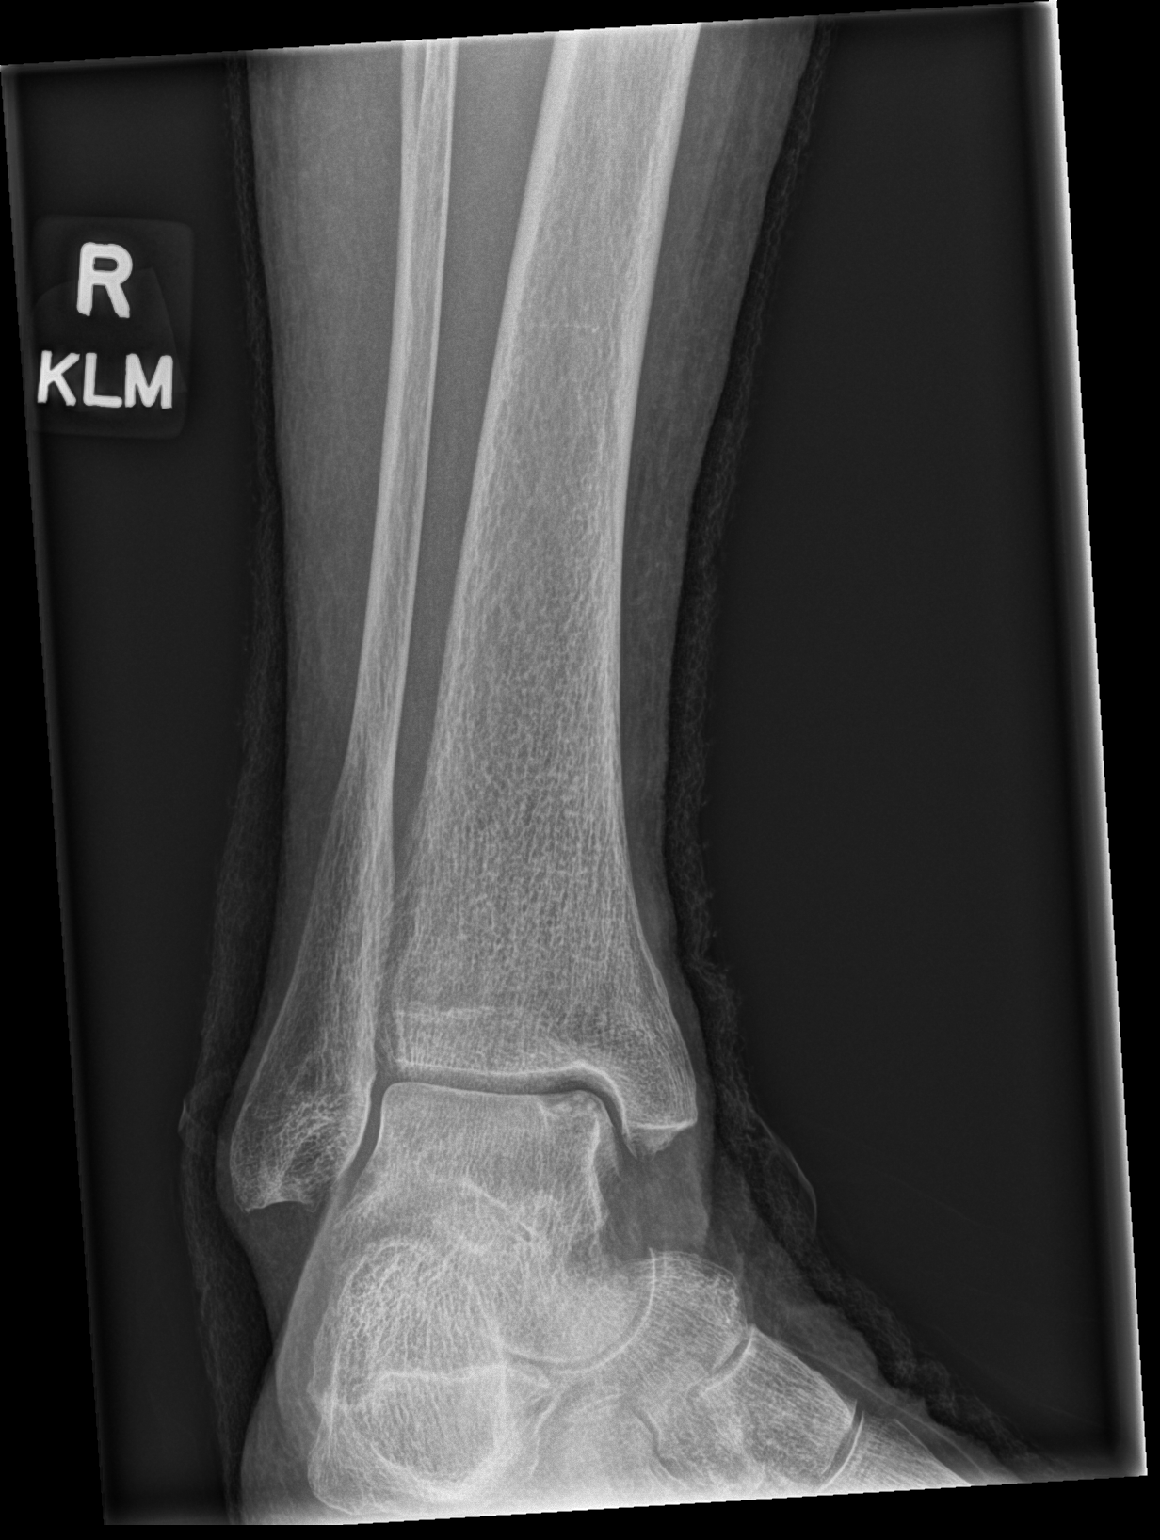

[x ankle lat right]
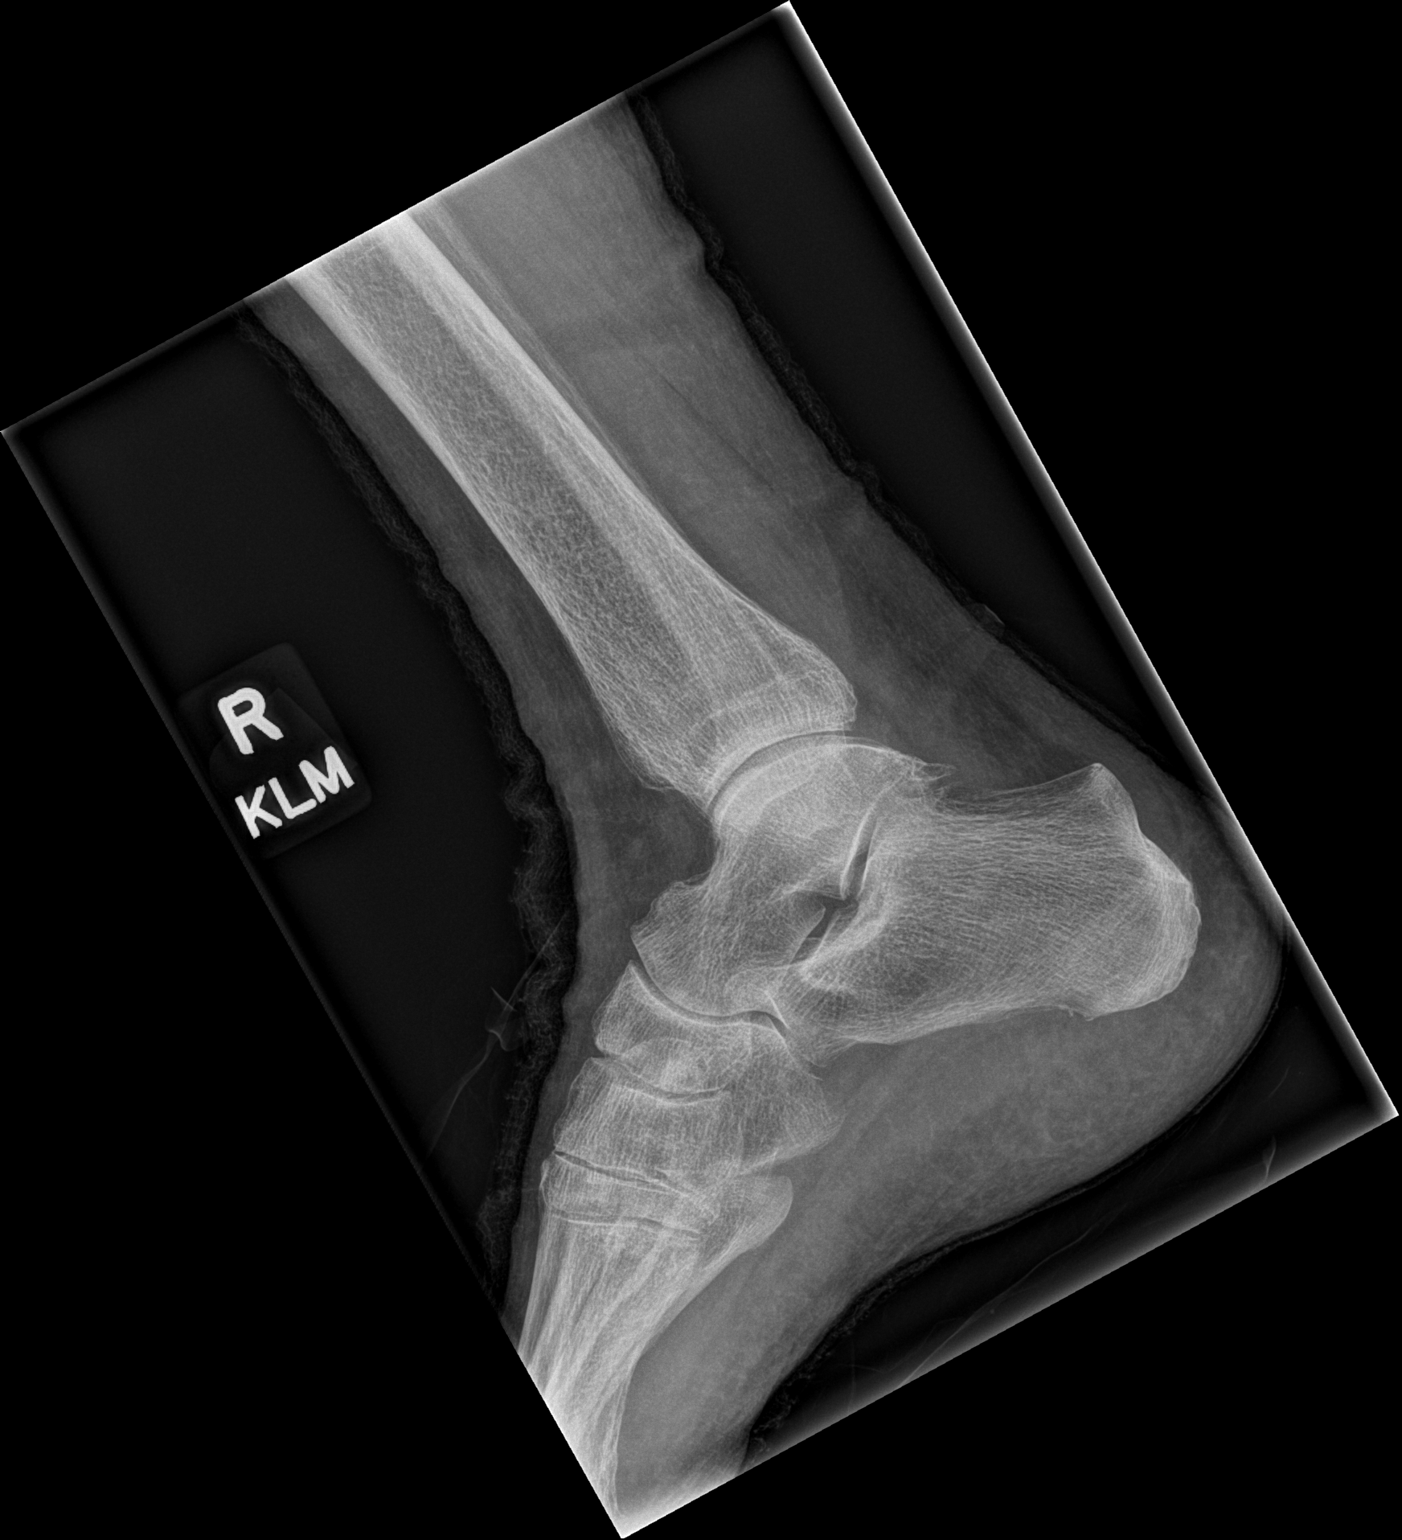

[3 of 3 positions shown; findings below may reference images not displayed]

FINDINGS: No acute fracture or dislocation is noted. No soft tissue
abnormality is seen.
IMPRESSION: No acute abnormality noted.

## 2021-10-08 LAB — LIPID PANEL WITH LDL/HDL RATIO
Cholesterol, Total: 138 mg/dL (ref 100–199)
HDL: 53 mg/dL (ref >39)
LDL Chol Calc (NIH): 60 mg/dL (ref 0–99)
LDL/HDL Ratio: 1.1 ratio (ref 0.0–3.2)
Triglycerides: 144 mg/dL (ref 0–149)
VLDL Cholesterol Cal: 25 mg/dL (ref 5–40)

## 2021-10-08 LAB — CMP14+EGFR
ALT: 10 IU/L (ref 0–32)
AST: 18 IU/L (ref 0–40)
Albumin/Globulin Ratio: 1.8 (ref 1.2–2.2)
Albumin: 4.2 g/dL (ref 3.6–4.6)
Alkaline Phosphatase: 86 IU/L (ref 44–121)
BUN/Creatinine Ratio: 13 (ref 12–28)
BUN: 16 mg/dL (ref 8–27)
Bilirubin Total: 0.6 mg/dL (ref 0.0–1.2)
CO2: 26 mmol/L (ref 20–29)
Calcium: 9.8 mg/dL (ref 8.7–10.3)
Chloride: 100 mmol/L (ref 96–106)
Creatinine, Ser: 1.24 mg/dL — ABNORMAL HIGH (ref 0.57–1.00)
Globulin, Total: 2.4 g/dL (ref 1.5–4.5)
Glucose: 107 mg/dL — ABNORMAL HIGH (ref 70–99)
Potassium: 4.6 mmol/L (ref 3.5–5.2)
Sodium: 141 mmol/L (ref 134–144)
Total Protein: 6.6 g/dL (ref 6.0–8.5)
eGFR: 42 mL/min/{1.73_m2} — ABNORMAL LOW (ref >59)

## 2021-10-08 LAB — CBC WITH DIFFERENTIAL/PLATELET
Basophils Absolute: 0.1 10*3/uL (ref 0.0–0.2)
Basos: 1 %
EOS (ABSOLUTE): 0.1 10*3/uL (ref 0.0–0.4)
Eos: 2 %
Hematocrit: 39.3 % (ref 34.0–46.6)
Hemoglobin: 12.7 g/dL (ref 11.1–15.9)
Immature Grans (Abs): 0 10*3/uL (ref 0.0–0.1)
Immature Granulocytes: 0 %
Lymphocytes Absolute: 1.1 10*3/uL (ref 0.7–3.1)
Lymphs: 19 %
MCH: 28.5 pg (ref 26.6–33.0)
MCHC: 32.3 g/dL (ref 31.5–35.7)
MCV: 88 fL (ref 79–97)
Monocytes Absolute: 0.7 10*3/uL (ref 0.1–0.9)
Monocytes: 11 %
Neutrophils Absolute: 4 10*3/uL (ref 1.4–7.0)
Neutrophils: 67 %
Platelets: 201 10*3/uL (ref 150–450)
RBC: 4.46 x10E6/uL (ref 3.77–5.28)
RDW: 13.2 % (ref 11.7–15.4)
WBC: 6 10*3/uL (ref 3.4–10.8)

## 2021-10-08 LAB — HEMOGLOBIN A1C
Est. average glucose Bld gHb Est-mCnc: 128 mg/dL
Hgb A1c MFr Bld: 6.1 % — ABNORMAL HIGH (ref 4.8–5.6)

## 2021-10-17 ENCOUNTER — Ambulatory Visit: Payer: Medicare Other | Admitting: Nurse Practitioner

## 2021-10-20 ENCOUNTER — Telehealth: Payer: Self-pay | Admitting: Nurse Practitioner

## 2021-10-20 ENCOUNTER — Other Ambulatory Visit: Payer: Self-pay | Admitting: Family Medicine

## 2021-10-20 MED ORDER — CLONAZEPAM 0.5 MG PO TABS: ORAL_TABLET | ORAL | 0 refills | Status: DC

## 2021-10-20 NOTE — Telephone Encounter (Signed)
Please send refill for Cl[facility]azepam to [facility] , pt is out

## 2021-10-20 NOTE — Telephone Encounter (Signed)
Wallace Cullens pt, requesting refill

## 2021-11-07 ENCOUNTER — Ambulatory Visit: Payer: Medicare Other | Admitting: Nurse Practitioner

## 2021-11-14 ENCOUNTER — Telehealth: Payer: Self-pay

## 2021-11-14 NOTE — Telephone Encounter (Signed)
Patient need meds refill  clonazePAM (KLONOPIN) 0.5 MG tablet  Pharmacy  [facility] [phone] [place], [place] - [address]  [address] [place] [address]  Phone:  [phone]  Fax:  [phone]

## 2021-11-17 ENCOUNTER — Ambulatory Visit (INDEPENDENT_AMBULATORY_CARE_PROVIDER_SITE_OTHER): Payer: Medicare Other | Admitting: Nurse Practitioner

## 2021-11-17 DIAGNOSIS — Z760 Encounter for issue of repeat prescription: Secondary | ICD-10-CM

## 2021-11-18 ENCOUNTER — Encounter: Payer: Self-pay | Admitting: Nurse Practitioner

## 2021-11-18 MED ORDER — CLONAZEPAM 0.5 MG PO TABS: ORAL_TABLET | ORAL | 1 refills | Status: DC

## 2021-11-18 NOTE — Telephone Encounter (Signed)
Med sent- needs to keep Jan appt

## 2021-11-18 NOTE — Telephone Encounter (Signed)
Pt called back requesting refill

## 2021-11-18 NOTE — Telephone Encounter (Signed)
Can you let me know if this is called in

## 2021-11-24 ENCOUNTER — Encounter: Payer: Self-pay | Admitting: Cardiovascular Disease

## 2021-11-24 ENCOUNTER — Ambulatory Visit (INDEPENDENT_AMBULATORY_CARE_PROVIDER_SITE_OTHER): Payer: Medicare Other | Admitting: Cardiovascular Disease

## 2021-11-24 ENCOUNTER — Other Ambulatory Visit: Payer: Self-pay

## 2021-11-24 VITALS — BP 131/76 | HR 87 | Ht 62.0 in | Wt 159.6 lb

## 2021-11-24 DIAGNOSIS — Z950 Presence of cardiac pacemaker: Secondary | ICD-10-CM | POA: Diagnosis not present

## 2021-11-24 DIAGNOSIS — I480 Paroxysmal atrial fibrillation: Secondary | ICD-10-CM | POA: Diagnosis not present

## 2021-11-24 DIAGNOSIS — I442 Atrioventricular block, complete: Secondary | ICD-10-CM

## 2021-11-24 DIAGNOSIS — Q221 Congenital pulmonary valve stenosis: Secondary | ICD-10-CM

## 2021-11-24 DIAGNOSIS — I5032 Chronic diastolic (congestive) heart failure: Secondary | ICD-10-CM

## 2021-11-24 DIAGNOSIS — E7800 Pure hypercholesterolemia, unspecified: Secondary | ICD-10-CM

## 2021-11-24 DIAGNOSIS — Z8774 Personal history of (corrected) congenital malformations of heart and circulatory system: Secondary | ICD-10-CM

## 2021-11-24 DIAGNOSIS — Z7901 Long term (current) use of anticoagulants: Secondary | ICD-10-CM

## 2021-11-24 DIAGNOSIS — I281 Aneurysm of pulmonary artery: Secondary | ICD-10-CM

## 2021-11-24 DIAGNOSIS — D500 Iron deficiency anemia secondary to blood loss (chronic): Secondary | ICD-10-CM

## 2021-11-24 NOTE — Progress Notes (Signed)
ROS  Patient ID: [name] Baird, female   DOB: [birth year], 87 y.o.   MRN: [id number]    Cardiology Office Note    Date:  11/25/2021   ID:  [name] Baird, DOB 02-19-1933, MRN [id number]  PCP:  Noreene Larsson, NP  Cardiologist:   Sanda Klein, MD   Chief Complaint  Patient presents with   Atrial Fibrillation   Pacemaker Check    History of Present Illness:  [name] Baird is a 87 y.o. female with a history of congenital heart disease (atrial septal defect status post patch closure), pulmonic stenosis (status post valvotomy 1985), left pulmonary artery aneurysm, complete heart block status post dual-chamber permanent pacemaker implantation (Medtronic, last generator change 2014) and infrequent paroxysmal atrial fibrillation (atrial flutter cavotricuspid isthmus ablation 2014). She has history of diastolic heart failure with infrequent exacerbation, most recently following an episode of lower GI bleeding with severe anemia in March 2021.  Xarelto was switched to Eliquis after that episode of bleeding.  She is accompanied today by her daughter, Rojelio Brenner.  She has been keeping a close eye on her blood pressure, weight, sodium intake.  Overall Is doing quite well.  She did develop some shortness of breath following Thanksgiving but this improved after increasing the diuretic.  She has not had any falls, injuries or serious bleeding problems.  She denies dizziness, syncope and is not aware of palpitations.  She has not had chest pain.  She does not have orthopnea or PND and has mild lower extremity edema only towards the end of the day.  For the most part she continues to take the furosemide 80 mg 4 days weekly alternating with 40 mg rarely needing to increase it further.  3 days weekly, needing to increase it rarely.  Generally we have estimated her dry weight to be in the 158-160 pound range, may be with a slight downward trend as she is losing weight slowly with age.    Device  interrogation shows normal pacemaker function.  She is pacemaker dependent due to complete heart block.  Her Medtronic Adapta dual-chamber device was implanted in 2016 (original leads are from 2007 and still have good parameters) and has roughly 4.0 years of remaining generator longevity.  She has 69 % atrial pacing (with good heart rate histogram distribution) and 99.8 % ventricular pacing.  The burden of atrial fibrillation remains very low at about 1% or less.  She had a 1 hour episode of atrial fibrillation in August and since then she has had occasional episodes of paroxysmal atrial tachycardia, never longer than 1.5 minutes.  In May 2019 she saw Dr. Marye Round Zwischenberger at [facility] to discuss treatment for pulmonary artery aneurysm.  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  Conservative management was recommended.  Echo performed there showed a left ventricular ejection fraction of 45-50%, severe left atrial enlargement, moderate regurgitation of all 4 cardiac valves, in addition to the dilated pulmonary artery and left pulmonary artery aneurysm.  In April 2017 she had one episode that lasted for 6 hours.  In March 2018 the atrial fibrillation only lasted for just over 2 minutes.  She had a 7 beat run of nonsustained ventricular tachycardia in March 2020.  She had a flutter ablation performed by Dr. Rayann Heman in July 2014 and is not taking any antiarrhythmics at this time, other than a low dose of beta blocker. At the time of  the EP study she was noted to have conventional counterclockwise isthmus-dependent right atrial flutter but then a second type of right atrial flutter was noted briefly. It could not be reinduced for full study.  She has done poorly on conventional beta-blockers (atenolol, metoprolol), but tolerates Bystolic well.   Past Medical History:  Diagnosis Date   Acute GI bleeding 03/06/2020   Anxiety    Atrial  flutter ([place])    s/p ablation 06/29/2013 by Dr Rayann Heman   CHF (congestive heart failure) ([place])    Complete heart block ([place]) 2007   s/p medtronic pacemaker implantation   Depression    Glaucoma    Hypertension    Pulmonary stenosis sp valvotomy        Shortness of breath    Status post patch closure of ASD     Past Surgical History:  Procedure Laterality Date   ABLATION  06/29/2013   s/p isthmus ablation for atrial flutter by Dr Rayann Heman   AORTIC VALVE REPAIR     ATRIAL FLUTTER ABLATION N/A 06/29/2013   Procedure: ATRIAL FLUTTER ABLATION;  Surgeon: Thompson Grayer, MD;  Location: [facility] CATH LAB;  Service: Cardiovascular;  Laterality: N/A;   CARDIAC CATHETERIZATION N/A 06/27/2015   Procedure: Temporary Pacemaker;  Surgeon: Sanda Klein, MD;  Location: [place] CV LAB;  Service: Cardiovascular;  Laterality: N/A;   CARDIOVERSION N/A 03/24/2013   Procedure: CARDIOVERSION;  Surgeon: Sanda Klein, MD;  Location: [facility] ENDOSCOPY;  Service: Cardiovascular;  Laterality: N/A;   COLONOSCOPY WITH PROPOFOL Left 03/08/2020   Procedure: COLONOSCOPY WITH PROPOFOL;  Surgeon: Arta Silence, MD;  Location: [place];  Service: Endoscopy;  Laterality: Left;   EP IMPLANTABLE DEVICE N/A 06/27/2015   Procedure: PPM Generator Changeout;  Surgeon: Sanda Klein, MD;  Location: [place] CV LAB;  Service: Cardiovascular;  Laterality: N/A;   ESOPHAGOGASTRODUODENOSCOPY (EGD) WITH PROPOFOL Left 03/07/2020   Procedure: ESOPHAGOGASTRODUODENOSCOPY (EGD) WITH PROPOFOL;  Surgeon: Arta Silence, MD;  Location: [facility] ENDOSCOPY;  Service: Endoscopy;  Laterality: Left;   PACEMAKER INSERTION  2007   Medtronic Adapta ADDRL1, serial # [id number]   pulmonary valvotomy  1985   trigeminal sx     left side    Outpatient Medications Prior to Visit  Medication Sig Dispense Refill   acetaminophen (TYLENOL) 650 MG CR tablet Take 1,300 mg by mouth daily as needed for pain.      bacitracin ophthalmic ointment      calcium-vitamin D  (OSCAL) 250-125 MG-UNIT per tablet Take 1 tablet by mouth daily.     candesartan (ATACAND) 8 MG tablet TAKE 1 TABLET BY MOUTH EVERY DAY 90 tablet 3   clonazePAM (KLONOPIN) 0.5 MG tablet Take 0.5 tablets (0.25 mg total) by mouth at bedtime. 30 tablet 1   dorzolamide-timolol (COSOPT) 22.3-6.8 MG/ML ophthalmic solution Place 2 drops into both eyes 2 (two) times daily.     furosemide (LASIX) 40 MG tablet Take 40 mg by mouth. Mon, Wed, Friday 80 mg once daily.   Sun, Tuesday, Thursday, Sat 40 mg once daily     latanoprost (XALATAN) 0.005 % ophthalmic solution Place 1 drop into both eyes at bedtime.     Multiple Vitamins-Minerals (MULTIVITAMIN PO) Take 1 tablet by mouth daily.      nebivolol (BYSTOLIC) 10 MG tablet Take 1 tablet (10 mg total) by mouth daily. 90 tablet 3   omeprazole (PRILOSEC) 20 MG capsule Take 1 capsule (20 mg total) by mouth daily. 90 capsule 1   potassium chloride (KLOR-CON) 10 MEQ tablet Take  10 mEq by mouth at bedtime. Mon, Wed, Friday: 20 mEq daily.   Sun, Tuesday, Thursday, Sat: 10 mEq once daily     RHOPRESSA 0.02 % SOLN Place 1 drop into the right eye every other day.     rosuvastatin (CRESTOR) 10 MG tablet TAKE 1 TABLET BY MOUTH EVERYDAY AT BEDTIME 90 tablet 1   venlafaxine XR (EFFEXOR-XR) 75 MG 24 hr capsule TAKE 1 CAPSULE BY MOUTH EVERY DAY 90 capsule 2   XARELTO 15 MG TABS tablet TAKE 1 TABLET (15 MG TOTAL) BY MOUTH DAILY WITH SUPPER. 90 tablet 1   clonazePAM (KLONOPIN) 0.5 MG tablet Take half tablet once daily, as needed, for anxiety (Patient not taking: Reported on 11/24/2021) 15 tablet 0   clonazePAM (KLONOPIN) 0.5 MG tablet Take half tablet by mouth at bedtime , as needed, for anxiety (Patient not taking: Reported on 11/24/2021) 15 tablet 1   traMADol (ULTRAM) 50 MG tablet Take 1 tablet (50 mg total) by mouth every 12 (twelve) hours as needed. (Patient not taking: Reported on 11/24/2021) 10 tablet 0   UNABLE TO FIND Med Name: Heel protector boot for right ankle Apply at  bedtime to promote healing of your pressure ulcer. (Patient not taking: Reported on 11/24/2021) 1 each 0   No facility-administered medications prior to visit.     Allergies:   Sulfa antibiotics and Hydrocodone   Social History   Socioeconomic History   Marital status: Widowed    Spouse name: Not on file   Number of children: 2   Years of education: Not on file   Highest education level: Not on file  Occupational History   Occupation: retired/homemaker  Tobacco Use   Smoking status: Never   Smokeless tobacco: Never  Vaping Use   Vaping Use: Never used  Substance and Sexual Activity   Alcohol use: No   Drug use: No   Sexual activity: Never  Other Topics Concern   Not on file  Social History Narrative   Not on file   Social Determinants of Health   Financial Resource Strain: Low Risk    Difficulty of Paying Living Expenses: Not hard at all  Food Insecurity: No Food Insecurity   Worried About Programme researcher, broadcasting/film/video in the Last Year: Never true   Ran Out of Food in the Last Year: Never true  Transportation Needs: No Transportation Needs   Lack of Transportation (Medical): No   Lack of Transportation (Non-Medical): No  Physical Activity: Insufficiently Active   Days of Exercise per Week: 3 days   Minutes of Exercise per Session: 30 min  Stress: No Stress Concern Present   Feeling of Stress : Not at all  Social Connections: Moderately Isolated   Frequency of Communication with Friends and Family: More than three times a week   Frequency of Social Gatherings with Friends and Family: More than three times a week   Attends Religious Services: More than 4 times per year   Active Member of [name] or Organizations: No   Attends Banker Meetings: Never   Marital Status: Widowed    ROS:   Please see the history of present illness.    All other systems are reviewed and are negative.    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), no myxedema, no xanthelasma; normal ears, nose and oropharynx Neck: normal jugular venous pulsations and no hepatojugular reflux; brisk carotid pulses without delay and no carotid bruits Chest: clear to auscultation, no signs of consolidation by percussion or palpation, normal fremitus, symmetrical and full respiratory excursions Cardiovascular: normal position and quality of the apical impulse, regular rhythm, normal first and paradoxically split second heart sounds, 2/6 systolic ejection murmur and 2/6 decrescendo diastolic murmur both heard best at the left upper sternal border, no rubs or gallops Abdomen: no tenderness or distention, no masses by palpation, no abnormal pulsatility or arterial bruits, normal bowel sounds, no hepatosplenomegaly Extremities: no clubbing, cyanosis, has trivial bilateral ankle edema; 2+ radial, ulnar and brachial pulses bilaterally; 2+ right femoral, posterior tibial and dorsalis pedis pulses; 2+ left femoral, posterior tibial and dorsalis pedis pulses; no subclavian or femoral bruits Neurological: grossly nonfocal Psych: Normal mood and affect    Wt Readings from Last 3 Encounters:  11/24/21 159 lb 9.6 oz (72.4 kg)  06/25/21 158 lb (71.7 kg)  06/13/21 158 lb (71.7 kg)    Studies/Labs Reviewed:   ECHO 10/24/2020:  1. Left ventricular ejection fraction, by estimation, is 45 to 50%. The  left ventricle has mildly decreased function. The left ventricle  demonstrates regional wall motion abnormalities with septal-lateral  dyssynchrony consistent with RV pacing. Left  ventricular diastolic parameters are  consistent with Grade I diastolic  dysfunction (impaired relaxation).   2. Right ventricular systolic function is mildly reduced. The right  ventricular size is mildly enlarged. There is mildly elevated pulmonary  artery systolic pressure. The estimated right ventricular systolic  pressure is [id number] mmHg.   3. Left atrial size was mildly dilated.   4. Right atrial size was mildly dilated.   5. Tricuspid valve regurgitation is moderate.   6. The aortic valve is tricuspid. Aortic valve regurgitation is mild.  Mild aortic valve sclerosis is present, with no evidence of aortic valve  stenosis.   7. Thickened and doming pulmonary valve. There does not appear to be  significant stenosis. There is mild to moderate pulmonic insufficiency.  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   8. Aortic dilatation noted. There is mild dilatation of the ascending  aorta, measuring 40 mm.   9. The inferior vena cava is normal in size with greater than 50%  respiratory variability, suggesting right atrial pressure of 3 mmHg.  10. The mitral valve is normal in structure. Trivial mitral valve  regurgitation.   EKG:  EKG is ordered today and shows atrial sensed, ventricular paced rhythm with a positive R wave in the V1-V2 leads, very broad QRS at 180 ms, QTC 555 ms.  No change from previous tracings  Lipid Panel     Component Value Date/Time   CHOL 138 10/07/2021 0817   TRIG 144 10/07/2021 0817   HDL 53 10/07/2021 0817   CHOLHDL 2.2 06/03/2020 0918   LDLCALC 60 10/07/2021 0817   BMET    Component Value Date/Time   NA 141 10/07/2021 0817   K 4.6 10/07/2021 0817   K 4.2 09/13/2013 1609   CL 100 10/07/2021 0817   CO2 26 10/07/2021 0817   GLUCOSE 107 (H) 10/07/2021 0817   GLUCOSE 132 (  H) 03/08/2020 0813   BUN 16 10/07/2021 0817   CREATININE 1.24 (H) 10/07/2021 0817   CREATININE 1.03 06/25/2015 1012   CALCIUM 9.8 10/07/2021 0817   GFRNONAA 44 (L) 10/03/2020 1029   GFRAA 51 (L) 10/03/2020 1029    ASSESSMENT:    1. Chronic diastolic heart failure (HCC)   2. Paroxysmal atrial fibrillation ([place])   3. CHB (complete heart block) (HCC)   4. Pacemaker   5. Long term current use of anticoagulant therapy   6. Hypercholesterolemia   7. Pulmonic stenosis, congenital   8. History of repair of congenital atrial septal defect (ASD)   9. Aneurysm of left pulmonary artery ([place])   10. Iron deficiency anemia due to chronic blood loss     PLAN:  In order of problems listed above:  CHF, diastolic: Very sedentary, but appears to have NYHA functional class I-2 status and is euvolemic/borderline hypervolemic by exam.  Dry weight seems to be under 160 pounds.  She has borderline reduced LVEF, likely related to RV apical pacing.  Her daughter is doing a great job limiting sodium intake in her diet and monitoring her weight on a daily basis. PAFlutter/ Afib: Arrhythmia burden is very low, essentially limited to a 1 hour episode that happened in August.  CHADSVasc4 (age 83, gender, HF). CHB: No escape rhythm.  Pacemaker dependent.  She tolerates threshold testing poorly. PPM: Continue remote downloads every 3 months and office visits every 6 months. Anticoagulation: Xarelto dose adjusted for low GFR.  She has not had overt bleeding complications. HLP: All lipid parameters in target range on labs from October. PS s/p surgical valvotomy: Exam is unchanged.  Mild to moderate regurgitation, no residual stenosis by echocardiogram.  No clinical signs of right heart failure today. ASD s/p patch closure: Multiple echoes have shown no evidence of residual shunt L PA aneurysm: 6.4 cm.  Plan conservative, non-surgical management.  No intention to pursue surgical repair.  Serial imaging not indicated. Anemia: Hemoglobin stable at 12.7.  No longer on any iron supplements.     Medication Adjustments/Labs and Tests Ordered: Current medicines are reviewed at length with the patient today.  Concerns regarding  medicines are outlined above.  Medication changes, Labs and Tests ordered today are listed in the Patient Instructions below. Patient Instructions  Medication Instructions:  No changes *If you need a refill on your cardiac medications before your next appointment, please call your pharmacy*   Lab Work: None ordered If you have labs (blood work) drawn today and your tests are completely normal, you will receive your results only by: [place] (if you have MyChart) OR A paper copy in the mail If you have any lab test that is abnormal or we need to change your treatment, we will call you to review the results.   Testing/Procedures: None ordered   Follow-Up: At [facility], you and your health needs are our priority.  As part of our continuing mission to provide you with exceptional heart care, we have created designated Provider Care Teams.  These Care Teams include your primary Cardiologist (physician) and Advanced Practice Providers (APPs -  Physician Assistants and Nurse Practitioners) who all work together to provide you with the care you need, when you need it.  We recommend signing up for the patient portal called "MyChart".  Sign up information is provided on this After Visit Summary.  MyChart is used to connect with patients for Virtual Visits (Telemedicine).  Patients are able to view lab/test results,  encounter notes, upcoming appointments, etc.  Non-urgent messages can be sent to your provider as well.   To learn more about what you can do with MyChart, go to [email or web address].    Your next appointment:   6 month(s)  The format for your next appointment:   In Person  Provider:   Sanda Klein, MD     Signed, Sanda Klein, MD  11/25/2021 1:55 PM    [facility] [place], [place], [place]  [address] Phone: [phone]; Fax: [phone]

## 2021-11-24 NOTE — Patient Instructions (Signed)

## 2021-11-25 ENCOUNTER — Encounter: Payer: Self-pay | Admitting: Cardiovascular Disease

## 2021-12-01 ENCOUNTER — Ambulatory Visit (INDEPENDENT_AMBULATORY_CARE_PROVIDER_SITE_OTHER): Payer: Medicare Other

## 2021-12-01 DIAGNOSIS — I442 Atrioventricular block, complete: Secondary | ICD-10-CM

## 2021-12-01 LAB — CUP PACEART REMOTE DEVICE CHECK
Battery Impedance: 1124 Ohm
Battery Remaining Longevity: 50 mo
Battery Voltage: 2.76 V
Brady Statistic AP VP Percent: 51 %
Brady Statistic AP VS Percent: 0 %
Brady Statistic AS VP Percent: 49 %
Brady Statistic AS VS Percent: 0 %
Date Time Interrogation Session: 20221212071646
Implantable Lead Implant Date: 20070920
Implantable Lead Implant Date: 20070920
Implantable Lead Location: 753859
Implantable Lead Location: 753860
Implantable Lead Model: 4592
Implantable Lead Model: 5092
Implantable Pulse Generator Implant Date: 20160707
Lead Channel Impedance Value: 488 Ohm
Lead Channel Impedance Value: 689 Ohm
Lead Channel Pacing Threshold Amplitude: 0.625 V
Lead Channel Pacing Threshold Amplitude: 1.375 V
Lead Channel Pacing Threshold Pulse Width: 0.4 ms
Lead Channel Pacing Threshold Pulse Width: 0.4 ms
Lead Channel Setting Pacing Amplitude: 2 V
Lead Channel Setting Pacing Amplitude: 2.75 V
Lead Channel Setting Pacing Pulse Width: 0.4 ms
Lead Channel Setting Sensing Sensitivity: 5.6 mV

## 2021-12-09 NOTE — Progress Notes (Signed)
Remote pacemaker transmission.   

## 2021-12-10 ENCOUNTER — Other Ambulatory Visit: Payer: Self-pay | Admitting: Cardiovascular Disease

## 2021-12-10 NOTE — Telephone Encounter (Signed)
Prescription refill request for Xarelto received.  Indication:Afib Last office visit:12/22 Weight:72.4 kg Age:87 Scr:1.2 CrCl:38.46 ml/min  Prescription refilled

## 2021-12-18 ENCOUNTER — Other Ambulatory Visit: Payer: Self-pay | Admitting: Nurse Practitioner

## 2021-12-18 ENCOUNTER — Telehealth: Payer: Self-pay

## 2021-12-18 ENCOUNTER — Telehealth: Payer: Self-pay | Admitting: Nurse Practitioner

## 2021-12-18 DIAGNOSIS — F419 Anxiety disorder, unspecified: Secondary | ICD-10-CM

## 2021-12-18 MED ORDER — CLONAZEPAM 0.5 MG PO TABS: ORAL_TABLET | ORAL | 0 refills | Status: DC

## 2021-12-18 NOTE — Telephone Encounter (Signed)
Pt callesd in for refill on   clonazePAM (KLONOPIN) 0.5 MG tablet

## 2021-12-18 NOTE — Telephone Encounter (Signed)
sent 

## 2021-12-18 NOTE — Telephone Encounter (Signed)
Patient calling requesting refill on klonopin

## 2021-12-30 ENCOUNTER — Other Ambulatory Visit: Payer: Self-pay

## 2021-12-30 ENCOUNTER — Encounter: Payer: Self-pay | Admitting: Nurse Practitioner

## 2021-12-30 ENCOUNTER — Ambulatory Visit (INDEPENDENT_AMBULATORY_CARE_PROVIDER_SITE_OTHER): Payer: Medicare Other | Admitting: Nurse Practitioner

## 2021-12-30 VITALS — BP 140/80 | HR 112 | Ht 62.0 in | Wt 161.0 lb

## 2021-12-30 DIAGNOSIS — R300 Dysuria: Secondary | ICD-10-CM | POA: Diagnosis not present

## 2021-12-30 DIAGNOSIS — N1832 Chronic kidney disease, stage 3b: Secondary | ICD-10-CM

## 2021-12-30 DIAGNOSIS — E7800 Pure hypercholesterolemia, unspecified: Secondary | ICD-10-CM

## 2021-12-30 DIAGNOSIS — Z139 Encounter for screening, unspecified: Secondary | ICD-10-CM

## 2021-12-30 DIAGNOSIS — R7303 Prediabetes: Secondary | ICD-10-CM

## 2021-12-30 DIAGNOSIS — I10 Essential (primary) hypertension: Secondary | ICD-10-CM

## 2021-12-30 DIAGNOSIS — M1990 Unspecified osteoarthritis, unspecified site: Secondary | ICD-10-CM

## 2021-12-30 LAB — POCT URINALYSIS DIP (CLINITEK)
Bilirubin, UA: NEGATIVE
Glucose, UA: NEGATIVE mg/dL
Ketones, POC UA: NEGATIVE mg/dL
Nitrite, UA: NEGATIVE
POC PROTEIN,UA: NEGATIVE
Spec Grav, UA: 1.01 (ref 1.010–1.025)
Urobilinogen, UA: 0.2 E.U./dL
pH, UA: 6 (ref 5.0–8.0)

## 2021-12-30 NOTE — Patient Instructions (Signed)
Please get your shingles and TDAP vaccine at your  pharmacy Please get your fasting abs done 3-5 days before next visit.  We will get back to you about your urine test.    It is important that you exercise regularly at least 30 minutes 5 times a week.  Think about what you will eat, plan ahead. Choose " clean, green, fresh or frozen" over canned, processed or packaged foods which are more sugary, salty and fatty. 70 to 75% of food eaten should be vegetables and fruit. Three meals at set times with snacks allowed between meals, but they must be fruit or vegetables. Aim to eat over a 12 hour period , example 7 am to 7 pm, and STOP after  your last meal of the day. Drink water,generally about 64 ounces per day, no other drink is as healthy. Fruit juice is best enjoyed in a healthy way, by EATING the fruit.  Thanks for choosing [facility], we consider it a privelige to serve you.

## 2021-12-30 NOTE — Assessment & Plan Note (Signed)
DASH diet and commitment to daily physical activity for a minimum of 30 minutes discussed and encouraged, as a part of hypertension management. The importance of attaining a healthy weight is also discussed.  BP/Weight 12/30/2021 11/24/2021 06/25/2021 06/13/2021 05/26/2021 05/12/2021 03/07/2021  Systolic BP 140 131 112 122 126 - 113  Diastolic BP 80 76 74 78 78 - 73  Wt. (Lbs) 161 159.6 158 158 158 160 161  BMI 29.45 29.19 28.9 28.9 28.9 29.26 29.45  continue current meds, followed by cardiology

## 2021-12-30 NOTE — Assessment & Plan Note (Addendum)
Obtain a UA, drink plenty of fluids to stay hydrated. Pt educated on proper perineal care. UA show trace leukocytes and small blood, will do a urine culture.

## 2021-12-30 NOTE — Assessment & Plan Note (Signed)
Lab Results  Component Value Date   CHOL 138 10/07/2021   HDL 53 10/07/2021   LDLCALC 60 10/07/2021   TRIG 144 10/07/2021   CHOLHDL 2.2 06/03/2020  lipid panel before next visit

## 2021-12-30 NOTE — Assessment & Plan Note (Signed)
Lab Results  Component Value Date   HGBA1C 6.1 (H) 10/07/2021  avoid sugars, sweet, soda

## 2021-12-30 NOTE — Assessment & Plan Note (Signed)
Stable condition. Avoid nsaids. Lab Results  Component Value Date   CREATININE 1.24 (H) 10/07/2021   BUN 16 10/07/2021   NA 141 10/07/2021   K 4.6 10/07/2021   CL 100 10/07/2021   CO2 26 10/07/2021  last EGFR 42

## 2021-12-30 NOTE — Assessment & Plan Note (Signed)
Right should arthritis.  Continue tylenol and Voltaren gel as needed.

## 2021-12-30 NOTE — Progress Notes (Signed)
° °  [name] Baird [facility]     MRN: [id number]      DOB: [birth year]   HPI [name] Baird is here for follow up and re-evaluation of chronic medical conditions, medication management and review of any available recent lab and radiology data.  Preventive health is updated, specifically  Cancer screening and Immunization.   Questions or concerns regarding consultations or procedures which the PT has had in the interim are  addressed.  The PT denies any adverse reactions to current medications since the last visit.   Pt c/o right shoulder pain since October, she has arthritis in the right shoulder, Voltaren gel, tylenol arthritis helps sometimes. Denies numbness ,tingling.   Pt c/o of intermittent burning sensation with urinating, denies frequency, abdominal pain , bloody urination.   Pt stated that she has had pneumonia vaccine, pt encouraged to get shingles and TDAP vaccine at their pharmacy , some years ago, they  refused DEXA scan .   ROS Denies recent fever or chills. Denies sinus pressure, nasal congestion, ear pain or sore throat. Denies chest congestion, productive cough or wheezing. Denies chest pains, palpitations and leg swelling Denies abdominal pain, nausea, vomiting,diarrhea or constipation.   Has  dysuria, denies frequency, hesitancy or incontinence. Has right shoulder  joint pain, no swelling and limitation in mobility. Denies headaches, seizures, numbness, or tingling. Denies depression, anxiety or insomnia. Denies skin break down or rash.   PE  BP 140/80 (BP Location: Left Arm, Patient Position: Sitting, Cuff Size: Normal)    Pulse (!) 112    Ht 5\' 2"  (1.575 m)    Wt 161 lb (73 kg)    SpO2 92%    BMI 29.45 kg/m   Patient alert and oriented and in no cardiopulmonary distress.  HEENT: No facial asymmetry, EOMI,     Neck supple .  Chest: Clear to auscultation bilaterally.  CVS: S1, S2 no murmurs, no S3.Regular rate.  ABD: Soft non tender.   Ext: No edema  MS: Adequate  ROM spine, , hips and knees. Limited ROM of right shoulder, chronic condition, tenderness on movement, uses a rolling walker.   Skin: Intact, no ulcerations or rash noted.  Psych: Good eye contact, normal affect. Memory intact not anxious or depressed appearing.  CNS: CN 2-12 intact, power,  normal throughout.no focal deficits noted.   Assessment & Plan

## 2022-01-01 LAB — URINE CULTURE: Organism ID, Bacteria: NO GROWTH

## 2022-01-01 NOTE — Progress Notes (Signed)
Urine culture was negative, drink plenty of water to stay hydrated. Thanks

## 2022-01-08 ENCOUNTER — Encounter: Payer: Self-pay | Admitting: Cardiovascular Disease

## 2022-02-23 ENCOUNTER — Telehealth: Payer: Self-pay

## 2022-02-23 NOTE — Telephone Encounter (Signed)
Patient need med refill ? ?rosuvastatin (CRESTOR) 10 MG tablet ? ?Pharmacy ? ?[id number] [place], [place] - [address]  ?[address] [place], [place] [address]  ?Phone:  [phone]  Fax:  [phone]   ? ?

## 2022-02-24 ENCOUNTER — Other Ambulatory Visit: Payer: Self-pay

## 2022-02-24 DIAGNOSIS — E7800 Pure hypercholesterolemia, unspecified: Secondary | ICD-10-CM

## 2022-02-24 MED ORDER — ROSUVASTATIN CALCIUM 10 MG PO TABS: ORAL_TABLET | ORAL | 1 refills | Status: AC

## 2022-02-24 NOTE — Telephone Encounter (Signed)
Sent to pharmacy 

## 2022-03-02 ENCOUNTER — Ambulatory Visit (INDEPENDENT_AMBULATORY_CARE_PROVIDER_SITE_OTHER): Payer: Medicare Other

## 2022-03-02 DIAGNOSIS — I442 Atrioventricular block, complete: Secondary | ICD-10-CM

## 2022-03-02 LAB — CUP PACEART REMOTE DEVICE CHECK
Battery Impedance: 1229 Ohm
Battery Remaining Longevity: 48 mo
Battery Voltage: 2.76 V
Brady Statistic AP VP Percent: 48 %
Brady Statistic AP VS Percent: 0 %
Brady Statistic AS VP Percent: 52 %
Brady Statistic AS VS Percent: 0 %
Date Time Interrogation Session: 20230313071122
Implantable Lead Implant Date: 20070920
Implantable Lead Implant Date: 20070920
Implantable Lead Location: 753859
Implantable Lead Location: 753860
Implantable Lead Model: 4592
Implantable Lead Model: 5092
Implantable Pulse Generator Implant Date: 20160707
Lead Channel Impedance Value: 496 Ohm
Lead Channel Impedance Value: 771 Ohm
Lead Channel Pacing Threshold Amplitude: 0.625 V
Lead Channel Pacing Threshold Amplitude: 1.375 V
Lead Channel Pacing Threshold Pulse Width: 0.4 ms
Lead Channel Pacing Threshold Pulse Width: 0.4 ms
Lead Channel Setting Pacing Amplitude: 2 V
Lead Channel Setting Pacing Amplitude: 2.75 V
Lead Channel Setting Pacing Pulse Width: 0.4 ms
Lead Channel Setting Sensing Sensitivity: 5.6 mV

## 2022-03-05 ENCOUNTER — Other Ambulatory Visit: Payer: Self-pay | Admitting: *Deleted

## 2022-03-05 MED ORDER — VENLAFAXINE HCL ER 75 MG PO CP24: ORAL_CAPSULE | ORAL | 0 refills | Status: DC

## 2022-03-06 ENCOUNTER — Other Ambulatory Visit: Payer: Self-pay

## 2022-03-06 ENCOUNTER — Encounter: Payer: Self-pay | Admitting: Nurse Practitioner

## 2022-03-06 ENCOUNTER — Ambulatory Visit (INDEPENDENT_AMBULATORY_CARE_PROVIDER_SITE_OTHER): Payer: Medicare Other | Admitting: Nurse Practitioner

## 2022-03-06 VITALS — BP 136/80 | HR 81 | Ht 62.0 in | Wt 156.0 lb

## 2022-03-06 DIAGNOSIS — F320 Major depressive disorder, single episode, mild: Secondary | ICD-10-CM

## 2022-03-06 DIAGNOSIS — F419 Anxiety disorder, unspecified: Secondary | ICD-10-CM | POA: Diagnosis not present

## 2022-03-06 DIAGNOSIS — N3001 Acute cystitis with hematuria: Secondary | ICD-10-CM | POA: Insufficient documentation

## 2022-03-06 LAB — POCT URINALYSIS DIP (CLINITEK)
Bilirubin, UA: NEGATIVE
Glucose, UA: NEGATIVE mg/dL
Ketones, POC UA: NEGATIVE mg/dL
Nitrite, UA: NEGATIVE
POC PROTEIN,UA: NEGATIVE
Spec Grav, UA: 1.02 (ref 1.010–1.025)
Urobilinogen, UA: 1 E.U./dL
pH, UA: 6.5 (ref 5.0–8.0)

## 2022-03-06 MED ORDER — CLONAZEPAM 0.5 MG PO TABS: ORAL_TABLET | ORAL | 0 refills | Status: AC

## 2022-03-06 MED ORDER — CEPHALEXIN 500 MG PO CAPS: 500.0000 mg | ORAL_CAPSULE | Freq: Two times a day (BID) | ORAL | 0 refills | Status: DC

## 2022-03-06 MED ORDER — VENLAFAXINE HCL ER 75 MG PO CP24: ORAL_CAPSULE | ORAL | 3 refills | Status: DC

## 2022-03-06 NOTE — Assessment & Plan Note (Signed)
Urinary odor, burning urination, frequency since last week.  Had similar complaints 2 months ago. ?UA today shows showed small  leukocytes negative nitrites, moderate blood.  ?I  will treat empirically for UTI. ?Urine is sent to the lab for culture. ?Take Keflex 500 mg 2 times daily for 7 days. ?Good hygiene discussed with patient.  They verbalized understanding ?

## 2022-03-06 NOTE — Assessment & Plan Note (Signed)
Takes clonazepam 0.25 mg daily for anxiety.  Side effects of medication including confusion which can lead to fall, worsening anxiety discussed with patient and her daughter, patient's daughter agreed that clonazepam will only be taking as needed ?Rx clonazepam 0.25 mg take every other day as needed for anxiety.  ?

## 2022-03-06 NOTE — Assessment & Plan Note (Signed)
Chronic condition well-controlled. ?Continue Effexor 75 mg daily ?Medication refilled today. ?

## 2022-03-06 NOTE — Progress Notes (Signed)
? ?  [facility]     MRN: [id number]      DOB: 02/21/1933 ? ? ?HPI ?Ms. Newey with medical history of a flutter, stage IIIb CKD, prediabetes, high blood pressure is here for complaints of burning urination,frequency, urinary odour  since last week. Pt denies fever, chills abdominal pain, bloody urine hesitancy. Pt's daughter reporst occasional confusion. No recent UTI.  ? ?ROS ?Denies recent fever or chills. ?Denies sinus pressure, nasal congestion, ear pain or sore throat. ?Denies chest congestion, productive cough or wheezing. ?Denies chest pains, palpitations and leg swelling ?Denies abdominal pain, nausea, vomiting,diarrhea or constipation.   ?Denies joint pain, swelling and limitation in mobility. ?Denies headaches, seizures, numbness, or tingling. ?Denies depression, anxiety or insomnia. ? ? ? ?PE ? ?BP 136/80 (BP Location: Left Arm, Patient Position: Sitting, Cuff Size: Normal)   Pulse 81   Ht 5\' 2"  (1.575 m)   Wt 156 lb (70.8 kg)   SpO2 99%   BMI 28.53 kg/m?  ? ?Patient alert and oriented and in no cardiopulmonary distress. ? ?Chest: Clear to auscultation bilaterally. ? ?CVS: S1, S2 no murmurs, no S3.Regular rate. ? ?ABD: Soft non tender.  ? ?Ext: No edema ? ?MS: decreased ROM spine, shoulders, hips and knees, sitting in a chair today ? ? ?Psych: Good eye contact, normal affect. Memory intact not anxious or depressed appearing. ? ? ?Assessment & Plan ? ?Acute cystitis with hematuria ?Urinary odor, burning urination, frequency since last week.  Had similar complaints 2 months ago. ?UA today shows showed small  leukocytes negative nitrites, moderate blood.  ?I  will treat empirically for UTI. ?Urine is sent to the lab for culture. ?Take Keflex 500 mg 2 times daily for 7 days. ?Good hygiene discussed with patient.  They verbalized understanding ? ?Anxiety ?Takes clonazepam 0.25 mg daily for anxiety.  Side effects of medication including confusion which can lead to fall, worsening anxiety discussed  with patient and her daughter, patient's daughter agreed that clonazepam will only be taking as needed ?Rx clonazepam 0.25 mg take every other day as needed for anxiety.   ?

## 2022-03-06 NOTE — Patient Instructions (Addendum)
Please take keflex 500 mg twice daily for 7 days for your UTI ?Please take clonazepam 0.25 mg every other day as needed for anxiety.  ? ?It is important that you exercise regularly as tolerated  least 30 minutes 5 times a week.  ?Think about what you will eat, plan ahead. ?Choose " clean, green, fresh or frozen" over canned, processed or packaged foods which are more sugary, salty and fatty. ?70 to 75% of food eaten should be vegetables and fruit. ?Three meals at set times with snacks allowed between meals, but they must be fruit or vegetables. ?Aim to eat over a 12 hour period , example 7 am to 7 pm, and STOP after  your last meal of the day. ?Drink water,generally about 64 ounces per day, no other drink is as healthy. Fruit juice is best enjoyed in a healthy way, by EATING the fruit. ? ?Thanks for choosing [facility], we consider it a privelige to serve you.  ?

## 2022-03-09 LAB — URINE CULTURE

## 2022-03-11 ENCOUNTER — Telehealth: Payer: Medicare Other | Admitting: Internal Medicine

## 2022-03-12 NOTE — Progress Notes (Signed)
Remote pacemaker transmission.   

## 2022-03-13 ENCOUNTER — Encounter: Payer: Self-pay | Admitting: Nurse Practitioner

## 2022-03-13 NOTE — Telephone Encounter (Signed)
Spoke with [name] Baird pt's daughter in law advised of [name] Baird's message, she states that [name] Baird been doing better since she Baird sent this message but she will see how she does over the weekend and go from there. ?

## 2022-03-22 ENCOUNTER — Encounter: Payer: Self-pay | Admitting: Cardiovascular Disease

## 2022-03-22 DIAGNOSIS — I480 Paroxysmal atrial fibrillation: Secondary | ICD-10-CM

## 2022-03-26 ENCOUNTER — Encounter (HOSPITAL_COMMUNITY): Payer: Self-pay | Admitting: Nurse Practitioner

## 2022-03-26 ENCOUNTER — Ambulatory Visit (HOSPITAL_COMMUNITY)
Admission: RE | Admit: 2022-03-26 | Discharge: 2022-03-26 | Disposition: A | Discharge status: Home / Self Care | Payer: Medicare Other | Source: Ambulatory Visit | Attending: Nurse Practitioner | Admitting: Nurse Practitioner

## 2022-03-26 VITALS — BP 120/66 | HR 70 | Ht 62.0 in | Wt 156.6 lb

## 2022-03-26 DIAGNOSIS — I4892 Unspecified atrial flutter: Secondary | ICD-10-CM | POA: Diagnosis present

## 2022-03-26 DIAGNOSIS — D6869 Other thrombophilia: Secondary | ICD-10-CM

## 2022-03-26 DIAGNOSIS — I484 Atypical atrial flutter: Secondary | ICD-10-CM | POA: Diagnosis not present

## 2022-03-26 DIAGNOSIS — I442 Atrioventricular block, complete: Secondary | ICD-10-CM

## 2022-03-26 DIAGNOSIS — Z950 Presence of cardiac pacemaker: Secondary | ICD-10-CM | POA: Diagnosis not present

## 2022-03-26 DIAGNOSIS — Z7901 Long term (current) use of anticoagulants: Secondary | ICD-10-CM | POA: Diagnosis not present

## 2022-03-26 MED ORDER — POTASSIUM CHLORIDE CRYS ER 10 MEQ PO TBCR: EXTENDED_RELEASE_TABLET | ORAL | Status: DC

## 2022-03-26 MED ORDER — FUROSEMIDE 40 MG PO TABS: ORAL_TABLET | ORAL | Status: DC

## 2022-03-26 NOTE — Progress Notes (Signed)
Comprehensive device interrogation performed. ?Normal generator and leads function. ?Approximately 50% A pacing (until AFlutter onset), 100% V pacing (pacemaker dependent due to complete heart block). ?In persistent atrial flutter for last 23 days. This correlates with her complaints of increased fatigue and dyspnea. ?Presenting rhythm is atrial flutter with atrial cycle length 250 ms. ? ?Overdrive atrial burst pacing was performed  with gradually shorter cycle lengths and was successful with CL 202 ms, returning to Apced-Vpaced rhythm. Frequent PACs and 3-4 beats bursts of PAT were seen. The device was temporarily reprogrammed with a lower rate limit of 80 bpm, until follow up ? ?If arrhythmia recurs early, will probably start amiodarone. ? ?Thurmon Fair, MD, [facility] ?[facility] ?[phone] office ?[phone] pager ? ?

## 2022-03-26 NOTE — Progress Notes (Addendum)
? ?Primary Care Physician: Heather Roberts, NP (Inactive) ?Referring Physician: Dr. Royann Shivers  ? ? ?[name] Baird is a 88 y.o. female with a h/o aflutter that is in the afib clinic as she went out of rhythm several days ago. She is on xarelto 15 mg daily for a CHA2DS2VASc score of  at least 5. States no missed doses. She is scheduled for a cardioversion 4/18 but Dr. Royann Shivers asked for her to be seen today to see if he could overdrive the atrial flutter. Fortunately, he was to this in clinic to convert her back to SR,  so a cardioversion will now not be necessary and procedure is cancelled.  ? ?Today, she denies symptoms of palpitations, chest pain, shortness of breath, orthopnea, PND, lower extremity edema, dizziness, presyncope, syncope, or neurologic sequela. The patient is tolerating medications without difficulties and is otherwise without complaint today.  ? ?Past Medical History:  ?Diagnosis Date  ? Acute GI bleeding 03/06/2020  ? Anxiety   ? Atrial flutter (HCC)   ? s/p ablation 06/29/2013 by Dr Johney Frame  ? CHF (congestive heart failure) (HCC)   ? Complete heart block [facility]) 2007  ? s/p medtronic pacemaker implantation  ? Depression   ? Glaucoma   ? Hypertension   ? Pulmonary stenosis sp valvotomy   ?    ? Shortness of breath   ? Status post patch closure of ASD   ? ?Past Surgical History:  ?Procedure Laterality Date  ? ABLATION  06/29/2013  ? s/p isthmus ablation for atrial flutter by Dr Johney Frame  ? AORTIC VALVE REPAIR    ? ATRIAL FLUTTER ABLATION N/A 06/29/2013  ? Procedure: ATRIAL FLUTTER ABLATION;  Surgeon: Hillis Range, MD;  Location: [facility] CATH LAB;  Service: Cardiovascular;  Laterality: N/A;  ? CARDIAC CATHETERIZATION N/A 06/27/2015  ? Procedure: Temporary Pacemaker;  Surgeon: Thurmon Fair, MD;  Location: [facility] INVASIVE CV LAB;  Service: Cardiovascular;  Laterality: N/A;  ? CARDIOVERSION N/A 03/24/2013  ? Procedure: CARDIOVERSION;  Surgeon: Thurmon Fair, MD;  Location: [facility] ENDOSCOPY;  Service: Cardiovascular;   Laterality: N/A;  ? COLONOSCOPY WITH PROPOFOL Left 03/08/2020  ? Procedure: COLONOSCOPY WITH PROPOFOL;  Surgeon: Willis Modena, MD;  Location: [facility] ENDOSCOPY;  Service: Endoscopy;  Laterality: Left;  ? EP IMPLANTABLE DEVICE N/A 06/27/2015  ? Procedure: PPM Generator Changeout;  Surgeon: Thurmon Fair, MD;  Location: [facility] INVASIVE CV LAB;  Service: Cardiovascular;  Laterality: N/A;  ? ESOPHAGOGASTRODUODENOSCOPY (EGD) WITH PROPOFOL Left 03/07/2020  ? Procedure: ESOPHAGOGASTRODUODENOSCOPY (EGD) WITH PROPOFOL;  Surgeon: Willis Modena, MD;  Location: [facility] ENDOSCOPY;  Service: Endoscopy;  Laterality: Left;  ? PACEMAKER INSERTION  2007  ? Medtronic Adapta [id number], serial # [id number]  ? pulmonary valvotomy  1985  ? trigeminal sx    ? left side  ? ? ?Current Outpatient Medications  ?Medication Sig Dispense Refill  ? acetaminophen (TYLENOL) 650 MG CR tablet Take 1,300 mg by mouth daily as needed for pain.     ? bacitracin ophthalmic ointment Place 1 application. into the left eye at bedtime.    ? calcium-vitamin D (OSCAL) 250-125 MG-UNIT per tablet Take 1 tablet by mouth daily.    ? candesartan (ATACAND) 8 MG tablet TAKE 1 TABLET BY MOUTH EVERY DAY 90 tablet 3  ? clonazePAM (KLONOPIN) 0.5 MG tablet Take half tablet every other day as needed, for anxiety (Patient taking differently: Take 1/4 tablet every day as needed, for anxiety) 15 tablet 0  ? dorzolamide-timolol (COSOPT) 22.3-6.8 MG/ML ophthalmic solution Place 2 drops  into both eyes 2 (two) times daily.    ? erythromycin ophthalmic ointment SMARTSIG:1 sparingly Left Eye Every Night    ? famotidine (PEPCID) 20 MG tablet Take 20 mg by mouth daily.    ? latanoprost (XALATAN) 0.005 % ophthalmic solution Place 1 drop into both eyes at bedtime.    ? Multiple Vitamins-Minerals (MULTIVITAMIN PO) Take 1 tablet by mouth daily.     ? nebivolol (BYSTOLIC) 10 MG tablet Take 1 tablet (10 mg total) by mouth daily. 90 tablet 3  ? RHOPRESSA 0.02 % SOLN Place 1 drop into the right eye every  other day.    ? rosuvastatin (CRESTOR) 10 MG tablet TAKE 1 TABLET BY MOUTH EVERYDAY AT BEDTIME 90 tablet 1  ? UNABLE TO FIND Med Name: Heel protector boot for right ankle ?Apply at bedtime to promote healing of your pressure ulcer. 1 each 0  ? venlafaxine XR (EFFEXOR-XR) 75 MG 24 hr capsule TAKE 1 CAPSULE BY MOUTH EVERY DAY 30 capsule 3  ? XARELTO 15 MG TABS tablet TAKE 1 TABLET (15 MG TOTAL) BY MOUTH DAILY WITH SUPPER. 90 tablet 1  ? furosemide (LASIX) 40 MG tablet Take 80mg  by mouth on Monday, Wednesday and Friday ?Take 40mg  by mouth on Tuesday, Thursday Saturday and Sunday    ? potassium chloride (KLOR-CON M10) 10 MEQ tablet Take 10meq by mouth Tuesday, Thursday Saturday and Sunday ?Take 20meq by mouth on Monday Wednesday and Friday    ? ?No current facility-administered medications for this encounter.  ? ? ?Allergies  ?Allergen Reactions  ? Sulfa Antibiotics Other (See Comments)  ?  welps ?  ? Hydrocodone Nausea Only  ?  nausea  ? ? ?Social History  ? ?Socioeconomic History  ? Marital status: Widowed  ?  Spouse name: Not on file  ? Number of children: 2  ? Years of education: Not on file  ? Highest education level: Not on file  ?Occupational History  ? Occupation: retired/homemaker  ?Tobacco Use  ? Smoking status: Never  ? Smokeless tobacco: Never  ?Vaping Use  ? Vaping Use: Never used  ?Substance and Sexual Activity  ? Alcohol use: No  ? Drug use: No  ? Sexual activity: Never  ?Other Topics Concern  ? Not on file  ?Social History Narrative  ? Not on file  ? ?Social Determinants of Health  ? ?Financial Resource Strain: Low Risk   ? Difficulty of Paying Living Expenses: Not hard at all  ?Food Insecurity: No Food Insecurity  ? Worried About Programme researcher, broadcasting/film/videounning Out of Food in the Last Year: Never true  ? Ran Out of Food in the Last Year: Never true  ?Transportation Needs: No Transportation Needs  ? Lack of Transportation (Medical): No  ? Lack of Transportation (Non-Medical): No  ?Physical Activity: Insufficiently Active  ?  Days of Exercise per Week: 3 days  ? Minutes of Exercise per Session: 30 min  ?Stress: No Stress Concern Present  ? Feeling of Stress : Not at all  ?Social Connections: Moderately Isolated  ? Frequency of Communication with Friends and Family: More than three times a week  ? Frequency of Social Gatherings with Friends and Family: More than three times a week  ? Attends Religious Services: More than 4 times per year  ? Active Member of Clubs or Organizations: No  ? Attends BankerClub or Organization Meetings: Never  ? Marital Status: Widowed  ?Intimate Partner Violence: Not At Risk  ? Fear of Current or Ex-Partner: No  ?  Emotionally Abused: No  ? Physically Abused: No  ? Sexually Abused: No  ? ? ?Family History  ?Problem Relation Age of Onset  ? Heart failure Mother   ? Stroke Father   ? Multiple sclerosis Daughter   ? Breast cancer Neg Hx   ? ? ?ROS- All systems are reviewed and negative except as per the HPI above ? ?Physical Exam: ?Vitals:  ? 03/26/22 1302  ?BP: 120/66  ?Pulse: 70  ?Weight: 71 kg  ?Height: 5\' 2"  (1.575 m)  ? ?Wt Readings from Last 3 Encounters:  ?03/26/22 71 kg  ?03/06/22 70.8 kg  ?12/30/21 73 kg  ? ? ?Labs: ?Lab Results  ?Component Value Date  ? NA 141 10/07/2021  ? K 4.6 10/07/2021  ? CL 100 10/07/2021  ? CO2 26 10/07/2021  ? GLUCOSE 107 (H) 10/07/2021  ? BUN 16 10/07/2021  ? CREATININE 1.24 (H) 10/07/2021  ? CALCIUM 9.8 10/07/2021  ? ?Lab Results  ?Component Value Date  ? INR 1.53 (H) 06/25/2015  ? ?Lab Results  ?Component Value Date  ? CHOL 138 10/07/2021  ? HDL 53 10/07/2021  ? LDLCALC 60 10/07/2021  ? TRIG 144 10/07/2021  ? ? ? ?GEN- The patient is well appearing, alert and oriented x 3 today.   ?Head- normocephalic, atraumatic ?Eyes-  Sclera clear, conjunctiva pink ?Ears- hearing intact ?Oropharynx- clear ?Neck- supple, no JVP ?Lymph- no cervical lymphadenopathy ?Lungs- Clear to ausculation bilaterally, normal work of breathing ?Heart- Regular rate and rhythm, no murmurs, rubs or gallops, PMI not  laterally displaced ?GI- soft, NT, ND, + BS ?Extremities- no clubbing, cyanosis, or edema ?MS- no significant deformity or atrophy ?Skin- no rash or lesion ?Psych- euthymic mood, full affect ?Neuro- strength

## 2022-03-31 ENCOUNTER — Encounter: Payer: Medicare Other | Admitting: Nurse Practitioner

## 2022-04-07 ENCOUNTER — Encounter (HOSPITAL_COMMUNITY): Payer: Self-pay

## 2022-04-07 ENCOUNTER — Ambulatory Visit (HOSPITAL_COMMUNITY): Admit: 2022-04-07 | Payer: Medicare Other | Admitting: Cardiology

## 2022-04-07 ENCOUNTER — Other Ambulatory Visit: Payer: Self-pay | Admitting: Cardiovascular Disease

## 2022-04-07 SURGERY — CARDIOVERSION
Anesthesia: General

## 2022-04-21 ENCOUNTER — Other Ambulatory Visit: Payer: Self-pay | Admitting: Nurse Practitioner

## 2022-04-21 DIAGNOSIS — F320 Major depressive disorder, single episode, mild: Secondary | ICD-10-CM

## 2022-04-21 MED ORDER — VENLAFAXINE HCL ER 75 MG PO CP24: ORAL_CAPSULE | ORAL | 1 refills | Status: DC

## 2022-05-13 ENCOUNTER — Telehealth: Payer: Medicare Other

## 2022-06-01 ENCOUNTER — Ambulatory Visit (INDEPENDENT_AMBULATORY_CARE_PROVIDER_SITE_OTHER): Payer: Medicare Other

## 2022-06-01 DIAGNOSIS — I442 Atrioventricular block, complete: Secondary | ICD-10-CM | POA: Diagnosis not present

## 2022-06-02 LAB — CUP PACEART REMOTE DEVICE CHECK
Battery Impedance: 1368 Ohm
Battery Remaining Longevity: 37 mo
Battery Voltage: 2.75 V
Brady Statistic AP VP Percent: 98 %
Brady Statistic AP VS Percent: 0 %
Brady Statistic AS VP Percent: 2 %
Brady Statistic AS VS Percent: 0 %
Date Time Interrogation Session: 20230612071244
Implantable Lead Implant Date: 20070920
Implantable Lead Implant Date: 20070920
Implantable Lead Location: 753859
Implantable Lead Location: 753860
Implantable Lead Model: 4592
Implantable Lead Model: 5092
Implantable Pulse Generator Implant Date: 20160707
Lead Channel Impedance Value: 496 Ohm
Lead Channel Impedance Value: 743 Ohm
Lead Channel Pacing Threshold Amplitude: 0.625 V
Lead Channel Pacing Threshold Amplitude: 1.375 V
Lead Channel Pacing Threshold Pulse Width: 0.4 ms
Lead Channel Pacing Threshold Pulse Width: 0.4 ms
Lead Channel Setting Pacing Amplitude: 2 V
Lead Channel Setting Pacing Amplitude: 2.75 V
Lead Channel Setting Pacing Pulse Width: 0.4 ms
Lead Channel Setting Sensing Sensitivity: 5.6 mV

## 2022-06-15 ENCOUNTER — Other Ambulatory Visit: Payer: Self-pay | Admitting: Cardiovascular Disease

## 2022-06-15 ENCOUNTER — Encounter: Payer: Self-pay | Admitting: Cardiovascular Disease

## 2022-06-15 ENCOUNTER — Ambulatory Visit (INDEPENDENT_AMBULATORY_CARE_PROVIDER_SITE_OTHER): Payer: Medicare Other | Admitting: Cardiovascular Disease

## 2022-06-15 VITALS — BP 118/82 | HR 82 | Ht 62.0 in | Wt 155.4 lb

## 2022-06-15 DIAGNOSIS — Z8774 Personal history of (corrected) congenital malformations of heart and circulatory system: Secondary | ICD-10-CM

## 2022-06-15 DIAGNOSIS — I484 Atypical atrial flutter: Secondary | ICD-10-CM

## 2022-06-15 DIAGNOSIS — E7800 Pure hypercholesterolemia, unspecified: Secondary | ICD-10-CM

## 2022-06-15 DIAGNOSIS — I5032 Chronic diastolic (congestive) heart failure: Secondary | ICD-10-CM | POA: Diagnosis not present

## 2022-06-15 DIAGNOSIS — I442 Atrioventricular block, complete: Secondary | ICD-10-CM | POA: Diagnosis not present

## 2022-06-15 DIAGNOSIS — Z950 Presence of cardiac pacemaker: Secondary | ICD-10-CM | POA: Diagnosis not present

## 2022-06-15 DIAGNOSIS — Q221 Congenital pulmonary valve stenosis: Secondary | ICD-10-CM

## 2022-06-15 DIAGNOSIS — I281 Aneurysm of pulmonary artery: Secondary | ICD-10-CM

## 2022-06-15 DIAGNOSIS — Z862 Personal history of diseases of the blood and blood-forming organs and certain disorders involving the immune mechanism: Secondary | ICD-10-CM

## 2022-06-15 DIAGNOSIS — D6869 Other thrombophilia: Secondary | ICD-10-CM

## 2022-06-16 ENCOUNTER — Encounter: Payer: Self-pay | Admitting: Cardiovascular Disease

## 2022-06-25 NOTE — Progress Notes (Signed)
Remote pacemaker transmission.   

## 2022-06-26 ENCOUNTER — Encounter: Payer: Medicare Other | Admitting: Nurse Practitioner

## 2022-07-11 ENCOUNTER — Other Ambulatory Visit: Payer: Self-pay

## 2022-07-11 ENCOUNTER — Telehealth: Payer: Self-pay | Admitting: Physician Assistant

## 2022-07-11 ENCOUNTER — Emergency Department (HOSPITAL_BASED_OUTPATIENT_CLINIC_OR_DEPARTMENT_OTHER)
Admission: EM | Admit: 2022-07-11 | Discharge: 2022-07-11 | Disposition: A | Discharge status: Home / Self Care | Payer: Medicare Other | Attending: Emergency Medicine | Admitting: Emergency Medicine

## 2022-07-11 ENCOUNTER — Encounter (HOSPITAL_BASED_OUTPATIENT_CLINIC_OR_DEPARTMENT_OTHER): Payer: Self-pay

## 2022-07-11 DIAGNOSIS — Z7901 Long term (current) use of anticoagulants: Secondary | ICD-10-CM | POA: Diagnosis not present

## 2022-07-11 DIAGNOSIS — R059 Cough, unspecified: Secondary | ICD-10-CM | POA: Diagnosis present

## 2022-07-11 DIAGNOSIS — U071 COVID-19: Secondary | ICD-10-CM | POA: Insufficient documentation

## 2022-07-11 DIAGNOSIS — Z79899 Other long term (current) drug therapy: Secondary | ICD-10-CM | POA: Insufficient documentation

## 2022-07-11 LAB — BASIC METABOLIC PANEL
Anion gap: 9 (ref 5–15)
BUN: 19 mg/dL (ref 8–23)
CO2: 27 mmol/L (ref 22–32)
Calcium: 9.8 mg/dL (ref 8.9–10.3)
Chloride: 101 mmol/L (ref 98–111)
Creatinine, Ser: 1.32 mg/dL — ABNORMAL HIGH (ref 0.44–1.00)
GFR, Estimated: 39 mL/min — ABNORMAL LOW (ref >60)
Glucose, Bld: 119 mg/dL — ABNORMAL HIGH (ref 70–99)
Potassium: 3.8 mmol/L (ref 3.5–5.1)
Sodium: 137 mmol/L (ref 135–145)

## 2022-07-11 MED ORDER — MOLNUPIRAVIR EUA 200MG CAPSULE: 4.0000 | ORAL_CAPSULE | Freq: Two times a day (BID) | ORAL | 0 refills | Status: AC

## 2022-07-11 NOTE — ED Triage Notes (Signed)
She c/o uri sx x couple of days. Saw here [facility] physician, who recommended they come here for testing and possible Paxlovid rx. She is in no distress and is breathing normally.

## 2022-07-11 NOTE — ED Notes (Signed)
Discharge instructions, follow up care, and prescription reviewed and explained, pt verbalized understanding. Pt had no further questions on d/c and was transported home by family members.

## 2022-07-11 NOTE — Telephone Encounter (Signed)
Cardiology after hour answering service paged for antiviral therapy for possible covid, attempted to call back, went to voicemail. Looks like the patient just arrived at [facility]. Triage note mentions possible UTI as well?

## 2022-07-11 NOTE — ED Provider Notes (Signed)
[facility] EMERGENCY DEPT Provider Note   CSN: [id number] Arrival date & time: 07/11/22  1335     History  Chief Complaint  Patient presents with   Cough/COVID +    [name] Baird is a 88 y.o. female.  Patient is a 88 year old female who presents with URI symptoms that started yesterday.  She has some runny nose congestion and coughing.  No shortness of breath.  She feels fatigued.  No fevers.  No vomiting or diarrhea.  She went to [facility] urgent care this morning and tested positive for COVID at that facility.  She also had a chest x-ray at that facility and per family was not indicative of pneumonia or other acute abnormality.  She was recommended to start on Paxlovid but she has not had any recent blood work and that facility was not clear whether it would be indicated for her based on her other medications and sent her here for further evaluation.       Home Medications Prior to Admission medications   Medication Sig Start Date End Date Taking? Authorizing Provider  molnupiravir EUA (LAGEVRIO) 200 mg CAPS capsule Take 4 capsules (800 mg total) by mouth 2 (two) times daily for 5 days. 07/11/22 07/16/22 Yes Rolan Bucco, MD  acetaminophen (TYLENOL) 650 MG CR tablet Take 1,300 mg by mouth daily as needed for pain.     [provider]  bacitracin ophthalmic ointment Place 1 application. into the left eye at bedtime. 09/10/20   [provider]  calcium-vitamin D (OSCAL) 250-125 MG-UNIT per tablet Take 1 tablet by mouth daily.    [provider]  candesartan (ATACAND) 8 MG tablet TAKE 1 TABLET BY MOUTH EVERY DAY 08/20/21   Croitoru, Rachelle Hora, MD  clonazePAM (KLONOPIN) 0.5 MG tablet Take half tablet every other day as needed, for anxiety Patient taking differently: Take 1/4 tablet every day as needed, for anxiety 03/06/22   Paseda, Phillips Grout R, FNP  dorzolamide-timolol (COSOPT) 22.3-6.8 MG/ML ophthalmic solution Place 2 drops into both eyes 2 (two)  times daily.    [provider]  erythromycin ophthalmic ointment SMARTSIG:1 sparingly Left Eye Every Night 01/29/22   [provider]  famotidine (PEPCID) 20 MG tablet Take 20 mg by mouth daily.    [provider]  furosemide (LASIX) 40 MG tablet Take 80mg  by mouth on Monday, Wednesday and Friday Take 40mg  by mouth on Tuesday, Thursday Saturday and Sunday 03/26/22   Tuesday, NP  latanoprost (XALATAN) 0.005 % ophthalmic solution Place 1 drop into both eyes at bedtime.    [provider]  Multiple Vitamins-Minerals (MULTIVITAMIN PO) Take 1 tablet by mouth daily.     [provider]  nebivolol (BYSTOLIC) 10 MG tablet Take 1 tablet (10 mg total) by mouth daily. 05/26/21   Croitoru, Mihai, MD  potassium chloride (KLOR-CON M10) 10 MEQ tablet Take [place] Nip by mouth Tuesday, Thursday Saturday and Sunday Take Monday by mouth on Monday Wednesday and Friday 03/26/22   Monday, NP  RHOPRESSA 0.02 % SOLN Place 1 drop into the right eye every other day. 09/13/20   [provider]  rosuvastatin (CRESTOR) 10 MG tablet TAKE 1 TABLET BY MOUTH EVERYDAY AT BEDTIME 02/24/22   Paseda, 09/15/20, FNP  traMADol (ULTRAM) 50 MG tablet Take 50 mg by mouth daily as needed. Patient not taking: Reported on 06/15/2022    [provider]  UNABLE TO FIND Med Name: Heel protector boot for right ankle Apply at bedtime  to promote healing of your pressure ulcer. Patient not taking: Reported on 06/15/2022 03/07/21   Heather Roberts, NP  venlafaxine XR (EFFEXOR-XR) 75 MG 24 hr capsule TAKE 1 CAPSULE BY MOUTH EVERY DAY 04/21/22   Paseda, Baird Kay, FNP  XARELTO 15 MG TABS tablet TAKE 1 TABLET (15 MG TOTAL) BY MOUTH DAILY WITH SUPPER 06/15/22   Croitoru, Mihai, MD      Allergies    Sulfa antibiotics and Hydrocodone    Review of Systems   Review of Systems  Constitutional:  Negative for chills, diaphoresis, fatigue and fever.  HENT:  Positive for congestion and  rhinorrhea. Negative for sneezing.   Eyes: Negative.   Respiratory:  Positive for cough. Negative for chest tightness and shortness of breath.   Cardiovascular:  Negative for chest pain and leg swelling.  Gastrointestinal:  Negative for abdominal pain, blood in stool, diarrhea, nausea and vomiting.  Genitourinary:  Negative for difficulty urinating, flank pain, frequency and hematuria.  Musculoskeletal:  Negative for arthralgias and back pain.  Skin:  Negative for rash.  Neurological:  Negative for dizziness, speech difficulty, weakness, numbness and headaches.    Physical Exam Updated Vital Signs BP (!) 130/59 (BP Location: Right Arm)   Pulse 80   Temp 98.4 F (36.9 C) (Oral)   Resp 16   SpO2 95%  Physical Exam Constitutional:      Appearance: She is well-developed.  HENT:     Head: Normocephalic and atraumatic.  Eyes:     Pupils: Pupils are equal, round, and reactive to light.  Cardiovascular:     Rate and Rhythm: Normal rate and regular rhythm.     Heart sounds: Normal heart sounds.  Pulmonary:     Effort: Pulmonary effort is normal. No respiratory distress.     Breath sounds: Normal breath sounds. No wheezing or rales.  Chest:     Chest wall: No tenderness.  Abdominal:     General: Bowel sounds are normal.     Palpations: Abdomen is soft.     Tenderness: There is no abdominal tenderness. There is no guarding or rebound.  Musculoskeletal:        General: Normal range of motion.     Cervical back: Normal range of motion and neck supple.  Lymphadenopathy:     Cervical: No cervical adenopathy.  Skin:    General: Skin is warm and dry.     Findings: No rash.  Neurological:     Mental Status: She is alert and oriented to person, place, and time.     ED Results / Procedures / Treatments   Labs (all labs ordered are listed, but only abnormal results are displayed) Labs Reviewed  BASIC METABOLIC PANEL - Abnormal; Notable for the following components:      Result  Value   Glucose, Bld 119 (*)    Creatinine, Ser 1.32 (*)    GFR, Estimated 39 (*)    All other components within normal limits    EKG None  Radiology No results found.  Procedures Procedures    Medications Ordered in ED Medications - No data to display  ED Course/ Medical Decision Making/ A&P                           Medical Decision Making Problems Addressed: COVID-19 virus infection: acute illness or injury with systemic symptoms  Amount and/or Complexity of Data Reviewed Independent Historian:     Details: Family members  External Data Reviewed: labs. Labs: ordered. Decision-making details documented in ED Course.  Risk Prescription drug management. Decision regarding hospitalization.   Patient is a 88 year old female who presents with URI symptoms.  She tested positive today at [facility] urgent care.  I reviewed these results.  She reported a chest x-ray that was done there that showed no evidence of pneumonia.  She has no hypoxia or shortness of breath.  She had labs done here which show a slightly elevated creatinine but similar to prior values on chart review.  I consulted with the pharmacist to did not recommend Paxlovid due to her other medications including Xarelto.  We will start molnupiravir.  She was discharged home in good condition.  Symptomatic care instructions were discussed with the patient and family.  Return precautions were given.  Final Clinical Impression(s) / ED Diagnoses Final diagnoses:  COVID-19 virus infection    Rx / DC Orders ED Discharge Orders          Ordered    molnupiravir EUA (LAGEVRIO) 200 mg CAPS capsule  2 times daily        07/11/22 1851              Rolan Bucco, MD 07/11/22 2979

## 2022-07-16 ENCOUNTER — Other Ambulatory Visit: Payer: Self-pay | Admitting: Cardiovascular Disease

## 2022-08-10 ENCOUNTER — Other Ambulatory Visit: Payer: Self-pay | Admitting: Cardiovascular Disease

## 2022-08-31 ENCOUNTER — Ambulatory Visit (INDEPENDENT_AMBULATORY_CARE_PROVIDER_SITE_OTHER): Payer: Medicare Other

## 2022-08-31 DIAGNOSIS — I442 Atrioventricular block, complete: Secondary | ICD-10-CM | POA: Diagnosis not present

## 2022-09-01 LAB — CUP PACEART REMOTE DEVICE CHECK
Battery Impedance: 1423 Ohm
Battery Remaining Longevity: 44 mo
Battery Voltage: 2.76 V
Brady Statistic AP VP Percent: 58 %
Brady Statistic AP VS Percent: 0 %
Brady Statistic AS VP Percent: 42 %
Brady Statistic AS VS Percent: 0 %
Date Time Interrogation Session: 20230911070805
Implantable Lead Implant Date: 20070920
Implantable Lead Implant Date: 20070920
Implantable Lead Location: 753859
Implantable Lead Location: 753860
Implantable Lead Model: 4592
Implantable Lead Model: 5092
Implantable Pulse Generator Implant Date: 20160707
Lead Channel Impedance Value: 504 Ohm
Lead Channel Impedance Value: 752 Ohm
Lead Channel Pacing Threshold Amplitude: 0.625 V
Lead Channel Pacing Threshold Amplitude: 1.25 V
Lead Channel Pacing Threshold Pulse Width: 0.4 ms
Lead Channel Pacing Threshold Pulse Width: 0.4 ms
Lead Channel Setting Pacing Amplitude: 2 V
Lead Channel Setting Pacing Amplitude: 2.5 V
Lead Channel Setting Pacing Pulse Width: 0.4 ms
Lead Channel Setting Sensing Sensitivity: 5.6 mV

## 2022-09-02 ENCOUNTER — Telehealth: Payer: Self-pay

## 2022-09-02 NOTE — Telephone Encounter (Signed)
Thanks! I think we should give OD pacing another try. [name] Stanley, any chance she could come in Friday?

## 2022-09-02 NOTE — Telephone Encounter (Signed)
Appointment made for 9/15 with Dr. Royann Shivers. Patient's daughter made aware.

## 2022-09-02 NOTE — Telephone Encounter (Signed)
Noted patient was previously scheduled for cardioversion but was able to be paced back into SR in office with AF clinic. Routing to Dr. Royann Shivers for review considering back in AF.

## 2022-09-04 ENCOUNTER — Encounter: Payer: Self-pay | Admitting: Cardiovascular Disease

## 2022-09-04 ENCOUNTER — Ambulatory Visit: Payer: Medicare Other | Attending: Cardiovascular Disease | Admitting: Cardiovascular Disease

## 2022-09-04 VITALS — BP 116/78 | HR 74 | Ht 62.0 in | Wt 156.0 lb

## 2022-09-04 DIAGNOSIS — I484 Atypical atrial flutter: Secondary | ICD-10-CM

## 2022-09-04 DIAGNOSIS — I5032 Chronic diastolic (congestive) heart failure: Secondary | ICD-10-CM

## 2022-09-04 DIAGNOSIS — I442 Atrioventricular block, complete: Secondary | ICD-10-CM

## 2022-09-04 DIAGNOSIS — Z950 Presence of cardiac pacemaker: Secondary | ICD-10-CM | POA: Diagnosis not present

## 2022-09-04 DIAGNOSIS — D6869 Other thrombophilia: Secondary | ICD-10-CM

## 2022-09-04 NOTE — Progress Notes (Signed)
Patient ID: [name] Baird, female   DOB: [birth year], 88 y.o.   MRN: [id number]     Cardiology Office Note    Date:  09/04/2022   ID:  [name] Baird [place], DOB 1933/09/30, MRN [id number]  PCP:  Irven Coe, MD  Cardiologist:   Thurmon Fair, MD   Chief Complaint  Patient presents with   Atrial Flutter    History of Present Illness:  [name] Baird is a 88 y.o. female with a history of congenital heart disease (atrial septal defect status post patch closure), pulmonic stenosis (status post valvotomy 1985), left pulmonary artery aneurysm, complete heart block status post dual-chamber permanent pacemaker implantation (Medtronic, last generator change 2014) and infrequent paroxysmal atrial fibrillation (atrial flutter cavotricuspid isthmus ablation 2014). She has history of diastolic heart failure with infrequent exacerbation, most recently following an episode of lower GI bleeding with severe anemia in March 2021.  Xarelto was switched to Eliquis after that episode of bleeding.  She has had intermittent problems with recurrent persistent atrial flutter.  This is associated with reduced exercise tolerance and fatigue.  Most recently she underwent overdrive pacing for atrial flutter successfully on March 26, 2022, with improvement in symptoms.  Pacemaker download shows that she has been in uninterrupted atrial flutter again since 08/18/2022.  The atrial mechanism appears very irregular with a cycle length around 330 ms.  This has been associate with some fatigue, but she denies shortness of breath, dizziness, syncope and does not have palpitations.  She does not have lower extremity edema on the current dose of diuretics.  No orthopnea or PND.  She has rarely required any adjustments in her diuretic dose (usually takes furosemide 80 mg 3 days a week and 40 mg 4 days a week).  Her weight has drifted down slightly over the last few years.  Currently appears to be stable at about 155-156  pounds.  Otherwise has normal pacemaker function.  The 100% ventricular paced (pacemaker dependent).  Before the onset of atrial flutter she had 98% atrial pacing with good heart rate histogram distribution.  Her pacemaker leads are the original ones from 2007 but still have excellent parameters.  We attempted overdrive pacing in the office today.  The baseline atrial flutter cycle length was around 330 ms.  Starting at 300 ms tried burst overdrive pacing at a gradually decreasing cycle lengths.  When pacing at 220 ms the atrial flutter reorganized to a faster cycle length at 260 ms.  This remains unchanged despite further attempts at burst overdrive pacing with cycle length as low as 160 ms.   In May 2019 she saw Dr. Lowanda Foster Zwischenberger at [facility] to discuss treatment for pulmonary artery aneurysm.  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  Conservative management was recommended.  Echo performed there showed a left ventricular ejection fraction of 45-50%, severe left atrial enlargement, moderate regurgitation of all 4 cardiac valves, in addition to the dilated pulmonary artery and left pulmonary artery aneurysm.  In April 2017 she had one episode that lasted for 6 hours.  In March 2018 the atrial fibrillation only lasted for just over 2 minutes.  She had a 7 beat run of nonsustained ventricular tachycardia in March 2020.  She had atrial flutter ablation performed by Dr. Johney Frame in July 2014 and is not taking any antiarrhythmics at this time, other than a low dose of beta blocker. At the time of the  EP study she was noted to have conventional counterclockwise isthmus-dependent right atrial flutter but then a second type of right atrial flutter was noted briefly. It could not be reinduced for full study.  She has done poorly on conventional beta-blockers (atenolol, metoprolol), but tolerates Bystolic well.   Past Medical History:   Diagnosis Date   Acute GI bleeding 03/06/2020   Anxiety    Atrial flutter ([place])    s/p ablation 06/29/2013 by Dr Rayann Heman   CHF (congestive heart failure) ([place])    Complete heart block ([place]) 2007   s/p medtronic pacemaker implantation   Depression    Glaucoma    Hypertension    Pulmonary stenosis sp valvotomy        Shortness of breath    Status post patch closure of ASD     Past Surgical History:  Procedure Laterality Date   ABLATION  06/29/2013   s/p isthmus ablation for atrial flutter by Dr Rayann Heman   AORTIC VALVE REPAIR     ATRIAL FLUTTER ABLATION N/A 06/29/2013   Procedure: ATRIAL FLUTTER ABLATION;  Surgeon: Thompson Grayer, MD;  Location: [facility] CATH LAB;  Service: Cardiovascular;  Laterality: N/A;   CARDIAC CATHETERIZATION N/A 06/27/2015   Procedure: Temporary Pacemaker;  Surgeon: Sanda Klein, MD;  Location: [place] CV LAB;  Service: Cardiovascular;  Laterality: N/A;   CARDIOVERSION N/A 03/24/2013   Procedure: CARDIOVERSION;  Surgeon: Sanda Klein, MD;  Location: [place] ENDOSCOPY;  Service: Cardiovascular;  Laterality: N/A;   COLONOSCOPY WITH PROPOFOL Left 03/08/2020   Procedure: COLONOSCOPY WITH PROPOFOL;  Surgeon: Arta Silence, MD;  Location: [place];  Service: Endoscopy;  Laterality: Left;   EP IMPLANTABLE DEVICE N/A 06/27/2015   Procedure: PPM Generator Changeout;  Surgeon: Sanda Klein, MD;  Location: [place] CV LAB;  Service: Cardiovascular;  Laterality: N/A;   ESOPHAGOGASTRODUODENOSCOPY (EGD) WITH PROPOFOL Left 03/07/2020   Procedure: ESOPHAGOGASTRODUODENOSCOPY (EGD) WITH PROPOFOL;  Surgeon: Arta Silence, MD;  [facility] ENDOSCOPY;  Service: Endoscopy;  Laterality: Left;   PACEMAKER INSERTION  2007   Medtronic Adapta ADDRL1, serial # [id number]   pulmonary valvotomy  1985   trigeminal sx     left side    Outpatient Medications Prior to Visit  Medication Sig Dispense Refill   bacitracin ophthalmic ointment Place 1 application. into the left eye at bedtime.      calcium-vitamin D (OSCAL) 250-125 MG-UNIT per tablet Take 1 tablet by mouth daily.     candesartan (ATACAND) 8 MG tablet TAKE 1 TABLET BY MOUTH EVERY DAY 90 tablet 3   clonazePAM (KLONOPIN) 0.5 MG tablet Take half tablet every other day as needed, for anxiety (Patient taking differently: Take 1/4 tablet every day as needed, for anxiety) 15 tablet 0   dorzolamide-timolol (COSOPT) 22.3-6.8 MG/ML ophthalmic solution Place 2 drops into both eyes 2 (two) times daily.     erythromycin ophthalmic ointment SMARTSIG:1 sparingly Left Eye Every Night     famotidine (PEPCID) 20 MG tablet Take 20 mg by mouth daily.     furosemide (LASIX) 40 MG tablet Take 80mg  by mouth on Monday, Wednesday and Friday Take 40mg  by mouth on Tuesday, Thursday Saturday and Sunday     latanoprost (XALATAN) 0.005 % ophthalmic solution Place 1 drop into both eyes at bedtime.     Multiple Vitamins-Minerals (MULTIVITAMIN PO) Take 1 tablet by mouth daily.      nebivolol (BYSTOLIC) 10 MG tablet TAKE 1 TABLET BY MOUTH EVERY DAY 90 tablet 3   potassium chloride (KLOR-CON M10) 10  MEQ tablet Take 2meq by mouth Tuesday, Thursday Saturday and Sunday Take 47meq by mouth on Monday Wednesday and Friday     RHOPRESSA 0.02 % SOLN Place 1 drop into the right eye every other day.     rosuvastatin (CRESTOR) 10 MG tablet TAKE 1 TABLET BY MOUTH EVERYDAY AT BEDTIME 90 tablet 1   traMADol (ULTRAM) 50 MG tablet Take 50 mg by mouth daily as needed.     UNABLE TO FIND Med Name: Heel protector boot for right ankle Apply at bedtime to promote healing of your pressure ulcer. 1 each 0   venlafaxine XR (EFFEXOR-XR) 75 MG 24 hr capsule TAKE 1 CAPSULE BY MOUTH EVERY DAY 90 capsule 1   XARELTO 15 MG TABS tablet TAKE 1 TABLET (15 MG TOTAL) BY MOUTH DAILY WITH SUPPER 90 tablet 1   acetaminophen (TYLENOL) 650 MG CR tablet Take 1,300 mg by mouth daily as needed for pain.  (Patient not taking: Reported on 09/04/2022)     No facility-administered medications prior  to visit.     Allergies:   Sulfa antibiotics and Hydrocodone   Social History   Socioeconomic History   Marital status: Widowed    Spouse name: Not on file   Number of children: 2   Years of education: Not on file   Highest education level: Not on file  Occupational History   Occupation: retired/homemaker  Tobacco Use   Smoking status: Never   Smokeless tobacco: Never  Vaping Use   Vaping Use: Never used  Substance and Sexual Activity   Alcohol use: No   Drug use: No   Sexual activity: Never  Other Topics Concern   Not on file  Social History Narrative   Not on file   Social Determinants of Health   Financial Resource Strain: Low Risk  (05/12/2021)   Overall Financial Resource Strain (CARDIA)    Difficulty of Paying Living Expenses: Not hard at all  Food Insecurity: No Food Insecurity (05/12/2021)   Hunger Vital Sign    Worried About Running Out of Food in the Last Year: Never true    [place] in the Last Year: Never true  Transportation Needs: No Transportation Needs (05/12/2021)   PRAPARE - Hydrologist (Medical): No    Lack of Transportation (Non-Medical): No  Physical Activity: Insufficiently Active (05/12/2021)   Exercise Vital Sign    Days of Exercise per Week: 3 days    Minutes of Exercise per Session: 30 min  Stress: No Stress Concern Present (05/12/2021)   [place]    Feeling of Stress : Not at all  Social Connections: Moderately Isolated (05/12/2021)   Social Connection and Isolation Panel [NHANES]    Frequency of Communication with Friends and Family: More than three times a week    Frequency of Social Gatherings with Friends and Family: More than three times a week    Attends Religious Services: More than 4 times per year    Active Member of [employer] or Organizations: No    Attends Archivist Meetings: Never    Marital Status: Widowed    ROS:    Please see the history of present illness.    All other systems are reviewed and are negative.    PHYSICAL EXAM:   VS:  BP 116/78 (BP Location: Left Arm, Patient Position: Sitting, Cuff Size: Normal)   Pulse 74   Ht 5\' 2"  (1.575  m)   Wt 156 lb (70.8 kg)   SpO2 97%   BMI 28.53 kg/m      General: Alert, oriented x3, no distress, appears well. Head: Partial fusion of the left eyelid unchanged; normal ears, nose and oropharynx Neck: normal jugular venous pulsations and no hepatojugular reflux; brisk carotid pulses without delay and no carotid bruits Chest: clear to auscultation, no signs of consolidation by percussion or palpation, normal fremitus, symmetrical and full respiratory excursions Cardiovascular: normal position and quality of the apical impulse, regular rhythm, normal first and paradoxically split second heart sounds, no rubs or gallops Abdomen: no tenderness or distention, no masses by palpation, no abnormal pulsatility or arterial bruits, normal bowel sounds, 2/6 ejection murmur and 1/6 decrescendo diastolic murmur heard best at the left upper sternal border, no hepatosplenomegaly Extremities: no clubbing, cyanosis or edema; 2+ radial, ulnar and brachial pulses bilaterally; 2+ right femoral, posterior tibial and dorsalis pedis pulses; 2+ left femoral, posterior tibial and dorsalis pedis pulses; no subclavian or femoral bruits Neurological: grossly nonfocal Psych: Normal mood and affect    Wt Readings from Last 3 Encounters:  09/04/22 156 lb (70.8 kg)  06/15/22 155 lb 6.4 oz (70.5 kg)  03/26/22 156 lb 9.6 oz (71 kg)    Studies/Labs Reviewed:   ECHO 10/24/2020:  1. Left ventricular ejection fraction, by estimation, is 45 to 50%. The  left ventricle has mildly decreased function. The left ventricle  demonstrates regional wall motion abnormalities with septal-lateral  dyssynchrony consistent with RV pacing. Left  ventricular diastolic parameters are consistent with Grade  I diastolic  dysfunction (impaired relaxation).   2. Right ventricular systolic function is mildly reduced. The right  ventricular size is mildly enlarged. There is mildly elevated pulmonary  artery systolic pressure. The estimated right ventricular systolic  pressure is 37.1 mmHg.   3. Left atrial size was mildly dilated.   4. Right atrial size was mildly dilated.   5. Tricuspid valve regurgitation is moderate.   6. The aortic valve is tricuspid. Aortic valve regurgitation is mild.  Mild aortic valve sclerosis is present, with no evidence of aortic valve  stenosis.   7. Thickened and doming pulmonary valve. There does not appear to be  significant stenosis. There is mild to moderate pulmonic insufficiency.  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   8. Aortic dilatation noted. There is mild dilatation of the ascending  aorta, measuring 40 mm.   9. The inferior vena cava is normal in size with greater than 50%  respiratory variability, suggesting right atrial pressure of 3 mmHg.  10. The mitral valve is normal in structure. Trivial mitral valve  regurgitation.   EKG:  EKG is ordered today and shows background atrial flutter with 100% ventricular paced rhythm (complete heart block).  Positive R wave in lead V1, broad QRS 180 ms  Lipid Panel     Component Value Date/Time   CHOL 138 10/07/2021 0817   TRIG 144 10/07/2021 0817   HDL 53 10/07/2021 0817   CHOLHDL 2.2 06/03/2020 0918   LDLCALC 60 10/07/2021 0817   BMET    Component Value Date/Time   NA 137 07/11/2022 1700   NA 141 10/07/2021 0817   K 3.8 07/11/2022 1700   K 4.2 09/13/2013 1609   CL 101 07/11/2022 1700   CO2 27 07/11/2022 1700   GLUCOSE 119 (H) 07/11/2022 1700   BUN 19 07/11/2022 1700   BUN 16 10/07/2021 0817   CREATININE 1.32 (H) 07/11/2022  1700   CREATININE 1.03 06/25/2015 1012   CALCIUM 9.8 07/11/2022 1700   GFRNONAA 39 (L) 07/11/2022 1700   GFRAA 51 (L) 10/03/2020 1029   04/07/2022 Cholesterol  128, HDL 51, LDL 50, triglycerides 159 Hemoglobin 13.2, creatinine 1.13, potassium 4.3, ALT 10  10/07/2021 Hemoglobin A1c 6.1%  ASSESSMENT:    1. Chronic diastolic heart failure ([place])   2. Atypical atrial flutter (H[facility])   3. CHB (complete heart block) (H[facility])   4. Pacemaker   5. Acquired thrombophilia ([place])     PLAN:  In order of problems listed above:  CHF, diastolic: Slightly more fatigued since in atrial flutter, but appears to still have NYHA functional class I and is clinically euvolemic on the current diuretic dose.  Dry weight seems to be 155-156 pounds.  She has borderline reduced LVEF, likely related to RV apical pacing.   PAFlutter/ Afib: This has frequently caused deterioration in clinical status, but she is not severely symptomatic today.  Previously we were successful with overdrive pacing, but today this only lead to acceleration of the arrhythmia.  There have been no interruptions in her anticoagulants.  We will schedule for cardioversion later this month, but we will perform a remote pacemaker download a day or 2 before the cardioversion just in case she converts back to normal rhythm.  CHADSVasc4 (age 51, gender, HF). CHB: Pacemaker dependent, feels very poorly when we performed ventricular threshold testing. PPM: Normal device function.  Performing remote downloads. Anticoagulation: Compliant with anticoagulation without any recent interruptions.  No bleeding complications in the last 3 years or so.  Also dose adjusted for renal function. HLP: All lipid parameters in desirable range on current medications.  Hemoglobin A1c in prediabetes range.  Mildly overweight with BMI 28.  No known CAD/PAD PS s/p surgical valvotomy: Exam is unchanged.  Mild to moderate regurgitation, no residual stenosis by echocardiogram.  No clinical signs of right heart failure today. ASD s/p patch closure: Multiple echoes have shown no evidence of residual shunt L PA aneurysm: 6.4 cm.  Plan  conservative, non-surgical management.  No intention to pursue surgical repair.  Serial imaging not indicated. Anemia: Hemoglobin has remained stable off iron supplements, most recently 12.7.  Shared Decision Making/Informed Consent The risks (stroke, cardiac arrhythmias rarely resulting in the need for a temporary or permanent pacemaker, skin irritation or burns and complications associated with conscious sedation including aspiration, arrhythmia, respiratory failure and death), benefits (restoration of normal sinus rhythm) and alternatives of a direct current cardioversion were explained in detail to Ms. Mazer and she agrees to proceed.      Medication Adjustments/Labs and Tests Ordered: Current medicines are reviewed at length with the patient today.  Concerns regarding medicines are outlined above.  Medication changes, Labs and Tests ordered today are listed in the Patient Instructions below. Patient Instructions  Medication Instructions:  No changes *If you need a refill on your cardiac medications before your next appointment, please call your pharmacy*   Lab Work: None ordered If you have labs (blood work) drawn today and your tests are completely normal, you will receive your results only by: [place] (if you have MyChart) OR A paper copy in the mail If you have any lab test that is abnormal or we need to change your treatment, we will call you to review the results.   Follow-Up: At [facility], you and your health needs are our priority.  As part of our continuing mission to provide you with exceptional heart  care, we have created designated Provider Care Teams.  These Care Teams include your primary Cardiologist (physician) and Advanced Practice Providers (APPs -  Physician Assistants and Nurse Practitioners) who all work together to provide you with the care you need, when you need it.  We recommend signing up for the patient portal called "MyChart".  Sign up  information is provided on this After Visit Summary.  MyChart is used to connect with patients for Virtual Visits (Telemedicine).  Patients are able to view lab/test results, encounter notes, upcoming appointments, etc.  Non-urgent messages can be sent to your provider as well.   To learn more about what you can do with MyChart, go to [email or web address].    Your next appointment:   Keep your follow up as scheduled in December PLEASE DO A TRANSMISSION ON 9/25 Other Instructions  You are scheduled for a Cardioversion on 09/16/22 with Dr. Sallyanne Kuster.  Please arrive at the [facility] (Main Entrance A) at [facility]: [address] [place], [place] [address] at 8:30 am. (1 hour prior to procedure)  DIET: Nothing to eat or drink after midnight except a sip of water with medications (see medication instructions below)  FYI: For your safety, and to allow [place] to monitor your vital signs accurately during the surgery/procedure we request that   if you have artificial nails, gel coating, SNS etc. Please have those removed prior to your surgery/procedure. Not having the nail coverings /polish removed may result in cancellation or delay of your surgery/procedure.   Medication Instructions: Hold Furosemide the morning of the procedure  Continue your anticoagulant: Xarelto You will need to continue your anticoagulant after your procedure until you  are told by your  Provider that it is safe to stop   Labs: Your provider would like for you to return on September 20th to have the following labs drawn: CBC and BMET. You do not need an appointment for the lab. Once in our office lobby there is a podium where you can sign in and ring the doorbell to alert [place] that you are here. The lab is open from 8:00 am to 4 pm; closed for lunch from 12:45pm-1:45pm.  You may also go to any of these [facility] locations:   [place] ([place]) - [address] [place] must have a responsible person to drive you home and stay in the waiting area during your procedure. Failure to do so could result in cancellation.  Bring your insurance cards.  *Special Note: Every effort is made to have your procedure done on time. Occasionally there are emergencies that occur at the hospital that may cause delays. Please be patient if a delay does occur.      Signed, Sanda Klein, MD  09/04/2022 11:39 AM    [facility] [place], [place], [place]  [address] Phone: [phone]; Fax: [phone]

## 2022-09-04 NOTE — Patient Instructions (Signed)
Medication Instructions:  No changes *If you need a refill on your cardiac medications before your next appointment, please call your pharmacy*   Lab Work: None ordered If you have labs (blood work) drawn today and your tests are completely normal, you will receive your results only by: MyChart Message (if you have MyChart) OR A paper copy in the mail If you have any lab test that is abnormal or we need to change your treatment, we will call you to review the results.   Follow-Up: At [facility], you and your health needs are our priority.  As part of our continuing mission to provide you with exceptional heart care, we have created designated Provider Care Teams.  These Care Teams include your primary Cardiologist (physician) and Advanced Practice Providers (APPs -  Physician Assistants and Nurse Practitioners) who all work together to provide you with the care you need, when you need it.  We recommend signing up for the patient portal called "MyChart".  Sign up information is provided on this After Visit Summary.  MyChart is used to connect with patients for Virtual Visits (Telemedicine).  Patients are able to view lab/test results, encounter notes, upcoming appointments, etc.  Non-urgent messages can be sent to your provider as well.   To learn more about what you can do with MyChart, go to [email or web address].    Your next appointment:   Keep your follow up as scheduled in December PLEASE DO A TRANSMISSION ON 9/25 Other Instructions  You are scheduled for a Cardioversion on 09/16/22 with Dr. Royann Shivers.  Please arrive at the [facility] (Main Entrance A) at [facility]: [address] [place], [place] [address] at 8:30 am. (1 hour prior to procedure)  DIET: Nothing to eat or drink after midnight except a sip of water with medications (see medication instructions below)  FYI: For your safety, and to allow [place] to monitor your vital signs accurately during the  surgery/procedure we request that   if you have artificial nails, gel coating, SNS etc. Please have those removed prior to your surgery/procedure. Not having the nail coverings /polish removed may result in cancellation or delay of your surgery/procedure.   Medication Instructions: Hold Furosemide the morning of the procedure  Continue your anticoagulant: Xarelto You will need to continue your anticoagulant after your procedure until you  are told by your  Provider that it is safe to stop   Labs: Your provider would like for you to return on September 20th to have the following labs drawn: CBC and BMET. You do not need an appointment for the lab. Once in our office lobby there is a podium where you can sign in and ring the doorbell to alert [place] that you are here. The lab is open from 8:00 am to 4 pm; closed for lunch from 12:45pm-1:45pm.  You may also go to any of these [facility] locations:   [facility] - [address] ([facility]) - [address] [phone] [address]. [address]    You must have a responsible person to drive you home and stay in the waiting area during your procedure. Failure to do so could result in cancellation.  Bring your insurance cards.  *Special Note: Every effort is made to have your procedure done on time. Occasionally there are emergencies that occur at the hospital that may cause delays. Please be patient if a delay does occur.

## 2022-09-07 ENCOUNTER — Emergency Department (HOSPITAL_BASED_OUTPATIENT_CLINIC_OR_DEPARTMENT_OTHER): Payer: Medicare Other | Admitting: Radiology

## 2022-09-07 ENCOUNTER — Other Ambulatory Visit: Payer: Self-pay

## 2022-09-07 ENCOUNTER — Emergency Department (HOSPITAL_BASED_OUTPATIENT_CLINIC_OR_DEPARTMENT_OTHER): Payer: Medicare Other

## 2022-09-07 ENCOUNTER — Emergency Department (HOSPITAL_BASED_OUTPATIENT_CLINIC_OR_DEPARTMENT_OTHER)
Admission: EM | Admit: 2022-09-07 | Discharge: 2022-09-08 | Disposition: A | Discharge status: Home / Self Care | Payer: Medicare Other | Attending: Emergency Medicine | Admitting: Emergency Medicine

## 2022-09-07 ENCOUNTER — Encounter (HOSPITAL_BASED_OUTPATIENT_CLINIC_OR_DEPARTMENT_OTHER): Payer: Self-pay

## 2022-09-07 DIAGNOSIS — S2241XA Multiple fractures of ribs, right side, initial encounter for closed fracture: Secondary | ICD-10-CM | POA: Diagnosis not present

## 2022-09-07 DIAGNOSIS — W19XXXA Unspecified fall, initial encounter: Secondary | ICD-10-CM

## 2022-09-07 DIAGNOSIS — Z79899 Other long term (current) drug therapy: Secondary | ICD-10-CM | POA: Diagnosis not present

## 2022-09-07 DIAGNOSIS — R4182 Altered mental status, unspecified: Secondary | ICD-10-CM | POA: Insufficient documentation

## 2022-09-07 DIAGNOSIS — S299XXA Unspecified injury of thorax, initial encounter: Secondary | ICD-10-CM | POA: Diagnosis present

## 2022-09-07 DIAGNOSIS — W01198A Fall on same level from slipping, tripping and stumbling with subsequent striking against other object, initial encounter: Secondary | ICD-10-CM | POA: Insufficient documentation

## 2022-09-07 NOTE — ED Provider Notes (Signed)
[facility] Provider Note   CSN: [id number] Arrival date & time: 09/07/22  1901     History  Chief Complaint  Patient presents with   [name] Baird is a 88 y.o. female with a PMHx of atrial flutter, who presents to the Emergency Department complaining of a fall onset prior to arrival.  Patient notes that she stood up out of her recliner and fell which caused her to hit the right side of her head.  No dizziness or lightheadedness prior to the fall. Didn't trip on anything. Pt has associated bilateral ribs.  No meds tried prior to arrival.  Denies LOC, headache, nausea, vomiting, chest pain, shortness of breath, dizziness, lightheadedness, abdominal pain, nausea, vomiting, back pain, neck pain.  Patient is currently on Xarelto due to her atrial flutter with a cardioversion scheduled on 09/16/2022.      The history is provided by the patient. No language interpreter was used.       Home Medications Prior to Admission medications   Medication Sig Start Date End Date Taking? Authorizing Provider  oxyCODONE-acetaminophen (PERCOCET/ROXICET) 5-325 MG tablet Take 0.5 tablets by mouth every 8 (eight) hours as needed for severe pain. 09/08/22  Yes Ariah Mower A, PA-C  acetaminophen (TYLENOL) 650 MG CR tablet Take 1,300 mg by mouth daily as needed for pain.  Patient not taking: Reported on 09/04/2022    [provider]  bacitracin ophthalmic ointment Place 1 application. into the left eye at bedtime. 09/10/20   [provider]  calcium-vitamin D (OSCAL) 250-125 MG-UNIT per tablet Take 1 tablet by mouth daily.    [provider]  candesartan (ATACAND) 8 MG tablet TAKE 1 TABLET BY MOUTH EVERY DAY 07/16/22   Croitoru, Dani Gobble, MD  clonazePAM (KLONOPIN) 0.5 MG tablet Take half tablet every other day as needed, for anxiety Patient taking differently: Take 1/4 tablet every day as needed, for anxiety 03/06/22   Paseda, Lillie Columbia R,  FNP  dorzolamide-timolol (COSOPT) 22.3-6.8 MG/ML ophthalmic solution Place 2 drops into both eyes 2 (two) times daily.    [provider]  erythromycin ophthalmic ointment SMARTSIG:1 sparingly Left Eye Every Night 01/29/22   [provider]  famotidine (PEPCID) 20 MG tablet Take 20 mg by mouth daily.    [provider]  furosemide (LASIX) 40 MG tablet Take 80mg  by mouth on Monday, Wednesday and Friday Take 40mg  by mouth on Tuesday, Thursday Saturday and Sunday 03/26/22   Sherran Needs, NP  latanoprost (XALATAN) 0.005 % ophthalmic solution Place 1 drop into both eyes at bedtime.    [provider]  Multiple Vitamins-Minerals (MULTIVITAMIN PO) Take 1 tablet by mouth daily.     [provider]  nebivolol (BYSTOLIC) 10 MG tablet TAKE 1 TABLET BY MOUTH EVERY DAY 08/10/22   Croitoru, Dani Gobble, MD  potassium chloride (KLOR-CON M10) 10 MEQ tablet Take 23meq by mouth Tuesday, Thursday Saturday and Sunday Take 22meq by mouth on Monday Wednesday and Friday 03/26/22   Sherran Needs, NP  RHOPRESSA 0.02 % SOLN Place 1 drop into the right eye every other day. 09/13/20   [provider]  rosuvastatin (CRESTOR) 10 MG tablet TAKE 1 TABLET BY MOUTH EVERYDAY AT BEDTIME 02/24/22   Paseda, Dewaine Conger, FNP  traMADol (ULTRAM) 50 MG tablet Take 50 mg by mouth daily as needed.    [provider]  UNABLE TO FIND Med Name: Heel protector boot for right ankle Apply at bedtime  to promote healing of your pressure ulcer. 03/07/21   Heather RobertsGray, Joseph M, NP  venlafaxine XR (EFFEXOR-XR) 75 MG 24 hr capsule TAKE 1 CAPSULE BY MOUTH EVERY DAY 04/21/22   Paseda, Folashade R, FNP  XARELTO 15 MG TABS tablet TAKE 1 TABLET (15 MG TOTAL) BY MOUTH DAILY WITH SUPPER 06/15/22   Croitoru, Mihai, MD      Allergies    Sulfa antibiotics and Hydrocodone    Review of Systems   Review of Systems  Respiratory:  Negative for shortness of breath.   Cardiovascular:  Negative for chest pain.   Gastrointestinal:  Negative for abdominal pain, nausea and vomiting.  Musculoskeletal:  Positive for arthralgias. Negative for back pain and neck pain.  Neurological:  Negative for dizziness, syncope and light-headedness.  All other systems reviewed and are negative.   Physical Exam Updated Vital Signs BP (!) 143/61   Pulse 76   Temp 97.6 F (36.4 C)   Resp 17   Ht 5\' 2"  (1.575 m)   Wt 70.3 kg   SpO2 100%   BMI 28.35 kg/m  Physical Exam Vitals and nursing note reviewed.  Constitutional:      General: She is not in acute distress.    Appearance: Normal appearance.  Eyes:     General: No scleral icterus.    Extraocular Movements: Extraocular movements intact.  Cardiovascular:     Rate and Rhythm: Normal rate. Rhythm irregularly irregular.  Pulmonary:     Effort: Pulmonary effort is normal. No respiratory distress.  Musculoskeletal:     Cervical back: Neck supple.     Comments: tenderness to palpation noted to right lateral ribs with bruising noted to the area.  No spinal tenderness to palpation.  No chest wall tenderness to palpation.  No tenderness to palpation noted to right sided head.   Skin:    General: Skin is warm and dry.     Findings: No bruising, erythema or rash.  Neurological:     Mental Status: She is alert.  Psychiatric:        Behavior: Behavior normal.     ED Results / Procedures / Treatments   Labs (all labs ordered are listed, but only abnormal results are displayed) Labs Reviewed - No data to display  EKG None  Radiology DG Ribs Unilateral W/Chest Right  Result Date: 09/07/2022 CLINICAL DATA:  Fall out of recliner striking right side.  Bruising. EXAM: RIGHT RIBS AND CHEST - 3+ VIEW COMPARISON:  Chest radiograph 07/11/2022, CT 04/14/2028 FINDINGS: Suspected nondisplaced right lateral eighth and ninth rib fractures. There is no evidence of pneumothorax or pleural effusion. Pacemaker in place, prior median sternotomy. 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 IMPRESSION: Suspected nondisplaced right lateral eighth and ninth rib fractures. No pneumothorax or pulmonary complication. Electronically Signed   By: Narda RutherfordMelanie  Sanford M.D.   On: 09/07/2022 20:11   CT HEAD WO CONTRAST (5MM)  Result Date: 09/07/2022 CLINICAL DATA:  Recent fall from chair with headaches and neck pain, initial encounter EXAM: CT HEAD WITHOUT CONTRAST CT CERVICAL SPINE WITHOUT CONTRAST TECHNIQUE: Multidetector CT imaging of the head and cervical spine was performed following the standard protocol without intravenous contrast. Multiplanar CT image reconstructions of the cervical spine were also generated. RADIATION DOSE REDUCTION: This exam was performed according to the departmental dose-optimization program which includes automated exposure control, adjustment of the mA and/or kV according to patient size and/or use  of iterative reconstruction technique. COMPARISON:  None Available. FINDINGS: CT HEAD FINDINGS Brain: No evidence of acute infarction, hemorrhage, hydrocephalus, extra-axial collection or mass lesion/mass effect. Chronic atrophic and ischemic changes are noted. Vascular: No hyperdense vessel or unexpected calcification. Skull: Normal. Negative for fracture or focal lesion. Postsurgical changes in the left posterior cranial fossa are seen laterally. Sinuses/Orbits: No acute finding. Other: None. CT CERVICAL SPINE FINDINGS Alignment: Within normal limits. Skull base and vertebrae: 7 cervical segments are well visualized. Vertebral body height is well maintained. Multilevel disc space narrowing is noted with mild osteophytic change. Facet hypertrophic changes are noted as well. Surgical changes in the posterior cranial fossa on the left no acute fracture or acute facet abnormality is noted. Soft tissues and spinal canal: Surrounding soft tissue structures show vascular  calcifications. No focal hematoma is noted. Upper chest: Visualized lung apices are within normal limits. Scarring in the apices is seen. Other: None IMPRESSION: CT of the head: Chronic atrophic and ischemic changes without acute abnormality. CT of the cervical spine: Multilevel degenerative change without acute abnormality. Electronically Signed   By: Alcide Clever M.D.   On: 09/07/2022 20:08   CT CERVICAL SPINE WO CONTRAST  Result Date: 09/07/2022 CLINICAL DATA:  Recent fall from chair with headaches and neck pain, initial encounter EXAM: CT HEAD WITHOUT CONTRAST CT CERVICAL SPINE WITHOUT CONTRAST TECHNIQUE: Multidetector CT imaging of the head and cervical spine was performed following the standard protocol without intravenous contrast. Multiplanar CT image reconstructions of the cervical spine were also generated. RADIATION DOSE REDUCTION: This exam was performed according to the departmental dose-optimization program which includes automated exposure control, adjustment of the mA and/or kV according to patient size and/or use of iterative reconstruction technique. COMPARISON:  None Available. FINDINGS: CT HEAD FINDINGS Brain: No evidence of acute infarction, hemorrhage, hydrocephalus, extra-axial collection or mass lesion/mass effect. Chronic atrophic and ischemic changes are noted. Vascular: No hyperdense vessel or unexpected calcification. Skull: Normal. Negative for fracture or focal lesion. Postsurgical changes in the left posterior cranial fossa are seen laterally. Sinuses/Orbits: No acute finding. Other: None. CT CERVICAL SPINE FINDINGS Alignment: Within normal limits. Skull base and vertebrae: 7 cervical segments are well visualized. Vertebral body height is well maintained. Multilevel disc space narrowing is noted with mild osteophytic change. Facet hypertrophic changes are noted as well. Surgical changes in the posterior cranial fossa on the left no acute fracture or acute facet abnormality is  noted. Soft tissues and spinal canal: Surrounding soft tissue structures show vascular calcifications. No focal hematoma is noted. Upper chest: Visualized lung apices are within normal limits. Scarring in the apices is seen. Other: None IMPRESSION: CT of the head: Chronic atrophic and ischemic changes without acute abnormality. CT of the cervical spine: Multilevel degenerative change without acute abnormality. Electronically Signed   By: Alcide Clever M.D.   On: 09/07/2022 20:08    Procedures Procedures    Medications Ordered in ED Medications  oxyCODONE-acetaminophen (PERCOCET/ROXICET) 5-325 MG per tablet 0.5 tablet (has no administration in time range)    ED Course/ Medical Decision Making/ A&P Clinical Course as of 09/08/22 0057  Tue Sep 08, 2022  0023 Attending, Dr. Read Drivers in to evaluate patient agrees with treatment plan. Answered all available questions. Pt appears safe for discharge. [SB]    Clinical Course User Index [SB] Heru Montz A, PA-C  Medical Decision Making Amount and/or Complexity of Data Reviewed Radiology: ordered.  Risk Prescription drug management.   Pt with fall occurring prior to arrival. Denies LOC, dizziness, lightheadedness. Vital signs and afebrile. On exam, patient with tenderness to palpation noted to right lateral ribs with bruising noted to the area.  No spinal tenderness to palpation.  No chest wall tenderness to palpation.  No tenderness to palpation noted to right sided head.  Irregularly irregular rhythm noted on exam today otherwise no acute cardiovascular respiratory exam findings. Differential diagnosis includes fracture, dislocation, herniation, intracranial abnormality.   Additional history obtained:  Additional history obtained from Daughter/Son    Imaging: I ordered imaging studies including Chest x-ray CT head without CT cervical spine I independently visualized and interpreted imaging which showed: No acute  findings on CT of head or cervical spine.  Chest x-ray notable for nondisplaced fractures of the eighth and ninth ribs. I agree with the radiologist interpretation  Medications:  I ordered medication including Percocet for pain management I have reviewed the patients home medicines and have made adjustments as needed  Disposition: Presentation suspicious for closed fracture multiple ribs on the right side.  Doubt pneumothorax at this time.  Doubt intracranial abnormality or fracture or herniation of the cervical spine. After consideration of the diagnostic results and the patients response to treatment, I feel that the patient would benefit from Discharge home.  Patient will be provided with an incentive spirometer today.  PDMP reviewed, patient sent with a short course of Percocet.  Instructed patient to follow-up with primary care provider.  Instructed patient to continue with Xarelto as prescribed.  Supportive care measures and strict return precautions discussed with family at bedside.  Answered all available questions.  Patient appears safe for discharge at this time.  Follow-up as indicated discharge paperwork.   This chart was dictated using voice recognition software, Dragon. Despite the best efforts of this provider to proofread and correct errors, errors may still occur which can change documentation meaning.   Final Clinical Impression(s) / ED Diagnoses Final diagnoses:  Fall, initial encounter  Closed fracture of multiple ribs of right side, initial encounter    Rx / DC Orders ED Discharge Orders          Ordered    oxyCODONE-acetaminophen (PERCOCET/ROXICET) 5-325 MG tablet  Every 8 hours PRN        09/08/22 0047              Mckenley Birenbaum A, PA-C 09/08/22 0101    Molpus, Jonny Ruiz, MD 09/08/22 [phone]

## 2022-09-07 NOTE — ED Triage Notes (Signed)
Pt states she fell out of her recliner hitting her rt. Side mainly rib area.  + bruising.  Approx 1630   Hx of atrial flutter scheduled for cardioversion on 9/27. Pt is on xeralto

## 2022-09-08 ENCOUNTER — Encounter: Payer: Self-pay | Admitting: Cardiovascular Disease

## 2022-09-08 MED ORDER — OXYCODONE-ACETAMINOPHEN 5-325 MG PO TABS
0.5000 | ORAL_TABLET | Freq: Once | ORAL | Status: AC
  Administered 2022-09-08: 0.5 via ORAL
  Filled 2022-09-08: qty 1

## 2022-09-08 MED ORDER — OXYCODONE-ACETAMINOPHEN 5-325 MG PO TABS: 0.5000 | ORAL_TABLET | Freq: Three times a day (TID) | ORAL | 0 refills | Status: DC | PRN

## 2022-09-08 NOTE — Telephone Encounter (Signed)
I think it is important to continue taking the Xarelto as long as there is no active bleeding.  I think the ER physician is correct and that she is indeed back in normal rhythm.  Therefore, the labs are not necessary and we can cancel the cardioversion.

## 2022-09-08 NOTE — Discharge Instructions (Addendum)
It was a pleasure taking care of you today!  The CT of your head did not show any bleeding on the brain.  Your CT of your neck did not show any fractures (breaks in bone).  The x-ray of your chest showed concerns for rib fractures on the right side.  You may continue taking your Xarelto as prescribed.  Follow-up with your primary care provider as needed.  Use the incentive spirometer as directed by the respiratory therapist in the emergency department here tonight.  You will be sent a prescription for Ultram, take as directed.  Return to the ED if you are experiencing increasing/worsening symptoms.

## 2022-09-09 ENCOUNTER — Other Ambulatory Visit: Payer: Self-pay | Admitting: Nurse Practitioner

## 2022-09-09 DIAGNOSIS — F320 Major depressive disorder, single episode, mild: Secondary | ICD-10-CM

## 2022-09-16 ENCOUNTER — Ambulatory Visit (HOSPITAL_COMMUNITY): Admit: 2022-09-16 | Payer: Medicare Other | Admitting: Cardiovascular Disease

## 2022-09-16 ENCOUNTER — Encounter (HOSPITAL_COMMUNITY): Payer: Self-pay

## 2022-09-16 SURGERY — CARDIOVERSION
Anesthesia: General

## 2022-09-17 NOTE — Progress Notes (Signed)
Remote pacemaker transmission.   

## 2022-11-08 ENCOUNTER — Other Ambulatory Visit: Payer: Self-pay | Admitting: Cardiovascular Disease

## 2022-11-23 ENCOUNTER — Ambulatory Visit: Payer: Medicare Other | Attending: Cardiovascular Disease | Admitting: Cardiovascular Disease

## 2022-11-23 ENCOUNTER — Encounter: Payer: Self-pay | Admitting: Cardiovascular Disease

## 2022-11-23 VITALS — BP 120/72 | HR 72 | Ht 62.0 in | Wt 158.6 lb

## 2022-11-23 DIAGNOSIS — I281 Aneurysm of pulmonary artery: Secondary | ICD-10-CM

## 2022-11-23 DIAGNOSIS — I5032 Chronic diastolic (congestive) heart failure: Secondary | ICD-10-CM

## 2022-11-23 DIAGNOSIS — I442 Atrioventricular block, complete: Secondary | ICD-10-CM

## 2022-11-23 DIAGNOSIS — I484 Atypical atrial flutter: Secondary | ICD-10-CM | POA: Diagnosis not present

## 2022-11-23 DIAGNOSIS — Z950 Presence of cardiac pacemaker: Secondary | ICD-10-CM | POA: Diagnosis not present

## 2022-11-23 DIAGNOSIS — E7800 Pure hypercholesterolemia, unspecified: Secondary | ICD-10-CM

## 2022-11-23 DIAGNOSIS — Z8774 Personal history of (corrected) congenital malformations of heart and circulatory system: Secondary | ICD-10-CM

## 2022-11-23 DIAGNOSIS — D6869 Other thrombophilia: Secondary | ICD-10-CM

## 2022-11-23 DIAGNOSIS — Q221 Congenital pulmonary valve stenosis: Secondary | ICD-10-CM

## 2022-11-23 DIAGNOSIS — Z862 Personal history of diseases of the blood and blood-forming organs and certain disorders involving the immune mechanism: Secondary | ICD-10-CM

## 2022-11-23 NOTE — Patient Instructions (Signed)
Medication Instructions:  Continue same medications *If you need a refill on your cardiac medications before your next appointment, please call your pharmacy*   Lab Work: None ordered   Testing/Procedures: None ordered   Follow-Up: At [facility], you and your health needs are our priority.  As part of our continuing mission to provide you with exceptional heart care, we have created designated Provider Care Teams.  These Care Teams include your primary Cardiologist (physician) and Advanced Practice Providers (APPs -  Physician Assistants and Nurse Practitioners) who all work together to provide you with the care you need, when you need it.  We recommend signing up for the patient portal called "MyChart".  Sign up information is provided on this After Visit Summary.  MyChart is used to connect with patients for Virtual Visits (Telemedicine).  Patients are able to view lab/test results, encounter notes, upcoming appointments, etc.  Non-urgent messages can be sent to your provider as well.   To learn more about what you can do with MyChart, go to [email or web address].    Your next appointment:  3 to 4 months    The format for your next appointment: Office   Provider:  Dr.Croitoru   Important Information About Sugar

## 2022-11-23 NOTE — Progress Notes (Signed)
Patient ID: [name] Baird, female   DOB: 06-Mar-1933, 88 y.o.   MRN: [id number]     Cardiology Office Note    Date:  11/28/2022   ID:  [name], DOB 05/04/1933, MRN [id number]  PCP:  Irven Coe, MD  Cardiologist:   Thurmon Fair, MD   Chief Complaint  Patient presents with   Fatigue    History of Present Illness:  [name] Baird is a 88 y.o. female with a history of congenital heart disease (atrial septal defect status post patch closure), pulmonic stenosis (status post valvotomy 1985), left pulmonary artery aneurysm, complete heart block status post dual-chamber permanent pacemaker implantation (Medtronic, last generator change 2014) and infrequent paroxysmal atrial fibrillation (atrial flutter cavotricuspid isthmus ablation 2014). She has history of diastolic heart failure with infrequent exacerbation, most recently following an episode of lower GI bleeding with severe anemia in March 2021.  Xarelto was switched to Eliquis after that episode of bleeding.  Atypical atrial flutter continues to be a recurrent problem.  She had successful overdrive pacing of the arrhythmia in April 2023 with improvement in her symptoms of fatigue and reduced stamina.  Will try to do this again for a lengthy episode of atrial flutter in August, unsuccessfully.  However, when she presented for cardioversion on September 27 she was back in normal rhythm spontaneously.  Pacemaker interrogation today again shows that she was in atrial flutter for the last approximately 5 days.  The cycle length is about 330 ms, very similar to the previous episodes in April and August.  We tried overdrive pace in the office again today.  This accelerated the flutter to a different cycle length of about 260 ms and eventually cause deterioration to atrial fibrillation.  She remains quite sedentary, but denies orthopnea, PND, lower extremity edema or chest pain.  Has not required recent adjustments in her diuretic.   On her home scale her weight remains stable in the 155-156 pounds range, a couple of pounds higher in the office.  She has not had syncope.  She has a lot of pain in the right shoulder and is seeing an orthopedic specialist in [place].  Has not had any falls injuries or bleeding problems.  Compliant with anticoagulation with Xarelto.  She has rarely required any adjustments in her diuretic dose (usually takes furosemide 80 mg 3 days a week and 40 mg 4 days a week).  Her weight has drifted down slightly over the last few years.  Currently appears to be stable at about 155-156 pounds.  Otherwise pacemaker function is normal.  When not in atrial flutter she has only 70% atrial pacing with good heart rate histogram distribution.  She is pacemaker dependent with 100% ventricular pacing.  There is occasional under sensing/blanking of the flutter waves.  Her pacemaker leads are the original ones from 2007 but still have excellent parameters.    In May 2019 she saw Dr. Lowanda Foster Zwischenberger at [facility] to discuss treatment for pulmonary artery aneurysm.  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  Conservative management was recommended.  Echo performed there showed a left ventricular ejection fraction of 45-50%, severe left atrial enlargement, moderate regurgitation of all 4 cardiac valves, in addition to the dilated pulmonary artery and left pulmonary artery aneurysm.  In April 2017 she had one episode that lasted for 6 hours.  In March 2018 the atrial fibrillation only lasted for just over  2 minutes.  She had a 7 beat run of nonsustained ventricular tachycardia in March 2020.  She had atrial flutter ablation performed by Dr. Johney Frame in July 2014 and is not taking any antiarrhythmics at this time, other than a low dose of beta blocker. At the time of the EP study she was noted to have conventional counterclockwise isthmus-dependent right  atrial flutter but then a second type of right atrial flutter was noted briefly. It could not be reinduced for full study.  She has done poorly on conventional beta-blockers (atenolol, metoprolol), but tolerates Bystolic well.   Past Medical History:  Diagnosis Date   Acute GI bleeding 03/06/2020   Anxiety    Atrial flutter (HCC)    s/p ablation 06/29/2013 by Dr Johney Frame   CHF (congestive heart failure) (HCC)    Complete heart block (HCC) 2007   s/p medtronic pacemaker implantation   Depression    Glaucoma    Hypertension    Pulmonary stenosis sp valvotomy        Shortness of breath    Status post patch closure of ASD     Past Surgical History:  Procedure Laterality Date   ABLATION  06/29/2013   s/p isthmus ablation for atrial flutter by Dr Johney Frame   AORTIC VALVE REPAIR     ATRIAL FLUTTER ABLATION N/A 06/29/2013   Procedure: ATRIAL FLUTTER ABLATION;  Surgeon: Hillis Range, MD;  Location: [facility] CATH LAB;  Service: Cardiovascular;  Laterality: N/A;   CARDIAC CATHETERIZATION N/A 06/27/2015   Procedure: Temporary Pacemaker;  Surgeon: Thurmon Fair, MD;  Location: [facility] INVASIVE CV LAB;  Service: Cardiovascular;  Laterality: N/A;   CARDIOVERSION N/A 03/24/2013   Procedure: CARDIOVERSION;  Surgeon: Thurmon Fair, MD;  Location: [facility] ENDOSCOPY;  Service: Cardiovascular;  Laterality: N/A;   COLONOSCOPY WITH PROPOFOL Left 03/08/2020   Procedure: COLONOSCOPY WITH PROPOFOL;  Surgeon: Willis Modena, MD;  Location: [facility] ENDOSCOPY;  Service: Endoscopy;  Laterality: Left;   EP IMPLANTABLE DEVICE N/A 06/27/2015   Procedure: PPM Generator Changeout;  Surgeon: Thurmon Fair, MD;  Location: [facility] INVASIVE CV LAB;  Service: Cardiovascular;  Laterality: N/A;   ESOPHAGOGASTRODUODENOSCOPY (EGD) WITH PROPOFOL Left 03/07/2020   Procedure: ESOPHAGOGASTRODUODENOSCOPY (EGD) WITH PROPOFOL;  Surgeon: Willis Modena, MD;  Location: [facility] ENDOSCOPY;  Service: Endoscopy;  Laterality: Left;   PACEMAKER INSERTION  2007   Medtronic  Adapta ADDRL1, serial # [id number]   pulmonary valvotomy  1985   trigeminal sx     left side    Outpatient Medications Prior to Visit  Medication Sig Dispense Refill   acetaminophen (TYLENOL) 650 MG CR tablet Take 1,300 mg by mouth daily as needed for pain.     bacitracin ophthalmic ointment Place 1 application. into the left eye at bedtime.     calcium-vitamin D (OSCAL) 250-125 MG-UNIT per tablet Take 1 tablet by mouth daily.     candesartan (ATACAND) 8 MG tablet TAKE 1 TABLET BY MOUTH EVERY DAY 90 tablet 3   clonazePAM (KLONOPIN) 0.5 MG tablet Take half tablet every other day as needed, for anxiety (Patient taking differently: Take 1/4 tablet every day as needed, for anxiety) 15 tablet 0   dorzolamide-timolol (COSOPT) 22.3-6.8 MG/ML ophthalmic solution Place 2 drops into both eyes 2 (two) times daily.     erythromycin ophthalmic ointment SMARTSIG:1 sparingly Left Eye Every Night     famotidine (PEPCID) 20 MG tablet Take 20 mg by mouth daily.     furosemide (LASIX) 40 MG tablet Take 80mg  by mouth on Monday, Wednesday  and Friday Take  by mouth on Tuesday, Thursday Saturday and Sunday     latanoprost (XALATAN) 0.005 % ophthalmic solution Place 1 drop into both eyes at bedtime.     Multiple Vitamins-Minerals (MULTIVITAMIN PO) Take 1 tablet by mouth daily.      nebivolol (BYSTOLIC) 10 MG tablet TAKE 1 TABLET BY MOUTH EVERY DAY 90 tablet 3   nystatin (MYCOSTATIN/NYSTOP) powder Apply 1 Application topically 2 (two) times daily.     potassium chloride (KLOR-CON M10) 10 MEQ tablet TAKE 2 TABLETS BY MOUTH TWICE A DAY 360 tablet 3   RHOPRESSA 0.02 % SOLN Place 1 drop into the right eye every other day.     rosuvastatin (CRESTOR) 10 MG tablet TAKE 1 TABLET BY MOUTH EVERYDAY AT BEDTIME 90 tablet 1   traMADol (ULTRAM) 50 MG tablet Take 50 mg by mouth daily as needed.     UNABLE TO FIND Med Name: Heel protector boot for right ankle Apply at bedtime to promote healing of your pressure ulcer. 1 each 0    venlafaxine XR (EFFEXOR-XR) 75 MG 24 hr capsule TAKE 1 CAPSULE BY MOUTH EVERY DAY 90 capsule 1   XARELTO 15 MG TABS tablet TAKE 1 TABLET (15 MG TOTAL) BY MOUTH DAILY WITH SUPPER 90 tablet 1   oxyCODONE-acetaminophen (PERCOCET/ROXICET) 5-325 MG tablet Take 0.5 tablets by mouth every 8 (eight) hours as needed for severe pain. (Patient not taking: Reported on 11/23/2022) 6 tablet 0   No facility-administered medications prior to visit.     Allergies:   Sulfa antibiotics and Hydrocodone   Social History   Socioeconomic History   Marital status: Widowed    Spouse name: Not on file   Number of children: 2   Years of education: Not on file   Highest education level: Not on file  Occupational History   Occupation: retired/homemaker  Tobacco Use   Smoking status: Never   Smokeless tobacco: Never  Vaping Use   Vaping Use: Never used  Substance and Sexual Activity   Alcohol use: No   Drug use: No   Sexual activity: Never  Other Topics Concern   Not on file  Social History Narrative   Not on file   Social Determinants of Health   Financial Resource Strain: Low Risk  (05/12/2021)   Overall Financial Resource Strain (CARDIA)    Difficulty of Paying Living Expenses: Not hard at all  Food Insecurity: No Food Insecurity (05/12/2021)   Hunger Vital Sign    Worried About Running Out of Food in the Last Year: Never true    Ran Out of Food in the Last Year: Never true  Transportation Needs: No Transportation Needs (05/12/2021)   PRAPARE - Administrator, Civil Service (Medical): No    Lack of Transportation (Non-Medical): No  Physical Activity: Insufficiently Active (05/12/2021)   Exercise Vital Sign    Days of Exercise per Week: 3 days    Minutes of Exercise per Session: 30 min  Stress: No Stress Concern Present (05/12/2021)   [employer] of Occupational Health - Occupational Stress Questionnaire    Feeling of Stress : Not at all  Social Connections: Moderately Isolated  (05/12/2021)   Social Connection and Isolation Panel [NHANES]    Frequency of Communication with Friends and Family: More than three times a week    Frequency of Social Gatherings with Friends and Family: More than three times a week    Attends Religious Services: More than 4 times per year  Active Member of Clubs or Organizations: No    Attends Banker Meetings: Never    Marital Status: Widowed    ROS:   Please see the history of present illness.    All other systems are reviewed and are negative.    PHYSICAL EXAM:   VS:  BP 120/72 (BP Location: Left Arm, Patient Position: Sitting, Cuff Size: Normal)   Pulse 72   Ht 5\' 2"  (1.575 m)   Wt 71.9 kg   SpO2 98%   BMI 29.01 kg/m      General: Alert, oriented x3, no distress, healthy pacemaker site Head: no evidence of trauma, partial surgical fusion of her right eyelid, PERRL, EOMI, no exophtalmos or lid lag, no myxedema, no xanthelasma; normal ears, nose and oropharynx Neck: normal jugular venous pulsations and no hepatojugular reflux; brisk carotid pulses without delay and no carotid bruits Chest: clear to auscultation, no signs of consolidation by percussion or palpation, normal fremitus, symmetrical and full respiratory excursions Cardiovascular: normal position and quality of the apical impulse, regular rhythm, normal first and second heart sounds, 2/6 systolic murmur and 1/6 diastolic murmur heard best at the left upper sternal border, no rubs or gallops Abdomen: no tenderness or distention, no masses by palpation, no abnormal pulsatility or arterial bruits, normal bowel sounds, no hepatosplenomegaly Extremities: no clubbing, cyanosis or edema; 2+ radial, ulnar and brachial pulses bilaterally; 2+ right femoral, posterior tibial and dorsalis pedis pulses; 2+ left femoral, posterior tibial and dorsalis pedis pulses; no subclavian or femoral bruits Neurological: grossly nonfocal Psych: Normal mood and affect     Wt  Readings from Last 3 Encounters:  11/23/22 71.9 kg  09/07/22 70.3 kg  09/04/22 70.8 kg    Studies/Labs Reviewed:   ECHO 10/24/2020:  1. Left ventricular ejection fraction, by estimation, is 45 to 50%. The  left ventricle has mildly decreased function. The left ventricle  demonstrates regional wall motion abnormalities with septal-lateral  dyssynchrony consistent with RV pacing. Left  ventricular diastolic parameters are consistent with Grade I diastolic  dysfunction (impaired relaxation).   2. Right ventricular systolic function is mildly reduced. The right  ventricular size is mildly enlarged. There is mildly elevated pulmonary  artery systolic pressure. The estimated right ventricular systolic  pressure is 37.1 mmHg.   3. Left atrial size was mildly dilated.   4. Right atrial size was mildly dilated.   5. Tricuspid valve regurgitation is moderate.   6. The aortic valve is tricuspid. Aortic valve regurgitation is mild.  Mild aortic valve sclerosis is present, with no evidence of aortic valve  stenosis.   7. Thickened and doming pulmonary valve. There does not appear to be  significant stenosis. There is mild to moderate pulmonic insufficiency.  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   8. Aortic dilatation noted. There is mild dilatation of the ascending  aorta, measuring 40 mm.   9. The inferior vena cava is normal in size with greater than 50%  respiratory variability, suggesting right atrial pressure of 3 mmHg.  10. The mitral valve is normal in structure. Trivial mitral valve  regurgitation.   EKG:  EKG is ordered today and shows background atrial flutter, occasional atrial under sensing and subsequent atrial pacing, 100% ventricular paced rhythm.  The paced QRS is  broad 154 ms but has a positive R wave in lead V1.   Lipid Panel     Component Value Date/Time   CHOL 138 10/07/2021 0817   TRIG  144 10/07/2021 0817   HDL 53 10/07/2021 0817   CHOLHDL 2.2 06/03/2020  0918   LDLCALC 60 10/07/2021 0817   BMET    Component Value Date/Time   NA 137 07/11/2022 1700   NA 141 10/07/2021 0817   K 3.8 07/11/2022 1700   K 4.2 09/13/2013 1609   CL 101 07/11/2022 1700   CO2 27 07/11/2022 1700   GLUCOSE 119 (H) 07/11/2022 1700   BUN 19 07/11/2022 1700   BUN 16 10/07/2021 0817   CREATININE 1.32 (H) 07/11/2022 1700   CREATININE 1.03 06/25/2015 1012   CALCIUM 9.8 07/11/2022 1700   GFRNONAA 39 (L) 07/11/2022 1700   GFRAA 51 (L) 10/03/2020 1029   04/07/2022 Cholesterol 128, HDL 51, LDL 50, triglycerides 159 Hemoglobin 13.2, creatinine 1.13, potassium 4.3, ALT 10  10/07/2021 Hemoglobin A1c 6.1%  ASSESSMENT:    1. Chronic diastolic heart failure (HCC)   2. Atypical atrial flutter (HCC)   3. CHB (complete heart block) (HCC)   4. Pacemaker   5. Acquired thrombophilia (HCC)   6. Hypercholesterolemia   7. Pulmonic stenosis, congenital   8. Status post patch closure of ASD   9. Aneurysm of left pulmonary artery (HCC)   10. History of iron deficiency anemia      PLAN:  In order of problems listed above:  CHF, diastolic: Although she has the typical fatigue that occurs when she is in persistent atrial flutter, she still has NYHA functional class I and appears clinically euvolemic.  No change in diuretics recommended.  At her usual dry weight 155-156 pounds.  She has borderline reduced LVEF, likely related to RV apical pacing.   PAFlutter/ Afib: Recent onset about 5 days ago and she is not particular symptomatic yet.  This has frequently caused deterioration in clinical status, but she is not severely symptomatic today.  There have been no interruptions in her anticoagulants.  We were unsuccessful at overdrive pacing today.  However, last August after we interfered with the atrial flutter circuit she converted to normal rhythm spontaneously after a few days.  She has a remote download scheduled for December 11 and will make a decision for further treatment  based on that.  CHADSVasc4 (age 42, gender, HF). CHB: Pacemaker dependent.  No underlying escape rhythm and she feels very poorly when we performed ventricular threshold testing. PPM: Normal device function other than some atrial undersensing.  Atrial sensitivity was adjusted today.  Remote download next week. Anticoagulation: Dose adjusted for age and renal function.  Has not had any bleeding complications in about 3 years now. HLP: All lipid parameters are in the desirable range.  No known CAD or PAD. PS s/p surgical valvotomy: Her murmurs appear unchanged.  Mild to moderate regurgitation, no residual stenosis by echocardiogram.  No clinical signs of right heart failure today. ASD s/p patch closure: No evidence of residual shunt on multiple echoes. L PA aneurysm: 6.4 cm.  Plan conservative, non-surgical management.  No intention to pursue surgical repair.  Serial imaging not indicated. Anemia: She is off iron supplements for a while now and her radius hemoglobin is normal at 13.6.  No evidence of ongoing bleeding.  Shared Decision Making/Informed Consent The risks (stroke, cardiac arrhythmias rarely resulting in the need for a temporary or permanent pacemaker, skin irritation or burns and complications associated with conscious sedation including aspiration, arrhythmia, respiratory failure and death), benefits (restoration of normal sinus rhythm) and alternatives of a direct current cardioversion were explained in detail to Ms. Goza  and she agrees to proceed.      Medication Adjustments/Labs and Tests Ordered: Current medicines are reviewed at length with the patient today.  Concerns regarding medicines are outlined above.  Medication changes, Labs and Tests ordered today are listed in the Patient Instructions below. Patient Instructions  Medication Instructions:  Continue same medications *If you need a refill on your cardiac medications before your next appointment, please call your  pharmacy*   Lab Work: None ordered   Testing/Procedures: None ordered   Follow-Up: At [facility], you and your health needs are our priority.  As part of our continuing mission to provide you with exceptional heart care, we have created designated Provider Care Teams.  These Care Teams include your primary Cardiologist (physician) and Advanced Practice Providers (APPs -  Physician Assistants and Nurse Practitioners) who all work together to provide you with the care you need, when you need it.  We recommend signing up for the patient portal called "MyChart".  Sign up information is provided on this After Visit Summary.  MyChart is used to connect with patients for Virtual Visits (Telemedicine).  Patients are able to view lab/test results, encounter notes, upcoming appointments, etc.  Non-urgent messages can be sent to your provider as well.   To learn more about what you can do with MyChart, go to [email or web address].    Your next appointment:  3 to 4 months    The format for your next appointment: Office   Provider:  Dr.Minette Manders   Important Information About Sugar        Signed, Thurmon Fair, MD  11/28/2022 9:49 AM    [facility] [address] [place], [place], [place]  [address] Phone: [phone]; Fax: [phone]

## 2022-11-28 ENCOUNTER — Encounter: Payer: Self-pay | Admitting: Cardiovascular Disease

## 2022-11-30 ENCOUNTER — Ambulatory Visit (INDEPENDENT_AMBULATORY_CARE_PROVIDER_SITE_OTHER): Payer: Medicare Other

## 2022-11-30 DIAGNOSIS — I442 Atrioventricular block, complete: Secondary | ICD-10-CM

## 2022-12-01 ENCOUNTER — Other Ambulatory Visit: Payer: Self-pay | Admitting: Cardiovascular Disease

## 2022-12-01 ENCOUNTER — Telehealth: Payer: Self-pay | Admitting: Cardiovascular Disease

## 2022-12-01 ENCOUNTER — Telehealth: Payer: Self-pay

## 2022-12-01 DIAGNOSIS — Z01812 Encounter for preprocedural laboratory examination: Secondary | ICD-10-CM

## 2022-12-01 LAB — CUP PACEART REMOTE DEVICE CHECK
Battery Impedance: 1591 Ohm
Battery Remaining Longevity: 44 mo
Battery Voltage: 2.75 V
Date Time Interrogation Session: 20231211070933
Implantable Lead Connection Status: 753985
Implantable Lead Connection Status: 753985
Implantable Lead Implant Date: 20070920
Implantable Lead Implant Date: 20070920
Implantable Lead Location: 753859
Implantable Lead Location: 753860
Implantable Lead Model: 4592
Implantable Lead Model: 5092
Implantable Pulse Generator Implant Date: 20160707
Lead Channel Impedance Value: 511 Ohm
Lead Channel Impedance Value: 758 Ohm
Lead Channel Pacing Threshold Amplitude: 0.625 V
Lead Channel Pacing Threshold Amplitude: 1.375 V
Lead Channel Pacing Threshold Pulse Width: 0.4 ms
Lead Channel Pacing Threshold Pulse Width: 0.4 ms
Lead Channel Setting Pacing Amplitude: 2 V
Lead Channel Setting Pacing Amplitude: 2.75 V
Lead Channel Setting Pacing Pulse Width: 0.4 ms
Lead Channel Setting Sensing Sensitivity: 5.6 mV
Zone Setting Status: 755011
Zone Setting Status: 755011

## 2022-12-01 NOTE — Telephone Encounter (Signed)
LMOVM for patient to send me a manual transmission with her home remote monitor. Left device clinic number for her to call back.

## 2022-12-01 NOTE — Telephone Encounter (Signed)
Spoke with patient's daughter Lanice Schwab about recent device download, need for DCCV per Dr. Royann Shivers. Explained that patient will need to do a download on Jan 2 so MD can review rhythm prior to cardioversion. Explained that on Jan 4 she will need non-fasting labs before procedure AND and video visit (per MD) to update H&P for procedure that will be on Jan 5  Advised that all instructions will be sent via MyChart for reference.   Daughter voiced understanding.   Booking # [id number]

## 2022-12-01 NOTE — Telephone Encounter (Signed)
Pt daughter agreed to send the transmission for Dr. Salena Saner can review tonight.

## 2022-12-03 NOTE — Telephone Encounter (Signed)
Transmission received 12/01/2022

## 2022-12-24 ENCOUNTER — Ambulatory Visit: Payer: Medicare Other | Attending: Cardiology | Admitting: Cardiovascular Disease

## 2022-12-24 ENCOUNTER — Encounter: Payer: Self-pay | Admitting: Cardiovascular Disease

## 2022-12-24 VITALS — BP 122/81 | HR 85 | Ht 62.0 in | Wt 154.4 lb

## 2022-12-24 DIAGNOSIS — I4819 Other persistent atrial fibrillation: Secondary | ICD-10-CM

## 2022-12-24 DIAGNOSIS — I484 Atypical atrial flutter: Secondary | ICD-10-CM

## 2022-12-24 DIAGNOSIS — I5032 Chronic diastolic (congestive) heart failure: Secondary | ICD-10-CM

## 2022-12-24 LAB — BASIC METABOLIC PANEL
BUN/Creatinine Ratio: 17 (ref 12–28)
BUN: 22 mg/dL (ref 8–27)
CO2: 28 mmol/L (ref 20–29)
Calcium: 9.7 mg/dL (ref 8.7–10.3)
Chloride: 102 mmol/L (ref 96–106)
Creatinine, Ser: 1.31 mg/dL — ABNORMAL HIGH (ref 0.57–1.00)
Glucose: 134 mg/dL — ABNORMAL HIGH (ref 70–99)
Potassium: 4.3 mmol/L (ref 3.5–5.2)
Sodium: 139 mmol/L (ref 134–144)
eGFR: 39 mL/min/{1.73_m2} — ABNORMAL LOW (ref >59)

## 2022-12-24 LAB — CBC
Hematocrit: 41.2 % (ref 34.0–46.6)
Hemoglobin: 13.5 g/dL (ref 11.1–15.9)
MCH: 30.8 pg (ref 26.6–33.0)
MCHC: 32.8 g/dL (ref 31.5–35.7)
MCV: 94 fL (ref 79–97)
Platelets: 198 10*3/uL (ref 150–450)
RBC: 4.38 x10E6/uL (ref 3.77–5.28)
RDW: 13.9 % (ref 11.7–15.4)
WBC: 5.3 10*3/uL (ref 3.4–10.8)

## 2022-12-24 NOTE — Progress Notes (Signed)
Patient ID: [name] Baird, female   DOB: 1933-10-12, 89 y.o.   MRN: [id number]     Cardiology Office Note    Date:  12/24/2022   ID:  [name] Baird, DOB 1933-07-01, MRN [id number]  PCP:  Irven Coe, MD  Cardiologist:   Thurmon Fair, MD   Chief Complaint  Patient presents with   Atrial Fibrillation     Virtual Visit via Video Note   Because of [name]'s co-morbid illnesses, she is at least at moderate risk for complications without adequate follow up.  This format is felt to be most appropriate for this patient at this time.  All issues noted in this document were discussed and addressed.  A limited physical exam was performed with this format.  Please refer to the patient's chart for her consent to telehealth for [facility].       Date:  12/24/2022   ID:  [name] Baird, DOB [birth year], MRN [id number] The patient was identified using 2 identifiers.  Patient Location: Home Provider Location: Office/Clinic    Risk Assessment/Calculations:    CHA2DS2-VASc Score = 5   This indicates a 7.2% annual risk of stroke. The patient's score is based upon: CHF History: 1 HTN History: 1 Diabetes History: 0 Stroke History: 0 Vascular Disease History: 0 Age Score: 2 Gender Score: 1         Objective:    Vital Signs:  BP 122/81 (BP Location: Left Arm, Patient Position: Sitting)   Pulse 85   Ht 5\' 2"  (1.575 m)   Wt 154 lb 6.4 oz (70 kg)   BMI 28.24 kg/m    VITAL SIGNS:  reviewed GEN:  no acute distress EYES:  sclerae anicteric, EOMI - Extraocular Movements Intact RESPIRATORY:  normal respiratory effort, symmetric expansion CARDIOVASCULAR:  no peripheral edema SKIN:  no rash, lesions or ulcers. MUSCULOSKELETAL:  no obvious deformities. NEURO:  alert and oriented x 3, no obvious focal deficit PSYCH:  normal affect       Shared Decision Making/Informed Consent The risks (stroke, cardiac arrhythmias rarely resulting in the need for  a temporary or permanent pacemaker, skin irritation or burns and complications associated with conscious sedation including aspiration, arrhythmia, respiratory failure and death), benefits (restoration of normal sinus rhythm) and alternatives of a direct current cardioversion were explained in detail to Ms. Nicole and she agrees to proceed.       Time:   Today, I have spent 22 minutes with the patient with telehealth technology discussing the above problems.     History of Present Illness:  [name] Baird is a 89 y.o. female with a history of congenital heart disease (atrial septal defect status post patch closure), pulmonic stenosis (status post valvotomy 1985), left pulmonary artery aneurysm, complete heart block status post dual-chamber permanent pacemaker implantation (Medtronic, last generator change 2014) and infrequent paroxysmal atrial fibrillation (atrial flutter cavotricuspid isthmus ablation 2014). She has history of diastolic heart failure with infrequent exacerbation, most recently following an episode of lower GI bleeding with severe anemia in March 2021.  Xarelto was switched to Eliquis after that episode of bleeding.  Atypical atrial flutter continues to be a recurrent problem.  She had successful overdrive pacing of the arrhythmia in April 2023 with improvement in her symptoms of fatigue and reduced stamina.  Will try to do this again for a lengthy episode of atrial flutter in August, unsuccessfully.  However, when she presented for cardioversion on September 27 she was back in  normal rhythm spontaneously.  Pacemaker interrogation today again shows that she was in atrial flutter for the last approximately 5 days.  The cycle length is about 330 ms, very similar to the previous episodes in April and August.  We tried overdrive pace in the office again today.  This accelerated the flutter to a different cycle length of about 260 ms and eventually cause deterioration to atrial  fibrillation.  She remains quite sedentary, but denies orthopnea, PND, lower extremity edema or chest pain.  Has not required recent adjustments in her diuretic.  On her home scale her weight remains stable in the 155-156 pounds range, a couple of pounds higher in the office.  She has not had syncope.  She has a lot of pain in the right shoulder and is seeing an orthopedic specialist in [place].  Has not had any falls injuries or bleeding problems.  Compliant with anticoagulation with Xarelto.  She has rarely required any adjustments in her diuretic dose (usually takes furosemide 80 mg 3 days a week and 40 mg 4 days a week).  Her weight has drifted down slightly over the last few years.  Currently appears to be stable at about 155-156 pounds.  At her last appointment she presented in persistent atrial flutter which has been present for couple of weeks.  Overdrive pacing was unsuccessful.  Remote pacemaker download done just 2 days ago shows that she remains in persistent atrial flutter.  She is scheduled for cardioversion tomorrow.  In the past, persistent atrial arrhythmia has led to heart failure decompensation, but this time she seems to be tolerating it better without major change in symptoms and without the need for adjustment of her diuretic dose.  Otherwise pacemaker function is normal.  When not in atrial flutter she has only 70% atrial pacing with good heart rate histogram distribution.  She is pacemaker dependent with 100% ventricular pacing.  There is occasional under sensing/blanking of the flutter waves.  Her pacemaker leads are the original ones from 2007 but still have excellent parameters.    In May 2019 she saw Dr. Lowanda Foster Zwischenberger at [facility] to discuss treatment for pulmonary artery aneurysm.  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  Conservative management was recommended.  Echo performed there showed a left  ventricular ejection fraction of 45-50%, severe left atrial enlargement, moderate regurgitation of all 4 cardiac valves, in addition to the dilated pulmonary artery and left pulmonary artery aneurysm.  In April 2017 she had one episode that lasted for 6 hours.  In March 2018 the atrial fibrillation only lasted for just over 2 minutes.  She had a 7 beat run of nonsustained ventricular tachycardia in March 2020.  She had atrial flutter ablation performed by Dr. Johney Frame in July 2014 and is not taking any antiarrhythmics at this time, other than a low dose of beta blocker. At the time of the EP study she was noted to have conventional counterclockwise isthmus-dependent right atrial flutter but then a second type of right atrial flutter was noted briefly. It could not be reinduced for full study.  She has done poorly on conventional beta-blockers (atenolol, metoprolol), but tolerates Bystolic well.   Past Medical History:  Diagnosis Date   Acute GI bleeding 03/06/2020   Anxiety    Atrial flutter (HCC)    s/p ablation 06/29/2013 by Dr Johney Frame   CHF (congestive heart failure) (HCC)    Complete  heart block (HCC) 2007   s/p medtronic pacemaker implantation   Depression    Glaucoma    Hypertension    Pulmonary stenosis sp valvotomy        Shortness of breath    Status post patch closure of ASD     Past Surgical History:  Procedure Laterality Date   ABLATION  06/29/2013   s/p isthmus ablation for atrial flutter by Dr Johney FrameAllred   AORTIC VALVE REPAIR     ATRIAL FLUTTER ABLATION N/A 06/29/2013   Procedure: ATRIAL FLUTTER ABLATION;  Surgeon: Hillis RangeJames Allred, MD;  [facility];  Service: Cardiovascular;  Laterality: N/A;   CARDIAC CATHETERIZATION N/A 06/27/2015   Procedure: Temporary Pacemaker;  Surgeon: Thurmon FairMihai Eyana Stolze, MD;  Location: [facility] INVASIVE CV LAB;  Service: Cardiovascular;  Laterality: N/A;   CARDIOVERSION N/A 03/24/2013   Procedure: CARDIOVERSION;  Surgeon: Thurmon FairMihai Louana Fontenot, MD;  Location: [facility]  ENDOSCOPY;  Service: Cardiovascular;  Laterality: N/A;   COLONOSCOPY WITH PROPOFOL Left 03/08/2020   Procedure: COLONOSCOPY WITH PROPOFOL;  Surgeon: Willis Modenautlaw, William, MD;  [facility];  Service: Endoscopy;  Laterality: Left;   EP IMPLANTABLE DEVICE N/A 06/27/2015   Procedure: PPM Generator Changeout;  Surgeon: Thurmon FairMihai Shantinique Picazo, MD;  Location: [facility] INVASIVE CV LAB;  Service: Cardiovascular;  Laterality: N/A;   ESOPHAGOGASTRODUODENOSCOPY (EGD) WITH PROPOFOL Left 03/07/2020   Procedure: ESOPHAGOGASTRODUODENOSCOPY (EGD) WITH PROPOFOL;  Surgeon: Willis Modenautlaw, William, MD;  [facility];  Service: Endoscopy;  Laterality: Left;   PACEMAKER INSERTION  2007   Medtronic Adapta ADDRL1, serial # [id number]   pulmonary valvotomy  1985   trigeminal sx     left side    Outpatient Medications Prior to Visit  Medication Sig Dispense Refill   acetaminophen (TYLENOL) 650 MG CR tablet Take 650 mg by mouth in the morning and at bedtime.     Calcium Carb-Cholecalciferol (CALCIUM 600/VITAMIN D3 PO) Take 1 tablet by mouth daily.     candesartan (ATACAND) 8 MG tablet TAKE 1 TABLET BY MOUTH EVERY DAY 90 tablet 3   cholecalciferol (VITAMIN D3) 25 MCG (1000 UNIT) tablet Take 1,000 Units by mouth every other day.     clonazePAM (KLONOPIN) 0.5 MG tablet Take half tablet every other day as needed, for anxiety (Patient taking differently: Take 0.25 mg by mouth at bedtime. Take 1/4 tablet every day as needed, for anxiety) 15 tablet 0   dorzolamide-timolol (COSOPT) 22.3-6.8 MG/ML ophthalmic solution Place 1 drop into both eyes 2 (two) times daily.     erythromycin ophthalmic ointment Place 1 Application into the left eye in the morning and at bedtime.     famotidine (PEPCID) 20 MG tablet Take 20 mg by mouth daily.     furosemide (LASIX) 40 MG tablet Take 80mg  by mouth on Monday, Wednesday and Friday Take 40mg  by mouth on Tuesday, Thursday Saturday and Sunday (Patient taking differently: Take 40-80 mg by mouth See admin  instructions. Take 80mg  by mouth on Monday, Wednesday and Friday Take 40mg  by mouth on Tuesday, Thursday Saturday and Sunday)     latanoprost (XALATAN) 0.005 % ophthalmic solution Place 1 drop into both eyes at bedtime.     Multiple Vitamins-Minerals (MULTIVITAMIN PO) Take 1 tablet by mouth daily.      nebivolol (BYSTOLIC) 10 MG tablet TAKE 1 TABLET BY MOUTH EVERY DAY 90 tablet 3   nystatin (MYCOSTATIN/NYSTOP) powder Apply 1 Application topically daily as needed (irritation).     polyvinyl alcohol (ARTIFICIAL TEARS) 1.4 % ophthalmic solution Place 1 drop into  both eyes as needed for dry eyes.     potassium chloride (KLOR-CON M10) 10 MEQ tablet TAKE 2 TABLETS BY MOUTH TWICE A DAY (Patient taking differently: Take 10-20 mEq by mouth See admin instructions. Take 20 meq by mouth on Monday, Wednesday and Friday Take 10 meq by mouth on Tuesday, Thursday Saturday and Sunday) 360 tablet 3   RHOPRESSA 0.02 % SOLN Place 1 drop into the right eye every other day.     rosuvastatin (CRESTOR) 10 MG tablet TAKE 1 TABLET BY MOUTH EVERYDAY AT BEDTIME 90 tablet 1   UNABLE TO FIND Med Name: Heel protector boot for right ankle Apply at bedtime to promote healing of your pressure ulcer. 1 each 0   venlafaxine XR (EFFEXOR-XR) 75 MG 24 hr capsule TAKE 1 CAPSULE BY MOUTH EVERY DAY (Patient taking differently: Take 75 mg by mouth at bedtime. TAKE 1 CAPSULE BY MOUTH EVERY DAY) 90 capsule 1   White Petrolatum-Mineral Oil (GENTEAL TEARS NIGHT-TIME) OINT Place 1 application  into the right eye at bedtime.     XARELTO 15 MG TABS tablet TAKE 1 TABLET (15 MG TOTAL) BY MOUTH DAILY WITH SUPPER 90 tablet 1   traMADol (ULTRAM) 50 MG tablet Take 50 mg by mouth daily as needed for severe pain. (Patient not taking: Reported on 12/24/2022)     No facility-administered medications prior to visit.     Allergies:   Sulfa antibiotics and Hydrocodone   Social History   Socioeconomic History   Marital status: Widowed    Spouse name: Not  on file   Number of children: 2   Years of education: Not on file   Highest education level: Not on file  Occupational History   Occupation: retired/homemaker  Tobacco Use   Smoking status: Never   Smokeless tobacco: Never  Vaping Use   Vaping Use: Never used  Substance and Sexual Activity   Alcohol use: No   Drug use: No   Sexual activity: Never  Other Topics Concern   Not on file  Social History Narrative   Not on file   Social Determinants of Health   Financial Resource Strain: Low Risk  (05/12/2021)   Overall Financial Resource Strain (CARDIA)    Difficulty of Paying Living Expenses: Not hard at all  Food Insecurity: No Food Insecurity (05/12/2021)   Hunger Vital Sign    Worried About Running Out of Food in the Last Year: Never true    [place] in the Last Year: Never true  Transportation Needs: No Transportation Needs (05/12/2021)   PRAPARE - Hydrologist (Medical): No    Lack of Transportation (Non-Medical): No  Physical Activity: Insufficiently Active (05/12/2021)   Exercise Vital Sign    Days of Exercise per Week: 3 days    Minutes of Exercise per Session: 30 min  Stress: No Stress Concern Present (05/12/2021)   [place]    Feeling of Stress : Not at all  Social Connections: Moderately Isolated (05/12/2021)   Social Connection and Isolation Panel [NHANES]    Frequency of Communication with Friends and Family: More than three times a week    Frequency of Social Gatherings with Friends and Family: More than three times a week    Attends Religious Services: More than 4 times per year    Active Member of [employer] or Organizations: No    Attends Archivist Meetings: Never  Marital Status: Widowed    ROS:   Please see the history of present illness.    All other systems are reviewed and are negative.    PHYSICAL EXAM:   VS:  BP 122/81 (BP Location: Left  Arm, Patient Position: Sitting)   Pulse 85   Ht 5\' 2"  (1.575 m)   Wt 154 lb 6.4 oz (70 kg)   BMI 28.24 kg/m      Wt Readings from Last 3 Encounters:  12/24/22 154 lb 6.4 oz (70 kg)  11/23/22 158 lb 9.6 oz (71.9 kg)  09/07/22 155 lb (70.3 kg)    Studies/Labs Reviewed:   ECHO 10/24/2020:  1. Left ventricular ejection fraction, by estimation, is 45 to 50%. The  left ventricle has mildly decreased function. The left ventricle  demonstrates regional wall motion abnormalities with septal-lateral  dyssynchrony consistent with RV pacing. Left  ventricular diastolic parameters are consistent with Grade I diastolic  dysfunction (impaired relaxation).   2. Right ventricular systolic function is mildly reduced. The right  ventricular size is mildly enlarged. There is mildly elevated pulmonary  artery systolic pressure. The estimated right ventricular systolic  pressure is 37.1 mmHg.   3. Left atrial size was mildly dilated.   4. Right atrial size was mildly dilated.   5. Tricuspid valve regurgitation is moderate.   6. The aortic valve is tricuspid. Aortic valve regurgitation is mild.  Mild aortic valve sclerosis is present, with no evidence of aortic valve  stenosis.   7. Thickened and doming pulmonary valve. There does not appear to be  significant stenosis. There is mild to moderate pulmonic insufficiency.  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   8. Aortic dilatation noted. There is mild dilatation of the ascending  aorta, measuring 40 mm.   9. The inferior vena cava is normal in size with greater than 50%  respiratory variability, suggesting right atrial pressure of 3 mmHg.  10. The mitral valve is normal in structure. Trivial mitral valve  regurgitation.   EKG:  EKG is ordered at her last appt and shows background atrial flutter, occasional atrial under sensing and subsequent atrial pacing, 100% ventricular paced rhythm.  The paced QRS is  broad 154 ms but has a positive R  wave in lead V1.   Lipid Panel     Component Value Date/Time   CHOL 138 10/07/2021 0817   TRIG 144 10/07/2021 0817   HDL 53 10/07/2021 0817   CHOLHDL 2.2 06/03/2020 0918   LDLCALC 60 10/07/2021 0817   BMET    Component Value Date/Time   NA 137 07/11/2022 1700   NA 141 10/07/2021 0817   K 3.8 07/11/2022 1700   K 4.2 09/13/2013 1609   CL 101 07/11/2022 1700   CO2 27 07/11/2022 1700   GLUCOSE 119 (H) 07/11/2022 1700   BUN 19 07/11/2022 1700   BUN 16 10/07/2021 0817   CREATININE 1.32 (H) 07/11/2022 1700   CREATININE 1.03 06/25/2015 1012   CALCIUM 9.8 07/11/2022 1700   GFRNONAA 39 (L) 07/11/2022 1700   GFRAA 51 (L) 10/03/2020 1029   04/07/2022 Cholesterol 128, HDL 51, LDL 50, triglycerides 159 Hemoglobin 13.2, creatinine 1.13, potassium 4.3, ALT 10  10/07/2021 Hemoglobin A1c 6.1%  ASSESSMENT:    No diagnosis found.    PLAN:  In order of problems listed above:  CHF, diastolic: Although she has the typical fatigue that occurs when she is in persistent atrial flutter, she still has NYHA functional class I  and appears clinically euvolemic.  No change in diuretics recommended.  At her usual dry weight 155-156 pounds.  She has borderline reduced LVEF, likely related to RV apical pacing.   PAFlutter/ Afib: Persistent for the last month.  In the past has associated heart failure exacerbation.  Seems to be tolerating better today, but will go ahead with scheduled cardioversion tomorrow.  CHB: Pacemaker dependent.  No underlying escape rhythm and she feels very poorly when we performed ventricular threshold testing. PPM: Normal device function other than some atrial undersensing.  Atrial sensitivity was adjusted today.  Remote download next week. Anticoagulation: Dose adjusted for age and renal function.  Has not had any bleeding complications in about 3 years now. HLP: All lipid parameters are in the desirable range.  No known CAD or PAD. PS s/p surgical valvotomy: Her murmurs  appear unchanged.  Mild to moderate regurgitation, no residual stenosis by echocardiogram.  No clinical signs of right heart failure today. ASD s/p patch closure: No evidence of residual shunt on multiple echoes. L PA aneurysm: 6.4 cm.  Plan conservative, non-surgical management.  No intention to pursue surgical repair.  Serial imaging not indicated. Anemia: She is off iron supplements for a while now and her radius hemoglobin is normal at 13.6.  No evidence of ongoing bleeding.  Shared Decision Making/Informed Consent The risks (stroke, cardiac arrhythmias rarely resulting in the need for a temporary or permanent pacemaker, skin irritation or burns and complications associated with conscious sedation including aspiration, arrhythmia, respiratory failure and death), benefits (restoration of normal sinus rhythm) and alternatives of a direct current cardioversion were explained in detail to Ms. Marcus and she agrees to proceed.      Medication Adjustments/Labs and Tests Ordered: Current medicines are reviewed at length with the patient today.  Concerns regarding medicines are outlined above.  Medication changes, Labs and Tests ordered today are listed in the Patient Instructions below. Patient Instructions  Medication Instructions:  NO CHANGES  *If you need a refill on your cardiac medications before your next appointment, please call your pharmacy*   Lab Work: BMET and CBC at Dr. C's office today   If you have labs (blood work) drawn today and your tests are completely normal, you will receive your results only by: MyChart Message (if you have MyChart) OR A paper copy in the mail If you have any lab test that is abnormal or we need to change your treatment, we will call you to review the results.   Testing/Procedures: Cardioversion as scheduled 12/25/22   Follow-Up: At [facility], you and your health needs are our priority.  As part of our continuing mission to provide you  with exceptional heart care, we have created designated Provider Care Teams.  These Care Teams include your primary Cardiologist (physician) and Advanced Practice Providers (APPs -  Physician Assistants and Nurse Practitioners) who all work together to provide you with the care you need, when you need it.  We recommend signing up for the patient portal called "MyChart".  Sign up information is provided on this After Visit Summary.  MyChart is used to connect with patients for Virtual Visits (Telemedicine).  Patients are able to view lab/test results, encounter notes, upcoming appointments, etc.  Non-urgent messages can be sent to your provider as well.   To learn more about what you can do with MyChart, go to [email or web address].    Your next appointment:    AS SCHEDULED in March with Dr. Tersa Fotopoulos         Signed, Sanda Klein, MD  12/24/2022 8:14 AM    [facility] [place], [place], [place]  [address] Phone: [phone]; Fax: [phone]

## 2022-12-24 NOTE — H&P (View-Only) (Signed)
Patient ID: [name] Baird, female   DOB: 1933-10-12, 89 y.o.   MRN: [id number]     Cardiology Office Note    Date:  12/24/2022   ID:  [name] Baird, DOB 1933-07-01, MRN [id number]  PCP:  Irven Coe, MD  Cardiologist:   Thurmon Fair, MD   Chief Complaint  Patient presents with   Atrial Fibrillation     Virtual Visit via Video Note   Because of [name] Baird's co-morbid illnesses, she is at least at moderate risk for complications without adequate follow up.  This format is felt to be most appropriate for this patient at this time.  All issues noted in this document were discussed and addressed.  A limited physical exam was performed with this format.  Please refer to the patient's chart for her consent to telehealth for [facility].       Date:  12/24/2022   ID:  [name] Baird, DOB [birth year], MRN [id number] The patient was identified using 2 identifiers.  Patient Location: Home Provider Location: Office/Clinic    Risk Assessment/Calculations:    CHA2DS2-VASc Score = 5   This indicates a 7.2% annual risk of stroke. The patient's score is based upon: CHF History: 1 HTN History: 1 Diabetes History: 0 Stroke History: 0 Vascular Disease History: 0 Age Score: 2 Gender Score: 1         Objective:    Vital Signs:  BP 122/81 (BP Location: Left Arm, Patient Position: Sitting)   Pulse 85   Ht 5\' 2"  (1.575 m)   Wt 154 lb 6.4 oz (70 kg)   BMI 28.24 kg/m    VITAL SIGNS:  reviewed GEN:  no acute distress EYES:  sclerae anicteric, EOMI - Extraocular Movements Intact RESPIRATORY:  normal respiratory effort, symmetric expansion CARDIOVASCULAR:  no peripheral edema SKIN:  no rash, lesions or ulcers. MUSCULOSKELETAL:  no obvious deformities. NEURO:  alert and oriented x 3, no obvious focal deficit PSYCH:  normal affect       Shared Decision Making/Informed Consent The risks (stroke, cardiac arrhythmias rarely resulting in the need for  a temporary or permanent pacemaker, skin irritation or burns and complications associated with conscious sedation including aspiration, arrhythmia, respiratory failure and death), benefits (restoration of normal sinus rhythm) and alternatives of a direct current cardioversion were explained in detail to [name] Baird and she agrees to proceed.       Time:   Today, I have spent 22 minutes with the patient with telehealth technology discussing the above problems.     History of Present Illness:  [name] Baird is a 89 y.o. female with a history of congenital heart disease (atrial septal defect status post patch closure), pulmonic stenosis (status post valvotomy 1985), left pulmonary artery aneurysm, complete heart block status post dual-chamber permanent pacemaker implantation (Medtronic, last generator change 2014) and infrequent paroxysmal atrial fibrillation (atrial flutter cavotricuspid isthmus ablation 2014). She has history of diastolic heart failure with infrequent exacerbation, most recently following an episode of lower GI bleeding with severe anemia in March 2021.  Xarelto was switched to Eliquis after that episode of bleeding.  Atypical atrial flutter continues to be a recurrent problem.  She had successful overdrive pacing of the arrhythmia in April 2023 with improvement in her symptoms of fatigue and reduced stamina.  Will try to do this again for a lengthy episode of atrial flutter in August, unsuccessfully.  However, when she presented for cardioversion on September 27 she was back in  normal rhythm spontaneously.  Pacemaker interrogation today again shows that she was in atrial flutter for the last approximately 5 days.  The cycle length is about 330 ms, very similar to the previous episodes in April and August.  We tried overdrive pace in the office again today.  This accelerated the flutter to a different cycle length of about 260 ms and eventually cause deterioration to atrial  fibrillation.  She remains quite sedentary, but denies orthopnea, PND, lower extremity edema or chest pain.  Has not required recent adjustments in her diuretic.  On her home scale her weight remains stable in the 155-156 pounds range, a couple of pounds higher in the office.  She has not had syncope.  She has a lot of pain in the right shoulder and is seeing an orthopedic specialist in [place].  Has not had any falls injuries or bleeding problems.  Compliant with anticoagulation with Xarelto.  She has rarely required any adjustments in her diuretic dose (usually takes furosemide 80 mg 3 days a week and 40 mg 4 days a week).  Her weight has drifted down slightly over the last few years.  Currently appears to be stable at about 155-156 pounds.  At her last appointment she presented in persistent atrial flutter which has been present for couple of weeks.  Overdrive pacing was unsuccessful.  Remote pacemaker download done just 2 days ago shows that she remains in persistent atrial flutter.  She is scheduled for cardioversion tomorrow.  In the past, persistent atrial arrhythmia has led to heart failure decompensation, but this time she seems to be tolerating it better without major change in symptoms and without the need for adjustment of her diuretic dose.  Otherwise pacemaker function is normal.  When not in atrial flutter she has only 70% atrial pacing with good heart rate histogram distribution.  She is pacemaker dependent with 100% ventricular pacing.  There is occasional under sensing/blanking of the flutter waves.  Her pacemaker leads are the original ones from 2007 but still have excellent parameters.    In May 2019 she saw Dr. Lowanda Foster Zwischenberger at [facility] to discuss treatment for pulmonary artery aneurysm.  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  Conservative management was recommended.  Echo performed there showed a left  ventricular ejection fraction of 45-50%, severe left atrial enlargement, moderate regurgitation of all 4 cardiac valves, in addition to the dilated pulmonary artery and left pulmonary artery aneurysm.  In April 2017 she had one episode that lasted for 6 hours.  In March 2018 the atrial fibrillation only lasted for just over 2 minutes.  She had a 7 beat run of nonsustained ventricular tachycardia in March 2020.  She had atrial flutter ablation performed by Dr. Johney Frame in July 2014 and is not taking any antiarrhythmics at this time, other than a low dose of beta blocker. At the time of the EP study she was noted to have conventional counterclockwise isthmus-dependent right atrial flutter but then a second type of right atrial flutter was noted briefly. It could not be reinduced for full study.  She has done poorly on conventional beta-blockers (atenolol, metoprolol), but tolerates Bystolic well.   Past Medical History:  Diagnosis Date   Acute GI bleeding 03/06/2020   Anxiety    Atrial flutter (HCC)    s/p ablation 06/29/2013 by Dr Johney Frame   CHF (congestive heart failure) (HCC)    Complete  heart block (HCC) 2007   s/p medtronic pacemaker implantation   Depression    Glaucoma    Hypertension    Pulmonary stenosis sp valvotomy        Shortness of breath    Status post patch closure of ASD     Past Surgical History:  Procedure Laterality Date   ABLATION  06/29/2013   s/p isthmus ablation for atrial flutter by Dr Johney FrameAllred   AORTIC VALVE REPAIR     ATRIAL FLUTTER ABLATION N/A 06/29/2013   Procedure: ATRIAL FLUTTER ABLATION;  Surgeon: Hillis RangeJames Allred, MD;  [facility];  Service: Cardiovascular;  Laterality: N/A;   CARDIAC CATHETERIZATION N/A 06/27/2015   Procedure: Temporary Pacemaker;  Surgeon: Thurmon FairMihai Amal Saiki, MD;  Location: [facility] INVASIVE CV LAB;  Service: Cardiovascular;  Laterality: N/A;   CARDIOVERSION N/A 03/24/2013   Procedure: CARDIOVERSION;  Surgeon: Thurmon FairMihai Davyn Morandi, MD;  Location: [facility]  ENDOSCOPY;  Service: Cardiovascular;  Laterality: N/A;   COLONOSCOPY WITH PROPOFOL Left 03/08/2020   Procedure: COLONOSCOPY WITH PROPOFOL;  Surgeon: Willis Modenautlaw, William, MD;  [facility];  Service: Endoscopy;  Laterality: Left;   EP IMPLANTABLE DEVICE N/A 06/27/2015   Procedure: PPM Generator Changeout;  Surgeon: Thurmon FairMihai Meshulem Onorato, MD;  Location: [facility] INVASIVE CV LAB;  Service: Cardiovascular;  Laterality: N/A;   ESOPHAGOGASTRODUODENOSCOPY (EGD) WITH PROPOFOL Left 03/07/2020   Procedure: ESOPHAGOGASTRODUODENOSCOPY (EGD) WITH PROPOFOL;  Surgeon: Willis Modenautlaw, William, MD;  [facility];  Service: Endoscopy;  Laterality: Left;   PACEMAKER INSERTION  2007   Medtronic Adapta ADDRL1, serial # [id number]   pulmonary valvotomy  1985   trigeminal sx     left side    Outpatient Medications Prior to Visit  Medication Sig Dispense Refill   acetaminophen (TYLENOL) 650 MG CR tablet Take 650 mg by mouth in the morning and at bedtime.     Calcium Carb-Cholecalciferol (CALCIUM 600/VITAMIN D3 PO) Take 1 tablet by mouth daily.     candesartan (ATACAND) 8 MG tablet TAKE 1 TABLET BY MOUTH EVERY DAY 90 tablet 3   cholecalciferol (VITAMIN D3) 25 MCG (1000 UNIT) tablet Take 1,000 Units by mouth every other day.     clonazePAM (KLONOPIN) 0.5 MG tablet Take half tablet every other day as needed, for anxiety (Patient taking differently: Take 0.25 mg by mouth at bedtime. Take 1/4 tablet every day as needed, for anxiety) 15 tablet 0   dorzolamide-timolol (COSOPT) 22.3-6.8 MG/ML ophthalmic solution Place 1 drop into both eyes 2 (two) times daily.     erythromycin ophthalmic ointment Place 1 Application into the left eye in the morning and at bedtime.     famotidine (PEPCID) 20 MG tablet Take 20 mg by mouth daily.     furosemide (LASIX) 40 MG tablet Take 80mg  by mouth on Monday, Wednesday and Friday Take 40mg  by mouth on Tuesday, Thursday Saturday and Sunday (Patient taking differently: Take 40-80 mg by mouth See admin  instructions. Take 80mg  by mouth on Monday, Wednesday and Friday Take 40mg  by mouth on Tuesday, Thursday Saturday and Sunday)     latanoprost (XALATAN) 0.005 % ophthalmic solution Place 1 drop into both eyes at bedtime.     Multiple Vitamins-Minerals (MULTIVITAMIN PO) Take 1 tablet by mouth daily.      nebivolol (BYSTOLIC) 10 MG tablet TAKE 1 TABLET BY MOUTH EVERY DAY 90 tablet 3   nystatin (MYCOSTATIN/NYSTOP) powder Apply 1 Application topically daily as needed (irritation).     polyvinyl alcohol (ARTIFICIAL TEARS) 1.4 % ophthalmic solution Place 1 drop into  both eyes as needed for dry eyes.     potassium chloride (KLOR-CON M10) 10 MEQ tablet TAKE 2 TABLETS BY MOUTH TWICE A DAY (Patient taking differently: Take 10-20 mEq by mouth See admin instructions. Take 20 meq by mouth on Monday, Wednesday and Friday Take 10 meq by mouth on Tuesday, Thursday Saturday and Sunday) 360 tablet 3   RHOPRESSA 0.02 % SOLN Place 1 drop into the right eye every other day.     rosuvastatin (CRESTOR) 10 MG tablet TAKE 1 TABLET BY MOUTH EVERYDAY AT BEDTIME 90 tablet 1   UNABLE TO FIND Med Name: Heel protector boot for right ankle Apply at bedtime to promote healing of your pressure ulcer. 1 each 0   venlafaxine XR (EFFEXOR-XR) 75 MG 24 hr capsule TAKE 1 CAPSULE BY MOUTH EVERY DAY (Patient taking differently: Take 75 mg by mouth at bedtime. TAKE 1 CAPSULE BY MOUTH EVERY DAY) 90 capsule 1   White Petrolatum-Mineral Oil (GENTEAL TEARS NIGHT-TIME) OINT Place 1 application  into the right eye at bedtime.     XARELTO 15 MG TABS tablet TAKE 1 TABLET (15 MG TOTAL) BY MOUTH DAILY WITH SUPPER 90 tablet 1   traMADol (ULTRAM) 50 MG tablet Take 50 mg by mouth daily as needed for severe pain. (Patient not taking: Reported on 12/24/2022)     No facility-administered medications prior to visit.     Allergies:   Sulfa antibiotics and Hydrocodone   Social History   Socioeconomic History   Marital status: Widowed    Spouse name: Not  on file   Number of children: 2   Years of education: Not on file   Highest education level: Not on file  Occupational History   Occupation: retired/homemaker  Tobacco Use   Smoking status: Never   Smokeless tobacco: Never  Vaping Use   Vaping Use: Never used  Substance and Sexual Activity   Alcohol use: No   Drug use: No   Sexual activity: Never  Other Topics Concern   Not on file  Social History Narrative   Not on file   Social Determinants of Health   Financial Resource Strain: Low Risk  (05/12/2021)   Overall Financial Resource Strain (CARDIA)    Difficulty of Paying Living Expenses: Not hard at all  Food Insecurity: No Food Insecurity (05/12/2021)   Hunger Vital Sign    Worried About Running Out of Food in the Last Year: Never true    [place]t [place] in the Last Year: Never true  Transportation Needs: No Transportation Needs (05/12/2021)   PRAPARE - Hydrologist (Medical): No    Lack of Transportation (Non-Medical): No  Physical Activity: Insufficiently Active (05/12/2021)   Exercise Vital Sign    Days of Exercise per Week: 3 days    Minutes of Exercise per Session: 30 min  Stress: No Stress Concern Present (05/12/2021)   [place]    Feeling of Stress : Not at all  Social Connections: Moderately Isolated (05/12/2021)   Social Connection and Isolation Panel [NHANES]    Frequency of Communication with Friends and Family: More than three times a week    Frequency of Social Gatherings with Friends and Family: More than three times a week    Attends Religious Services: More than 4 times per year    Active Member of [employer] or Organizations: No    Attends Archivist Meetings: Never  Marital Status: Widowed    ROS:   Please see the history of present illness.    All other systems are reviewed and are negative.    PHYSICAL EXAM:   VS:  BP 122/81 (BP Location: Left  Arm, Patient Position: Sitting)   Pulse 85   Ht 5\' 2"  (1.575 m)   Wt 154 lb 6.4 oz (70 kg)   BMI 28.24 kg/m      Wt Readings from Last 3 Encounters:  12/24/22 154 lb 6.4 oz (70 kg)  11/23/22 158 lb 9.6 oz (71.9 kg)  09/07/22 155 lb (70.3 kg)    Studies/Labs Reviewed:   ECHO 10/24/2020:  1. Left ventricular ejection fraction, by estimation, is 45 to 50%. The  left ventricle has mildly decreased function. The left ventricle  demonstrates regional wall motion abnormalities with septal-lateral  dyssynchrony consistent with RV pacing. Left  ventricular diastolic parameters are consistent with Grade I diastolic  dysfunction (impaired relaxation).   2. Right ventricular systolic function is mildly reduced. The right  ventricular size is mildly enlarged. There is mildly elevated pulmonary  artery systolic pressure. The estimated right ventricular systolic  pressure is 37.1 mmHg.   3. Left atrial size was mildly dilated.   4. Right atrial size was mildly dilated.   5. Tricuspid valve regurgitation is moderate.   6. The aortic valve is tricuspid. Aortic valve regurgitation is mild.  Mild aortic valve sclerosis is present, with no evidence of aortic valve  stenosis.   7. Thickened and doming pulmonary valve. There does not appear to be  significant stenosis. There is mild to moderate pulmonic insufficiency.  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   8. Aortic dilatation noted. There is mild dilatation of the ascending  aorta, measuring 40 mm.   9. The inferior vena cava is normal in size with greater than 50%  respiratory variability, suggesting right atrial pressure of 3 mmHg.  10. The mitral valve is normal in structure. Trivial mitral valve  regurgitation.   EKG:  EKG is ordered at her last appt and shows background atrial flutter, occasional atrial under sensing and subsequent atrial pacing, 100% ventricular paced rhythm.  The paced QRS is  broad 154 ms but has a positive R  wave in lead V1.   Lipid Panel     Component Value Date/Time   CHOL 138 10/07/2021 0817   TRIG 144 10/07/2021 0817   HDL 53 10/07/2021 0817   CHOLHDL 2.2 06/03/2020 0918   LDLCALC 60 10/07/2021 0817   BMET    Component Value Date/Time   NA 137 07/11/2022 1700   NA 141 10/07/2021 0817   K 3.8 07/11/2022 1700   K 4.2 09/13/2013 1609   CL 101 07/11/2022 1700   CO2 27 07/11/2022 1700   GLUCOSE 119 (H) 07/11/2022 1700   BUN 19 07/11/2022 1700   BUN 16 10/07/2021 0817   CREATININE 1.32 (H) 07/11/2022 1700   CREATININE 1.03 06/25/2015 1012   CALCIUM 9.8 07/11/2022 1700   GFRNONAA 39 (L) 07/11/2022 1700   GFRAA 51 (L) 10/03/2020 1029   04/07/2022 Cholesterol 128, HDL 51, LDL 50, triglycerides 159 Hemoglobin 13.2, creatinine 1.13, potassium 4.3, ALT 10  10/07/2021 Hemoglobin A1c 6.1%  ASSESSMENT:    No diagnosis found.    PLAN:  In order of problems listed above:  CHF, diastolic: Although she has the typical fatigue that occurs when she is in persistent atrial flutter, she still has NYHA functional class I  and appears clinically euvolemic.  No change in diuretics recommended.  At her usual dry weight 155-156 pounds.  She has borderline reduced LVEF, likely related to RV apical pacing.   PAFlutter/ Afib: Persistent for the last month.  In the past has associated heart failure exacerbation.  Seems to be tolerating better today, but will go ahead with scheduled cardioversion tomorrow.  CHB: Pacemaker dependent.  No underlying escape rhythm and she feels very poorly when we performed ventricular threshold testing. PPM: Normal device function other than some atrial undersensing.  Atrial sensitivity was adjusted today.  Remote download next week. Anticoagulation: Dose adjusted for age and renal function.  Has not had any bleeding complications in about 3 years now. HLP: All lipid parameters are in the desirable range.  No known CAD or PAD. PS s/p surgical valvotomy: Her murmurs  appear unchanged.  Mild to moderate regurgitation, no residual stenosis by echocardiogram.  No clinical signs of right heart failure today. ASD s/p patch closure: No evidence of residual shunt on multiple echoes. L PA aneurysm: 6.4 cm.  Plan conservative, non-surgical management.  No intention to pursue surgical repair.  Serial imaging not indicated. Anemia: She is off iron supplements for a while now and her radius hemoglobin is normal at 13.6.  No evidence of ongoing bleeding.  Shared Decision Making/Informed Consent The risks (stroke, cardiac arrhythmias rarely resulting in the need for a temporary or permanent pacemaker, skin irritation or burns and complications associated with conscious sedation including aspiration, arrhythmia, respiratory failure and death), benefits (restoration of normal sinus rhythm) and alternatives of a direct current cardioversion were explained in detail to [name] Baird and she agrees to proceed.      Medication Adjustments/Labs and Tests Ordered: Current medicines are reviewed at length with the patient today.  Concerns regarding medicines are outlined above.  Medication changes, Labs and Tests ordered today are listed in the Patient Instructions below. Patient Instructions  Medication Instructions:  NO CHANGES  *If you need a refill on your cardiac medications before your next appointment, please call your pharmacy*   Lab Work: BMET and CBC at Dr. Renaye Rakers's office today   If you have labs (blood work) drawn today and your tests are completely normal, you will receive your results only by: MyChart Message (if you have MyChart) OR A paper copy in the mail If you have any lab test that is abnormal or we need to change your treatment, we will call you to review the results.   Testing/Procedures: Cardioversion as scheduled 12/25/22   Follow-Up: At [facility], you and your health needs are our priority.  As part of our continuing mission to provide you  with exceptional heart care, we have created designated Provider Care Teams.  These Care Teams include your primary Cardiologist (physician) and Advanced Practice Providers (APPs -  Physician Assistants and Nurse Practitioners) who all work together to provide you with the care you need, when you need it.  We recommend signing up for the patient portal called "MyChart".  Sign up information is provided on this After Visit Summary.  MyChart is used to connect with patients for Virtual Visits (Telemedicine).  Patients are able to view lab/test results, encounter notes, upcoming appointments, etc.  Non-urgent messages can be sent to your provider as well.   To learn more about what you can do with MyChart, go to [email or web address].    Your next appointment:    AS SCHEDULED in March with Dr. Royann Shiversroitoru  Signed, Sanda Klein, MD  12/24/2022 8:14 AM    [facility] [place], [place], [place]  [address] Phone: [phone]; Fax: [phone]

## 2022-12-24 NOTE — Patient Instructions (Signed)
Medication Instructions:  NO CHANGES  *If you need a refill on your cardiac medications before your next appointment, please call your pharmacy*   Lab Work: BMET and CBC at Dr. Lurline Del office today   If you have labs (blood work) drawn today and your tests are completely normal, you will receive your results only by: [place] (if you have MyChart) OR A paper copy in the mail If you have any lab test that is abnormal or we need to change your treatment, we will call you to review the results.   Testing/Procedures: Cardioversion as scheduled 12/25/22   Follow-Up: At [facility], you and your health needs are our priority.  As part of our continuing mission to provide you with exceptional heart care, we have created designated Provider Care Teams.  These Care Teams include your primary Cardiologist (physician) and Advanced Practice Providers (APPs -  Physician Assistants and Nurse Practitioners) who all work together to provide you with the care you need, when you need it.  We recommend signing up for the patient portal called "MyChart".  Sign up information is provided on this After Visit Summary.  MyChart is used to connect with patients for Virtual Visits (Telemedicine).  Patients are able to view lab/test results, encounter notes, upcoming appointments, etc.  Non-urgent messages can be sent to your provider as well.   To learn more about what you can do with MyChart, go to [email or web address].    Your next appointment:    AS SCHEDULED in March with Dr. Sallyanne Kuster

## 2022-12-25 ENCOUNTER — Encounter (HOSPITAL_COMMUNITY)
Admission: RE | Disposition: A | Discharge status: Home / Self Care | Payer: Self-pay | Source: Home / Self Care | Attending: Cardiovascular Disease

## 2022-12-25 ENCOUNTER — Ambulatory Visit (HOSPITAL_COMMUNITY): Payer: Medicare Other | Admitting: Anesthesiology

## 2022-12-25 ENCOUNTER — Ambulatory Visit (HOSPITAL_BASED_OUTPATIENT_CLINIC_OR_DEPARTMENT_OTHER): Payer: Medicare Other | Admitting: Anesthesiology

## 2022-12-25 ENCOUNTER — Encounter (HOSPITAL_COMMUNITY): Payer: Self-pay | Admitting: Cardiovascular Disease

## 2022-12-25 ENCOUNTER — Ambulatory Visit (HOSPITAL_COMMUNITY)
Admission: RE | Admit: 2022-12-25 | Discharge: 2022-12-25 | Disposition: A | Discharge status: Home / Self Care | Payer: Medicare Other | Attending: Cardiovascular Disease | Admitting: Cardiovascular Disease

## 2022-12-25 ENCOUNTER — Other Ambulatory Visit: Payer: Self-pay

## 2022-12-25 DIAGNOSIS — F32A Depression, unspecified: Secondary | ICD-10-CM | POA: Insufficient documentation

## 2022-12-25 DIAGNOSIS — I700 Atherosclerosis of aorta: Secondary | ICD-10-CM | POA: Diagnosis not present

## 2022-12-25 DIAGNOSIS — M1990 Unspecified osteoarthritis, unspecified site: Secondary | ICD-10-CM | POA: Insufficient documentation

## 2022-12-25 DIAGNOSIS — Z7901 Long term (current) use of anticoagulants: Secondary | ICD-10-CM | POA: Diagnosis not present

## 2022-12-25 DIAGNOSIS — I509 Heart failure, unspecified: Secondary | ICD-10-CM | POA: Diagnosis not present

## 2022-12-25 DIAGNOSIS — I4892 Unspecified atrial flutter: Secondary | ICD-10-CM

## 2022-12-25 DIAGNOSIS — N189 Chronic kidney disease, unspecified: Secondary | ICD-10-CM

## 2022-12-25 DIAGNOSIS — Z950 Presence of cardiac pacemaker: Secondary | ICD-10-CM | POA: Diagnosis not present

## 2022-12-25 DIAGNOSIS — Z79899 Other long term (current) drug therapy: Secondary | ICD-10-CM | POA: Insufficient documentation

## 2022-12-25 DIAGNOSIS — I130 Hypertensive heart and chronic kidney disease with heart failure and stage 1 through stage 4 chronic kidney disease, or unspecified chronic kidney disease: Secondary | ICD-10-CM | POA: Diagnosis not present

## 2022-12-25 DIAGNOSIS — I5032 Chronic diastolic (congestive) heart failure: Secondary | ICD-10-CM | POA: Diagnosis not present

## 2022-12-25 DIAGNOSIS — I082 Rheumatic disorders of both aortic and tricuspid valves: Secondary | ICD-10-CM | POA: Insufficient documentation

## 2022-12-25 DIAGNOSIS — I484 Atypical atrial flutter: Secondary | ICD-10-CM | POA: Diagnosis not present

## 2022-12-25 DIAGNOSIS — F419 Anxiety disorder, unspecified: Secondary | ICD-10-CM | POA: Insufficient documentation

## 2022-12-25 DIAGNOSIS — I110 Hypertensive heart disease with heart failure: Secondary | ICD-10-CM | POA: Diagnosis not present

## 2022-12-25 HISTORY — PX: CARDIOVERSION: SHX1299

## 2022-12-25 SURGERY — CARDIOVERSION
Anesthesia: Monitor Anesthesia Care

## 2022-12-25 MED ORDER — PROPOFOL 10 MG/ML IV BOLUS
INTRAVENOUS | Status: DC | PRN
  Administered 2022-12-25: 40 mg via INTRAVENOUS

## 2022-12-25 MED ORDER — SODIUM CHLORIDE 0.9 % IV SOLN: INTRAVENOUS | Status: DC

## 2022-12-25 NOTE — Anesthesia Postprocedure Evaluation (Signed)
Anesthesia Post Note  Patient: [name] Baird  Procedure(s) Performed: CARDIOVERSION     Patient location during evaluation: Endoscopy Anesthesia Type: General Level of consciousness: awake and alert Pain management: pain level controlled Vital Signs Assessment: post-procedure vital signs reviewed and stable Respiratory status: spontaneous breathing, nonlabored ventilation and respiratory function stable Cardiovascular status: stable Postop Assessment: no apparent nausea or vomiting Anesthetic complications: no  No notable events documented.  Last Vitals:  Vitals:   12/25/22 1002 12/25/22 1012  BP: (!) 99/58 114/63  Pulse: 98 91  Resp: 20 (!) 21  Temp:    SpO2: 94% 96%    Last Pain:  Vitals:   12/25/22 1012  TempSrc:   PainSc: 0-No pain                 Chante Mayson

## 2022-12-25 NOTE — Interval H&P Note (Signed)
History and Physical Interval Note:  12/25/2022 7:54 AM  [name] Baird  has presented today for surgery, with the diagnosis of AFLUTTER.  The various methods of treatment have been discussed with the patient and family. After consideration of risks, benefits and other options for treatment, the patient has consented to  Procedure(s): CARDIOVERSION (N/A) as a surgical intervention.  The patient's history has been reviewed, patient examined, no change in status, stable for surgery.  I have reviewed the patient's chart and labs.  Questions were answered to the patient's satisfaction.     Kelton Bultman

## 2022-12-25 NOTE — Anesthesia Preprocedure Evaluation (Signed)
Anesthesia Evaluation  Patient identified by MRN, date of birth, ID band Patient awake    Reviewed: Allergy & Precautions, NPO status , Patient's Chart, lab work & pertinent test results  History of Anesthesia Complications Negative for: history of anesthetic complications  Airway Mallampati: III  TM Distance: >3 FB Neck ROM: Full    Dental  (+) Dental Advisory Given   Pulmonary shortness of breath   breath sounds clear to auscultation       Cardiovascular hypertension, Pt. on medications and Pt. on home beta blockers +CHF  + dysrhythmias + pacemaker  Rhythm:Irregular   1. Left ventricular ejection fraction, by estimation, is 45 to 50%. The  left ventricle has mildly decreased function. The left ventricle  demonstrates regional wall motion abnormalities with septal-lateral  dyssynchrony consistent with RV pacing. Left  ventricular diastolic parameters are consistent with Grade I diastolic  dysfunction (impaired relaxation).   2. Right ventricular systolic function is mildly reduced. The right  ventricular size is mildly enlarged. There is mildly elevated pulmonary  artery systolic pressure. The estimated right ventricular systolic  pressure is 16.1 mmHg.   3. Left atrial size was mildly dilated.   4. Right atrial size was mildly dilated.   5. Tricuspid valve regurgitation is moderate.   6. The aortic valve is tricuspid. Aortic valve regurgitation is mild.  Mild aortic valve sclerosis is present, with no evidence of aortic valve  stenosis.   7. Thickened and doming pulmonary valve. There does not appear to be  significant stenosis. There is mild to moderate pulmonic insufficiency.  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   8. Aortic dilatation noted. There is mild dilatation of the ascending  aorta, measuring 40 mm.   9. The inferior vena cava is normal in size with greater than 50%  respiratory variability,  suggesting right atrial pressure of 3 mmHg.  10. The mitral valve is normal in structure. Trivial mitral valve  regurgitation.     Neuro/Psych  PSYCHIATRIC DISORDERS Anxiety Depression    negative neurological ROS     GI/Hepatic negative GI ROS, Neg liver ROS,,,  Endo/Other    Renal/GU CRFRenal diseaseLab Results      Component                Value               Date                      CREATININE               1.31 (H)            12/24/2022                Musculoskeletal  (+) Arthritis ,    Abdominal   Peds  Hematology Lab Results      Component                Value               Date                      WBC                      5.3                 12/24/2022  HGB                      13.5                12/24/2022                HCT                      41.2                12/24/2022                MCV                      94                  12/24/2022                PLT                      198                 12/24/2022            Xarelto    Anesthesia Other Findings   Reproductive/Obstetrics negative OB ROS                             Anesthesia Physical Anesthesia Plan  ASA: 3  Anesthesia Plan: General   Post-op Pain Management: Minimal or no pain anticipated   Induction: Intravenous  PONV Risk Score and Plan: 3 and Treatment may vary due to age or medical condition  Airway Management Planned: Mask  Additional Equipment: None  Intra-op Plan:   Post-operative Plan:   Informed Consent: I have reviewed the patients History and Physical, chart, labs and discussed the procedure including the risks, benefits and alternatives for the proposed anesthesia with the patient or authorized representative who has indicated his/her understanding and acceptance.     Dental advisory given  Plan Discussed with: CRNA  Anesthesia Plan Comments:        Anesthesia Quick Evaluation

## 2022-12-25 NOTE — Discharge Instructions (Signed)

## 2022-12-25 NOTE — Transfer of Care (Signed)
Immediate Anesthesia Transfer of Care Note  Patient: [name] Baird  Procedure(s) Performed: CARDIOVERSION  Patient Location: Endoscopy Unit  Anesthesia Type:General  Level of Consciousness: drowsy  Airway & Oxygen Therapy: Patient Spontanous Breathing  Post-op Assessment: Report given to RN and Post -op Vital signs reviewed and stable  Post vital signs: Reviewed and stable  Last Vitals:  Vitals Value Taken Time  BP 117/65   Temp    Pulse 97   Resp 22   SpO2 98     Last Pain:  Vitals:   12/25/22 0808  TempSrc: Temporal  PainSc: 0-No pain         Complications: No notable events documented.

## 2022-12-25 NOTE — Op Note (Addendum)
Procedure: Electrical Cardioversion Indications:  Atrial Flutter  Procedure Details:  Consent: Risks of procedure as well as the alternatives and risks of each were explained to the (patient/caregiver).  Consent for procedure obtained.  Time Out: Verified patient identification, verified procedure, site/side was marked, verified correct patient position, special equipment/implants available, medications/allergies/relevent history reviewed, required imaging and test results available.  Performed  Patient placed on cardiac monitor, pulse oximetry, supplemental oxygen as necessary.  Sedation given:  propofol IV, Dr. Ermalene Postin Pacer pads placed anterior and posterior chest.  Cardioverted 1 time(s).  Cardioversion with synchronized biphasic 150J shock.  Evaluation: Findings: Post procedure EKG shows:  A paced and PACs, V paced Complications: None Patient did tolerate procedure well.  Prior to cardioversion, attempted atrial burst overdrive pacing unsuccessfully. After DCCV, temporarily paced at 90 bpm due to frequent PACs. Reprogrammed: post mode-switch pacing turned on. Normal PM function after the cardioversion.  Time Spent Directly with the Patient:  30 minutes   Latiesha Harada 12/25/2022, 10:00 AM

## 2022-12-27 ENCOUNTER — Encounter (HOSPITAL_COMMUNITY): Payer: Self-pay | Admitting: Cardiovascular Disease

## 2022-12-27 ENCOUNTER — Telehealth: Payer: Self-pay | Admitting: Physician Assistant

## 2022-12-27 NOTE — Telephone Encounter (Signed)
Pt family called stating patient was having chest tightness and shortness of breath.  Pt weight is stable.  O2 93-99% 123/84, HR 96  No relieving or exacerbating factors. No syncope. No palpitations, but may not be aware of return of flutter. Chest tightness is after eating peanut butter crackers (CP after eating is not new for her). Per family description, she is not in distress. I gave strict ER precautions. Family has already given an extra lasix, I agree. No significant edema, SOB is not worse in supine position. Concern for return of flutter. They will send a manual transmission for Dr. Sallyanne Kuster to review tomorrow morning.   We again discussed ER precautions.

## 2022-12-28 NOTE — Telephone Encounter (Signed)
Noted, remote scheduled for 1/22 - no charge.  Also, updated q 49mth home remote checks for the next year.

## 2022-12-28 NOTE — Telephone Encounter (Signed)
The patient daughter states they have sent a transmission.

## 2022-12-28 NOTE — Telephone Encounter (Signed)
Reviewed transmission and spoke with patient's daughter, Rojelio Brenner. After she received the extra dose of furosemide yesterday she had greater than usual urine output and gradually felt better.  This morning she feels well. The download shows that she is currently in normal rhythm (AV sequential paced rhythm).  She did have a 1-1/2-hour episode of atrial tachyarrhythmia yesterday morning at 7:30 AM that resolved spontaneously. Continue the same therapy.  Plan another download from her pacemaker in 2 weeks on January 22

## 2022-12-28 NOTE — Telephone Encounter (Signed)
Can we please get a download today? Had DC CV on Friday.

## 2022-12-28 NOTE — Telephone Encounter (Signed)
Spoke with patient's daughter. Patient is feeling fine today, no further symptoms. Did ask for daughter to send another transmission this am to verify presenting. Device report received this am 12/28/2022.  There is a noted atrial event that occurred on 12/27/2022 registering 1:31:36. Also, noted some elevated V-rate control on histogram.  Forwarding to Dr. Sallyanne Kuster to review.  Patient's daughter aware we will follow up after review with Dr. Sallyanne Kuster.     PRESENTING:   12/27/22 ATRIAL EPISODE:

## 2023-01-04 NOTE — Progress Notes (Signed)
Remote pacemaker transmission.   

## 2023-01-11 ENCOUNTER — Ambulatory Visit: Payer: Medicare Other | Attending: Cardiovascular Disease

## 2023-01-11 DIAGNOSIS — I442 Atrioventricular block, complete: Secondary | ICD-10-CM

## 2023-01-12 LAB — CUP PACEART REMOTE DEVICE CHECK
Battery Impedance: 1538 Ohm
Battery Remaining Longevity: 32 mo
Battery Voltage: 2.74 V
Brady Statistic AP VP Percent: 88 %
Brady Statistic AP VS Percent: 0 %
Brady Statistic AS VP Percent: 11 %
Brady Statistic AS VS Percent: 0 %
Date Time Interrogation Session: 20240122071512
Implantable Lead Connection Status: 753985
Implantable Lead Connection Status: 753985
Implantable Lead Implant Date: 20070920
Implantable Lead Implant Date: 20070920
Implantable Lead Location: 753859
Implantable Lead Location: 753860
Implantable Lead Model: 4592
Implantable Lead Model: 5092
Implantable Pulse Generator Implant Date: 20160707
Lead Channel Impedance Value: 504 Ohm
Lead Channel Impedance Value: 806 Ohm
Lead Channel Pacing Threshold Amplitude: 0.875 V
Lead Channel Pacing Threshold Amplitude: 1.375 V
Lead Channel Pacing Threshold Pulse Width: 0.4 ms
Lead Channel Pacing Threshold Pulse Width: 0.4 ms
Lead Channel Setting Pacing Amplitude: 2 V
Lead Channel Setting Pacing Amplitude: 2.75 V
Lead Channel Setting Pacing Pulse Width: 0.4 ms
Lead Channel Setting Sensing Sensitivity: 5.6 mV
Zone Setting Status: 755011
Zone Setting Status: 755011

## 2023-01-28 ENCOUNTER — Encounter (HOSPITAL_COMMUNITY): Payer: Self-pay | Admitting: *Deleted

## 2023-02-03 ENCOUNTER — Other Ambulatory Visit: Payer: Self-pay | Admitting: Cardiovascular Disease

## 2023-02-08 ENCOUNTER — Other Ambulatory Visit: Payer: Self-pay | Admitting: Nurse Practitioner

## 2023-02-08 DIAGNOSIS — F320 Major depressive disorder, single episode, mild: Secondary | ICD-10-CM

## 2023-02-25 NOTE — Addendum Note (Signed)
Addended by: Douglass Rivers D on: 02/25/2023 03:19 PM   Modules accepted: Level of Service

## 2023-02-25 NOTE — Progress Notes (Signed)
Remote pacemaker transmission.   

## 2023-03-01 ENCOUNTER — Ambulatory Visit: Payer: Medicare Other

## 2023-03-01 DIAGNOSIS — I442 Atrioventricular block, complete: Secondary | ICD-10-CM

## 2023-03-02 LAB — CUP PACEART REMOTE DEVICE CHECK
Battery Impedance: 1619 Ohm
Battery Remaining Longevity: 31 mo
Battery Voltage: 2.74 V
Brady Statistic AP VP Percent: 93 %
Brady Statistic AP VS Percent: 0 %
Brady Statistic AS VP Percent: 7 %
Brady Statistic AS VS Percent: 0 %
Date Time Interrogation Session: 20240311072229
Implantable Lead Connection Status: 753985
Implantable Lead Connection Status: 753985
Implantable Lead Implant Date: 20070920
Implantable Lead Implant Date: 20070920
Implantable Lead Location: 753859
Implantable Lead Location: 753860
Implantable Lead Model: 4592
Implantable Lead Model: 5092
Implantable Pulse Generator Implant Date: 20160707
Lead Channel Impedance Value: 519 Ohm
Lead Channel Impedance Value: 806 Ohm
Lead Channel Pacing Threshold Amplitude: 0.875 V
Lead Channel Pacing Threshold Amplitude: 1.375 V
Lead Channel Pacing Threshold Pulse Width: 0.4 ms
Lead Channel Pacing Threshold Pulse Width: 0.4 ms
Lead Channel Setting Pacing Amplitude: 2 V
Lead Channel Setting Pacing Amplitude: 2.75 V
Lead Channel Setting Pacing Pulse Width: 0.4 ms
Lead Channel Setting Sensing Sensitivity: 5.6 mV
Zone Setting Status: 755011
Zone Setting Status: 755011

## 2023-03-10 ENCOUNTER — Encounter: Payer: Self-pay | Admitting: Cardiovascular Disease

## 2023-03-10 ENCOUNTER — Ambulatory Visit: Payer: Medicare Other | Attending: Cardiovascular Disease | Admitting: Cardiovascular Disease

## 2023-03-10 VITALS — BP 128/68 | Resp 18 | Ht 62.0 in | Wt 158.8 lb

## 2023-03-10 DIAGNOSIS — I484 Atypical atrial flutter: Secondary | ICD-10-CM

## 2023-03-10 DIAGNOSIS — I442 Atrioventricular block, complete: Secondary | ICD-10-CM | POA: Diagnosis not present

## 2023-03-10 DIAGNOSIS — I5032 Chronic diastolic (congestive) heart failure: Secondary | ICD-10-CM

## 2023-03-10 DIAGNOSIS — D6869 Other thrombophilia: Secondary | ICD-10-CM

## 2023-03-10 DIAGNOSIS — Z950 Presence of cardiac pacemaker: Secondary | ICD-10-CM

## 2023-03-10 NOTE — Patient Instructions (Addendum)
Medication Instructions:  No changes *If you need a refill on your cardiac medications before your next appointment, please call your pharmacy*  Follow-Up: At [facility], you and your health needs are our priority.  As part of our continuing mission to provide you with exceptional heart care, we have created designated Provider Care Teams.  These Care Teams include your primary Cardiologist (physician) and Advanced Practice Providers (APPs -  Physician Assistants and Nurse Practitioners) who all work together to provide you with the care you need, when you need it.  We recommend signing up for the patient portal called "MyChart".  Sign up information is provided on this After Visit Summary.  MyChart is used to connect with patients for Virtual Visits (Telemedicine).  Patients are able to view lab/test results, encounter notes, upcoming appointments, etc.  Non-urgent messages can be sent to your provider as well.   To learn more about what you can do with MyChart, go to [email or web address].    Your next appointment:   6 month(s)- Pacer check  Provider:   Sanda Klein, MD

## 2023-03-10 NOTE — Progress Notes (Signed)
Patient ID: [name] Baird, female   DOB: 28-Jul-1933, 89 y.o.   MRN: [id number]     Cardiology Office Note    Date:  03/10/2023   ID:  [name] Baird, DOB March 27, 1933, MRN [id number]  PCP:  Collene Leyden, MD  Cardiologist:   Sanda Klein, MD   Chief Complaint  Patient presents with   Atrial Fibrillation    History of Present Illness:  [name] Baird is a 89 y.o. female with a history of congenital heart disease (atrial septal defect status post patch closure), pulmonic stenosis (status post valvotomy 1985), left pulmonary artery aneurysm, complete heart block status post dual-chamber permanent pacemaker implantation (Medtronic, last generator change 2014) and infrequent paroxysmal atrial fibrillation (atrial flutter cavotricuspid isthmus ablation 2014). She has history of diastolic heart failure with infrequent exacerbation, most recently following an episode of lower GI bleeding with severe anemia in March 2021.  Xarelto was switched to Eliquis after that episode of bleeding.  Atypical atrial flutter continues to be a recurrent problem.  She had successful overdrive pacing of the arrhythmia in April 2023 with improvement in her symptoms of fatigue and reduced stamina.  We tried to do this again for a lengthy episode of atrial flutter in August 2023, unsuccessfully.  However, when she presented for cardioversion on September 27 she was back in normal rhythm spontaneously. She had DC CV for a prolonged 1 month episode on 12/25/2022. She had a single brief recurrence (1.5 hours on 12/27/2022), otherwise no events.  She was programmed at a lower rate of 90 after the CV, due to very frequent PACs. Today, I turned the lower rate limit back to 70 bpm and turned AF suppression on. Estimated generator longevity is 2.5 years and all lead parameters are good. There have been no episodes of VT.  She has mild ankle swelling towards the end of the day, but resolved by morning. Every 1-2 weeks  she will have a dy when she is more dyspneic, but this resolves spontaneously. She denies orthopnea, PND, chest pan, dizziness or syncope. Denies falls and bleeding. Continues to have a lot of right shoulder pain with movement.  On her home scale her weight remains stable in the 155-156 pounds range, a couple of pounds higher in the office.  She has rarely required any adjustments in her diuretic dose (usually takes furosemide 80 mg 3 days a week and 40 mg 4 days a week).  Her weight has drifted down slightly over the last few years.  Currently appears to be stable at about 155-156 pounds.  Otherwise pacemaker function is normal.  When not in atrial flutter she has only 70% atrial pacing with good heart rate histogram distribution.  She is pacemaker dependent with 100% ventricular pacing.  There is occasional under sensing/blanking of the flutter waves.  Her pacemaker leads are the original ones from 2007 but still have excellent parameters.    In May 2019 she saw Dr. Marye Round Zwischenberger at [facility] to discuss treatment for pulmonary artery aneurysm.  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  Conservative management was recommended.  Echo performed there showed a left ventricular ejection fraction of 45-50%, severe left atrial enlargement, moderate regurgitation of all 4 cardiac valves, in addition to the dilated pulmonary artery and left pulmonary artery aneurysm.  In April 2017 she had one episode that lasted for 6 hours.  In March 2018 the atrial fibrillation  only lasted for just over 2 minutes.  She had a 7 beat run of nonsustained ventricular tachycardia in March 2020.  She had atrial flutter ablation performed by Dr. Rayann Heman in July 2014 and is not taking any antiarrhythmics at this time, other than a low dose of beta blocker. At the time of the EP study she was noted to have conventional counterclockwise isthmus-dependent right  atrial flutter but then a second type of right atrial flutter was noted briefly. It could not be reinduced for full study.  She has done poorly on conventional beta-blockers (atenolol, metoprolol), but tolerates Bystolic well.   Past Medical History:  Diagnosis Date   Acute GI bleeding 03/06/2020   Anxiety    Atrial flutter ([place])    s/p ablation 06/29/2013 by Dr Rayann Heman   CHF (congestive heart failure) ([place])    Complete heart block ([place]) 2007   s/p medtronic pacemaker implantation   Depression    Glaucoma    Hypertension    Pulmonary stenosis sp valvotomy        Shortness of breath    Status post patch closure of ASD     Past Surgical History:  Procedure Laterality Date   ABLATION  06/29/2013   s/p isthmus ablation for atrial flutter by Dr Rayann Heman   AORTIC VALVE REPAIR     ATRIAL FLUTTER ABLATION N/A 06/29/2013   Procedure: ATRIAL FLUTTER ABLATION;  Surgeon: Thompson Grayer, MD;  Location: [facility] CATH LAB;  Service: Cardiovascular;  Laterality: N/A;   CARDIAC CATHETERIZATION N/A 06/27/2015   Procedure: Temporary Pacemaker;  Surgeon: Sanda Klein, MD;  Location: [place] CV LAB;  Service: Cardiovascular;  Laterality: N/A;   CARDIOVERSION N/A 03/24/2013   Procedure: CARDIOVERSION;  Surgeon: Sanda Klein, MD;  Location: [place] ENDOSCOPY;  Service: Cardiovascular;  Laterality: N/A;   CARDIOVERSION N/A 12/25/2022   Procedure: CARDIOVERSION;  Surgeon: Sanda Klein, MD;  Location: [place] ENDOSCOPY;  Service: Cardiovascular;  Laterality: N/A;   COLONOSCOPY WITH PROPOFOL Left 03/08/2020   Procedure: COLONOSCOPY WITH PROPOFOL;  Surgeon: Arta Silence, MD;  Location: [place];  Service: Endoscopy;  Laterality: Left;   EP IMPLANTABLE DEVICE N/A 06/27/2015   Procedure: PPM Generator Changeout;  Surgeon: Sanda Klein, MD;  Location: [place] CV LAB;  Service: Cardiovascular;  Laterality: N/A;   ESOPHAGOGASTRODUODENOSCOPY (EGD) WITH PROPOFOL Left 03/07/2020   Procedure: ESOPHAGOGASTRODUODENOSCOPY (EGD)  WITH PROPOFOL;  Surgeon: Arta Silence, MD;  Location: [facility] ENDOSCOPY;  Service: Endoscopy;  Laterality: Left;   PACEMAKER INSERTION  2007   Medtronic Adapta ADDRL1, serial # [id number]   pulmonary valvotomy  1985   trigeminal sx     left side    Outpatient Medications Prior to Visit  Medication Sig Dispense Refill   acetaminophen (TYLENOL) 650 MG CR tablet Take 650 mg by mouth in the morning and at bedtime.     Calcium Carb-Cholecalciferol (CALCIUM 600/VITAMIN D3 PO) Take 1 tablet by mouth daily.     candesartan (ATACAND) 8 MG tablet TAKE 1 TABLET BY MOUTH EVERY DAY 90 tablet 3   cholecalciferol (VITAMIN D3) 25 MCG (1000 UNIT) tablet Take 1,000 Units by mouth every other day.     clonazePAM (KLONOPIN) 0.5 MG tablet Take half tablet every other day as needed, for anxiety (Patient taking differently: Take 0.25 mg by mouth at bedtime. Take 1/4 tablet every day as needed, for anxiety) 15 tablet 0   dorzolamide-timolol (COSOPT) 22.3-6.8 MG/ML ophthalmic solution Place 1 drop into both eyes 2 (two) times daily.  erythromycin ophthalmic ointment Place 1 Application into the left eye in the morning and at bedtime.     famotidine (PEPCID) 20 MG tablet Take 20 mg by mouth daily.     furosemide (LASIX) 40 MG tablet TAKE 1 TABLET BY MOUTH TWICE A DAY 180 tablet 3   latanoprost (XALATAN) 0.005 % ophthalmic solution Place 1 drop into both eyes at bedtime.     Multiple Vitamins-Minerals (MULTIVITAMIN PO) Take 1 tablet by mouth daily.      nebivolol (BYSTOLIC) 10 MG tablet TAKE 1 TABLET BY MOUTH EVERY DAY 90 tablet 3   nystatin (MYCOSTATIN/NYSTOP) powder Apply 1 Application topically daily as needed (irritation).     polyvinyl alcohol (ARTIFICIAL TEARS) 1.4 % ophthalmic solution Place 1 drop into both eyes as needed for dry eyes.     potassium chloride (KLOR-CON M10) 10 MEQ tablet TAKE 2 TABLETS BY MOUTH TWICE A DAY (Patient taking differently: Take 10-20 mEq by mouth See admin instructions. Take 20 meq  by mouth on Monday, Wednesday and Friday Take 10 meq by mouth on Tuesday, Thursday Saturday and Sunday) 360 tablet 3   RHOPRESSA 0.02 % SOLN Place 1 drop into the right eye every other day.     rosuvastatin (CRESTOR) 10 MG tablet TAKE 1 TABLET BY MOUTH EVERYDAY AT BEDTIME 90 tablet 1   traMADol (ULTRAM) 50 MG tablet Take 50 mg by mouth daily as needed for severe pain. (Patient not taking: Reported on 12/24/2022)     [place] Name: Heel protector boot for right ankle Apply at bedtime to promote healing of your pressure ulcer. 1 each 0   venlafaxine XR (EFFEXOR-XR) 75 MG 24 hr capsule TAKE 1 CAPSULE BY MOUTH EVERY DAY (Patient taking differently: Take 75 mg by mouth at bedtime. TAKE 1 CAPSULE BY MOUTH EVERY DAY) 90 capsule 1   White Petrolatum-Mineral Oil (GENTEAL TEARS NIGHT-TIME) OINT Place 1 application  into the right eye at bedtime.     XARELTO 15 MG TABS tablet TAKE 1 TABLET (15 MG TOTAL) BY MOUTH DAILY WITH SUPPER 90 tablet 1   No facility-administered medications prior to visit.     Allergies:   Sulfa antibiotics and Hydrocodone   Social History   Socioeconomic History   Marital status: Widowed    Spouse name: Not on file   Number of children: 2   Years of education: Not on file   Highest education level: Not on file  Occupational History   Occupation: retired/homemaker  Tobacco Use   Smoking status: Never   Smokeless tobacco: Never  Vaping Use   Vaping Use: Never used  Substance and Sexual Activity   Alcohol use: No   Drug use: No   Sexual activity: Never  Other Topics Concern   Not on file  Social History Narrative   Not on file   Social Determinants of Health   Financial Resource Strain: Low Risk  (05/12/2021)   Overall Financial Resource Strain (CARDIA)    Difficulty of Paying Living Expenses: Not hard at all  Food Insecurity: No Food Insecurity (05/12/2021)   Hunger Vital Sign    Worried About Running Out of Food in the Last Year: Never true    [place] in the Last Year: Never true  Transportation Needs: No Transportation Needs (05/12/2021)   PRAPARE - Hydrologist (Medical): No    Lack of Transportation (Non-Medical): No  Physical Activity: Insufficiently Active (05/12/2021)   Exercise  Vital Sign    Days of Exercise per Week: 3 days    Minutes of Exercise per Session: 30 min  Stress: No Stress Concern Present (05/12/2021)   [place]    Feeling of Stress : Not at all  Social Connections: Moderately Isolated (05/12/2021)   Social Connection and Isolation Panel [NHANES]    Frequency of Communication with Friends and Family: More than three times a week    Frequency of Social Gatherings with Friends and Family: More than three times a week    Attends Religious Services: More than 4 times per year    Active Member of [employer] or Organizations: No    Attends Archivist Meetings: Never    Marital Status: Widowed    ROS:   Please see the history of present illness.    All other systems are reviewed and are negative.    PHYSICAL EXAM:   VS:  BP 128/68 (BP Location: Left Arm, Patient Position: Sitting, Cuff Size: Normal)   Resp 18   Ht 5\' 2"  (1.575 m)   Wt 158 lb 12.8 oz (72 kg)   SpO2 93%   BMI 29.04 kg/m      General: Alert, oriented x3, no distress, healthy R subclavian PM site Head: no evidence of trauma, PERRL, EOMI, no exophtalmos or lid lag, no myxedema, no xanthelasma; normal ears, nose and oropharynx Neck: normal jugular venous pulsations and no hepatojugular reflux; brisk carotid pulses without delay and no carotid bruits Chest: clear to auscultation, no signs of consolidation by percussion or palpation, normal fremitus, symmetrical and full respiratory excursions Cardiovascular: normal position and quality of the apical impulse, regular rhythm, normal first and second heart sounds, 2/6 systolic and 1/6 diastolic RUSB  murmurs, no apical murmurs, rubs or gallops Abdomen: no tenderness or distention, no masses by palpation, no abnormal pulsatility or arterial bruits, normal bowel sounds, no hepatosplenomegaly Extremities: no clubbing, cyanosis or edema; 2+ radial, ulnar and brachial pulses bilaterally; 2+ right femoral, posterior tibial and dorsalis pedis pulses; 2+ left femoral, posterior tibial and dorsalis pedis pulses; no subclavian or femoral bruits Neurological: grossly nonfocal Psych: Normal mood and affect       Wt Readings from Last 3 Encounters:  03/10/23 158 lb 12.8 oz (72 kg)  12/24/22 154 lb 6.4 oz (70 kg)  11/23/22 158 lb 9.6 oz (71.9 kg)    Studies/Labs Reviewed:   ECHO 10/24/2020:  1. Left ventricular ejection fraction, by estimation, is 45 to 50%. The  left ventricle has mildly decreased function. The left ventricle  demonstrates regional wall motion abnormalities with septal-lateral  dyssynchrony consistent with RV pacing. Left  ventricular diastolic parameters are consistent with Grade I diastolic  dysfunction (impaired relaxation).   2. Right ventricular systolic function is mildly reduced. The right  ventricular size is mildly enlarged. There is mildly elevated pulmonary  artery systolic pressure. The estimated right ventricular systolic  pressure is [id number] mmHg.   3. Left atrial size was mildly dilated.   4. Right atrial size was mildly dilated.   5. Tricuspid valve regurgitation is moderate.   6. The aortic valve is tricuspid. Aortic valve regurgitation is mild.  Mild aortic valve sclerosis is present, with no evidence of aortic valve  stenosis.   7. Thickened and doming pulmonary valve. There does not appear to be  significant stenosis. There is mild to moderate pulmonic insufficiency.  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  8. Aortic dilatation noted. There is mild dilatation of the ascending  aorta, measuring 40 mm.   9. The inferior vena cava is normal in  size with greater than 50%  respiratory variability, suggesting right atrial pressure of 3 mmHg.  10. The mitral valve is normal in structure. Trivial mitral valve  regurgitation.   EKG:  EKG is not ordered today. The intracardiac EGM shows AV sequential pacing at 90 bpm. There is no underlying atrial rhythm when pacing is reduced to 60 bpm, but occasional PACs are seen.  Lipid Panel     Component Value Date/Time   CHOL 138 10/07/2021 0817   TRIG 144 10/07/2021 0817   HDL 53 10/07/2021 0817   CHOLHDL 2.2 06/03/2020 0918   LDLCALC 60 10/07/2021 0817   BMET    Component Value Date/Time   NA 139 12/24/2022 0928   K 4.3 12/24/2022 0928   K 4.2 09/13/2013 1609   CL 102 12/24/2022 0928   CO2 28 12/24/2022 0928   GLUCOSE 134 (H) 12/24/2022 0928   GLUCOSE 119 (H) 07/11/2022 1700   BUN 22 12/24/2022 0928   CREATININE 1.31 (H) 12/24/2022 0928   CREATININE 1.03 06/25/2015 1012   CALCIUM 9.7 12/24/2022 0928   GFRNONAA 39 (L) 07/11/2022 1700   GFRAA 51 (L) 10/03/2020 1029   04/07/2022 Cholesterol 128, HDL 51, LDL 50, triglycerides 159 Hemoglobin 13.2, creatinine 1.13, potassium 4.3, ALT 10  10/07/2021 Hemoglobin A1c 6.1%  ASSESSMENT:    1. Chronic diastolic heart failure ([place])   2. Atypical atrial flutter (HCC)   3. CHB (complete heart block) (HCC)   4. Pacemaker   5. Acquired thrombophilia ([place])       PLAN:  In order of problems listed above:  CHF, diastolic: Appears to be compensated. Clinically euvolemic.  No change in diuretics recommended.  At her usual dry weight 155-156 pounds.  She has borderline reduced LVEF, likely related to RV apical pacing.   PAFlutter/ Afib: Successful cardioversion in early January with only a single 90 minute breakthrough event.  CHADSVasc4 (age 22, gender, HF). CHB: Pacemaker dependent.  No underlying escape rhythm and she feels very poorly when we performed ventricular threshold testing. PPM: Normal device function other than some atrial  undersensing.  Lower rate limit changed back to 70, with atrial preference pacing turned on.   Remote download every 3 months. Anticoagulation: Dose adjusted for age and renal function.  Has not had any bleeding complications in about 3 years now. HLP: All lipid parameters are in the desirable range.  No known CAD or PAD. PS s/p surgical valvotomy: Her murmurs appear unchanged.  Mild to moderate regurgitation, no residual stenosis by echocardiogram.  No clinical signs of right heart failure today. ASD s/p patch closure: No evidence of residual shunt on multiple echoes. L PA aneurysm: 6.4 cm.  Plan conservative, non-surgical management.  No intention to pursue surgical repair.  Serial imaging not indicated. Anemia: She is off iron supplements for a while now and her latest hemoglobin is normal at 13.6.  No evidence of ongoing bleeding.  Shared Decision Making/Informed Consent The risks (stroke, cardiac arrhythmias rarely resulting in the need for a temporary or permanent pacemaker, skin irritation or burns and complications associated with conscious sedation including aspiration, arrhythmia, respiratory failure and death), benefits (restoration of normal sinus rhythm) and alternatives of a direct current cardioversion were explained in detail to Ms. Salaam and she agrees to proceed.      Medication Adjustments/Labs and Tests Ordered:  Current medicines are reviewed at length with the patient today.  Concerns regarding medicines are outlined above.  Medication changes, Labs and Tests ordered today are listed in the Patient Instructions below. Patient Instructions  Medication Instructions:  No changes *If you need a refill on your cardiac medications before your next appointment, please call your pharmacy*  Follow-Up: At [facility], you and your health needs are our priority.  As part of our continuing mission to provide you with exceptional heart care, we have created designated  Provider Care Teams.  These Care Teams include your primary Cardiologist (physician) and Advanced Practice Providers (APPs -  Physician Assistants and Nurse Practitioners) who all work together to provide you with the care you need, when you need it.  We recommend signing up for the patient portal called "MyChart".  Sign up information is provided on this After Visit Summary.  MyChart is used to connect with patients for Virtual Visits (Telemedicine).  Patients are able to view lab/test results, encounter notes, upcoming appointments, etc.  Non-urgent messages can be sent to your provider as well.   To learn more about what you can do with MyChart, go to [email or web address].    Your next appointment:   6 month(s)- Pacer check  Provider:   Sanda Klein, MD       Signed, Sanda Klein, MD  03/10/2023 1:10 PM    [address], [place], [place]  [address] Phone: [phone]; Fax: [phone]

## 2023-04-07 NOTE — Progress Notes (Signed)
Remote pacemaker transmission.   

## 2023-05-04 ENCOUNTER — Encounter: Payer: Self-pay | Admitting: Cardiovascular Disease

## 2023-05-04 DIAGNOSIS — R0602 Shortness of breath: Secondary | ICD-10-CM

## 2023-05-04 DIAGNOSIS — I5032 Chronic diastolic (congestive) heart failure: Secondary | ICD-10-CM

## 2023-05-05 NOTE — Telephone Encounter (Signed)
Thanks ,there does not appear to be an arrhythmia cause for her complaints

## 2023-05-05 NOTE — Telephone Encounter (Signed)
Reviewed transmission: Unscheduled manual transmission received.  Normal device function, 2 VHR, slight irregularity 8 & 14 beats of NSVT vs AF with RVR 1 AHR, duration 11sec, controlled rate.  Hx of PAF, Eliquis per PA report Follow up as scheduled [place], CVRS  Per Dr. Royann Shivers none of the above referenced events should make patient feel bad.  Forwarding to Dr. Royann Shivers for further review.  Patient responded to in [place] and ER precautions given.

## 2023-05-06 NOTE — Telephone Encounter (Signed)
Called [name] Baird- daughter (dpr) to let her know that Dr Royann Shivers would like for her mother to get a BNP, it is ok to get it in [place].   No answer- left message with call back number.   Order placed for BNP

## 2023-05-06 NOTE — Telephone Encounter (Signed)
[name] Baird called back- she will bring her mother to the office at [facility] in the morning to have labs drawn= BNP; order already placed. Advised that she does not need to be fasting. Verbalized understanding.

## 2023-05-06 NOTE — Telephone Encounter (Signed)
Can we please have her get a BNP checked, okay to do in [place].  Thank you

## 2023-05-08 LAB — BASIC METABOLIC PANEL
BUN/Creatinine Ratio: 14 (ref 12–28)
BUN: 17 mg/dL (ref 8–27)
CO2: 23 mmol/L (ref 20–29)
Calcium: 9.6 mg/dL (ref 8.7–10.3)
Chloride: 101 mmol/L (ref 96–106)
Creatinine, Ser: 1.19 mg/dL — ABNORMAL HIGH (ref 0.57–1.00)
Glucose: 116 mg/dL — ABNORMAL HIGH (ref 70–99)
Potassium: 4.6 mmol/L (ref 3.5–5.2)
Sodium: 139 mmol/L (ref 134–144)
eGFR: 44 mL/min/{1.73_m2} — ABNORMAL LOW (ref >59)

## 2023-05-08 LAB — CBC
Hematocrit: 38.5 % (ref 34.0–46.6)
Hemoglobin: 12.5 g/dL (ref 11.1–15.9)
MCH: 30.3 pg (ref 26.6–33.0)
MCHC: 32.5 g/dL (ref 31.5–35.7)
MCV: 93 fL (ref 79–97)
Platelets: 210 10*3/uL (ref 150–450)
RBC: 4.12 x10E6/uL (ref 3.77–5.28)
RDW: 12 % (ref 11.7–15.4)
WBC: 5.9 10*3/uL (ref 3.4–10.8)

## 2023-05-23 ENCOUNTER — Other Ambulatory Visit: Payer: Self-pay | Admitting: Cardiovascular Disease

## 2023-05-24 NOTE — Telephone Encounter (Signed)
Prescription refill request for Xarelto received.  Indication: afib  Last office visit: [place], 03/10/2023 Weight: 72 kg  Age: 89 yo  Scr: 1.19, 05/07/2023 CrCl: 37 ml/min   Refill sent.

## 2023-05-31 ENCOUNTER — Ambulatory Visit (INDEPENDENT_AMBULATORY_CARE_PROVIDER_SITE_OTHER): Payer: Medicare Other

## 2023-05-31 DIAGNOSIS — I442 Atrioventricular block, complete: Secondary | ICD-10-CM | POA: Diagnosis not present

## 2023-05-31 LAB — CUP PACEART REMOTE DEVICE CHECK
Battery Impedance: 1765 Ohm
Battery Remaining Longevity: 31 mo
Battery Voltage: 2.74 V
Brady Statistic AP VP Percent: 83 %
Brady Statistic AP VS Percent: 0 %
Brady Statistic AS VP Percent: 17 %
Brady Statistic AS VS Percent: 0 %
Date Time Interrogation Session: 20240610062647
Implantable Lead Connection Status: 753985
Implantable Lead Connection Status: 753985
Implantable Lead Implant Date: 20070920
Implantable Lead Implant Date: 20070920
Implantable Lead Location: 753859
Implantable Lead Location: 753860
Implantable Lead Model: 4592
Implantable Lead Model: 5092
Implantable Pulse Generator Implant Date: 20160707
Lead Channel Impedance Value: 504 Ohm
Lead Channel Impedance Value: 826 Ohm
Lead Channel Pacing Threshold Amplitude: 0.75 V
Lead Channel Pacing Threshold Amplitude: 1.375 V
Lead Channel Pacing Threshold Pulse Width: 0.4 ms
Lead Channel Pacing Threshold Pulse Width: 0.4 ms
Lead Channel Setting Pacing Amplitude: 2 V
Lead Channel Setting Pacing Amplitude: 2.75 V
Lead Channel Setting Pacing Pulse Width: 0.4 ms
Lead Channel Setting Sensing Sensitivity: 5.6 mV
Zone Setting Status: 755011
Zone Setting Status: 755011

## 2023-06-21 NOTE — Progress Notes (Signed)
Remote pacemaker transmission.   

## 2023-07-02 ENCOUNTER — Encounter: Payer: Self-pay | Admitting: Cardiovascular Disease

## 2023-07-05 ENCOUNTER — Ambulatory Visit: Payer: Medicare Other | Attending: Physician Assistant | Admitting: Physician Assistant

## 2023-07-05 ENCOUNTER — Encounter: Payer: Self-pay | Admitting: Physician Assistant

## 2023-07-05 VITALS — BP 136/76 | HR 94 | Ht 62.0 in | Wt 160.0 lb

## 2023-07-05 DIAGNOSIS — I5032 Chronic diastolic (congestive) heart failure: Secondary | ICD-10-CM

## 2023-07-05 DIAGNOSIS — Z8774 Personal history of (corrected) congenital malformations of heart and circulatory system: Secondary | ICD-10-CM

## 2023-07-05 DIAGNOSIS — R0600 Dyspnea, unspecified: Secondary | ICD-10-CM | POA: Diagnosis not present

## 2023-07-05 DIAGNOSIS — I442 Atrioventricular block, complete: Secondary | ICD-10-CM | POA: Diagnosis not present

## 2023-07-05 DIAGNOSIS — I4892 Unspecified atrial flutter: Secondary | ICD-10-CM | POA: Diagnosis not present

## 2023-07-05 MED ORDER — FUROSEMIDE 40 MG PO TABS: ORAL_TABLET | ORAL | 3 refills | Status: DC

## 2023-07-05 NOTE — Patient Instructions (Signed)
Medication Instructions:  Your physician recommends that you continue on your current medications as directed. Please refer to the Current Medication list given to you today.  *If you need a refill on your cardiac medications before your next appointment, please call your pharmacy*   Lab Work: LABS WILL BE DRAWN TODAY If you have labs (blood work) drawn today and your tests are completely normal, you will receive your results only by: MyChart Message (if you have MyChart) OR A paper copy in the mail If you have any lab test that is abnormal or we need to change your treatment, we will call you to review the results.   Testing/Procedures: Your physician has requested that you have an echocardiogram. Echocardiography is a painless test that uses sound waves to create images of your heart. It provides your doctor with information about the size and shape of your heart and how well your heart's chambers and valves are working. This procedure takes approximately one hour. There are no restrictions for this procedure. Please do NOT wear cologne, perfume, or lotions (deodorant is allowed). Please arrive 15 minutes prior to your appointment time.    Follow-Up: At [facility], you and your health needs are our priority.  As part of our continuing mission to provide you with exceptional heart care, we have created designated Provider Care Teams.  These Care Teams include your primary Cardiologist (physician) and Advanced Practice Providers (APPs -  Physician Assistants and Nurse Practitioners) who all work together to provide you with the care you need, when you need it.  We recommend signing up for the patient portal called "MyChart".  Sign up information is provided on this After Visit Summary.  MyChart is used to connect with patients for Virtual Visits (Telemedicine).  Patients are able to view lab/test results, encounter notes, upcoming appointments, etc.  Non-urgent messages can be sent  to your provider as well.   To learn more about what you can do with MyChart, go to [email or web address].    Your next appointment:   KEEP SCHEDULED APPOINTMENT 08/26/2023 9:40 AM   Provider:   Thurmon Fair, MD

## 2023-07-05 NOTE — Progress Notes (Unsigned)
  Cardiology Office Note:  .   Date:  07/05/2023  ID:  Baron Sane Kirtz, DOB [birth year], MRN [id number] PCP: Irven Coe, MD  [facility] Providers Cardiologist:  Thurmon Fair, MD     History of Present Illness: [name] Kitchen   [name] Baird is a 89 y.o. female with past medical history of atrial septal defect s/p patch closure, pulmonic stenosis s/p valvotomy 1985, left pulmonary artery aneurysm, CHB s/p Medtronic dual-chamber PPM, atrial flutter ablation 2014, infrequent paroxysmal atrial fibrillation, chronic diastolic heart failure with infrequent exacerbation and GI bleed.  She was seen by Dr. Alvester Morin at [facility] in May 2019 for treatment of pulmonic artery aneurysm, she had a 6.4 cm left pulmonary artery aneurysm, main pulmonary artery mildly enlarged at 3.5 cm, conservative management was recommended.  Echocardiogram showed EF 45 to 50%, severe LAE, moderate regurgitation in all 4 cardiac valves, dilated pulmonic artery and the left pulmonary artery aneurysm.  She is 100% pacemaker dependent.  She had a GI bleed in March 2021, Xarelto was switched to Eliquis after the GI bleed.  She underwent successful overdrive pacing out of atrial flutter in April 2023 which improved her symptom of fatigue.  Overdrive pacing was attempted again for atrial flutter again in August 2023 unsuccessfully.  When she presented for outpatient cardioversion in September 2023, she was already back in sinus rhythm.  She underwent outpatient DCCV for a prolonged 1 month episode of atrial flutter in January 2024.  Patient presents today for evaluation of shortness of breath on exertion, orthopnea and PND that occurred in the past 2 to 3 weeks.  She lost her brother about a month ago.  She has been eating more salty food.  Right now, she is on 40 mg twice a day of Lasix Monday Wednesday Friday and daily dosing of Lasix on all the other days.  Her daughter gave her extra 40 mg of Lasix twice in the past week.   On physical exam, she appears to be euvolemic.  Her lung is clear, she has no JVD.  We did a device interrogation, there is no report of recurrent A-fib or atrial flutter.  We will obtain basic metabolic panel, CBC and BNP.  If blood workup negative, she will proceed with echocardiogram.  She can follow-up with Dr. Royann Shivers in 2 months.  ROS: ***  Studies Reviewed: .        *** Risk Assessment/Calculations:   {Does this patient have ATRIAL FIBRILLATION?[phone]}         Physical Exam:   VS:  BP 136/76   Pulse 94   Ht 5\' 2"  (1.575 m)   Wt 160 lb (72.6 kg)   BMI 29.26 kg/m    Wt Readings from Last 3 Encounters:  07/05/23 160 lb (72.6 kg)  03/10/23 158 lb 12.8 oz (72 kg)  12/24/22 154 lb 6.4 oz (70 kg)    GEN: Well nourished, well developed in no acute distress NECK: No JVD; No carotid bruits CARDIAC: ***RRR, no murmurs, rubs, gallops RESPIRATORY:  Clear to auscultation without rales, wheezing or rhonchi  ABDOMEN: Soft, non-tender, non-distended EXTREMITIES:  No edema; No deformity   ASSESSMENT AND PLAN: .   ***    {Are you ordering a CV Procedure (e.g. stress test, cath, DCCV, TEE, etc)?   Press F2        :[id number]}  Dispo: ***  Signed, Azalee Course, PA

## 2023-07-06 LAB — BRAIN NATRIURETIC PEPTIDE: BNP: 398.1 pg/mL — ABNORMAL HIGH (ref 0.0–100.0)

## 2023-07-06 LAB — BASIC METABOLIC PANEL
BUN/Creatinine Ratio: 11 — ABNORMAL LOW (ref 12–28)
BUN: 14 mg/dL (ref 8–27)
CO2: 25 mmol/L (ref 20–29)
Calcium: 9.9 mg/dL (ref 8.7–10.3)
Chloride: 101 mmol/L (ref 96–106)
Creatinine, Ser: 1.27 mg/dL — ABNORMAL HIGH (ref 0.57–1.00)
Glucose: 100 mg/dL — ABNORMAL HIGH (ref 70–99)
Potassium: 5.1 mmol/L (ref 3.5–5.2)
Sodium: 145 mmol/L — ABNORMAL HIGH (ref 134–144)
eGFR: 41 mL/min/{1.73_m2} — ABNORMAL LOW (ref >59)

## 2023-07-06 LAB — CBC
Hematocrit: 40.1 % (ref 34.0–46.6)
Hemoglobin: 12.7 g/dL (ref 11.1–15.9)
MCH: 29.5 pg (ref 26.6–33.0)
MCHC: 31.7 g/dL (ref 31.5–35.7)
MCV: 93 fL (ref 79–97)
Platelets: 203 10*3/uL (ref 150–450)
RBC: 4.31 x10E6/uL (ref 3.77–5.28)
RDW: 11.8 % (ref 11.7–15.4)
WBC: 5.8 10*3/uL (ref 3.4–10.8)

## 2023-07-21 ENCOUNTER — Other Ambulatory Visit: Payer: Self-pay | Admitting: Cardiovascular Disease

## 2023-07-29 ENCOUNTER — Ambulatory Visit (HOSPITAL_COMMUNITY): Payer: Medicare Other | Attending: Physician Assistant

## 2023-07-29 DIAGNOSIS — R0600 Dyspnea, unspecified: Secondary | ICD-10-CM | POA: Diagnosis present

## 2023-08-02 ENCOUNTER — Other Ambulatory Visit (HOSPITAL_COMMUNITY): Payer: Self-pay

## 2023-08-02 ENCOUNTER — Encounter: Payer: Self-pay | Admitting: Cardiovascular Disease

## 2023-08-02 DIAGNOSIS — Z79899 Other long term (current) drug therapy: Secondary | ICD-10-CM

## 2023-08-02 MED ORDER — FUROSEMIDE 40 MG PO TABS: ORAL_TABLET | ORAL | Status: DC

## 2023-08-02 MED ORDER — ENTRESTO 49-51 MG PO TABS: 1.0000 | ORAL_TABLET | Freq: Two times a day (BID) | ORAL | 11 refills | Status: DC

## 2023-08-02 MED ORDER — DAPAGLIFLOZIN PROPANEDIOL 10 MG PO TABS: 10.0000 mg | ORAL_TABLET | Freq: Every day | ORAL | 11 refills | Status: DC

## 2023-08-02 MED ORDER — ENTRESTO 49-51 MG PO TABS: 1.0000 | ORAL_TABLET | Freq: Two times a day (BID) | ORAL | Status: DC

## 2023-08-02 MED ORDER — POTASSIUM CHLORIDE CRYS ER 10 MEQ PO TBCR: EXTENDED_RELEASE_TABLET | ORAL | Status: DC

## 2023-08-02 MED ORDER — DAPAGLIFLOZIN PROPANEDIOL 10 MG PO TABS: 10.0000 mg | ORAL_TABLET | Freq: Every day | ORAL | Status: DC

## 2023-08-02 NOTE — Addendum Note (Signed)
Addended by: Corey Harold T on: 08/02/2023 02:03 PM   Modules accepted: Orders

## 2023-08-02 NOTE — Telephone Encounter (Signed)
If we make the med changes now, we can check follow up labs and tweak them at the upcoming appointment

## 2023-08-02 NOTE — Progress Notes (Signed)
Reviewed with Dr. Royann Shivers, pumping function slightly lower than before, I will call patient around lunch time today to discuss echo result and addition of meds including switch candesartan to entrsto and add [place]. Please send referral to EP for upgrade to CRT per Dr. Royann Shivers

## 2023-08-12 ENCOUNTER — Other Ambulatory Visit: Payer: Self-pay | Admitting: Cardiovascular Disease

## 2023-08-26 ENCOUNTER — Encounter: Payer: Self-pay | Admitting: Cardiovascular Disease

## 2023-08-26 ENCOUNTER — Ambulatory Visit: Payer: Medicare Other | Attending: Cardiovascular Disease | Admitting: Cardiovascular Disease

## 2023-08-26 VITALS — BP 108/62 | HR 92 | Ht 62.0 in | Wt 159.4 lb

## 2023-08-26 DIAGNOSIS — I5022 Chronic systolic (congestive) heart failure: Secondary | ICD-10-CM

## 2023-08-26 DIAGNOSIS — Q221 Congenital pulmonary valve stenosis: Secondary | ICD-10-CM

## 2023-08-26 DIAGNOSIS — I5032 Chronic diastolic (congestive) heart failure: Secondary | ICD-10-CM

## 2023-08-26 DIAGNOSIS — I442 Atrioventricular block, complete: Secondary | ICD-10-CM

## 2023-08-26 DIAGNOSIS — I480 Paroxysmal atrial fibrillation: Secondary | ICD-10-CM

## 2023-08-26 DIAGNOSIS — Z862 Personal history of diseases of the blood and blood-forming organs and certain disorders involving the immune mechanism: Secondary | ICD-10-CM

## 2023-08-26 DIAGNOSIS — D6869 Other thrombophilia: Secondary | ICD-10-CM

## 2023-08-26 DIAGNOSIS — Z8774 Personal history of (corrected) congenital malformations of heart and circulatory system: Secondary | ICD-10-CM

## 2023-08-26 DIAGNOSIS — I281 Aneurysm of pulmonary artery: Secondary | ICD-10-CM

## 2023-08-26 DIAGNOSIS — Z950 Presence of cardiac pacemaker: Secondary | ICD-10-CM | POA: Diagnosis not present

## 2023-08-26 DIAGNOSIS — E7800 Pure hypercholesterolemia, unspecified: Secondary | ICD-10-CM

## 2023-08-26 MED ORDER — SPIRONOLACTONE 25 MG PO TABS: 12.5000 mg | ORAL_TABLET | Freq: Every day | ORAL | 3 refills | Status: DC

## 2023-08-26 NOTE — Patient Instructions (Signed)
Medication Instructions:  START SPIRONOLACTONE 12.5 MG DAILY STOP TAKING POTASSIUM  *If you need a refill on your cardiac medications before your next appointment, please call your pharmacy*  Testing/Procedures: Your physician has requested that you have an echocardiogram in [place]. Echocardiography is a painless test that uses sound waves to create images of your heart. It provides your doctor with information about the size and shape of your heart and how well your heart's chambers and valves are working. This procedure takes approximately one hour. There are no restrictions for this procedure. Please do NOT wear cologne, perfume, aftershave, or lotions (deodorant is allowed). Please arrive 15 minutes prior to your appointment time.    Follow-Up: At [facility], you and your health needs are our priority.  As part of our continuing mission to provide you with exceptional heart care, we have created designated Provider Care Teams.  These Care Teams include your primary Cardiologist (physician) and Advanced Practice Providers (APPs -  Physician Assistants and Nurse Practitioners) who all work together to provide you with the care you need, when you need it.  We recommend signing up for the patient portal called "MyChart".  Sign up information is provided on this After Visit Summary.  MyChart is used to connect with patients for Virtual Visits (Telemedicine).  Patients are able to view lab/test results, encounter notes, upcoming appointments, etc.  Non-urgent messages can be sent to your provider as well.   To learn more about what you can do with MyChart, go to [email or web address].    Your next appointment:   6 month(s)- Pacer check  Provider:   Thurmon Fair, MD

## 2023-08-26 NOTE — Progress Notes (Signed)
Patient ID: [name] Baird, female   DOB: [birth year], 89 y.o.   MRN: [id number]     Cardiology Office Note    Date:  08/26/2023   ID:  [name] Baird, DOB Oct 25, 1933, MRN [id number]  PCP:  Irven Coe, MD  Cardiologist:   Thurmon Fair, MD   No chief complaint on file.   History of Present Illness:  [name] Baird is a 89 y.o. female with a history of congenital heart disease (atrial septal defect status post patch closure), pulmonic stenosis (status post valvotomy 1985), left pulmonary artery aneurysm, complete heart block status post dual-chamber permanent pacemaker implantation (Medtronic, last generator change 2016) and infrequent paroxysmal atrial fibrillation (atrial flutter cavotricuspid isthmus ablation 2014). She has history of diastolic heart failure with infrequent exacerbation, most recently following an episode of lower GI bleeding with severe anemia in March 2021.  Xarelto was switched to Eliquis after that episode of bleeding.  Atypical atrial flutter has been a recurrent problem.  She had successful overdrive pacing of the arrhythmia in April 2023 with improvement in her symptoms of fatigue and reduced stamina.  We tried to do this again for a lengthy episode of atrial flutter in August 2023, unsuccessfully.  However, when she presented for cardioversion on September 27 she was back in normal rhythm spontaneously. She had DC CV for a prolonged 1 month episode on 12/25/2022. She had a single brief recurrence (1.5 hours on 12/27/2022), otherwise no events.  Because of the frequent PACs and recurrent atrial sustained arrhythmia atrial preference pacing is turned on and her heart rate histogram shows 2 peaks at 70 bpm and 90 bpm.  Otherwise pacemaker function is normal.  She has 84% atrial pacing and 99.9% ventricular pacing without any meaningful episodes of mode switch or high ventricular rates.  Lead parameters (original leads from 2007, not MRI conditional) and  generator voltage are within normal range (estimated device longevity about 2 years).  Most recent echocardiogram from 07/29/2023 showed decreased LVEF at 35-40%, unchanged valve problems (mild MR, moderate TR, moderate to severe TR, mild pulmonic stenosis).  She has not had any recent problems with worsening dyspnea, lower extremity edema, fatigue, palpitations, dizziness or syncope.  She has not had any falls or bleeding problems.  Her weight on her home scale is mostly around 158-159 pounds.  She has not required diuretic dose adjustment.    She is currently taking furosemide 40 mg daily as well as dapagliflozin 10 mg daily.  Heart failure medications also include Entresto in the intermediate dose and nebivolol 10 mg daily.  She is not yet on spironolactone.  Most recent potassium level was 5.2 but she is taking potassium supplements.  In May 2019 she saw Dr. Lowanda Foster Zwischenberger at [facility] to discuss treatment for pulmonary artery aneurysm.  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  Conservative management was recommended.  Echo performed there showed a left ventricular ejection fraction of 45-50%, severe left atrial enlargement, moderate regurgitation of all 4 cardiac valves, in addition to the dilated pulmonary artery and left pulmonary artery aneurysm.  She had atrial flutter ablation performed by Dr. Johney Frame in July 2014 and is not taking any antiarrhythmics at this time, other than a low dose of beta blocker. At the time of the EP study she was noted to have conventional counterclockwise isthmus-dependent right atrial flutter but then a second type of right atrial flutter was noted  briefly. It could not be reinduced for full study.  She has done poorly on conventional beta-blockers (atenolol, metoprolol), but tolerates Bystolic well.   Past Medical History:  Diagnosis Date   Acute GI bleeding 03/06/2020   Anxiety    Atrial  flutter (HCC)    s/p ablation 06/29/2013 by Dr Johney Frame   CHF (congestive heart failure) (HCC)    Complete heart block (HCC) 2007   s/p medtronic pacemaker implantation   Depression    Glaucoma    Hypertension    Pulmonary stenosis sp valvotomy        Shortness of breath    Status post patch closure of ASD     Past Surgical History:  Procedure Laterality Date   ABLATION  06/29/2013   s/p isthmus ablation for atrial flutter by Dr Johney Frame   AORTIC VALVE REPAIR     ATRIAL FLUTTER ABLATION N/A 06/29/2013   Procedure: ATRIAL FLUTTER ABLATION;  Surgeon: Hillis Range, MD;  Location: [facility] CATH LAB;  Service: Cardiovascular;  Laterality: N/A;   CARDIAC CATHETERIZATION N/A 06/27/2015   Procedure: Temporary Pacemaker;  Surgeon: Thurmon Fair, MD;  Location: [facility] INVASIVE CV LAB;  Service: Cardiovascular;  Laterality: N/A;   CARDIOVERSION N/A 03/24/2013   Procedure: CARDIOVERSION;  Surgeon: Thurmon Fair, MD;  Location: [facility] ENDOSCOPY;  Service: Cardiovascular;  Laterality: N/A;   CARDIOVERSION N/A 12/25/2022   Procedure: CARDIOVERSION;  Surgeon: Thurmon Fair, MD;  Location: [facility] ENDOSCOPY;  Service: Cardiovascular;  Laterality: N/A;   COLONOSCOPY WITH PROPOFOL Left 03/08/2020   Procedure: COLONOSCOPY WITH PROPOFOL;  Surgeon: Willis Modena, MD;  Location: [facility] ENDOSCOPY;  Service: Endoscopy;  Laterality: Left;   EP IMPLANTABLE DEVICE N/A 06/27/2015   Procedure: PPM Generator Changeout;  Surgeon: Thurmon Fair, MD;  Location: [facility] INVASIVE CV LAB;  Service: Cardiovascular;  Laterality: N/A;   ESOPHAGOGASTRODUODENOSCOPY (EGD) WITH PROPOFOL Left 03/07/2020   Procedure: ESOPHAGOGASTRODUODENOSCOPY (EGD) WITH PROPOFOL;  Surgeon: Willis Modena, MD;  Location: [facility] ENDOSCOPY;  Service: Endoscopy;  Laterality: Left;   PACEMAKER INSERTION  2007   Medtronic Adapta ADDRL1, serial # [id number]   pulmonary valvotomy  1985   trigeminal sx     left side    Outpatient Medications Prior to Visit  Medication Sig Dispense  Refill   acetaminophen (TYLENOL) 650 MG CR tablet Take 650 mg by mouth in the morning and at bedtime.     Calcium Carb-Cholecalciferol (CALCIUM 600/VITAMIN D3 PO) Take 1 tablet by mouth daily.     cholecalciferol (VITAMIN D3) 25 MCG (1000 UNIT) tablet Take 1,000 Units by mouth every other day.     clonazePAM (KLONOPIN) 0.5 MG tablet Take half tablet every other day as needed, for anxiety (Patient taking differently: Take 0.25 mg by mouth at bedtime. Take 1/4 tablet every day as needed, for anxiety) 15 tablet 0   dapagliflozin propanediol (FARXIGA) 10 MG TABS tablet Take 1 tablet (10 mg total) by mouth daily before breakfast. 30 tablet 11   dorzolamide-timolol (COSOPT) 22.3-6.8 MG/ML ophthalmic solution Place 1 drop into both eyes 2 (two) times daily.     erythromycin ophthalmic ointment Place 1 Application into the left eye in the morning and at bedtime.     famotidine (PEPCID) 20 MG tablet Take 20 mg by mouth daily.     furosemide (LASIX) 40 MG tablet TAKE 1 TABLET BY MOUTH DAILY     latanoprost (XALATAN) 0.005 % ophthalmic solution Place 1 drop into both eyes at bedtime.     Multiple Vitamins-Minerals (MULTIVITAMIN PO) Take 1  tablet by mouth daily.      nebivolol (BYSTOLIC) 10 MG tablet TAKE 1 TABLET BY MOUTH EVERY DAY 90 tablet 3   nystatin (MYCOSTATIN/NYSTOP) powder Apply 1 Application topically daily as needed (irritation).     polyvinyl alcohol (ARTIFICIAL TEARS) 1.4 % ophthalmic solution Place 1 drop into both eyes as needed for dry eyes.     RHOPRESSA 0.02 % SOLN Place 1 drop into the right eye every other day.     rosuvastatin (CRESTOR) 10 MG tablet TAKE 1 TABLET BY MOUTH EVERYDAY AT BEDTIME 90 tablet 1   sacubitril-valsartan (ENTRESTO) 49-51 MG Take 1 tablet by mouth 2 (two) times daily. 60 tablet 11   traMADol (ULTRAM) 50 MG tablet Take 50 mg by mouth daily as needed for severe pain.     UNABLE TO FIND Med Name: Heel protector boot for right ankle Apply at bedtime to promote healing of  your pressure ulcer. 1 each 0   venlafaxine XR (EFFEXOR-XR) 75 MG 24 hr capsule TAKE 1 CAPSULE BY MOUTH EVERY DAY (Patient taking differently: Take 75 mg by mouth at bedtime. TAKE 1 CAPSULE BY MOUTH EVERY DAY) 90 capsule 1   White Petrolatum-Mineral Oil (GENTEAL TEARS NIGHT-TIME) OINT Place 1 application  into the right eye at bedtime.     XARELTO 15 MG TABS tablet TAKE 1 TABLET (15 MG TOTAL) BY MOUTH DAILY WITH SUPPER 90 tablet 1   potassium chloride (KLOR-CON M10) 10 MEQ tablet TAKE 2 TABLETS BY MOUTH DAILY     dapagliflozin propanediol (FARXIGA) 10 MG TABS tablet Take 1 tablet (10 mg total) by mouth daily before breakfast.     sacubitril-valsartan (ENTRESTO) 49-51 MG Take 1 tablet by mouth 2 (two) times daily.     No facility-administered medications prior to visit.     Allergies:   Sulfa antibiotics and Hydrocodone   Social History   Socioeconomic History   Marital status: Widowed    Spouse name: Not on file   Number of children: 2   Years of education: Not on file   Highest education level: Not on file  Occupational History   Occupation: retired/homemaker  Tobacco Use   Smoking status: Never   Smokeless tobacco: Never  Vaping Use   Vaping status: Never Used  Substance and Sexual Activity   Alcohol use: No   Drug use: No   Sexual activity: Never  Other Topics Concern   Not on file  Social History Narrative   Not on file   Social Determinants of Health   Financial Resource Strain: Low Risk  (05/12/2021)   Overall Financial Resource Strain (CARDIA)    Difficulty of Paying Living Expenses: Not hard at all  Food Insecurity: No Food Insecurity (05/12/2021)   Hunger Vital Sign    Worried About Running Out of Food in the Last Year: Never true    Ran Out of Food in the Last Year: Never true  Transportation Needs: No Transportation Needs (05/12/2021)   PRAPARE - Administrator, Civil Service (Medical): No    Lack of Transportation (Non-Medical): No  Physical  Activity: Insufficiently Active (05/12/2021)   Exercise Vital Sign    Days of Exercise per Week: 3 days    Minutes of Exercise per Session: 30 min  Stress: No Stress Concern Present (05/12/2021)   [employer] of Occupational Health - Occupational Stress Questionnaire    Feeling of Stress : Not at all  Social Connections: Moderately Isolated (05/12/2021)   Social Connection and  Isolation Panel [NHANES]    Frequency of Communication with Friends and Family: More than three times a week    Frequency of Social Gatherings with Friends and Family: More than three times a week    Attends Religious Services: More than 4 times per year    Active Member of [name] or Organizations: No    Attends Banker Meetings: Never    Marital Status: Widowed    ROS:   Please see the history of present illness.    All other systems are reviewed and are negative.    PHYSICAL EXAM:   VS:  BP 108/62 (BP Location: Left Arm, Patient Position: Sitting)   Pulse 92   Ht 5\' 2"  (1.575 m)   Wt 159 lb 6.4 oz (72.3 kg)   SpO2 95%   BMI 29.15 kg/m      General: Alert, oriented x3, no distress, healthy R subclavian PM site Head: no evidence of trauma, PERRL, EOMI, no exophtalmos or lid lag, no myxedema, no xanthelasma; normal ears, nose and oropharynx Neck: normal jugular venous pulsations and no hepatojugular reflux; brisk carotid pulses without delay and no carotid bruits Chest: clear to auscultation, no signs of consolidation by percussion or palpation, normal fremitus, symmetrical and full respiratory excursions Cardiovascular: normal position and quality of the apical impulse, regular rhythm, normal first and second heart sounds, 2/6 systolic and 1/6 diastolic RUSB murmurs, no apical murmurs, rubs or gallops Abdomen: no tenderness or distention, no masses by palpation, no abnormal pulsatility or arterial bruits, normal bowel sounds, no hepatosplenomegaly Extremities: no clubbing, cyanosis or edema;  2+ radial, ulnar and brachial pulses bilaterally; 2+ right femoral, posterior tibial and dorsalis pedis pulses; 2+ left femoral, posterior tibial and dorsalis pedis pulses; no subclavian or femoral bruits Neurological: grossly nonfocal Psych: Normal mood and affect  Wt Readings from Last 3 Encounters:  08/26/23 159 lb 6.4 oz (72.3 kg)  07/05/23 160 lb (72.6 kg)  03/10/23 158 lb 12.8 oz (72 kg)    Studies/Labs Reviewed:   ECHO 10/24/2020:   1. Hypokinesisi of the inferolateral wall, teh distal inferior wall;  akinesis of the distal septum and apex.. Left ventricular ejection  fraction, by estimation, is 35 to 40%. The left ventricle has moderately  decreased function.   2. Right ventricular systolic function is low normal. The right  ventricular size is mildly enlarged.   3. Left atrial size was mildly dilated.   4. Right atrial size was mild to moderately dilated.   5. Mild mitral valve regurgitation.   6. Tricuspid valve regurgitation is moderate.   7. The aortic valve is tricuspid. Aortic valve regurgitation is moderate.  Aortic valve sclerosis is present, with no evidence of aortic valve  stenosis.   8. Pulmonic valve regurgitation is moderate to severe. Mild pulmonic  stenosis.   9. Peak and mean gradients through the valve are 10 and 6 mm HG  respectively.. Moderately dilated pulmonary artery.  10. The inferior vena cava is normal in size with greater than 50%  respiratory variability, suggesting right atrial pressure of 3 mmHg.   Comparison(s): The left ventricular function is worsened.   EKG:  EKG is not ordered today. The intracardiac EGM shows AV sequential pacing at 70 bpm.  There is no underlying atrial or ventricular native rhythm.  Reviewed the ECG from 07/05/2023 which shows AV sequential pacing with a rather broad QRS but a distinct positive R wave in lead V1.  Lipid Panel  Component Value Date/Time   CHOL 138 10/07/2021 0817   TRIG 144 10/07/2021 0817    HDL 53 10/07/2021 0817   CHOLHDL 2.2 06/03/2020 0918   LDLCALC 60 10/07/2021 0817   BMET    Component Value Date/Time   NA 141 08/13/2023 1542   K 5.2 08/13/2023 1542   K 4.2 09/13/2013 1609   CL 102 08/13/2023 1542   CO2 25 08/13/2023 1542   GLUCOSE 116 (H) 08/13/2023 1542   GLUCOSE 119 (H) 07/11/2022 1700   BUN 20 08/13/2023 1542   CREATININE 1.27 (H) 08/13/2023 1542   CREATININE 1.03 06/25/2015 1012   CALCIUM 9.6 08/13/2023 1542   GFRNONAA 39 (L) 07/11/2022 1700   GFRAA 51 (L) 10/03/2020 1029   04/07/2022 Cholesterol 128, HDL 51, LDL 50, triglycerides 159 Hemoglobin 13.2, creatinine 1.13, potassium 4.3, ALT 10  10/07/2021 Hemoglobin A1c 6.1%  ASSESSMENT:    1. Chronic diastolic heart failure (HCC)   2. Paroxysmal atrial fibrillation (HCC)   3. CHB (complete heart block) (HCC)   4. Pacemaker   5. Acquired thrombophilia (HCC)   6. Hypercholesterolemia   7. Pulmonic stenosis, congenital   8. History of repair of congenital atrial septal defect (ASD)   9. Aneurysm of left pulmonary artery (HCC)   10. History of iron deficiency anemia   11. Chronic systolic heart failure (HCC)        PLAN:  In order of problems listed above:  CHF, combined systolic and diastolic: For many years she had borderline reduced LVEF attributable to RV pacing, but just this year she has developed systolic dysfunction with an EF down to 35-40%.  Etiology is unclear.  The echo was interpreted as showing inferior wall motion abnormality, but on my review I simply think that there is inferoseptal dyssynchrony related to pacing.  She had normal coronary arteries a previous cardiac catheterization and had a normal nuclear stress test in the past.  She has never had angina pectoris.  Doubt that she has coronary disease.  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  Will add spironolactone today and stop her potassium supplement.  Recheck labs in about a month and recheck echocardiogram in  a couple of months.  Option for referral to CRT if LVEF is severely depressed.   PAFlutter/ Afib: Successful cardioversion in early January 2024 with only a single 90 minute breakthrough event early on, none recently.  CHADSVasc4 (age 56, gender, HF).  On appropriate anticoagulation CHB: Pacemaker dependent.  No underlying escape rhythm and she feels very poorly when we performed ventricular threshold testing.   PPM: Leads are from 2007 and still have normal parameters.  Current generator is from 2016 (Medtronic Adapta) and anticipate need for generator change out in about 2 years or so.  Continue remote downloads every 3 months.  She has frequent atrial preference pacing at 90 bpm but this seems to be helping to prevent recurrent atypical atrial flutter. Anticoagulation: Dose adjusted for age and renal function.  Has not had any bleeding complications in over 3 years now. HLP: All lipid parameters are in the desirable range.  No known CAD or PAD. PS s/p surgical valvotomy: Her murmurs appear unchanged.  Moderate regurgitation, no residual stenosis by echocardiogram.  No clinical signs of right heart failure today. ASD s/p patch closure: No evidence of residual shunt on multiple echoes. L PA aneurysm: 6.4 cm.  Plan conservative, non-surgical management.  No intention to pursue surgical repair.  Serial imaging not  indicated. Anemia: She is off iron supplements for a while now and has not had anemia recurrence while on Eliquis.  Shared Decision Making/Informed Consent The risks (stroke, cardiac arrhythmias rarely resulting in the need for a temporary or permanent pacemaker, skin irritation or burns and complications associated with conscious sedation including aspiration, arrhythmia, respiratory failure and death), benefits (restoration of normal sinus rhythm) and alternatives of a direct current cardioversion were explained in detail to Ms. Governale and she agrees to proceed.      Medication  Adjustments/Labs and Tests Ordered: Current medicines are reviewed at length with the patient today.  Concerns regarding medicines are outlined above.  Medication changes, Labs and Tests ordered today are listed in the Patient Instructions below. Patient Instructions  Medication Instructions:  START SPIRONOLACTONE 12.5 MG DAILY STOP TAKING POTASSIUM  *If you need a refill on your cardiac medications before your next appointment, please call your pharmacy*  Testing/Procedures: Your physician has requested that you have an echocardiogram in [place]. Echocardiography is a painless test that uses sound waves to create images of your heart. It provides your doctor with information about the size and shape of your heart and how well your heart's chambers and valves are working. This procedure takes approximately one hour. There are no restrictions for this procedure. Please do NOT wear cologne, perfume, aftershave, or lotions (deodorant is allowed). Please arrive 15 minutes prior to your appointment time.    Follow-Up: At [facility], you and your health needs are our priority.  As part of our continuing mission to provide you with exceptional heart care, we have created designated Provider Care Teams.  These Care Teams include your primary Cardiologist (physician) and Advanced Practice Providers (APPs -  Physician Assistants and Nurse Practitioners) who all work together to provide you with the care you need, when you need it.  We recommend signing up for the patient portal called "MyChart".  Sign up information is provided on this After Visit Summary.  MyChart is used to connect with patients for Virtual Visits (Telemedicine).  Patients are able to view lab/test results, encounter notes, upcoming appointments, etc.  Non-urgent messages can be sent to your provider as well.   To learn more about what you can do with MyChart, go to [email or web address].    Your next appointment:   6  month(s)- Pacer check  Provider:   Thurmon Fair, MD       Signed, Thurmon Fair, MD  08/26/2023 5:40 PM    [facility] [address] [place], [place], [place]  [address] Phone: [phone]; Fax: [phone]

## 2023-08-30 ENCOUNTER — Ambulatory Visit (INDEPENDENT_AMBULATORY_CARE_PROVIDER_SITE_OTHER): Payer: Medicare Other

## 2023-08-30 ENCOUNTER — Encounter: Payer: Self-pay | Admitting: Cardiovascular Disease

## 2023-08-30 DIAGNOSIS — I480 Paroxysmal atrial fibrillation: Secondary | ICD-10-CM

## 2023-08-30 DIAGNOSIS — I442 Atrioventricular block, complete: Secondary | ICD-10-CM

## 2023-08-30 LAB — CUP PACEART REMOTE DEVICE CHECK
Battery Impedance: 1893 Ohm
Battery Remaining Longevity: 30 mo
Battery Voltage: 2.73 V
Brady Statistic AP VP Percent: 80 %
Brady Statistic AP VS Percent: 0 %
Brady Statistic AS VP Percent: 20 %
Brady Statistic AS VS Percent: 0 %
Date Time Interrogation Session: 20240909065826
Implantable Lead Connection Status: 753985
Implantable Lead Connection Status: 753985
Implantable Lead Implant Date: 20070920
Implantable Lead Implant Date: 20070920
Implantable Lead Location: 753859
Implantable Lead Location: 753860
Implantable Lead Model: 4592
Implantable Lead Model: 5092
Implantable Pulse Generator Implant Date: 20160707
Lead Channel Impedance Value: 512 Ohm
Lead Channel Impedance Value: 818 Ohm
Lead Channel Pacing Threshold Amplitude: 0.75 V
Lead Channel Pacing Threshold Amplitude: 1.375 V
Lead Channel Pacing Threshold Pulse Width: 0.4 ms
Lead Channel Pacing Threshold Pulse Width: 0.4 ms
Lead Channel Setting Pacing Amplitude: 2 V
Lead Channel Setting Pacing Amplitude: 2.75 V
Lead Channel Setting Pacing Pulse Width: 0.4 ms
Lead Channel Setting Sensing Sensitivity: 5.6 mV
Zone Setting Status: 755011
Zone Setting Status: 755011

## 2023-09-09 NOTE — Progress Notes (Signed)
Remote pacemaker transmission.   

## 2023-10-26 ENCOUNTER — Ambulatory Visit (HOSPITAL_COMMUNITY): Payer: Medicare Other | Attending: Cardiovascular Disease

## 2023-10-26 ENCOUNTER — Encounter: Payer: Self-pay | Admitting: Cardiovascular Disease

## 2023-10-26 DIAGNOSIS — I5022 Chronic systolic (congestive) heart failure: Secondary | ICD-10-CM | POA: Diagnosis present

## 2023-10-26 LAB — ECHOCARDIOGRAM COMPLETE
Area-P 1/2: 3.9 cm2
P 1/2 time: 465 ms
S' Lateral: 3.1 cm

## 2023-11-06 ENCOUNTER — Other Ambulatory Visit: Payer: Self-pay | Admitting: Cardiovascular Disease

## 2023-11-08 NOTE — Telephone Encounter (Signed)
Prescription refill request for Xarelto received.  Indication:afib Last office visit:9/24 Weight:72.3  kg Age:89 Scr:1.27  8/24 CrCl:34.95  ml/min  Prescription refilled

## 2023-11-12 ENCOUNTER — Other Ambulatory Visit: Payer: Self-pay | Admitting: Cardiovascular Disease

## 2023-11-12 ENCOUNTER — Other Ambulatory Visit: Payer: Self-pay

## 2023-11-29 ENCOUNTER — Ambulatory Visit (INDEPENDENT_AMBULATORY_CARE_PROVIDER_SITE_OTHER): Payer: Medicare Other

## 2023-11-29 DIAGNOSIS — I442 Atrioventricular block, complete: Secondary | ICD-10-CM

## 2023-11-29 DIAGNOSIS — I480 Paroxysmal atrial fibrillation: Secondary | ICD-10-CM

## 2023-11-29 LAB — CUP PACEART REMOTE DEVICE CHECK
Battery Impedance: 1924 Ohm
Battery Remaining Longevity: 29 mo
Battery Voltage: 2.73 V
Brady Statistic AP VP Percent: 73 %
Brady Statistic AP VS Percent: 0 %
Brady Statistic AS VP Percent: 27 %
Brady Statistic AS VS Percent: 0 %
Date Time Interrogation Session: 20241209070149
Implantable Lead Connection Status: 753985
Implantable Lead Connection Status: 753985
Implantable Lead Implant Date: 20070920
Implantable Lead Implant Date: 20070920
Implantable Lead Location: 753859
Implantable Lead Location: 753860
Implantable Lead Model: 4592
Implantable Lead Model: 5092
Implantable Pulse Generator Implant Date: 20160707
Lead Channel Impedance Value: 530 Ohm
Lead Channel Impedance Value: 745 Ohm
Lead Channel Pacing Threshold Amplitude: 0.625 V
Lead Channel Pacing Threshold Amplitude: 1.375 V
Lead Channel Pacing Threshold Pulse Width: 0.4 ms
Lead Channel Pacing Threshold Pulse Width: 0.4 ms
Lead Channel Setting Pacing Amplitude: 2 V
Lead Channel Setting Pacing Amplitude: 2.75 V
Lead Channel Setting Pacing Pulse Width: 0.4 ms
Lead Channel Setting Sensing Sensitivity: 5.6 mV
Zone Setting Status: 755011
Zone Setting Status: 755011

## 2023-11-30 ENCOUNTER — Encounter: Payer: Self-pay | Admitting: Cardiovascular Disease

## 2024-01-11 ENCOUNTER — Emergency Department (HOSPITAL_BASED_OUTPATIENT_CLINIC_OR_DEPARTMENT_OTHER): Payer: Medicare Other | Admitting: Radiology

## 2024-01-11 ENCOUNTER — Emergency Department (HOSPITAL_BASED_OUTPATIENT_CLINIC_OR_DEPARTMENT_OTHER)
Admission: EM | Admit: 2024-01-11 | Discharge: 2024-01-11 | Disposition: A | Discharge status: Home / Self Care | Payer: Medicare Other | Attending: Emergency Medicine | Admitting: Emergency Medicine

## 2024-01-11 ENCOUNTER — Emergency Department (HOSPITAL_BASED_OUTPATIENT_CLINIC_OR_DEPARTMENT_OTHER): Payer: Medicare Other

## 2024-01-11 ENCOUNTER — Other Ambulatory Visit: Payer: Self-pay

## 2024-01-11 DIAGNOSIS — Z79899 Other long term (current) drug therapy: Secondary | ICD-10-CM | POA: Diagnosis not present

## 2024-01-11 DIAGNOSIS — I509 Heart failure, unspecified: Secondary | ICD-10-CM | POA: Insufficient documentation

## 2024-01-11 DIAGNOSIS — Y9301 Activity, walking, marching and hiking: Secondary | ICD-10-CM | POA: Diagnosis not present

## 2024-01-11 DIAGNOSIS — W0110XA Fall on same level from slipping, tripping and stumbling with subsequent striking against unspecified object, initial encounter: Secondary | ICD-10-CM | POA: Diagnosis not present

## 2024-01-11 DIAGNOSIS — I110 Hypertensive heart disease with heart failure: Secondary | ICD-10-CM | POA: Diagnosis not present

## 2024-01-11 DIAGNOSIS — S0003XA Contusion of scalp, initial encounter: Secondary | ICD-10-CM | POA: Insufficient documentation

## 2024-01-11 DIAGNOSIS — W19XXXA Unspecified fall, initial encounter: Secondary | ICD-10-CM

## 2024-01-11 DIAGNOSIS — M25511 Pain in right shoulder: Secondary | ICD-10-CM | POA: Diagnosis present

## 2024-01-11 DIAGNOSIS — Z7901 Long term (current) use of anticoagulants: Secondary | ICD-10-CM | POA: Diagnosis not present

## 2024-01-11 DIAGNOSIS — S0990XA Unspecified injury of head, initial encounter: Secondary | ICD-10-CM

## 2024-01-11 DIAGNOSIS — S40011A Contusion of right shoulder, initial encounter: Secondary | ICD-10-CM | POA: Diagnosis not present

## 2024-01-11 NOTE — ED Triage Notes (Addendum)
Pt caox4 in wheelchair arriving with family reporting mechanical fall with walker at approx 0730 causing her to fall back and hit her head. Hematoma to back of head. Denies LOC. Denies numbness/weakness/tingling to extremities. Also reports she had pain in R upper arm/shoulder prior to fall but pain has been exacerbated since fall. Pt takes Xarelto.

## 2024-01-11 NOTE — ED Provider Notes (Signed)
[place] EMERGENCY DEPARTMENT AT [facility] Provider Note   CSN: [id number] Arrival date & time: 01/11/24  4098     History  Chief Complaint  Patient presents with   [facility] [name] Baird is a 89 y.o. female.  Patient is a 89 year old female with a history of hypertension, atrial flutter, CHF, pulmonary stenosis who presents after a fall.  She states she was walking with her walker this morning and made a turn and fell over onto her back.  She hit the back of her head and has a hematoma to her head.  She denies any loss of consciousness.  This happened this morning.  She did not have any symptoms preceding the fall.  No dizziness, chest pain, palpitations, shortness of breath or other symptoms preceding the fall.  She complains of pain to her head and her right shoulder.  She has some ongoing issues with her right shoulder but says it hurts little worse since the fall.  She denies any other complaints of pain.  No nausea or vomiting.  Family is at bedside who also offers history.       Home Medications Prior to Admission medications   Medication Sig Start Date End Date Taking? Authorizing Provider  acetaminophen (TYLENOL) 650 MG CR tablet Take 650 mg by mouth in the morning and at bedtime.    [provider]  Calcium Carb-Cholecalciferol (CALCIUM 600/VITAMIN D3 PO) Take 1 tablet by mouth daily.    [provider]  cholecalciferol (VITAMIN D3) 25 MCG (1000 UNIT) tablet Take 1,000 Units by mouth every other day.    [provider]  clonazePAM (KLONOPIN) 0.5 MG tablet Take half tablet every other day as needed, for anxiety Patient taking differently: Take 0.25 mg by mouth at bedtime. Take 1/4 tablet every day as needed, for anxiety 03/06/22   Donell Beers, FNP  dapagliflozin propanediol (FARXIGA) 10 MG TABS tablet Take 1 tablet (10 mg total) by mouth daily before breakfast. 08/02/23   Azalee Course, PA  dorzolamide-timolol (COSOPT) 22.3-6.8  MG/ML ophthalmic solution Place 1 drop into both eyes 2 (two) times daily.    [provider]  erythromycin ophthalmic ointment Place 1 Application into the left eye in the morning and at bedtime. 01/29/22   [provider]  famotidine (PEPCID) 20 MG tablet Take 20 mg by mouth daily.    [provider]  furosemide (LASIX) 40 MG tablet TAKE 1 TABLET BY MOUTH TWICE A DAY 11/12/23   Croitoru, Mihai, MD  latanoprost (XALATAN) 0.005 % ophthalmic solution Place 1 drop into both eyes at bedtime.    [provider]  Multiple Vitamins-Minerals (MULTIVITAMIN PO) Take 1 tablet by mouth daily.     [provider]  nebivolol (BYSTOLIC) 10 MG tablet TAKE 1 TABLET BY MOUTH EVERY DAY 08/12/23   Croitoru, Mihai, MD  nystatin (MYCOSTATIN/NYSTOP) powder Apply 1 Application topically daily as needed (irritation). 10/06/22   [provider]  polyvinyl alcohol (ARTIFICIAL TEARS) 1.4 % ophthalmic solution Place 1 drop into both eyes as needed for dry eyes.    [provider]  RHOPRESSA 0.02 % SOLN Place 1 drop into the right eye every other day. 09/13/20   [provider]  rosuvastatin (CRESTOR) 10 MG tablet TAKE 1 TABLET BY MOUTH EVERYDAY AT BEDTIME 02/24/22   Paseda, Folashade R, FNP  sacubitril-valsartan (ENTRESTO) 49-51 MG Take 1 tablet by mouth 2 (two) times daily. 08/02/23   Azalee Course, PA  spironolactone (  ALDACTONE) 25 MG tablet Take 0.5 tablets (12.5 mg total) by mouth daily. 08/26/23   Croitoru, Mihai, MD  traMADol (ULTRAM) 50 MG tablet Take 50 mg by mouth daily as needed for severe pain.    [provider]  UNABLE TO FIND Med Name: Heel protector boot for right ankle Apply at bedtime to promote healing of your pressure ulcer. 03/07/21   Heather Roberts, NP  venlafaxine XR (EFFEXOR-XR) 75 MG 24 hr capsule TAKE 1 CAPSULE BY MOUTH EVERY DAY Patient taking differently: Take 75 mg by mouth at bedtime. TAKE 1 CAPSULE BY MOUTH EVERY DAY 09/09/22    Paseda, Baird Kay, FNP  White Petrolatum-Mineral Oil (GENTEAL TEARS NIGHT-TIME) OINT Place 1 application  into the right eye at bedtime.    [provider]  XARELTO 15 MG TABS tablet TAKE 1 TABLET (15 MG TOTAL) BY MOUTH DAILY WITH SUPPER 11/08/23   Croitoru, Mihai, MD      Allergies    Sulfa antibiotics and Hydrocodone    Review of Systems   Review of Systems  Constitutional:  Negative for chills, diaphoresis, fatigue and fever.  HENT:  Negative for congestion, rhinorrhea and sneezing.   Eyes: Negative.   Respiratory:  Negative for cough, chest tightness and shortness of breath.   Cardiovascular:  Negative for chest pain and leg swelling.  Gastrointestinal:  Negative for abdominal pain, diarrhea, nausea and vomiting.  Genitourinary:  Negative for difficulty urinating, flank pain and frequency.  Musculoskeletal:  Positive for arthralgias. Negative for back pain.  Skin:  Negative for rash.  Neurological:  Positive for headaches. Negative for dizziness, speech difficulty, weakness and numbness.    Physical Exam Updated Vital Signs BP 112/84 (BP Location: Left Arm)   Pulse 75   Temp 98.1 F (36.7 C) (Oral)   Resp 16   Ht 5\' 2"  (1.575 m)   Wt 73.8 kg   SpO2 100%   BMI 29.76 kg/m  Physical Exam Constitutional:      Appearance: She is well-developed.  HENT:     Head: Normocephalic.     Comments: Small hematoma to the posterior scalp, no overlying wounds Eyes:     Pupils: Pupils are equal, round, and reactive to light.  Neck:     Comments: No pain to the cervical, thoracic or lumbosacral spine, no step-offs or deformities Cardiovascular:     Rate and Rhythm: Normal rate and regular rhythm.     Heart sounds: Normal heart sounds.  Pulmonary:     Effort: Pulmonary effort is normal. No respiratory distress.     Breath sounds: Normal breath sounds. No wheezing or rales.  Chest:     Chest wall: No tenderness.  Abdominal:     General: Bowel sounds are normal.      Palpations: Abdomen is soft.     Tenderness: There is no abdominal tenderness. There is no guarding or rebound.  Musculoskeletal:        General: Normal range of motion.     Comments: Positive tenderness on range of motion of the right shoulder, no crepitus or deformity noted.  No pain to the elbow or wrist.  She has normal sensation and motor function in the hand.  Radial pulses intact.  No other pain on palpation or range of motion of the other extremities including the hips.  Lymphadenopathy:     Cervical: No cervical adenopathy.  Skin:    General: Skin is warm and dry.     Findings: No rash.  Neurological:     Mental Status: She is alert and oriented to person, place, and time.     ED Results / Procedures / Treatments   Labs (all labs ordered are listed, but only abnormal results are displayed) Labs Reviewed - No data to display  EKG None  Radiology DG Shoulder Right Result Date: 01/11/2024 CLINICAL DATA:  Fall and trauma to the right shoulder.  Pain. EXAM: RIGHT SHOULDER - 2+ VIEW COMPARISON:  None Available. FINDINGS: There is no acute fracture or dislocation. Evaluation for fracture is however limited due to advanced osteopenia. There is irregularity of the medial cortex of the humeral neck as well as faint area of sclerosis, likely chronic and sequela of prior fracture. If there is a high clinical concern for an acute fracture, further evaluation with CT or MRI is recommended. There is no dislocation. There is arthritic changes with severe narrowing of the glenohumeral joint space. The soft tissues are unremarkable. IMPRESSION: 1. No obvious acute fracture or dislocation. 2. Advanced osteopenia. 3. 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 Electronically Signed   By: Elgie Collard M.D.   On: 01/11/2024 10:33   CT Head Wo Contrast Result Date: 01/11/2024 CLINICAL DATA:  Larey Seat with trauma to the head and neck EXAM: CT HEAD WITHOUT CONTRAST TECHNIQUE: Contiguous  axial images were obtained from the base of the skull through the vertex without intravenous contrast. RADIATION DOSE REDUCTION: This exam was performed according to the departmental dose-optimization program which includes automated exposure control, adjustment of the mA and/or kV according to patient size and/or use of iterative reconstruction technique. COMPARISON:  09/07/2022 FINDINGS: Brain: No acute finding. Previous left occipital craniotomy and cranioplasty. No visible abnormality of the underlying cerebellum. Cerebral hemispheres show atrophy with chronic small-vessel ischemic changes of the white matter. No evidence of acute stroke, mass, hemorrhage, hydrocephalus or extra-axial collection. Vascular: There is atherosclerotic calcification of the major vessels at the base of the brain. Skull: No acute calvarial finding. Sinuses/Orbits: Opacification of the right division of the sphenoid sinus, now with bulging into the left division. Suspicion of sphenoid sinus mucocele. Non emergent ENT consultation suggested. Other: None IMPRESSION: 1. No acute or traumatic finding. Previous left occipital craniotomy and cranioplasty. Atrophy and chronic small-vessel ischemic changes of the white matter. 2. Opacification of the right division of the sphenoid sinus, now with bulging into the left division. Suspicion of sphenoid sinus mucocele. Non emergent ENT consultation suggested. Electronically Signed   By: Paulina Fusi M.D.   On: 01/11/2024 09:57   CT Cervical Spine Wo Contrast Result Date: 01/11/2024 CLINICAL DATA:  Larey Seat.  Trauma to the head and neck. EXAM: CT CERVICAL SPINE WITHOUT CONTRAST TECHNIQUE: Multidetector CT imaging of the cervical spine was performed without intravenous contrast. Multiplanar CT image reconstructions were also generated. RADIATION DOSE REDUCTION: This exam was performed according to the departmental dose-optimization program which includes automated exposure control, adjustment of the  mA and/or kV according to patient size and/or use of iterative reconstruction technique. COMPARISON:  09/07/2022 FINDINGS: Alignment: Chronic cervicothoracic kyphosis. No traumatic malalignment. Skull base and vertebrae: No regional fracture or focal bone lesion. Soft tissues and spinal canal: No traumatic soft tissue finding. Disc levels: Ordinary degenerative spondylosis with disc space narrowing. Mild facet osteoarthritis. Fusion of the facets on the right at C4-5. No apparent compressive stenosis of the canal or foramina. Upper chest: Mild apical scarring. Other: None IMPRESSION: No acute or traumatic finding. Ordinary degenerative spondylosis. Chronic cervicothoracic kyphosis. Electronically Signed  By: Paulina Fusi M.D.   On: 01/11/2024 09:54    Procedures Procedures    Medications Ordered in ED Medications - No data to display  ED Course/ Medical Decision Making/ A&P                                 Medical Decision Making Amount and/or Complexity of Data Reviewed Radiology: ordered.   Patient is a 89 year old who presents after a mechanical fall.  She has a hematoma to the posterior scalp.  She had some pain in her right shoulder.  No other apparent injuries.  She had a CT scan of her head and cervical spine.  There is no traumatic injuries identified.  No intracranial hemorrhage.  No cervical spine fracture.  There was some inflammation in her sphenoid sinus and a possibly mucocele.  No other acute abnormalities.  X-rays of her right shoulder were interpreted by me confirmed by the radiologist to show no fractures.  She is at her baseline mental status.  No other apparent injuries.  Discussed with family holding her current dose of Xarelto and restarting her dose tomorrow.  She was discharged home in good condition.  Will give her referral to follow-up with ENT regarding the possible mucocele.  This was discussed with patient and her family at bedside.  She was given head injury  precautions and return precautions.  Final Clinical Impression(s) / ED Diagnoses Final diagnoses:  Fall, initial encounter  Injury of head, initial encounter  Contusion of right shoulder, initial encounter    Rx / DC Orders ED Discharge Orders     None         Rolan Bucco, MD 01/11/24 1113

## 2024-01-11 NOTE — ED Notes (Signed)
Discharge paperwork given and verbally understood. 

## 2024-01-18 ENCOUNTER — Encounter: Payer: Self-pay | Admitting: Cardiovascular Disease

## 2024-01-18 NOTE — Telephone Encounter (Signed)
Spoke with pt daughter [place]. [name] verified pt name and DOB. Daughter stated they did a pacer download around 7:20 this morning. I have looked in patient's chart and do not see the device check results. I informed daughter I would forward msg to device team.  I asked daughter about symptoms patient has been feeling. Daughter stated patient has been fatigued and short of breath without exertion. Patient's weight is usually 157 lbs. It was 158.5 lbs this morning. Pt has taken an extra diuretic due to extra weight. Blood pressure has ranged in the 130s/80s. Patient keeps a log of BP. I advised daughter if symptoms of sob get worse, to go to ER for evaluation. Patient has a follow up appointment March 02, 2024 with Dr. Royann Shivers. Please advise.  Josie LPN

## 2024-01-18 NOTE — Telephone Encounter (Signed)
Thank you!  Agree with the advice that you gave to the patient and her family.

## 2024-01-18 NOTE — Telephone Encounter (Signed)
Spoke to daughter Lanice Schwab, who advised patient fell several days ago and now having increased fatigue and shortness of breath. Remote transmission reviewed which shows no associated arrhythmias in relation to fall or past few days. Patients presenting rhythm is NSR. Recommended patient follow up with PCP to assess further. Voiced understanding and appreciative of call.

## 2024-02-14 ENCOUNTER — Encounter: Payer: Self-pay | Admitting: Cardiovascular Disease

## 2024-02-14 NOTE — Telephone Encounter (Signed)
 She is back in atrial flutter.  Can you see if she can please come into the office to see me tomorrow at 10 AM?

## 2024-02-15 ENCOUNTER — Ambulatory Visit: Payer: Medicare Other | Attending: Cardiovascular Disease | Admitting: Cardiovascular Disease

## 2024-02-15 ENCOUNTER — Encounter: Payer: Self-pay | Admitting: Cardiovascular Disease

## 2024-02-15 VITALS — BP 106/62 | HR 77 | Wt 157.0 lb

## 2024-02-15 DIAGNOSIS — I442 Atrioventricular block, complete: Secondary | ICD-10-CM

## 2024-02-15 DIAGNOSIS — D6869 Other thrombophilia: Secondary | ICD-10-CM

## 2024-02-15 DIAGNOSIS — Z950 Presence of cardiac pacemaker: Secondary | ICD-10-CM

## 2024-02-15 DIAGNOSIS — Z8774 Personal history of (corrected) congenital malformations of heart and circulatory system: Secondary | ICD-10-CM

## 2024-02-15 DIAGNOSIS — I281 Aneurysm of pulmonary artery: Secondary | ICD-10-CM

## 2024-02-15 DIAGNOSIS — Z862 Personal history of diseases of the blood and blood-forming organs and certain disorders involving the immune mechanism: Secondary | ICD-10-CM

## 2024-02-15 DIAGNOSIS — I5042 Chronic combined systolic (congestive) and diastolic (congestive) heart failure: Secondary | ICD-10-CM

## 2024-02-15 DIAGNOSIS — I484 Atypical atrial flutter: Secondary | ICD-10-CM

## 2024-02-15 DIAGNOSIS — E7800 Pure hypercholesterolemia, unspecified: Secondary | ICD-10-CM

## 2024-02-15 DIAGNOSIS — Q221 Congenital pulmonary valve stenosis: Secondary | ICD-10-CM

## 2024-02-15 NOTE — Progress Notes (Signed)
 Patient ID: [name] Baird, female   DOB: [birth year], 89 y.o.   MRN: [id number]     Cardiology Office Note    Date:  02/15/2024   ID:  [name] Baird, DOB 01-15-1933, MRN [id number]  PCP:  Irven Coe, MD  Cardiologist:   Thurmon Fair, MD   Chief Complaint  Patient presents with   Atrial Flutter    History of Present Illness:  [name] Baird is a 89 y.o. female with a history of congenital heart disease (atrial septal defect status post patch closure), pulmonic stenosis (status post valvotomy 1985), left pulmonary artery aneurysm, complete heart block status post dual-chamber permanent pacemaker implantation (Medtronic, last generator change 2016) and infrequent paroxysmal atrial fibrillation (atrial flutter cavotricuspid isthmus ablation 2014). She has history of heart failure with mildly reduced ejection fraction with infrequent exacerbation, most recently following an episode of lower GI bleeding with severe anemia in March 2021 (Xarelto switched to Eliquis after that, but subsequently back on Xarelto since she did not feel well on Eliquis).  Symptoms of heart failure are often exacerbated during atrial arrhythmia, even in the absence of RVR.    She has had increasing shortness of breath and fatigue for the last approximately 4 days.  The remote download of her pacemaker demonstrated that her symptoms coincide with new onset persistent atrial flutter that began around 8 AM on 02/09/2024.  Except for the last few days she has not had problems with worsening dyspnea, lower extremity edema, fatigue, palpitations, syncope.  She does feel some abdominal bloating, which is her typical symptom of fluid retention, but her weight is at her baseline (estimated dry weight 158 pounds or so).  She has not had any recent falls since the event in January.  There has been no interruption in her anticoagulant.  She does not have chest pain.  No recent focal neurological complaints.  Atypical  atrial flutter has been a recurrent problem.  She had successful overdrive pacing of the arrhythmia in April 2023 with improvement in her symptoms of fatigue and reduced stamina.  We tried to do this again for a lengthy episode of atrial flutter in August 2023, unsuccessfully.  However, when she presented for cardioversion on September 27 she was back in normal rhythm spontaneously. She had DC CV for a prolonged 1 month episode on 12/25/2022.  It took her a while to wake up from the anesthetic.  About 48 hours after that she had symptoms of acute exacerbation of heart failure.  Because of the frequent PACs and recurrent atrial sustained arrhythmia atrial preference pacing is turned on and her heart rate histogram shows 2 peaks at 70 bpm and 90 bpm.  Otherwise pacemaker function is normal.  She has 75% atrial pacing and 99.8% ventricular pacing.  In the last 6 months there have been 3-4 episodes of brief atrial mode switch lasting for 4-8 hours, but the current episode is the only one it has been persistent and is now going on for over 5 days.  The overall burden of arrhythmia is about 4%..  Lead parameters (original leads from 2007, not MRI conditional) and generator voltage are within normal range (estimated device longevity about 29 months).  Over the last several years, estimation of LV systolic function has varied with ejection fractions between 35% and 45%.  Most recent echocardiogram from 10/26/2023 showed slight improvement in LVEF at 40-45% from August 2024, unchanged valve problems (mild MR, moderate TR, moderate to severe TR, mild pulmonic stenosis).  Apical wall motion abnormalities have been described on her studies but are likely related to pacing induced dyssynchrony, which is also the most likely reason for her LV dysfunction.  In May 2019 she saw Dr. Lowanda Foster Zwischenberger at [facility] to discuss treatment for pulmonary artery aneurysm.  She has a 6.4 cm left pulmonary artery aneurysm in the main  pulmonary artery is also mildly enlarged at 3.5 cm.  Conservative management was recommended.  Echo performed there showed a left ventricular ejection fraction of 45-50%, severe left atrial enlargement, moderate regurgitation of all 4 cardiac valves, in addition to the dilated pulmonary artery and left pulmonary artery aneurysm.   Echocardiogram 07/29/2023 showed further reduction in EF to 35-40%, after which heart failure medications were started.  Follow-up echo in November 2024 showed return to an EF of 40-45%.  She had atrial flutter ablation performed by Dr. Johney Frame in July 2014 . At the time of the EP study she was noted to have conventional counterclockwise isthmus-dependent right atrial flutter, but then a second type of right atrial flutter was noted briefly. It could not be reinduced for full study.  She has done poorly on conventional beta-blockers (atenolol, metoprolol), but tolerates Bystolic well.   Past Medical History:  Diagnosis Date   Acute GI bleeding 03/06/2020   Anxiety    Atrial flutter (HCC)    s/p ablation 06/29/2013 by Dr Johney Frame   CHF (congestive heart failure) (HCC)    Complete heart block (HCC) 2007   s/p medtronic pacemaker implantation   Depression    Glaucoma    Hypertension    Pulmonary stenosis sp valvotomy        Shortness of breath    Status post patch closure of ASD     Allergies:   Sulfa antibiotics and Hydrocodone   PHYSICAL EXAM:   VS:  BP 106/62 (BP Location: Left Arm, Patient Position: Sitting)   Pulse 77   Wt 157 lb (71.2 kg)   SpO2 93%   BMI 28.72 kg/m      General: Alert, oriented x3, no distress, healthy left subclavian pacemaker site.   Head: no evidence of trauma, blepharoplasty left eye  Neck: normal jugular venous pulsations and no hepatojugular reflux; brisk carotid pulses without delay and no carotid bruits Chest: clear to auscultation, no signs of consolidation by percussion or palpation, normal fremitus, symmetrical and full  respiratory excursions Cardiovascular: normal position and quality of the apical impulse, regular rhythm, normal first and second heart sounds, grade 1-2/6 ejection murmur heard best at left upper sternal border where there is also grade 1-2/6 diastolic decrescendo murmur, no apical murmurs, rubs or gallops Abdomen: no tenderness or distention, no masses by palpation, no abnormal pulsatility or arterial bruits, normal bowel sounds, no hepatosplenomegaly Extremities: no clubbing, cyanosis or edema; 2+ radial, ulnar and brachial pulses bilaterally; 2+ right femoral, posterior tibial and dorsalis pedis pulses; 2+ left femoral, posterior tibial and dorsalis pedis pulses; no subclavian or femoral bruits Neurological: grossly nonfocal Psych: Normal mood and affect   Wt Readings from Last 3 Encounters:  02/15/24 157 lb (71.2 kg)  01/11/24 162 lb 11.2 oz (73.8 kg)  08/26/23 159 lb 6.4 oz (72.3 kg)    Studies/Labs Reviewed:   ECHO 10/26/2023:    1. Akinesis of the distal inferior, distal septal and apical walls;  hypokinesis of the mid inferior and mid/distal lateral walls. . Left  ventricular ejection fraction, by estimation, is 40 to 45%. The left  ventricle has mildly decreased function.  There  is mild left ventricular hypertrophy. Left ventricular diastolic  parameters are indeterminate.   2. Right ventricular systolic function is low normal. The right  ventricular size is mildly enlarged.   3. Left atrial size was mild to moderately dilated.   4. Right atrial size was severely dilated.   5. The mitral valve is normal in structure. Mild mitral valve  regurgitation.   6. Tricuspid valve regurgitation is moderate.   7. The aortic valve is tricuspid. Aortic valve regurgitation is moderate.  Aortic valve sclerosis/calcification is present, without any evidence of  aortic stenosis.   8. Pulmonic valve is thickened with some resticted motion. DIfficult  angle to provide accurate hemodynamics.  . Pulmonic valve regurgitation is  moderate to severe.   9. Aortic dilatation noted. There is mild dilatation of the ascending  aorta, measuring 38 mm.  10. The inferior vena cava is normal in size with greater than 50%  respiratory variability, suggesting right atrial pressure of 3 mmHg.    EKG:    EKG Interpretation Date/Time:  Tuesday February 15 2024 10:16:05 EST Ventricular Rate:  77 PR Interval:    QRS Duration:  156 QT Interval:  458 QTC Calculation: 518 R Axis:   -76  Text Interpretation: Ventricular-paced rhythm with occasional Premature ventricular complexes When compared with ECG of 05-Jul-2023 15:40, Premature ventricular complexes are now Present Vent. rate has decreased BY  17 BPM Confirmed by Gabrille Kilbride ([id number]) on 02/15/2024 10:21:51 AM         Lipid Panel     Component Value Date/Time   CHOL 138 10/07/2021 0817   TRIG 144 10/07/2021 0817   HDL 53 10/07/2021 0817   CHOLHDL 2.2 06/03/2020 0918   LDLCALC 60 10/07/2021 0817   BMET    Component Value Date/Time   NA 141 08/13/2023 1542   K 5.2 08/13/2023 1542   K 4.2 09/13/2013 1609   CL 102 08/13/2023 1542   CO2 25 08/13/2023 1542   GLUCOSE 116 (H) 08/13/2023 1542   GLUCOSE 119 (H) 07/11/2022 1700   BUN 20 08/13/2023 1542   CREATININE 1.27 (H) 08/13/2023 1542   CREATININE 1.03 06/25/2015 1012   CALCIUM 9.6 08/13/2023 1542   GFRNONAA 39 (L) 07/11/2022 1700   GFRAA 51 (L) 10/03/2020 1029   04/07/2022 Cholesterol 128, HDL 51, LDL 50, triglycerides 159 Hemoglobin 13.2, creatinine 1.13, potassium 4.3, ALT 10  10/07/2021 Hemoglobin A1c 6.1%  ASSESSMENT:    1. Chronic combined systolic and diastolic heart failure (HCC)   2. Atypical atrial flutter (HCC)   3. CHB (complete heart block) (HCC)   4. Pacemaker   5. Acquired thrombophilia (HCC)   6. Hypercholesterolemia   7. Pulmonic stenosis, congenital   8. Aneurysm of left pulmonary artery (HCC)   9. History of repair of congenital atrial septal  defect (ASD)   10. History of iron deficiency anemia        PLAN:  In order of problems listed above:  CHF, combined systolic and diastolic: For many years she had borderline reduced LVEF attributable to RV pacing, but in August 2024 she developed more overt systolic dysfunction with an EF down to 35-40%.  Etiology is unclear.  The echo was interpreted as showing inferior wall motion abnormality, but on my review I simply think that there is inferoseptal dyssynchrony related to pacing.  She has never had angina pectoris and had normal coronaries at a previous cardiac catheterization and previous normal nuclear perfusion imaging studies.  On comprehensive  guideline directed medical therapy including Sherryll Burger, Bystolic, Farxiga, spironolactone with a slight improvement in LVEF.  Option for referral to CRT if LVEF comes severely depressed.   PAFlutter/ Afib: This is symptomatic when it persists for more than a few hours.  CHADSVasc4 (age 16, gender, HF).  On appropriate uninterrupted anticoagulation.  No missed doses.  Presented today with her usual atypical atrial flutter with an atrial cycle length around 360 ms.  Performed several attempts at overdrive pacing starting at 409 ms with gradually decreasing cycle lengths, able to overdrive pace from the atrium with a cycle length of 266 ms.  Initial rhythm was atrial paced, ventricular paced rhythm with very frequent premature atrial complexes, but she gradually settled back to AV sequential pacing at 70 bpm.  Atrial preference pacing is already on, pacing at 90 bpm almost half the time.  Option to start amiodarone if the episodes become more frequent. CHB: She is paced dependent.  Device function is normal.  She feels very poorly when we check underlying rhythm or perform threshold testing. PPM: Leads are from 2007 and still have normal parameters.  The leads are not MRI compatible.  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 Expect generator change  out in about 29 months.  Continue remote downloads every 3 months.  She has frequent atrial preference pacing at 90 bpm but this seems to be helping to prevent recurrent atypical atrial flutter. Anticoagulation: Dose adjusted for age and renal function.  No serious bleeding.  Had a fall in January, not preceded by dizziness or syncope.  Had a scalp hematoma but no intracranial hemorrhage. HLP: All lipid parameters are in the desirable range.  No known CAD or PAD. PS s/p surgical valvotomy:  Moderate regurgitation, no residual stenosis by echocardiogram.  She does not have any overt evidence of hypervolemia/right heart failure today. ASD s/p patch closure: No evidence of residual shunt on multiple echoes. L PA aneurysm: Quite large at 6.4 cm, but asymptomatic.  Plan conservative, non-surgical management.  No intention to pursue surgical repair.  Serial imaging not indicated. Anemia: She is off iron supplements for a while now and has not had anemia recurrence while on anticoagulants.   In office pacemaker check and atrial overdrive pacing: Comprehensive pacemaker check was performed today prior to overdrive pacing.  All generator and lead parameters are normal and documented in the print out.  Presenting rhythm was atrial flutter with a cycle length of roughly 360 ms and 100% ventricular pacing (complete heart block).  Atrial overdrive pacing was performed at multiple cycle lengths (with ventricular backup pacing), decreasing from 330 ms until successful interruption of the arrhythmia with a cycle length of 266 ms.  Initially there were very frequent premature atrial contractions, eventually the rhythm settled to AV sequential pacing at 70 bpm.  Informed Consent   Shared Decision Making/Informed Consent The risks (stroke, cardiac arrhythmias rarely resulting in the need for a temporary or permanent pacemaker, skin irritation or burns and complications associated with conscious sedation including  aspiration, arrhythmia, respiratory failure and death), benefits (restoration of normal sinus rhythm) and alternatives of a direct current cardioversion were explained in detail to Ms. Stirling and she agrees to proceed.         Medication Adjustments/Labs and Tests Ordered: Current medicines are reviewed at length with the patient today.  Concerns regarding medicines are outlined above.  Medication changes, Labs and Tests ordered today are listed in the Patient Instructions below. Patient Instructions  Medication Instructions:  No Changes *If you need a refill on your cardiac medications before your next appointment, please call your pharmacy*   Lab Work: No Labs If you have labs (blood work) drawn today and your tests are completely normal, you will receive your results only by: MyChart Message (if you have MyChart) OR A paper copy in the mail If you have any lab test that is abnormal or we need to change your treatment, we will call you to review the results.   Testing/Procedures: No Testing   Follow-Up: At [facility], you and your health needs are our priority.  As part of our continuing mission to provide you with exceptional heart care, we have created designated Provider Care Teams.  These Care Teams include your primary Cardiologist (physician) and Advanced Practice Providers (APPs -  Physician Assistants and Nurse Practitioners) who all work together to provide you with the care you need, when you need it.  We recommend signing up for the patient portal called "MyChart".  Sign up information is provided on this After Visit Summary.  MyChart is used to connect with patients for Virtual Visits (Telemedicine).  Patients are able to view lab/test results, encounter notes, upcoming appointments, etc.  Non-urgent messages can be sent to your provider as well.   To learn more about what you can do with MyChart, go to [email or web address].    Your next appointment:   6  month(s)  Provider:   Thurmon Fair, MD   Signed, Thurmon Fair, MD  02/15/2024 11:11 AM    [facility] [address] [place], [place], [place]  [address] Phone: [phone]; Fax: [phone]

## 2024-02-15 NOTE — Patient Instructions (Signed)
Medication Instructions:  No Changes *If you need a refill on your cardiac medications before your next appointment, please call your pharmacy*   Lab Work: No Labs If you have labs (blood work) drawn today and your tests are completely normal, you will receive your results only by: MyChart Message (if you have MyChart) OR A paper copy in the mail If you have any lab test that is abnormal or we need to change your treatment, we will call you to review the results.   Testing/Procedures: No Testing   Follow-Up: At [facility], you and your health needs are our priority.  As part of our continuing mission to provide you with exceptional heart care, we have created designated Provider Care Teams.  These Care Teams include your primary Cardiologist (physician) and Advanced Practice Providers (APPs -  Physician Assistants and Nurse Practitioners) who all work together to provide you with the care you need, when you need it.  We recommend signing up for the patient portal called "MyChart".  Sign up information is provided on this After Visit Summary.  MyChart is used to connect with patients for Virtual Visits (Telemedicine).  Patients are able to view lab/test results, encounter notes, upcoming appointments, etc.  Non-urgent messages can be sent to your provider as well.   To learn more about what you can do with MyChart, go to [email or web address].    Your next appointment:   6 month(s)  Provider:   Mihai Croitoru, MD   

## 2024-02-19 ENCOUNTER — Other Ambulatory Visit: Payer: Self-pay | Admitting: Cardiovascular Disease

## 2024-02-21 NOTE — Telephone Encounter (Signed)
 Prescription refill request for Xarelto received.  Indication:aflutter Last office visit:2/25 Weight:71.2  kg Age:89 Scr:1.27  8/24 CrCl:34.42  ml/min  Prescription refilled

## 2024-02-28 ENCOUNTER — Ambulatory Visit: Payer: Medicare Other

## 2024-02-28 DIAGNOSIS — I442 Atrioventricular block, complete: Secondary | ICD-10-CM

## 2024-02-29 ENCOUNTER — Encounter: Payer: Self-pay | Admitting: Cardiovascular Disease

## 2024-02-29 LAB — CUP PACEART REMOTE DEVICE CHECK
Battery Impedance: 1994 Ohm
Battery Remaining Longevity: 33 mo
Battery Voltage: 2.74 V
Brady Statistic AP VP Percent: 44 %
Brady Statistic AP VS Percent: 0 %
Brady Statistic AS VP Percent: 55 %
Brady Statistic AS VS Percent: 1 %
Date Time Interrogation Session: 20250310070139
Implantable Lead Connection Status: 753985
Implantable Lead Connection Status: 753985
Implantable Lead Implant Date: 20070920
Implantable Lead Implant Date: 20070920
Implantable Lead Location: 753859
Implantable Lead Location: 753860
Implantable Lead Model: 4592
Implantable Lead Model: 5092
Implantable Pulse Generator Implant Date: 20160707
Lead Channel Impedance Value: 549 Ohm
Lead Channel Impedance Value: 718 Ohm
Lead Channel Pacing Threshold Amplitude: 0.875 V
Lead Channel Pacing Threshold Amplitude: 1.375 V
Lead Channel Pacing Threshold Pulse Width: 0.4 ms
Lead Channel Pacing Threshold Pulse Width: 0.4 ms
Lead Channel Setting Pacing Amplitude: 2 V
Lead Channel Setting Pacing Amplitude: 2.75 V
Lead Channel Setting Pacing Pulse Width: 0.4 ms
Lead Channel Setting Sensing Sensitivity: 5.6 mV
Zone Setting Status: 755011
Zone Setting Status: 755011

## 2024-03-02 ENCOUNTER — Encounter: Payer: Medicare Other | Admitting: Cardiovascular Disease

## 2024-03-02 ENCOUNTER — Other Ambulatory Visit: Payer: Self-pay

## 2024-03-02 DIAGNOSIS — I484 Atypical atrial flutter: Secondary | ICD-10-CM

## 2024-03-02 DIAGNOSIS — Z79899 Other long term (current) drug therapy: Secondary | ICD-10-CM

## 2024-03-02 MED ORDER — AMIODARONE HCL 200 MG PO TABS: 400.0000 mg | ORAL_TABLET | Freq: Every day | ORAL | 2 refills | Status: DC

## 2024-03-02 NOTE — Telephone Encounter (Signed)
 Please keep her on the 200 mg tablets, but she is to take 2 of them daily for total of 400 mg daily.  Eventually, it is most likely that we will cut back to a lower dose, so I do not think she should fill 400 mg tablets.  Please send in a new prescription so that she has enough.

## 2024-03-02 NOTE — Progress Notes (Signed)
 Lab orders and prescription sent to pharmacy per Dr. Erin Hearing request

## 2024-03-06 ENCOUNTER — Telehealth: Payer: Self-pay | Admitting: *Deleted

## 2024-03-06 DIAGNOSIS — Z79899 Other long term (current) drug therapy: Secondary | ICD-10-CM

## 2024-03-06 NOTE — Telephone Encounter (Signed)
-----   Message from Tovey Croitoru sent at 02/29/2024  6:02 PM EDT ----- Remote reviewed.   Complete heart block: pacemaker dependent. Battery status is good. Lead measurements are stable. Heart rate histogram is favorable. Had atrial overdrive pacing for atrial flutter 02/15/2024, but atrial flutter recurred 02/18/2024 at about 6 AM and has been ongoing since.   I called and spoke to her daughter [name] Baird Please start amiodarone 200 mg two tablets daily.   Please perform another pacemaker download in roughly 2 weeks.   Please check a complete metabolic panel and TSH in roughly 2 weeks.   If still in atrial flutter at next download will schedule an office appointment and discuss further steps.

## 2024-03-06 NOTE — Telephone Encounter (Signed)
 pt daughter aware of results  New script sent to the pharmacy  Lab orders mailed to the pt

## 2024-03-09 ENCOUNTER — Encounter: Payer: Self-pay | Admitting: Cardiovascular Disease

## 2024-03-13 ENCOUNTER — Ambulatory Visit

## 2024-03-13 DIAGNOSIS — I442 Atrioventricular block, complete: Secondary | ICD-10-CM

## 2024-03-13 NOTE — Telephone Encounter (Signed)
 Please see note from Dr. Royann Shivers.

## 2024-03-13 NOTE — Telephone Encounter (Signed)
 Thank you! Can we please get her back in to see me in 2-4 weeks (give the amiodarone time to work)

## 2024-03-13 NOTE — Telephone Encounter (Signed)
 Remote to recheck atrial burden Persistent AF, overall controlled rates, burden 89.6%.  Routing to Dr. Royann Shivers as requested.

## 2024-03-14 LAB — COMPREHENSIVE METABOLIC PANEL
ALT: 12 IU/L (ref 0–32)
AST: 17 IU/L (ref 0–40)
Albumin: 3.9 g/dL (ref 3.7–4.7)
Alkaline Phosphatase: 81 IU/L (ref 44–121)
BUN/Creatinine Ratio: 13 (ref 12–28)
BUN: 24 mg/dL (ref 8–27)
Bilirubin Total: 1 mg/dL (ref 0.0–1.2)
CO2: 24 mmol/L (ref 20–29)
Calcium: 9.5 mg/dL (ref 8.7–10.3)
Chloride: 99 mmol/L (ref 96–106)
Creatinine, Ser: 1.81 mg/dL — ABNORMAL HIGH (ref 0.57–1.00)
Globulin, Total: 2.1 g/dL (ref 1.5–4.5)
Glucose: 121 mg/dL — ABNORMAL HIGH (ref 70–99)
Potassium: 4.1 mmol/L (ref 3.5–5.2)
Sodium: 137 mmol/L (ref 134–144)
Total Protein: 6 g/dL (ref 6.0–8.5)
eGFR: 27 mL/min/{1.73_m2} — ABNORMAL LOW (ref >59)

## 2024-03-14 LAB — TSH: TSH: 7.65 u[IU]/mL — ABNORMAL HIGH (ref 0.450–4.500)

## 2024-03-14 NOTE — Telephone Encounter (Signed)
 Spoke to patients daughter disucssing Amiodarone peak and the 4 week f/u in office.   Labs resulted this am and do show abnormal per pts daughter. Advised I would forward to Dr. Ernest Pine as a fyi.

## 2024-03-14 NOTE — Telephone Encounter (Signed)
 Still in flutter and the kidney tests are a little worse with the extra diuretic. There is a slot on Friday DOD at 3:20 PM. Can we add her on at that time please?

## 2024-03-14 NOTE — Telephone Encounter (Signed)
 Please see Dr. Royann Shivers note.

## 2024-03-15 ENCOUNTER — Telehealth: Payer: Self-pay | Admitting: Emergency Medicine

## 2024-03-15 NOTE — Telephone Encounter (Signed)
 Informed daughter (dpr) that Dr Royann Shivers would like to see the patient sooner than the appointment that was made. Appointment scheduled for 03/17/24.

## 2024-03-17 ENCOUNTER — Ambulatory Visit: Attending: Cardiovascular Disease | Admitting: Cardiovascular Disease

## 2024-03-17 VITALS — BP 64/62 | HR 76 | Ht 62.0 in | Wt 161.6 lb

## 2024-03-17 DIAGNOSIS — Z79899 Other long term (current) drug therapy: Secondary | ICD-10-CM

## 2024-03-17 DIAGNOSIS — Z950 Presence of cardiac pacemaker: Secondary | ICD-10-CM | POA: Diagnosis not present

## 2024-03-17 DIAGNOSIS — I484 Atypical atrial flutter: Secondary | ICD-10-CM

## 2024-03-17 DIAGNOSIS — D6869 Other thrombophilia: Secondary | ICD-10-CM

## 2024-03-17 DIAGNOSIS — E7800 Pure hypercholesterolemia, unspecified: Secondary | ICD-10-CM

## 2024-03-17 DIAGNOSIS — I442 Atrioventricular block, complete: Secondary | ICD-10-CM

## 2024-03-17 DIAGNOSIS — I5042 Chronic combined systolic (congestive) and diastolic (congestive) heart failure: Secondary | ICD-10-CM | POA: Diagnosis not present

## 2024-03-17 DIAGNOSIS — E032 Hypothyroidism due to medicaments and other exogenous substances: Secondary | ICD-10-CM

## 2024-03-17 DIAGNOSIS — E038 Other specified hypothyroidism: Secondary | ICD-10-CM

## 2024-03-17 MED ORDER — LEVOTHYROXINE SODIUM 50 MCG PO TABS: 50.0000 ug | ORAL_TABLET | Freq: Every day | ORAL | 3 refills | Status: DC

## 2024-03-17 NOTE — Patient Instructions (Addendum)
 Medication Instructions:  START Levothyroxine (Synthroid) 50 mcg daily *If you need a refill on your cardiac medications before your next appointment, please call your pharmacy*  Lab Work: CMP, TSH T4- Please get this done 1-2 days before your follow up appointment on 4/24 If you have labs (blood work) drawn today and your tests are completely normal, you will receive your results only by: MyChart Message (if you have MyChart) OR A paper copy in the mail If you have any lab test that is abnormal or we need to change your treatment, we will call you to review the results.  Follow-Up: At [facility], you and your health needs are our priority.  As part of our continuing mission to provide you with exceptional heart care, our providers are all part of one team.  This team includes your primary Cardiologist (physician) and Advanced Practice Providers or APPs (Physician Assistants and Nurse Practitioners) who all work together to provide you with the care you need, when you need it.  Your next appointment:    Already Scheduled appt 04/13/24 at 09:40  Provider:   Thurmon Fair, MD     We recommend signing up for the patient portal called "MyChart".  Sign up information is provided on this After Visit Summary.  MyChart is used to connect with patients for Virtual Visits (Telemedicine).  Patients are able to view lab/test results, encounter notes, upcoming appointments, etc.  Non-urgent messages can be sent to your provider as well.   To learn more about what you can do with MyChart, go to [email or web address].        1st Floor: - Lobby - Registration  - Pharmacy  - Lab - Cafe  2nd Floor: - PV Lab - Diagnostic Testing (echo, CT, nuclear med)  3rd Floor: - Vacant  4th Floor: - TCTS (cardiothoracic surgery) - AFib Clinic - Structural Heart Clinic - Vascular Surgery  - Vascular Ultrasound  5th Floor: - HeartCare Cardiology (general and EP) - Clinical Pharmacy for  coumadin, hypertension, lipid, weight-loss medications, and med management appointments    Valet parking services will be available as well.

## 2024-03-17 NOTE — Progress Notes (Signed)
 Patient ID: [name] Baird, female   DOB: 08/22/1933, 89 y.o.   MRN: [id number]     Cardiology Office Note    Date:  03/25/2024   ID:  [name] Baird, DOB 01-21-1933, MRN [id number]  PCP:  Irven Coe, MD  Cardiologist:   Thurmon Fair, MD   Chief Complaint  Patient presents with   Irregular Heart Beat    History of Present Illness:  [name] Baird is a 89 y.o. female with a history of congenital heart disease (atrial septal defect status post patch closure), pulmonic stenosis (status post valvotomy 1985), left pulmonary artery aneurysm, complete heart block status post dual-chamber permanent pacemaker implantation (Medtronic, last generator change 2016) and infrequent paroxysmal atrial fibrillation (atrial flutter cavotricuspid isthmus ablation 2014). She has history of heart failure with mildly reduced ejection fraction with infrequent exacerbation, most recently following an episode of lower GI bleeding with severe anemia in March 2021 (Xarelto switched to Eliquis after that, but subsequently back on Xarelto since she did not feel well on Eliquis).  Symptoms of heart failure are often exacerbated during atrial arrhythmia, even in the absence of RVR.  We have, over the years, successfully gotten rid of her atypical atrial flutter with either overdrive pacing or electrical cardioversion.  The arrhythmia continued to recur and we increased the amiodarone to 400 mg daily.  She most recently underwent overdrive pacing on 02/15/2024.  Despite the higher dose of amiodarone and atrial preference pacing, she only spent roughly 48 hours in normal rhythm and was back in atrial flutter for the last month or so.  We brought her back into the day to try again to overdrive pace the arrhythmia and found that she had just recently returned back to normal rhythm (AV sequential pacing) probably just earlier this morning.  New pacemaker download performed just 3 days ago showed that she was still  in atrial flutter.  Today she presents in AV sequentially paced rhythm.  It looks like the atrial flutter stopped within the last 24 hours or so.  Despite the normalization of the rhythm, there has been no clinical improvement yet.  Likely that she has atrial mechanical stunning at this point.She continues to have increased fatigue and slight breathlessness and worsened edema compared to her baseline.  It will probably take a few days or a couple weeks for the improved cardiac performance to take care of these problems.  Otherwise her pacemaker download shows normal findings.  Estimated generator longevity is 30 months (the current Medtronic Adapta ADD RL 1 device was implanted in 2016 but she still has the original Medtronic atrial 4592 and ventricular 5092 leads from 2007).  Presenting rhythm today with AV sequential pacing.  Statistically, since her last visit in late February she has roughly 50% atrial pacing (the rest is atrial flutter) and the 98.8 % ventricular pacing.  She is pacemaker dependent and has no underlying escape rhythm.  Although the parameters remain stable and in acceptable range.  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  She does feel some abdominal bloating, which is her typical symptom of fluid retention, but her weight is at her baseline (estimated dry weight 158 pounds or so).  She has not had any recent falls since the event in January.  There has been no interruption in her anticoagulant.  She does not have chest pain.  No recent focal neurological complaints.  Atypical atrial flutter has been a recurrent problem.  She had successful overdrive pacing  of the arrhythmia in April 2023 with improvement in her symptoms of fatigue and reduced stamina.  We tried to do this again for a lengthy episode of atrial flutter in August 2023, unsuccessfully.  However, when she presented for cardioversion on September 27 she was back in normal rhythm spontaneously. She  had DC CV for a prolonged 1 month episode on 12/25/2022.  It took her a while to wake up from the anesthetic.  About 48 hours after that she had symptoms of acute exacerbation of heart failure.  Because of the frequent PACs and recurrent atrial sustained arrhythmia atrial preference pacing is turned on and her heart rate histogram shows 2 peaks at 70 bpm and 90 bpm.  Otherwise pacemaker function is normal.  She has 75% atrial pacing and 99.8% ventricular pacing.  In the last 6 months there have been 3-4 episodes of brief atrial mode switch lasting for 4-8 hours, but the current episode is the only one it has been persistent and is now going on for over 5 days.  The overall burden of arrhythmia is about 4%..  Lead parameters (original leads from 2007, not MRI conditional) and generator voltage are within normal range (estimated device longevity about 29 months).  Over the last several years, estimation of LV systolic function has varied with ejection fractions between 35% and 45%.  Most recent echocardiogram from 10/26/2023 showed slight improvement in LVEF at 40-45% from August 2024, unchanged valve problems (mild MR, moderate TR, moderate to severe TR, mild pulmonic stenosis).  Apical wall motion abnormalities have been described on her studies but are likely related to pacing induced dyssynchrony, which is also the most likely reason for her LV dysfunction.  In May 2019 she saw Dr. Lowanda Foster Zwischenberger at [facility] to discuss treatment for pulmonary artery aneurysm.  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  Conservative management was recommended.  Echo performed there showed a left ventricular ejection fraction of 45-50%, severe left atrial enlargement, moderate regurgitation of all 4 cardiac valves, in addition to the dilated pulmonary artery and left pulmonary artery aneurysm.   Echocardiogram 07/29/2023 showed further reduction in EF  to 35-40%, after which heart failure medications were started.  Follow-up echo in November 2024 showed return to an EF of 40-45%.  She had atrial flutter ablation performed by Dr. Johney Frame in July 2014 . At the time of the EP study she was noted to have conventional counterclockwise isthmus-dependent right atrial flutter, but then a second type of right atrial flutter was noted briefly. It could not be reinduced for full study.  She has done poorly on conventional beta-blockers (atenolol, metoprolol), but tolerates Bystolic well.   Past Medical History:  Diagnosis Date   Acute GI bleeding 03/06/2020   Anxiety    Atrial flutter (HCC)    s/p ablation 06/29/2013 by Dr Johney Frame   CHF (congestive heart failure) (HCC)    Complete heart block (HCC) 2007   s/p medtronic pacemaker implantation   Depression    Glaucoma    Hypertension    Pulmonary stenosis sp valvotomy        Shortness of breath    Status post patch closure of ASD     Allergies:   Sulfa antibiotics and Hydrocodone   PHYSICAL EXAM:   VS:  BP (!) 64/62 (BP Location: Left Arm, Patient Position: Sitting, Cuff Size: Normal)   Pulse 76   Ht 5\' 2"  (  1.575 m)   Wt 161 lb 9.6 oz (73.3 kg)   SpO2 94%   BMI 29.56 kg/m      General: Alert, oriented x3, no distress, healthy left subclavian pacemaker site.   Head: no evidence of trauma, blepharoplasty left eye  Neck: normal jugular venous pulsations and no hepatojugular reflux; brisk carotid pulses without delay and no carotid bruits Chest: clear to auscultation, no signs of consolidation by percussion or palpation, normal fremitus, symmetrical and full respiratory excursions Cardiovascular: normal position and quality of the apical impulse, regular rhythm, normal first and second heart sounds, grade 1-2/6 ejection murmur heard best at left upper sternal border where there is also grade 1-2/6 diastolic decrescendo murmur, no apical murmurs, rubs or gallops Abdomen: no tenderness or  distention, no masses by palpation, no abnormal pulsatility or arterial bruits, normal bowel sounds, no hepatosplenomegaly Extremities: no clubbing, cyanosis or edema; 2+ radial, ulnar and brachial pulses bilaterally; 2+ right femoral, posterior tibial and dorsalis pedis pulses; 2+ left femoral, posterior tibial and dorsalis pedis pulses; no subclavian or femoral bruits Neurological: grossly nonfocal Psych: Normal mood and affect   Wt Readings from Last 3 Encounters:  03/17/24 161 lb 9.6 oz (73.3 kg)  02/15/24 157 lb (71.2 kg)  01/11/24 162 lb 11.2 oz (73.8 kg)    Studies/Labs Reviewed:   ECHO 10/26/2023:    1. Akinesis of the distal inferior, distal septal and apical walls;  hypokinesis of the mid inferior and mid/distal lateral walls. . Left  ventricular ejection fraction, by estimation, is 40 to 45%. The left  ventricle has mildly decreased function. There  is mild left ventricular hypertrophy. Left ventricular diastolic  parameters are indeterminate.   2. Right ventricular systolic function is low normal. The right  ventricular size is mildly enlarged.   3. Left atrial size was mild to moderately dilated.   4. Right atrial size was severely dilated.   5. The mitral valve is normal in structure. Mild mitral valve  regurgitation.   6. Tricuspid valve regurgitation is moderate.   7. The aortic valve is tricuspid. Aortic valve regurgitation is moderate.  Aortic valve sclerosis/calcification is present, without any evidence of  aortic stenosis.   8. Pulmonic valve is thickened with some resticted motion. DIfficult  angle to provide accurate hemodynamics. . Pulmonic valve regurgitation is  moderate to severe.   9. Aortic dilatation noted. There is mild dilatation of the ascending  aorta, measuring 38 mm.  10. The inferior vena cava is normal in size with greater than 50%  respiratory variability, suggesting right atrial pressure of 3 mmHg.    EKG:    EKG  Interpretation Date/Time:  Friday March 17 2024 13:36:40 EDT Ventricular Rate:  76 PR Interval:  168 QRS Duration:  208 QT Interval:  514 QTC Calculation: 578 R Axis:   -83  Text Interpretation: AV dual-paced rhythm When compared with ECG of 15-Feb-2024 10:16, Premature ventricular complexes are no longer Present Confirmed by Makila Colombe ([id number]) on 03/17/2024 2:02:57 PM         Lipid Panel     Component Value Date/Time   CHOL 138 10/07/2021 0817   TRIG 144 10/07/2021 0817   HDL 53 10/07/2021 0817   CHOLHDL 2.2 06/03/2020 0918   LDLCALC 60 10/07/2021 0817   BMET    Component Value Date/Time   NA 137 03/13/2024 0901   K 4.1 03/13/2024 0901   K 4.2 09/13/2013 1609   CL 99 03/13/2024 0901   CO2 24  03/13/2024 0901   GLUCOSE 121 (H) 03/13/2024 0901   GLUCOSE 119 (H) 07/11/2022 1700   BUN 24 03/13/2024 0901   CREATININE 1.81 (H) 03/13/2024 0901   CREATININE 1.03 06/25/2015 1012   CALCIUM 9.5 03/13/2024 0901   GFRNONAA 39 (L) 07/11/2022 1700   GFRAA 51 (L) 10/03/2020 1029   04/07/2022 Cholesterol 128, HDL 51, LDL 50, triglycerides 159 Hemoglobin 13.2, creatinine 1.13, potassium 4.3, ALT 10  10/07/2021 Hemoglobin A1c 6.1%  ASSESSMENT:    1. Chronic combined systolic and diastolic heart failure (HCC)   2. Medication management   3. Atypical atrial flutter (HCC)   4. CHB (complete heart block) (HCC)   5. Pacemaker   6. Acquired thrombophilia (HCC)   7. Hypercholesterolemia   8. Hypothyroidism due to medication        PLAN:  In order of problems listed above:  CHF, combined systolic and diastolic: NYHA functional class III and mildly hypervolemic today.  For many years she had borderline reduced LVEF attributable to RV pacing, but in August 2024 she developed more overt systolic dysfunction with an EF down to 35-40%.  Etiology is unclear.  The echo was interpreted as showing inferior wall motion abnormality, but on my review I simply think that there is  inferoseptal dyssynchrony related to pacing.  She has never had angina pectoris and had normal coronaries at a previous cardiac catheterization and previous normal nuclear perfusion imaging studies.  On comprehensive guideline directed medical therapy including Entresto, Bystolic, Farxiga, spironolactone with a slight improvement in LVEF.  Option for referral to CRT if LVEF comes severely depressed.   PAFlutter/ Afib: This is symptomatic when it persists for more than a few hours and she has spent the better part of the last 2 months and uninterrupted atrial flutter, except for a couple of days after her last office visit and overdrive pacing.  She has spontaneously returned to normal rhythm in the last 24 hours, probably due to the higher dose of amiodarone.  CHADSVasc4 (age 65, gender, HF).  On appropriate uninterrupted anticoagulation.  No missed doses.   CHB: pacemaker dependent, no underlying escape rhythm, tolerates threshold testing very poorly. PPM: Leads are from 2007 and still have normal parameters.  The leads are not MRI compatible.  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 Expect generator change out in about 2-1/2 years.  Continue remote downloads every 3 months.  When in normal rhythm she tends to have very frequent atrial pressures pacing at 90 bpm.  However, this is preferable to the decompensation that she suffers with loss of "atrial kick". Anticoagulation: Had a fall in January and required treatment for scalp hematoma, but no falls since.  Compliant with anticoagulation without interruption. HLP: All lipid parameters are in the desirable range.  No known CAD or PAD. Hypothyroidism: Likely related to amiodarone therapy.  Will start levothyroxine and check another TSH level when she comes back for an appointment in roughly a month. PS s/p surgical valvotomy:  Moderate regurgitation, no residual stenosis by echocardiogram.  She does not have any overt evidence of hypervolemia/right  heart failure today. ASD s/p patch closure: No evidence of residual shunt on multiple echoes. L PA aneurysm: Quite large at 6.4 cm, but asymptomatic.  Plan conservative, non-surgical management.  No intention to pursue surgical repair.  Serial imaging not indicated. Anemia: She is off iron supplements for a while now and has not had anemia recurrence while on anticoagulants.   Informed Consent   Shared Decision Making/Informed  Consent The risks (stroke, cardiac arrhythmias rarely resulting in the need for a temporary or permanent pacemaker, skin irritation or burns and complications associated with conscious sedation including aspiration, arrhythmia, respiratory failure and death), benefits (restoration of normal sinus rhythm) and alternatives of a direct current cardioversion were explained in detail to [name] Baird and she agrees to proceed.         Medication Adjustments/Labs and Tests Ordered: Current medicines are reviewed at length with the patient today.  Concerns regarding medicines are outlined above.  Medication changes, Labs and Tests ordered today are listed in the Patient Instructions below. Patient Instructions  Medication Instructions:  START Levothyroxine (Synthroid) 50 mcg daily *If you need a refill on your cardiac medications before your next appointment, please call your pharmacy*  Lab Work: CMP, TSH T4- Please get this done 1-2 days before your follow up appointment on 4/24 If you have labs (blood work) drawn today and your tests are completely normal, you will receive your results only by: MyChart Message (if you have MyChart) OR A paper copy in the mail If you have any lab test that is abnormal or we need to change your treatment, we will call you to review the results.  Follow-Up: At [facility], you and your health needs are our priority.  As part of our continuing mission to provide you with exceptional heart care, our providers are all part of one team.   This team includes your primary Cardiologist (physician) and Advanced Practice Providers or APPs (Physician Assistants and Nurse Practitioners) who all work together to provide you with the care you need, when you need it.  Your next appointment:    Already Scheduled appt 04/13/24 at 09:40  Provider:   Thurmon Fair, MD     We recommend signing up for the patient portal called "MyChart".  Sign up information is provided on this After Visit Summary.  MyChart is used to connect with patients for Virtual Visits (Telemedicine).  Patients are able to view lab/test results, encounter notes, upcoming appointments, etc.  Non-urgent messages can be sent to your provider as well.   To learn more about what you can do with MyChart, go to [email or web address].        1st Floor: - Lobby - Registration  - Pharmacy  - Lab - Cafe  2nd Floor: - PV Lab - Diagnostic Testing (echo, CT, nuclear med)  3rd Floor: - Vacant  4th Floor: - TCTS (cardiothoracic surgery) - AFib Clinic - Structural Heart Clinic - Vascular Surgery  - Vascular Ultrasound  5th Floor: - HeartCare Cardiology (general and EP) - Clinical Pharmacy for coumadin, hypertension, lipid, weight-loss medications, and med management appointments    Valet parking services will be available as well.     Signed, Thurmon Fair, MD  03/25/2024 9:16 PM    [facility], [facility], [facility] [address] [place], [place], [place]  [address] Phone: [phone]; Fax: [phone]

## 2024-03-25 ENCOUNTER — Encounter: Payer: Self-pay | Admitting: Cardiovascular Disease

## 2024-03-27 ENCOUNTER — Encounter: Payer: Self-pay | Admitting: Cardiovascular Disease

## 2024-03-27 NOTE — Telephone Encounter (Signed)
 Spoke with device nurse, they have accessed the down load and I will make dr croitoru aware so he can review.

## 2024-03-28 NOTE — Telephone Encounter (Signed)
 Please reduce the amiodarone to 200 mg daily.

## 2024-03-30 NOTE — Progress Notes (Signed)
 Remote pacemaker transmission.

## 2024-03-30 NOTE — Addendum Note (Signed)
 Addended by: Geralyn Flash D on: 03/30/2024 03:07 PM   Modules accepted: Orders

## 2024-03-31 NOTE — Telephone Encounter (Signed)
 Also stop the rosuvastatin please.

## 2024-03-31 NOTE — Telephone Encounter (Signed)
 Just stop the amiodarone please

## 2024-04-06 ENCOUNTER — Encounter: Payer: Self-pay | Admitting: Cardiovascular Disease

## 2024-04-06 NOTE — Telephone Encounter (Signed)
 OK to wait on labs until the 4/24 visit.

## 2024-04-13 ENCOUNTER — Ambulatory Visit: Attending: Cardiovascular Disease | Admitting: Cardiovascular Disease

## 2024-04-13 VITALS — BP 112/66 | HR 94 | Ht 62.0 in | Wt 157.4 lb

## 2024-04-13 DIAGNOSIS — I5042 Chronic combined systolic (congestive) and diastolic (congestive) heart failure: Secondary | ICD-10-CM | POA: Diagnosis not present

## 2024-04-13 DIAGNOSIS — I480 Paroxysmal atrial fibrillation: Secondary | ICD-10-CM | POA: Diagnosis not present

## 2024-04-13 DIAGNOSIS — Z862 Personal history of diseases of the blood and blood-forming organs and certain disorders involving the immune mechanism: Secondary | ICD-10-CM

## 2024-04-13 DIAGNOSIS — I442 Atrioventricular block, complete: Secondary | ICD-10-CM

## 2024-04-13 DIAGNOSIS — D6869 Other thrombophilia: Secondary | ICD-10-CM

## 2024-04-13 DIAGNOSIS — Z950 Presence of cardiac pacemaker: Secondary | ICD-10-CM

## 2024-04-13 DIAGNOSIS — E032 Hypothyroidism due to medicaments and other exogenous substances: Secondary | ICD-10-CM

## 2024-04-13 DIAGNOSIS — I370 Nonrheumatic pulmonary valve stenosis: Secondary | ICD-10-CM

## 2024-04-13 DIAGNOSIS — I281 Aneurysm of pulmonary artery: Secondary | ICD-10-CM

## 2024-04-13 DIAGNOSIS — I484 Atypical atrial flutter: Secondary | ICD-10-CM

## 2024-04-13 DIAGNOSIS — E7800 Pure hypercholesterolemia, unspecified: Secondary | ICD-10-CM

## 2024-04-13 NOTE — Progress Notes (Unsigned)
 Patient ID: [name] Baird, female   DOB: 02/15/1933, 89 y.o.   MRN: [id number]     Cardiology Office Note    Date:  04/14/2024   ID:  [name] Baird, DOB 06-27-1933, MRN [id number]  PCP:  Benedetto Brady, MD  Cardiologist:   Luana Rumple, MD   Chief Complaint  Patient presents with   Atrial Fibrillation    History of Present Illness:  [name] Baird is a 89 y.o. female with a history of congenital heart disease (atrial septal defect status post patch closure), pulmonic stenosis (status post valvotomy 1985), left pulmonary artery aneurysm, complete heart block status post dual-chamber permanent pacemaker implantation (Medtronic, last generator change 2016) and infrequent paroxysmal atrial fibrillation (atrial flutter cavotricuspid isthmus ablation 2014). She has history of heart failure with mildly reduced ejection fraction with infrequent exacerbation, most recently following an episode of lower GI bleeding with severe anemia in March 2021 (Xarelto  switched to Eliquis  after that, but subsequently back on Xarelto  since she did not feel well on Eliquis ).  Symptoms of heart failure are often exacerbated during atrial arrhythmia, even in the absence of RVR.  We have, over the years, successfully gotten rid of her atypical atrial flutter with either overdrive pacing or electrical cardioversion.  The arrhythmia continued to recur and we increased the amiodarone  to 400 mg daily.  She most recently underwent overdrive pacing on 02/15/2024.  Despite the higher dose of amiodarone  and atrial preference pacing, she only spent roughly 48 hours in normal rhythm and was back in atrial flutter for the last month or so.  We brought her back to the office on 03/17/2024 and found that she had just recently returned back to normal rhythm (AV sequential pacing) probably just earlier this morning.  Despite maintaining normal rhythm since then her functional status steadily did creased on the higher dose  of amiodarone  she became more more lethargic, less interactive, more groggy and her appetite deteriorated.  She had difficulty even getting out of a chair.  We decreased the dose of amiodarone  to 200 mg daily without improvement.  Eventually we stopped the amiodarone  altogether and she has begun to feel better.  She is more interactive and more mobile.  Thankfully she is still in normal rhythm.  It looks like we will have to abandon antiarrhythmic therapy with amiodarone .  Check to see if there was any reason that the medication would have been deleterious due to interaction with other drugs, but there is no obvious problem with her other medications.  Talk to her family who agree that they do not want the antiarrhythmic "cure" to be worse than the arrhythmia problem.  May have to eventually resigned her cells to permanent atrial arrhythmia.  Pacemaker download shows normal findings.  There has been no evidence of atrial arrhythmia since 03/15/2024.  She is pacemaker dependent.  Estimated generator longevity is 24 months, which is surprising since just a month ago it was estimated at 30 months (the current Medtronic Adapta ADD RL 1 device was implanted in 2016 but she still has the original Medtronic atrial 4592 and ventricular 5092 leads from 2007).  Presenting rhythm today with AV sequential pacing.  Since resolution of atrial flutter she has virtually 100% AV sequential pacing.  She is pacemaker dependent and has no underlying escape rhythm.  All lead parameters remain stable and in acceptable range.  After adjusting her levothyroxine  dose her TSH is all normal range at 4.180.  She has not had falls or  bleeding problems.  Most recent hemoglobin was normal at 13.1.  Glycemic control is good with a hemoglobin A1c of 6.2% and her LDL is excellent at 28.  Triglycerides are minimally elevated at 163.  Creatinine is 1.81 on 03/29/2024, substantially higher than her previous baseline around 1.2-1.3.  Atypical  atrial flutter has been a recurrent problem.  She had successful overdrive pacing of the arrhythmia in April 2023 with improvement in her symptoms of fatigue and reduced stamina.  We tried to do this again for a lengthy episode of atrial flutter in August 2023, unsuccessfully.  However, when she presented for cardioversion on September 27 she was back in normal rhythm spontaneously. She had DC CV for a prolonged 1 month episode on 12/25/2022.  It took her a while to wake up from the anesthetic.  About 48 hours after that she had symptoms of acute exacerbation of heart failure.  In 2025 she again had problems with atypical atrial flutter that resolved with higher doses of amiodarone , but this resulted in unacceptable fatigue, lethargy, muscle weakness so the amiodarone  was eventually discontinued  Because of the frequent PACs and recurrent atrial sustained arrhythmia atrial preference pacing is turned on and her heart rate histogram shows 2 peaks at 70 bpm and 90 bpm.    Over the last several years, estimation of LV systolic function has varied with ejection fractions between 35% and 45%.  Most recent echocardiogram from 10/26/2023 showed slight improvement in LVEF at 40-45% from August 2024, unchanged valve problems (mild MR, moderate TR, moderate to severe TR, mild pulmonic stenosis).  Apical wall motion abnormalities have been described on her studies but are likely related to pacing induced dyssynchrony, which is also the most likely reason for her LV dysfunction.  In May 2019 she saw Dr. Illene Malm Zwischenberger at [facility] to discuss treatment for pulmonary artery aneurysm.  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  Conservative management was recommended.  Echo performed there showed a left ventricular ejection fraction of 45-50%, severe left atrial enlargement, moderate regurgitation of all 4 cardiac valves, in addition to the dilated  pulmonary artery and left pulmonary artery aneurysm.   Echocardiogram 07/29/2023 showed further reduction in EF to 35-40%, after which heart failure medications were started.  Follow-up echo in November 2024 showed return to an EF of 40-45%.  She had atrial flutter ablation performed by Dr. Nunzio Belch in July 2014 . At the time of the EP study she was noted to have conventional counterclockwise isthmus-dependent right atrial flutter, but then a second type of right atrial flutter was noted briefly. It could not be reinduced for full study.  She has done poorly on conventional beta-blockers (atenolol, metoprolol), but tolerates Bystolic  well.   Past Medical History:  Diagnosis Date   Acute GI bleeding 03/06/2020   Anxiety    Atrial flutter (HCC)    s/p ablation 06/29/2013 by Dr Nunzio Belch   CHF (congestive heart failure) (HCC)    Complete heart block (HCC) 2007   s/p medtronic pacemaker implantation   Depression    Glaucoma    Hypertension    Pulmonary stenosis sp valvotomy        Shortness of breath    Status post patch closure of ASD     Allergies:   Hydrocodone, Sulfa antibiotics, and Sulfur   PHYSICAL EXAM:   VS:  BP 112/66 (BP Location: Left Arm, Patient Position: Sitting)  Pulse 94   Wt 157 lb 6.4 oz (71.4 kg)   SpO2 94%   BMI 28.79 kg/m      General: Alert, oriented x3, no distress, healthy left subclavian pacemaker site.   Head: no evidence of trauma, blepharoplasty left eye  Neck: normal jugular venous pulsations and no hepatojugular reflux; brisk carotid pulses without delay and no carotid bruits Chest: clear to auscultation, no signs of consolidation by percussion or palpation, normal fremitus, symmetrical and full respiratory excursions Cardiovascular: normal position and quality of the apical impulse, regular rhythm, normal first and second heart sounds, grade 1-2/6 ejection murmur heard best at left upper sternal border where there is also grade 1-2/6 diastolic decrescendo  murmur, no apical murmurs, rubs or gallops Abdomen: no tenderness or distention, no masses by palpation, no abnormal pulsatility or arterial bruits, normal bowel sounds, no hepatosplenomegaly Extremities: no clubbing, cyanosis or edema; 2+ radial, ulnar and brachial pulses bilaterally; 2+ right femoral, posterior tibial and dorsalis pedis pulses; 2+ left femoral, posterior tibial and dorsalis pedis pulses; no subclavian or femoral bruits Neurological: grossly nonfocal Psych: Normal mood and affect   Wt Readings from Last 3 Encounters:  04/13/24 157 lb 6.4 oz (71.4 kg)  03/17/24 161 lb 9.6 oz (73.3 kg)  02/15/24 157 lb (71.2 kg)    Studies/Labs Reviewed:   ECHO 10/26/2023:    1. Akinesis of the distal inferior, distal septal and apical walls;  hypokinesis of the mid inferior and mid/distal lateral walls. . Left  ventricular ejection fraction, by estimation, is 40 to 45%. The left  ventricle has mildly decreased function. There  is mild left ventricular hypertrophy. Left ventricular diastolic  parameters are indeterminate.   2. Right ventricular systolic function is low normal. The right  ventricular size is mildly enlarged.   3. Left atrial size was mild to moderately dilated.   4. Right atrial size was severely dilated.   5. The mitral valve is normal in structure. Mild mitral valve  regurgitation.   6. Tricuspid valve regurgitation is moderate.   7. The aortic valve is tricuspid. Aortic valve regurgitation is moderate.  Aortic valve sclerosis/calcification is present, without any evidence of  aortic stenosis.   8. Pulmonic valve is thickened with some resticted motion. DIfficult  angle to provide accurate hemodynamics. . Pulmonic valve regurgitation is  moderate to severe.   9. Aortic dilatation noted. There is mild dilatation of the ascending  aorta, measuring 38 mm.  10. The inferior vena cava is normal in size with greater than 50%  respiratory variability, suggesting right  atrial pressure of 3 mmHg.    EKG: The intracardiac electrogram shows AV sequential pacing, uninterrupted since 03/15/2024.  EKG Interpretation Date/Time:    Ventricular Rate:    PR Interval:    QRS Duration:    QT Interval:    QTC Calculation:   R Axis:      Text Interpretation:           Lipid Panel     Component Value Date/Time   CHOL 138 10/07/2021 0817   TRIG 144 10/07/2021 0817   HDL 53 10/07/2021 0817   CHOLHDL 2.2 06/03/2020 0918   LDLCALC 60 10/07/2021 0817      Latest Ref Rng & Units 03/13/2024    9:01 AM 08/13/2023    3:42 PM 07/05/2023    4:42 PM  BMP  Glucose 70 - 99 mg/dL 454  098  119   BUN 8 - 27 mg/dL 24  20  14   Creatinine 0.57 - 1.00 mg/dL 1.61  0.96  0.45   BUN/Creat Ratio 12 - 28 13  16  11    Sodium 134 - 144 mmol/L 137  141  145   Potassium 3.5 - 5.2 mmol/L 4.1  5.2  5.1   Chloride 96 - 106 mmol/L 99  102  101   CO2 20 - 29 mmol/L 24  25  25    Calcium  8.7 - 10.3 mg/dL 9.5  9.6  9.9      40/98/1191 Creatinine 1.81, potassium 4.1, ALT 12  03/29/2024 Cholesterol 94, HDL 39, LDL 28, triglycerides 163 Hemoglobin A1c 6.2% Hemoglobin 13.1 TSH 4.180  ASSESSMENT:    1. Chronic combined systolic and diastolic heart failure (HCC)   2. Paroxysmal atrial fibrillation (HCC)   3. Atypical atrial flutter (HCC)   4. CHB (complete heart block) (HCC)   5. Pacemaker   6. Acquired thrombophilia (HCC)   7. Hypercholesterolemia   8. Hypothyroidism due to medication   9. Nonrheumatic pulmonary valve stenosis   10. Aneurysm of left pulmonary artery (HCC)   11. History of iron deficiency anemia         PLAN:  In order of problems listed above:  CHF, combined systolic and diastolic: Clinically she appears to be euvolemic today.  She has had NYHA functional class III for a while.    For many years she had borderline reduced LVEF attributable to RV pacing, but in August 2024 she developed more overt systolic dysfunction with an EF down to 35-40%.   Etiology is unclear.  The echo was interpreted as showing inferior wall motion abnormality, but on my review I simply think that there is inferoseptal dyssynchrony related to pacing.  She has never had angina pectoris and had normal coronaries at a previous cardiac catheterization and previous normal nuclear perfusion imaging studies.  On comprehensive guideline directed medical therapy including Entresto , Bystolic , Farxiga , spironolactone  with a slight improvement in LVEF.  Option for referral to CRT if LVEF becomes severely depressed.   PAFlutter/ Afib: This has been a recurrent problem for several years and usually becomes symptomatic when it is persistent.  After increasing her dose of amiodarone  to 2 400 mg daily the arrhythmia resolved on 03/15/2024, but clinically she did poorly on that dose of amiodarone  with groggy mental status, impaired cognition, r reduction in interactivity, difficulty moving.  There was also evidence of iatrogenic hypothyroidism.  TSH has improved with levothyroxine  supplementation, but the other problems have not.  After discussion with her family we decided to completely stop the amiodarone .  CHADSVasc4 (age 5, gender, HF).  On appropriate, uninterrupted anticoagulation.   CHB: pacemaker dependent, no underlying escape rhythm, tolerates threshold testing very poorly. PPM: Normal device function.  Currently AV sequential paced.  Pacemaker dependent.  Leads are from 2007 and still have normal parameters.  The leads are not MRI compatible.  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  Dissipate need for had a fall in generator change out in about 2 years..  Continue remote downloads every 3 months.  When in normal rhythm she tends to have very frequent atrial pressures pacing at 90 bpm.  However, this is preferable to the decompensation that she suffers with loss of "atrial kick". Anticoagulation: Has not had any falls since January when she required treatment for scalp  hematoma.  No other bleeding problems. HLP: All lipid parameters are in the desirable range.  No known CAD or PAD. Hypothyroidism: Iatrogenic/amiodarone  related, improved  with levothyroxine  supplementation.  It is possible that a lot of her adverse symptoms were related to hypothyroidism, but they did not appear to resolve just with levothyroxine  supplementation, improved when we stopped the amiodarone .   PS s/p surgical valvotomy:  Moderate regurgitation, no residual stenosis by echocardiogram.  She does not have any overt evidence of hypervolemia/right heart failure today. ASD s/p patch closure: No evidence of residual shunt on multiple echoes. L PA aneurysm: We are not performing serial imaging since there is no intention to pursue surgical repair.  Quite large at 6.4 cm, but asymptomatic.  Plan conservative, non-surgical management.   Anemia: Iron supplements have been stopped for several months and hemoglobin remained stable around 13.  No overt bleeding problems.   Informed Consent   Shared Decision Making/Informed Consent The risks (stroke, cardiac arrhythmias rarely resulting in the need for a temporary or permanent pacemaker, skin irritation or burns and complications associated with conscious sedation including aspiration, arrhythmia, respiratory failure and death), benefits (restoration of normal sinus rhythm) and alternatives of a direct current cardioversion were explained in detail to Ms. Heuer and she agrees to proceed.         Medication Adjustments/Labs and Tests Ordered: Current medicines are reviewed at length with the patient today.  Concerns regarding medicines are outlined above.  Medication changes, Labs and Tests ordered today are listed in the Patient Instructions below. Patient Instructions  Medication Instructions:  No changes *If you need a refill on your cardiac medications before your next appointment, please call your pharmacy*  Follow-Up: At [facility], you and your health needs are our priority.  As part of our continuing mission to provide you with exceptional heart care, our providers are all part of one team.  This team includes your primary Cardiologist (physician) and Advanced Practice Providers or APPs (Physician Assistants and Nurse Practitioners) who all work together to provide you with the care you need, when you need it.  Your next appointment:   4-5 month(s)  Provider:   Luana Rumple, MD     We recommend signing up for the patient portal called "MyChart".  Sign up information is provided on this After Visit Summary.  MyChart is used to connect with patients for Virtual Visits (Telemedicine).  Patients are able to view lab/test results, encounter notes, upcoming appointments, etc.  Non-urgent messages can be sent to your provider as well.   To learn more about what you can do with MyChart, go to [email or web address].        1st Floor: - Lobby - Registration  - Pharmacy  - Lab - Cafe  2nd Floor: - PV Lab - Diagnostic Testing (echo, CT, nuclear med)  3rd Floor: - Vacant  4th Floor: - TCTS (cardiothoracic surgery) - AFib Clinic - Structural Heart Clinic - Vascular Surgery  - Vascular Ultrasound  5th Floor: - HeartCare Cardiology (general and EP) - Clinical Pharmacy for coumadin, hypertension, lipid, weight-loss medications, and med management appointments    Valet parking services will be available as well.     Signed, Luana Rumple, MD  04/14/2024 7:01 PM    [facility] [address] [place], [place], [place]  [address] Phone: [phone]; Fax: [phone]

## 2024-04-13 NOTE — Patient Instructions (Signed)
 Medication Instructions:  No changes *If you need a refill on your cardiac medications before your next appointment, please call your pharmacy*  Follow-Up: At [facility], you and your health needs are our priority.  As part of our continuing mission to provide you with exceptional heart care, our providers are all part of one team.  This team includes your primary Cardiologist (physician) and Advanced Practice Providers or APPs (Physician Assistants and Nurse Practitioners) who all work together to provide you with the care you need, when you need it.  Your next appointment:   4-5 month(s)  Provider:   Luana Rumple, MD     We recommend signing up for the patient portal called "MyChart".  Sign up information is provided on this After Visit Summary.  MyChart is used to connect with patients for Virtual Visits (Telemedicine).  Patients are able to view lab/test results, encounter notes, upcoming appointments, etc.  Non-urgent messages can be sent to your provider as well.   To learn more about what you can do with MyChart, go to [email or web address].        1st Floor: - Lobby - Registration  - Pharmacy  - Lab - Cafe  2nd Floor: - PV Lab - Diagnostic Testing (echo, CT, nuclear med)  3rd Floor: - Vacant  4th Floor: - TCTS (cardiothoracic surgery) - AFib Clinic - Structural Heart Clinic - Vascular Surgery  - Vascular Ultrasound  5th Floor: - HeartCare Cardiology (general and EP) - Clinical Pharmacy for coumadin, hypertension, lipid, weight-loss medications, and med management appointments    Valet parking services will be available as well.

## 2024-04-14 ENCOUNTER — Encounter: Payer: Self-pay | Admitting: Cardiovascular Disease

## 2024-04-21 ENCOUNTER — Encounter: Payer: Self-pay | Admitting: Cardiovascular Disease

## 2024-04-22 ENCOUNTER — Other Ambulatory Visit: Payer: Self-pay | Admitting: Cardiovascular Disease

## 2024-04-27 NOTE — Progress Notes (Signed)
 Remote pacemaker transmission.

## 2024-05-03 ENCOUNTER — Encounter: Payer: Self-pay | Admitting: Cardiovascular Disease

## 2024-05-04 NOTE — Telephone Encounter (Signed)
 [name] Baird

## 2024-05-29 ENCOUNTER — Ambulatory Visit (INDEPENDENT_AMBULATORY_CARE_PROVIDER_SITE_OTHER): Payer: Medicare Other

## 2024-05-29 DIAGNOSIS — I442 Atrioventricular block, complete: Secondary | ICD-10-CM

## 2024-05-30 LAB — CUP PACEART REMOTE DEVICE CHECK
Battery Impedance: 2044 Ohm
Battery Remaining Longevity: 25 mo
Battery Voltage: 2.72 V
Brady Statistic AP VP Percent: 95 %
Brady Statistic AP VS Percent: 0 %
Brady Statistic AS VP Percent: 5 %
Brady Statistic AS VS Percent: 0 %
Date Time Interrogation Session: 20250609060223
Implantable Lead Connection Status: 753985
Implantable Lead Connection Status: 753985
Implantable Lead Implant Date: 20070920
Implantable Lead Implant Date: 20070920
Implantable Lead Location: 753859
Implantable Lead Location: 753860
Implantable Lead Model: 4592
Implantable Lead Model: 5092
Implantable Pulse Generator Implant Date: 20160707
Lead Channel Impedance Value: 519 Ohm
Lead Channel Impedance Value: 745 Ohm
Lead Channel Pacing Threshold Amplitude: 0.875 V
Lead Channel Pacing Threshold Amplitude: 1.625 V
Lead Channel Pacing Threshold Pulse Width: 0.4 ms
Lead Channel Pacing Threshold Pulse Width: 0.4 ms
Lead Channel Setting Pacing Amplitude: 2 V
Lead Channel Setting Pacing Amplitude: 3.25 V
Lead Channel Setting Pacing Pulse Width: 0.4 ms
Lead Channel Setting Sensing Sensitivity: 5.6 mV
Zone Setting Status: 755011
Zone Setting Status: 755011

## 2024-06-12 ENCOUNTER — Ambulatory Visit: Payer: Self-pay | Admitting: Cardiovascular Disease

## 2024-06-15 ENCOUNTER — Other Ambulatory Visit: Payer: Self-pay | Admitting: Cardiovascular Disease

## 2024-07-11 NOTE — Addendum Note (Signed)
 Addended by: TAWNI DRILLING D on: 07/11/2024 04:14 PM   Modules accepted: Orders

## 2024-07-11 NOTE — Progress Notes (Signed)
 Remote pacemaker transmission.

## 2024-07-23 ENCOUNTER — Other Ambulatory Visit: Payer: Self-pay | Admitting: Physician Assistant

## 2024-08-18 ENCOUNTER — Encounter (HOSPITAL_BASED_OUTPATIENT_CLINIC_OR_DEPARTMENT_OTHER): Payer: Self-pay

## 2024-08-21 NOTE — Progress Notes (Unsigned)
 Patient ID: [name] Baird, female   DOB: 02/10/1933, 89 y.o.   MRN: [id number]     Cardiology Office Note    Date:  08/22/2024   ID:  [name] Baird, DOB [birth year], MRN [id number]  PCP:  [name] Cole, MD  Cardiologist:   [name] Balding, MD   Chief Complaint  Patient presents with   Irregular Heart Beat    History of Present Illness:  [name] Baird is a 89 y.o. female with a history of congenital heart disease (atrial septal defect status post patch closure), pulmonic stenosis (status post valvotomy 1985), left pulmonary artery aneurysm, complete heart block status post dual-chamber permanent pacemaker implantation (Medtronic, last generator change 2016) and infrequent paroxysmal atrial fibrillation (atrial flutter cavotricuspid isthmus ablation 2014). She has history of heart failure with mildly reduced ejection fraction with infrequent exacerbation, most recently following an episode of lower GI bleeding with severe anemia in March 2021 (Xarelto  switched to Eliquis  after that, but subsequently back on Xarelto  since she did not feel well on Eliquis ).  Symptoms of heart failure are often exacerbated during atrial arrhythmia, even in the absence of RVR.  We have, over the years, successfully gotten rid of her atypical atrial flutter with either overdrive pacing or electrical cardioversion.  The arrhythmia continued to recur and we increased the amiodarone  to 400 mg daily.  She most recently underwent overdrive pacing on 02/15/2024.  She continued to have frequent late episodes of persistent atrial fibrillation and we increased her dose of amiodarone  to 400 mg daily leading to grogginess and lethargy, less interactivity with her family, reduction in appetite and eventually difficulty even standing up from a chair.  We stopped the amiodarone  altogether and there was gradual improvement in her clinical status.  Paradoxically, there is also been a substantial reduction in the burden of  atrial fibrillation which has been only 4% in the last roughly 4 months.  The last meaningful episode of atrial arrhythmia was a 30-minute event in early June.  Her pacemaker is programmed with pacing at a lower rate limit of 70 bpm and atrial preference pacing, leading to overall atrial pacing 90 at 96.3%.  She has complete heart block and 100% ventricular pacing (pacemaker dependent, no escape rhythm).  There have been no episodes of high ventricular rate.  The heart rate histogram is shifted to the right due to atrial preference pacing.  The current Medtronic Adapta ADD RL 1 device was implanted in 2016 but she still has the original Medtronic atrial 4592 and ventricular 5092 leads from 2007).  Battery longevity is estimated at 27 months and lead parameters are normal.  Clear evidence of sinus beats with appropriate sensing are seen on device interrogation today, but due to PACs, atrial preference pacing takes over promptly.  After adjusting her levothyroxine  dose her TSH is all normal range at 4.180.  She has not had falls or bleeding problems.  Most recent hemoglobin was normal at 13.1.  Glycemic control is good with a hemoglobin A1c of 6.2% and her LDL is excellent at 28.  Triglycerides are minimally elevated at 163.  Creatinine is 1.81 on 03/29/2024, substantially higher than her previous baseline around 1.2-1.3.  Atypical atrial flutter has been a recurrent problem.  She had successful overdrive pacing of the arrhythmia in April 2023 with improvement in her symptoms of fatigue and reduced stamina.  We tried to do this again for a lengthy episode of atrial flutter in August 2023, unsuccessfully.  However, when she presented  for cardioversion on September 27 she was back in normal rhythm spontaneously. She had DC CV for a prolonged 1 month episode on 12/25/2022.  It took her a while to wake up from the anesthetic.  About 48 hours after that she had symptoms of acute exacerbation of heart failure.  In 2025  she again had problems with atypical atrial flutter that resolved with higher doses of amiodarone , but this resulted in unacceptable fatigue, lethargy, muscle weakness so the amiodarone  was eventually discontinued  Because of the frequent PACs and recurrent atrial sustained arrhythmia atrial preference pacing is turned on and her heart rate histogram shows 2 peaks at 70 bpm and 90 bpm.    Over the last several years, estimation of LV systolic function has varied with ejection fractions between 35% and 45%.  Most recent echocardiogram from 10/26/2023 showed slight improvement in LVEF at 40-45% from August 2024, unchanged valve problems (mild MR, moderate TR, moderate to severe TR, mild pulmonic stenosis).  Apical wall motion abnormalities have been described on her studies but are likely related to pacing induced dyssynchrony, which is also the most likely reason for her LV dysfunction.  In May 2019 she saw Dr. Laymon Baird at [facility] to discuss treatment for pulmonary artery aneurysm.  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  Conservative management was recommended.  Echo performed there showed a left ventricular ejection fraction of 45-50%, severe left atrial enlargement, moderate regurgitation of all 4 cardiac valves, in addition to the dilated pulmonary artery and left pulmonary artery aneurysm.   Echocardiogram 07/29/2023 showed further reduction in EF to 35-40%, after which heart failure medications were started.  Follow-up echo in November 2024 showed return to an EF of 40-45%.  She had atrial flutter ablation performed by Dr. Kelsie in July 2014 . At the time of the EP study she was noted to have conventional counterclockwise isthmus-dependent right atrial flutter, but then a second type of right atrial flutter was noted briefly. It could not be reinduced for full study.  She has done poorly on conventional beta-blockers  (atenolol, metoprolol), but tolerates Bystolic  well.   Past Medical History:  Diagnosis Date   Acute GI bleeding 03/06/2020   Anxiety    Atrial flutter (HCC)    s/p ablation 06/29/2013 by Dr [name] Baird   CHF (congestive heart failure) (HCC)    Complete heart block (HCC) 2007   s/p medtronic pacemaker implantation   Depression    Glaucoma    Hypertension    Pulmonary stenosis sp valvotomy        Shortness of breath    Status post patch closure of ASD     Allergies:   Hydrocodone, Sulfa antibiotics, and Sulfur   PHYSICAL EXAM:   VS:  BP 104/66 (BP Location: Left Arm, Patient Position: Sitting, Cuff Size: Normal)   Pulse 83   Ht 5' 2 (1.575 m)   Wt 150 lb (68 kg)   SpO2 98%   BMI 27.44 kg/m      General: Alert, oriented x3, no distress, appears well Head: Right blepharoplasty, PERRL, EOMI, no exophtalmos or lid lag, no myxedema, no xanthelasma; normal ears, nose and oropharynx Neck: normal jugular venous pulsations and no hepatojugular reflux; brisk carotid pulses without delay and no carotid bruits Chest: clear to auscultation, no signs of consolidation by percussion or palpation, normal fremitus, symmetrical and full respiratory excursions Cardiovascular: normal position and quality of  the apical impulse, regular rhythm, normal first and second heart sounds, no  rubs or gallops.  Systolic and diastolic murmurs are heard at the left upper sternal border. Abdomen: no tenderness or distention, no masses by palpation, no abnormal pulsatility or arterial bruits, normal bowel sounds, no hepatosplenomegaly Extremities: no clubbing, cyanosis or edema; 2+ radial, ulnar and brachial pulses bilaterally; 2+ right femoral, posterior tibial and dorsalis pedis pulses; 2+ left femoral, posterior tibial and dorsalis pedis pulses; no subclavian or femoral bruits Neurological: grossly nonfocal Psych: Normal mood and affect    Wt Readings from Last 3 Encounters:  08/22/24 150 lb (68 kg)   04/13/24 157 lb 6.4 oz (71.4 kg)  03/17/24 161 lb 9.6 oz (73.3 kg)    Studies/Labs Reviewed:   ECHO 10/26/2023:    1. Akinesis of the distal inferior, distal septal and apical walls;  hypokinesis of the mid inferior and mid/distal lateral walls. . Left  ventricular ejection fraction, by estimation, is 40 to 45%. The left  ventricle has mildly decreased function. There  is mild left ventricular hypertrophy. Left ventricular diastolic  parameters are indeterminate.   2. Right ventricular systolic function is low normal. The right  ventricular size is mildly enlarged.   3. Left atrial size was mild to moderately dilated.   4. Right atrial size was severely dilated.   5. The mitral valve is normal in structure. Mild mitral valve  regurgitation.   6. Tricuspid valve regurgitation is moderate.   7. The aortic valve is tricuspid. Aortic valve regurgitation is moderate.  Aortic valve sclerosis/calcification is present, without any evidence of  aortic stenosis.   8. Pulmonic valve is thickened with some resticted motion. DIfficult  angle to provide accurate hemodynamics. . Pulmonic valve regurgitation is  moderate to severe.   9. Aortic dilatation noted. There is mild dilatation of the ascending  aorta, measuring 38 mm.  10. The inferior vena cava is normal in size with greater than 50%  respiratory variability, suggesting right atrial pressure of 3 mmHg.    EKG: The intracardiac electrogram shows atrial sensed-ventricular paced rhythm, then atrial preference pacing with AP-VP rhythm  EKG Interpretation Date/Time:    Ventricular Rate:    PR Interval:    QRS Duration:    QT Interval:    QTC Calculation:   R Axis:      Text Interpretation:           Lipid Panel     Component Value Date/Time   CHOL 138 10/07/2021 0817   TRIG 144 10/07/2021 0817   HDL 53 10/07/2021 0817   CHOLHDL 2.2 06/03/2020 0918   LDLCALC 60 10/07/2021 0817      Latest Ref Rng & Units 03/13/2024     9:01 AM 08/13/2023    3:42 PM 07/05/2023    4:42 PM  BMP  Glucose 70 - 99 mg/dL 878  883  899   BUN 8 - 27 mg/dL 24  20  14    Creatinine 0.57 - 1.00 mg/dL 8.18  8.72  8.72   BUN/Creat Ratio 12 - 28 13  16  11    Sodium 134 - 144 mmol/L 137  141  145   Potassium 3.5 - 5.2 mmol/L 4.1  5.2  5.1   Chloride 96 - 106 mmol/L 99  102  101   CO2 20 - 29 mmol/L 24  25  25    Calcium  8.7 - 10.3 mg/dL 9.5  9.6  9.9      96/75/7974  Creatinine 1.81, potassium 4.1, ALT 12  03/29/2024 Cholesterol 94, HDL 39, LDL 28, triglycerides 163 Hemoglobin A1c 6.2% Hemoglobin 13.1 TSH 4.180  ASSESSMENT:    1. Chronic combined systolic and diastolic heart failure (HCC)   2. Paroxysmal atrial fibrillation (HCC)   3. CHB (complete heart block) (HCC)   4. Pacemaker   5. Acquired thrombophilia (HCC)   6. Hypercholesterolemia   7. Hypothyroidism due to medication   8. Pulmonic stenosis, congenital   9. Pulmonary insufficiency   10. Aneurysm of left pulmonary artery (HCC)   11. History of iron deficiency anemia          PLAN:  In order of problems listed above:  CHF, combined systolic and diastolic: Clinically euvolemic with improved functional status but seems to be related to maintenance of normal rhythm.    For many years she had borderline reduced LVEF attributable to RV pacing, but in August 2024 she developed more overt systolic dysfunction with an EF down to 35-40%.  Etiology is unclear.  The echo was interpreted as showing inferior wall motion abnormality, but on my review I simply think that there is inferoseptal dyssynchrony related to pacing.  She has never had angina pectoris and had normal coronaries at a previous cardiac catheterization and previous normal nuclear perfusion imaging studies.  On comprehensive guideline directed medical therapy including Entresto , Bystolic , Farxiga , spironolactone  with a slight improvement in LVEF.  There are no options to increase the heart failure medications  due to her low blood pressure.  Option for referral to CRT-P upgrade if LVEF becomes severely depressed.  Will recheck an echocardiogram before her next office appointment in about 6 months. PAFlutter/ Afib: This has been a recurrent problem for several years and usually becomes symptomatic when it is persistent.  She did not tolerate high doses of amiodarone  and due to side effects we have discontinued that medication altogether.  After discussion with her family we decided to completely stop the amiodarone  in mid April 2025.  CHADSVasc4 (age 87, gender, HF).  She is on on appropriate, uninterrupted anticoagulation.   CHB: pacemaker dependent, no underlying escape rhythm, tolerates threshold testing very poorly. PPM: Normal device function.  Currently occasionally a sensed-V paced, but mostly AV sequential paced.  Pacemaker dependent.  Leads are from 2007 and still have normal parameters.  The leads are not MRI compatible.  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  Dissipate need for had a fall in generator change out in about 2 years.  Continue remote downloads every 3 months.  When in normal rhythm she tends to have very frequent atrial preference pacing at 90 bpm.  Made the APP intervention a little less aggressive today.  However, this is preferable to the decompensation that she suffers with loss of atrial kick. Anticoagulation: Had a fall with a small scalp hematoma in January, no problems with falls or other bleeding events since then. HLP: All lipid parameters are in the desirable range.  No known CAD or PAD. Hypothyroidism: Iatrogenic/amiodarone  related, improved with levothyroxine  supplementation.  It is possible that a lot of her adverse symptoms were related to hypothyroidism, but they did not appear to resolve just with levothyroxine  supplementation, improved when we stopped the amiodarone .   PS s/p surgical valvotomy:  Moderate regurgitation, no residual stenosis by echocardiogram.   She does not have any overt evidence of hypervolemia/right heart failure today.  Recheck echocardiogram before the end of the year. ASD s/p patch closure: No evidence of residual shunt on  multiple echoes. L PA aneurysm: We are not performing serial imaging since there is no intention to pursue surgical repair.  Quite large at 6.4 cm, but asymptomatic.  Plan conservative, non-surgical management.   Anemia: Iron supplements have been stopped for several months and hemoglobin remained stable around 13.  No overt bleeding problems.      Medication Adjustments/Labs and Tests Ordered: Current medicines are reviewed at length with the patient today.  Concerns regarding medicines are outlined above.  Medication changes, Labs and Tests ordered today are listed in the Patient Instructions below. Patient Instructions  Medication Instructions:  Your physician recommends that you continue on your current medications as directed. Please refer to the Current Medication list given to you today.  *If you need a refill on your cardiac medications before your next appointment, please call your pharmacy*   Follow-Up: At [facility], you and your health needs are our priority.  As part of our continuing mission to provide you with exceptional heart care, our providers are all part of one team.  This team includes your primary Cardiologist (physician) and Advanced Practice Providers or APPs (Physician Assistants and Nurse Practitioners) who all work together to provide you with the care you need, when you need it.  Your next appointment:   6 month(s)  Provider:   Jerel Balding, MD    Signed, [name] Balding, MD  08/22/2024 9:45 AM    [facility] [address] [employer], [place], [place]  [address] Phone: [phone]; Fax: [phone]

## 2024-08-22 ENCOUNTER — Ambulatory Visit: Attending: Cardiovascular Disease | Admitting: Cardiovascular Disease

## 2024-08-22 ENCOUNTER — Encounter: Payer: Self-pay | Admitting: Cardiovascular Disease

## 2024-08-22 VITALS — BP 104/66 | HR 83 | Ht 62.0 in | Wt 150.0 lb

## 2024-08-22 DIAGNOSIS — D6869 Other thrombophilia: Secondary | ICD-10-CM

## 2024-08-22 DIAGNOSIS — I5042 Chronic combined systolic (congestive) and diastolic (congestive) heart failure: Secondary | ICD-10-CM

## 2024-08-22 DIAGNOSIS — I281 Aneurysm of pulmonary artery: Secondary | ICD-10-CM

## 2024-08-22 DIAGNOSIS — E7800 Pure hypercholesterolemia, unspecified: Secondary | ICD-10-CM

## 2024-08-22 DIAGNOSIS — I442 Atrioventricular block, complete: Secondary | ICD-10-CM

## 2024-08-22 DIAGNOSIS — Q221 Congenital pulmonary valve stenosis: Secondary | ICD-10-CM

## 2024-08-22 DIAGNOSIS — I480 Paroxysmal atrial fibrillation: Secondary | ICD-10-CM | POA: Diagnosis not present

## 2024-08-22 DIAGNOSIS — Z950 Presence of cardiac pacemaker: Secondary | ICD-10-CM | POA: Diagnosis not present

## 2024-08-22 DIAGNOSIS — E032 Hypothyroidism due to medicaments and other exogenous substances: Secondary | ICD-10-CM

## 2024-08-22 DIAGNOSIS — J984 Other disorders of lung: Secondary | ICD-10-CM

## 2024-08-22 DIAGNOSIS — Z862 Personal history of diseases of the blood and blood-forming organs and certain disorders involving the immune mechanism: Secondary | ICD-10-CM

## 2024-08-22 NOTE — Patient Instructions (Signed)
 Medication Instructions:  Your physician recommends that you continue on your current medications as directed. Please refer to the Current Medication list given to you today.  *If you need a refill on your cardiac medications before your next appointment, please call your pharmacy*   Follow-Up: At [facility], you and your health needs are our priority.  As part of our continuing mission to provide you with exceptional heart care, our providers are all part of one team.  This team includes your primary Cardiologist (physician) and Advanced Practice Providers or APPs (Physician Assistants and Nurse Practitioners) who all work together to provide you with the care you need, when you need it.  Your next appointment:   6 month(s)  Provider:   Jerel Balding, MD

## 2024-08-22 NOTE — Addendum Note (Signed)
 Addended by: Farah Lepak L on: 08/22/2024 10:00 AM   Modules accepted: Orders

## 2024-08-23 ENCOUNTER — Other Ambulatory Visit: Payer: Self-pay | Admitting: Physician Assistant

## 2024-08-23 ENCOUNTER — Telehealth: Payer: Self-pay | Admitting: Cardiovascular Disease

## 2024-08-23 MED ORDER — DAPAGLIFLOZIN PROPANEDIOL 10 MG PO TABS: 10.0000 mg | ORAL_TABLET | Freq: Every day | ORAL | 3 refills | Status: AC

## 2024-08-23 NOTE — Telephone Encounter (Signed)
*  STAT* If patient is at the pharmacy, call can be transferred to refill team.     1. Which medications need to be refilled? (please list name of each medication and dose if known) dapagliflozin  propanediol (FARXIGA ) 10 MG TABS tablet      2. Would you like to learn more about the convenience, safety, & potential cost savings by using the [facility]? No      3. Are you open to using the [facility] (Type [facility]. No     4. Which pharmacy/location (including street and city if local pharmacy) is medication to be sent to? [id number] [place], [place] - [address]       5. Do they need a 30 day or 90 day supply? 90  Patient is out of medication

## 2024-08-23 NOTE — Telephone Encounter (Signed)
 Pt's medication was sent to pt's pharmacy as requested. Confirmation received.

## 2024-08-28 ENCOUNTER — Ambulatory Visit: Payer: Medicare Other

## 2024-08-28 DIAGNOSIS — I442 Atrioventricular block, complete: Secondary | ICD-10-CM | POA: Diagnosis not present

## 2024-08-29 LAB — CUP PACEART REMOTE DEVICE CHECK
Battery Impedance: 2023 Ohm
Battery Remaining Longevity: 29 mo
Battery Voltage: 2.72 V
Brady Statistic AP VP Percent: 74 %
Brady Statistic AP VS Percent: 2 %
Brady Statistic AS VP Percent: 24 %
Brady Statistic AS VS Percent: 1 %
Date Time Interrogation Session: 20250908065341
Implantable Lead Connection Status: 753985
Implantable Lead Connection Status: 753985
Implantable Lead Implant Date: 20070920
Implantable Lead Implant Date: 20070920
Implantable Lead Location: 753859
Implantable Lead Location: 753860
Implantable Lead Model: 4592
Implantable Lead Model: 5092
Implantable Pulse Generator Implant Date: 20160707
Lead Channel Impedance Value: 524 Ohm
Lead Channel Impedance Value: 757 Ohm
Lead Channel Pacing Threshold Amplitude: 0.625 V
Lead Channel Pacing Threshold Amplitude: 1.375 V
Lead Channel Pacing Threshold Pulse Width: 0.4 ms
Lead Channel Pacing Threshold Pulse Width: 0.4 ms
Lead Channel Setting Pacing Amplitude: 2 V
Lead Channel Setting Pacing Amplitude: 2.75 V
Lead Channel Setting Pacing Pulse Width: 0.4 ms
Lead Channel Setting Sensing Sensitivity: 5.6 mV
Zone Setting Status: 755011
Zone Setting Status: 755011

## 2024-08-31 ENCOUNTER — Ambulatory Visit: Payer: Self-pay | Admitting: Cardiovascular Disease

## 2024-08-31 ENCOUNTER — Other Ambulatory Visit: Payer: Self-pay | Admitting: Cardiovascular Disease

## 2024-09-07 NOTE — Progress Notes (Signed)
 Remote PPM Transmission

## 2024-09-14 ENCOUNTER — Encounter: Payer: Self-pay | Admitting: Cardiovascular Disease

## 2024-09-14 NOTE — Telephone Encounter (Signed)
 I think getting the shots separately is a fine idea.  I think the RSV vaccine is also recommended due to monos numerous health problems.  I have not heard reports of lots of side effects from the new RSV vaccine, at least not more so than with the flu shot.

## 2024-10-04 ENCOUNTER — Telehealth: Payer: Self-pay | Admitting: Cardiovascular Disease

## 2024-10-04 MED ORDER — NEBIVOLOL HCL 10 MG PO TABS: 10.0000 mg | ORAL_TABLET | Freq: Every day | ORAL | 3 refills | Status: AC

## 2024-10-04 NOTE — Telephone Encounter (Signed)
 Pt's medication was sent to pt's pharmacy as requested. Confirmation received.

## 2024-10-04 NOTE — Telephone Encounter (Signed)
*  STAT* If patient is at the pharmacy, call can be transferred to refill team.   1. Which medications need to be refilled? (please list name of each medication and dose if known) nebivolol  (BYSTOLIC ) 10 MG tablet   2. Which pharmacy/location (including street and city if local pharmacy) is medication to be sent to?  [id number] [place], [place] - [address]    3. Do they need a 30 day or 90 day supply? 90

## 2024-10-23 ENCOUNTER — Other Ambulatory Visit: Payer: Self-pay | Admitting: Cardiovascular Disease

## 2024-10-29 ENCOUNTER — Other Ambulatory Visit: Payer: Self-pay | Admitting: Cardiovascular Disease

## 2024-10-29 DIAGNOSIS — I480 Paroxysmal atrial fibrillation: Secondary | ICD-10-CM

## 2024-11-27 ENCOUNTER — Ambulatory Visit: Payer: Medicare Other

## 2024-11-28 LAB — CUP PACEART REMOTE DEVICE CHECK
Battery Impedance: 2065 Ohm
Battery Remaining Longevity: 27 mo
Battery Voltage: 2.71 V
Brady Statistic AP VP Percent: 76 %
Brady Statistic AP VS Percent: 0 %
Brady Statistic AS VP Percent: 24 %
Brady Statistic AS VS Percent: 0 %
Date Time Interrogation Session: 20251208070746
Implantable Lead Connection Status: 753985
Implantable Lead Connection Status: 753985
Implantable Lead Implant Date: 20070920
Implantable Lead Implant Date: 20070920
Implantable Lead Location: 753859
Implantable Lead Location: 753860
Implantable Lead Model: 4592
Implantable Lead Model: 5092
Implantable Pulse Generator Implant Date: 20160707
Lead Channel Impedance Value: 533 Ohm
Lead Channel Impedance Value: 729 Ohm
Lead Channel Pacing Threshold Amplitude: 0.75 V
Lead Channel Pacing Threshold Amplitude: 1.5 V
Lead Channel Pacing Threshold Pulse Width: 0.4 ms
Lead Channel Pacing Threshold Pulse Width: 0.4 ms
Lead Channel Setting Pacing Amplitude: 2 V
Lead Channel Setting Pacing Amplitude: 3 V
Lead Channel Setting Pacing Pulse Width: 0.4 ms
Lead Channel Setting Sensing Sensitivity: 5.6 mV
Zone Setting Status: 755011
Zone Setting Status: 755011

## 2024-12-05 NOTE — Progress Notes (Signed)
 Remote PPM Transmission

## 2024-12-16 ENCOUNTER — Ambulatory Visit: Payer: Self-pay | Admitting: Cardiovascular Disease

## 2024-12-18 ENCOUNTER — Ambulatory Visit: Payer: Self-pay | Admitting: Cardiovascular Disease

## 2024-12-18 ENCOUNTER — Ambulatory Visit (HOSPITAL_COMMUNITY)
Admission: RE | Admit: 2024-12-18 | Discharge: 2024-12-18 | Disposition: A | Discharge status: Home / Self Care | Source: Ambulatory Visit | Attending: Internal Medicine | Admitting: Internal Medicine

## 2024-12-18 DIAGNOSIS — E7800 Pure hypercholesterolemia, unspecified: Secondary | ICD-10-CM | POA: Insufficient documentation

## 2024-12-18 DIAGNOSIS — J984 Other disorders of lung: Secondary | ICD-10-CM | POA: Insufficient documentation

## 2024-12-18 DIAGNOSIS — I480 Paroxysmal atrial fibrillation: Secondary | ICD-10-CM | POA: Insufficient documentation

## 2024-12-18 DIAGNOSIS — I5042 Chronic combined systolic (congestive) and diastolic (congestive) heart failure: Secondary | ICD-10-CM | POA: Insufficient documentation

## 2024-12-18 LAB — ECHOCARDIOGRAM COMPLETE
Area-P 1/2: 3.6 cm2
Est EF: 50
P 1/2 time: 360 ms
S' Lateral: 3.3 cm

## 2025-02-20 ENCOUNTER — Ambulatory Visit: Admitting: Cardiovascular Disease
